# Patient Record
Sex: Female | Born: 1965 | Race: Black or African American | Hispanic: No | Smoking: Never smoker
Health system: Southern US, Community
[De-identification: ages and names within clinical notes are randomized; demographics above are authoritative.]

## PROBLEM LIST (undated history)

## (undated) DIAGNOSIS — I10 Essential (primary) hypertension: Secondary | ICD-10-CM

## (undated) DIAGNOSIS — I071 Rheumatic tricuspid insufficiency: Secondary | ICD-10-CM

## (undated) DIAGNOSIS — I471 Supraventricular tachycardia, unspecified: Secondary | ICD-10-CM

## (undated) DIAGNOSIS — I351 Nonrheumatic aortic (valve) insufficiency: Secondary | ICD-10-CM

## (undated) DIAGNOSIS — K219 Gastro-esophageal reflux disease without esophagitis: Secondary | ICD-10-CM

## (undated) DIAGNOSIS — I340 Nonrheumatic mitral (valve) insufficiency: Secondary | ICD-10-CM

## (undated) HISTORY — PX: DILATION AND CURETTAGE OF UTERUS: SHX78

---

## 1999-07-26 ENCOUNTER — Emergency Department (HOSPITAL_COMMUNITY): Admission: EM | Admit: 1999-07-26 | Discharge: 1999-07-26 | Payer: Self-pay | Admitting: *Deleted

## 1999-08-16 ENCOUNTER — Encounter: Admission: RE | Admit: 1999-08-16 | Discharge: 1999-11-14 | Payer: Self-pay | Admitting: Obstetrics & Gynecology

## 2000-03-11 ENCOUNTER — Inpatient Hospital Stay (HOSPITAL_COMMUNITY): Admission: AD | Admit: 2000-03-11 | Discharge: 2000-03-14 | Payer: Self-pay | Admitting: Obstetrics and Gynecology

## 2000-12-22 ENCOUNTER — Emergency Department (HOSPITAL_COMMUNITY): Admission: EM | Admit: 2000-12-22 | Discharge: 2000-12-22 | Payer: Self-pay | Admitting: Emergency Medicine

## 2003-08-27 ENCOUNTER — Emergency Department (HOSPITAL_COMMUNITY): Admission: EM | Admit: 2003-08-27 | Discharge: 2003-08-28 | Payer: Self-pay

## 2003-09-05 ENCOUNTER — Emergency Department (HOSPITAL_COMMUNITY): Admission: AD | Admit: 2003-09-05 | Discharge: 2003-09-05 | Payer: Self-pay | Admitting: Family Medicine

## 2006-06-09 ENCOUNTER — Inpatient Hospital Stay (HOSPITAL_COMMUNITY): Admission: EM | Admit: 2006-06-09 | Discharge: 2006-06-24 | Payer: Self-pay | Admitting: Emergency Medicine

## 2006-06-09 ENCOUNTER — Ambulatory Visit: Payer: Self-pay | Admitting: Pulmonary Disease

## 2006-07-06 ENCOUNTER — Encounter: Admission: RE | Admit: 2006-07-06 | Discharge: 2006-07-06 | Payer: Self-pay | Admitting: Cardiothoracic Surgery

## 2007-11-03 IMAGING — CR DG CHEST 2V
2 series · 2 of 2 positions shown · non-contrast
Comparison: none

CLINICAL DATA: Cough, fever

Chest 2 view:
No previous for comparison. Dense airspace consolidation in the left lower lobe
and lingula. Can't exclude small effusion. Right lung clear. Heart size
difficult to assess due to adjacent opacities. There is mild S shaped scoliosis
of the thoracolumbar spine.

[w chest pa]
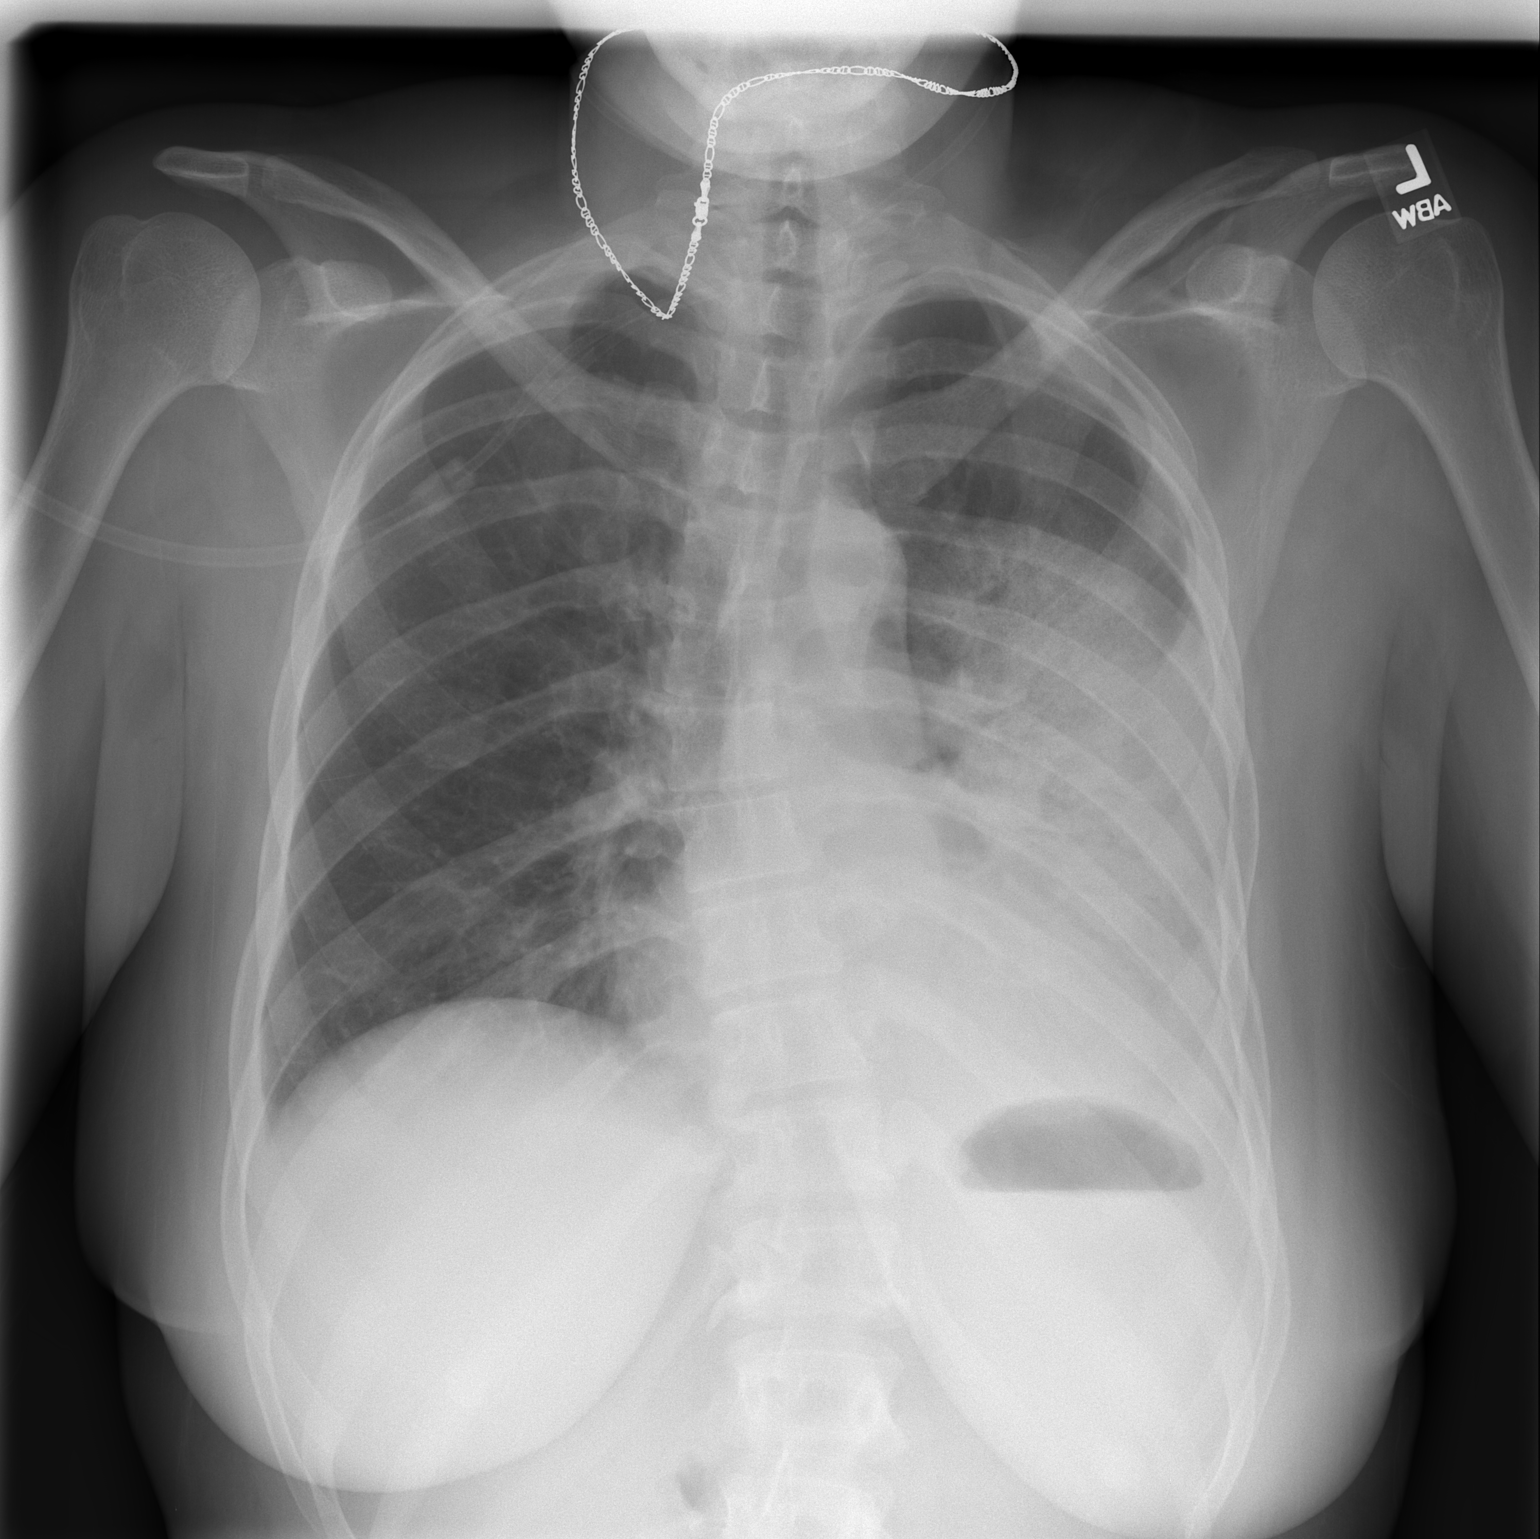

[w chest lat *]
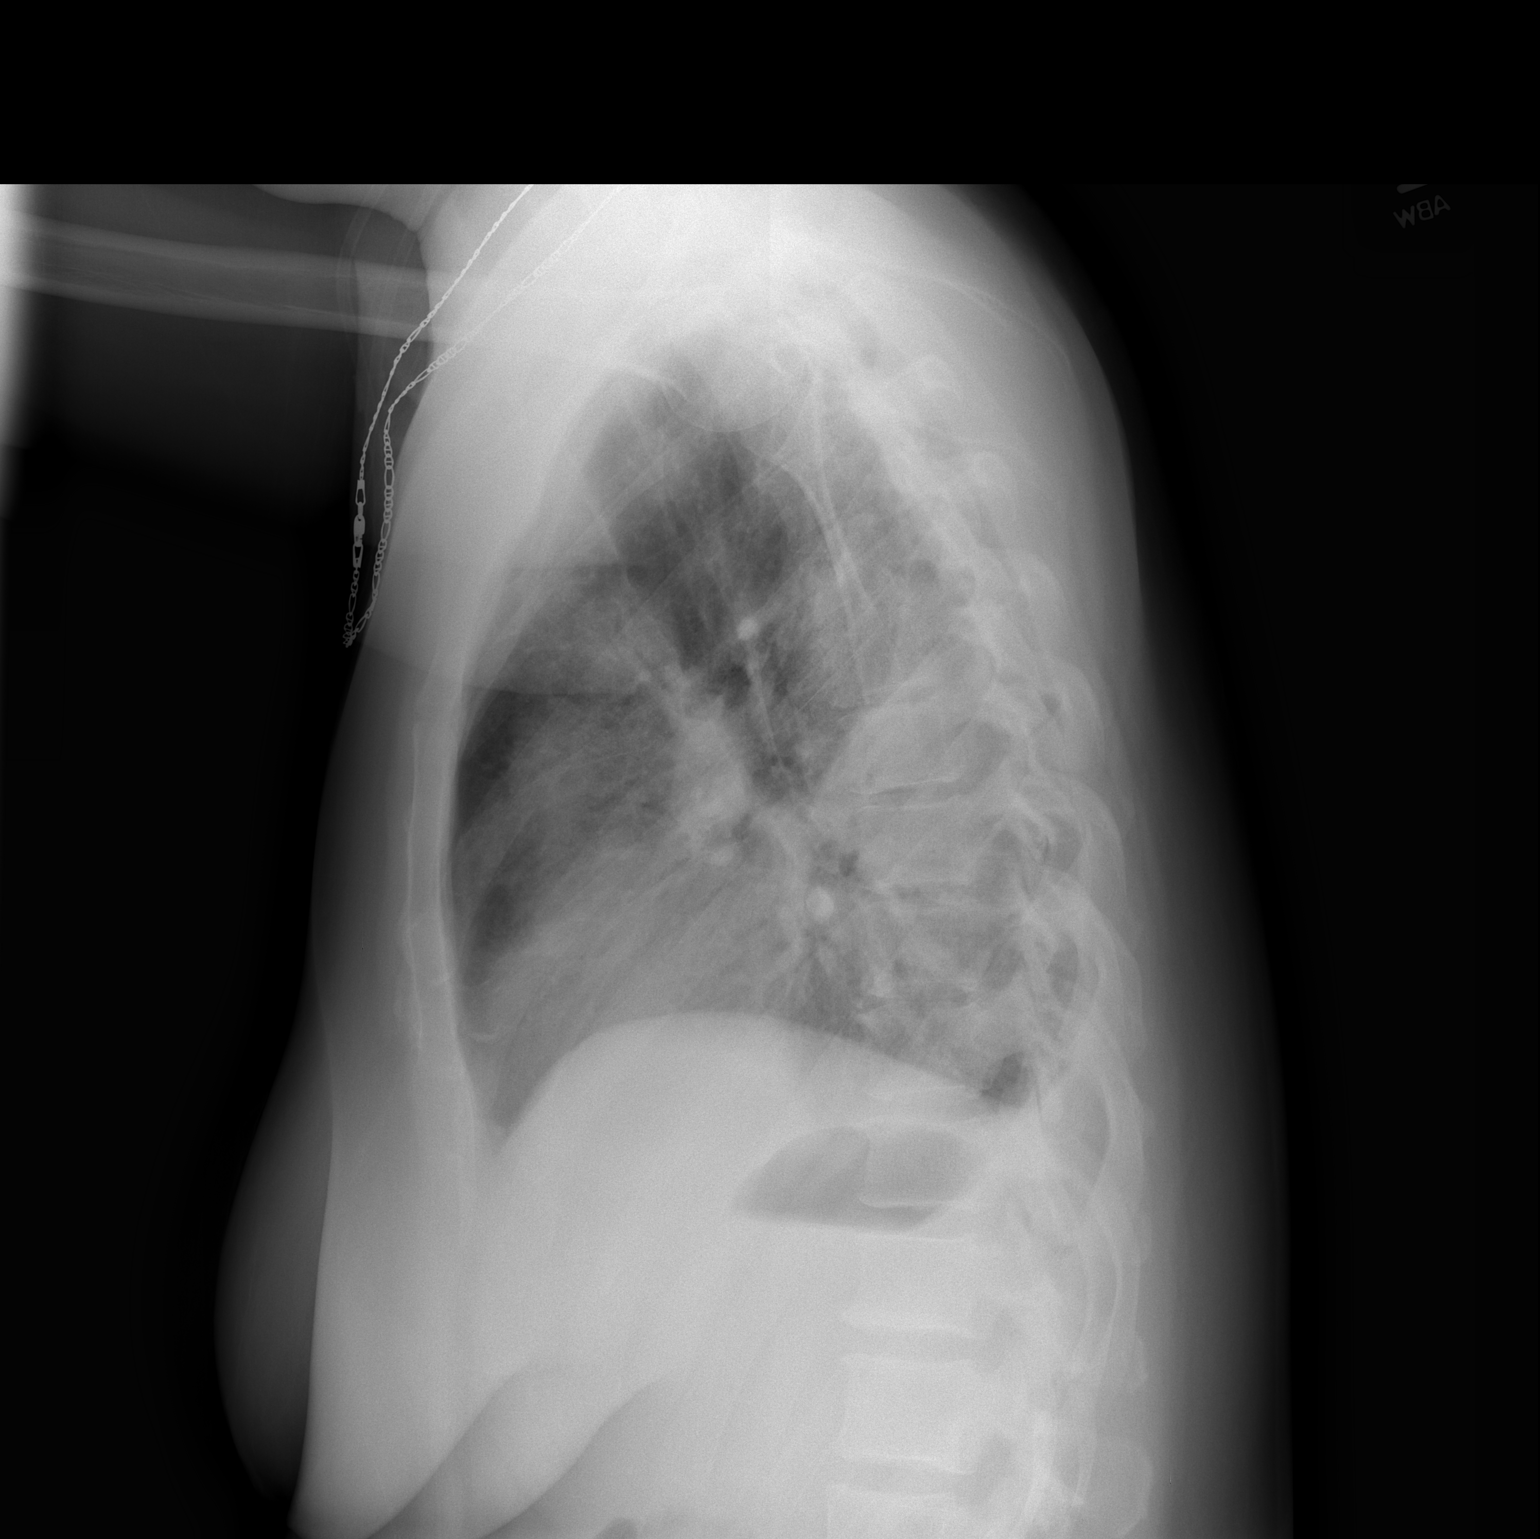

[2 of 2 positions shown; findings below may reference images not displayed]

IMPRESSION: 1. Lingular and left lower lobe pneumonia

## 2007-11-08 IMAGING — CT CT CHEST W/ CM
1 of 2 series · 14 of 31 positions shown, 18 images · IV contrast (omnipaque)
Comparison: none

CLINICAL DATA: Pneumonia, fever, shortness of breath.  Clinical question of empyema.  
CHEST CT WITH CONTRAST:
TECHNIQUE: Multidetector CT imaging of the chest was performed following the standard protocol during bolus administration of intravenous contrast.   
Contrast:  80 cc Omnipaque 300 IV injected through PICC line.

[Series 2: chest_routine 5.0 b40f st · axial · 0.62mm/px · z∈[-314,-9]mm · 14 of 73 slices shown, 18 images]
[im 6/73  mediastinal]
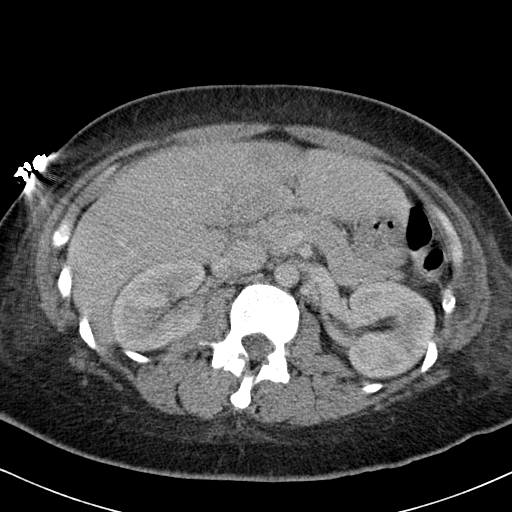
[im 6/73  lung]
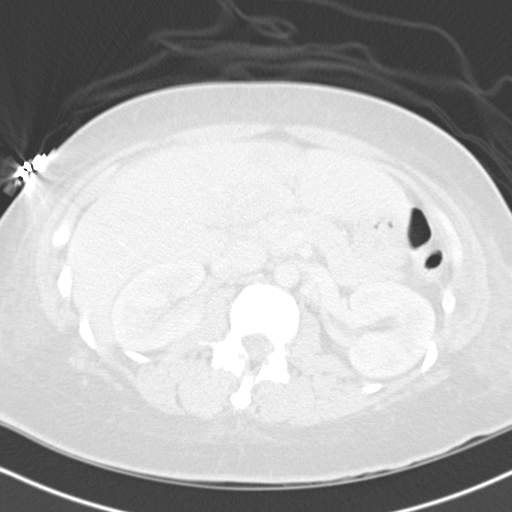
[im 12/73  lung]
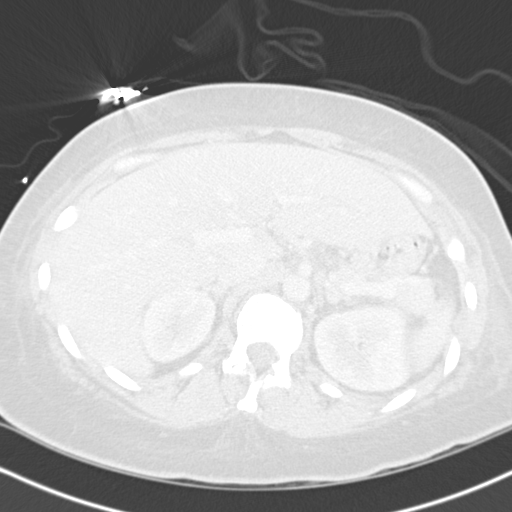
[im 17/73  lung]
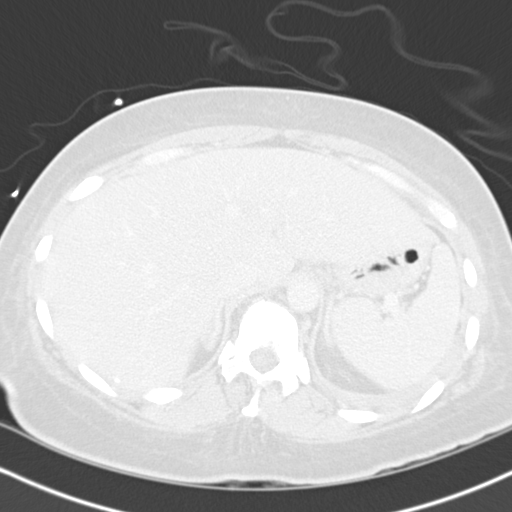
[im 23/73  lung]
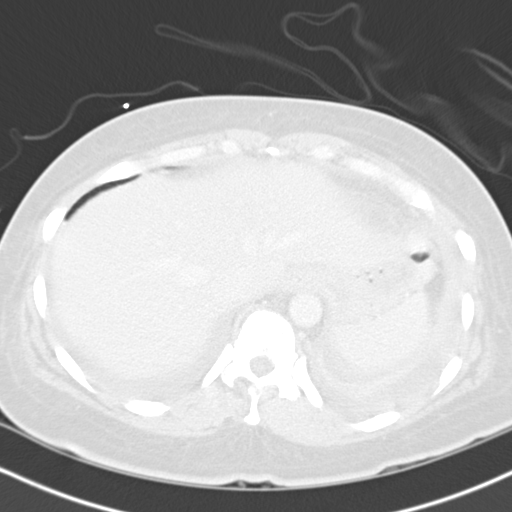
[im 28/73  mediastinal]
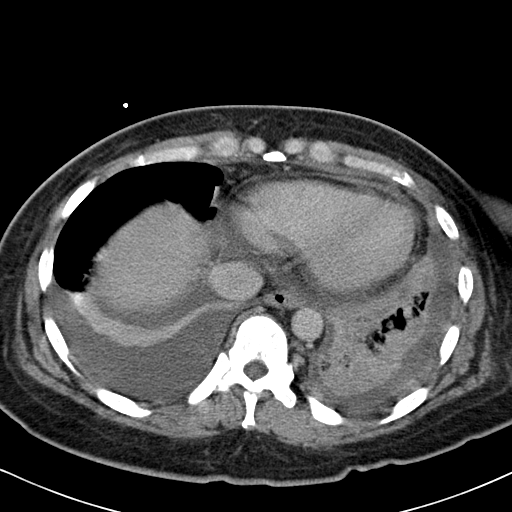
[im 28/73  lung]
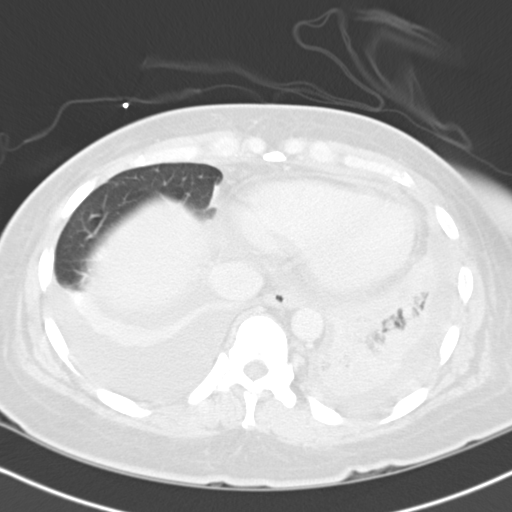
[im 34/73  lung]
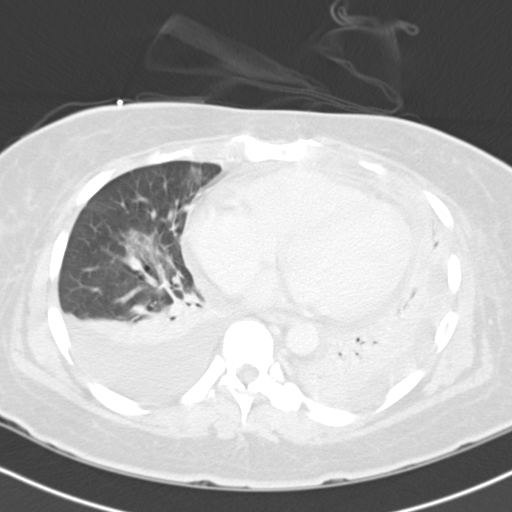
[im 35/73  lung]
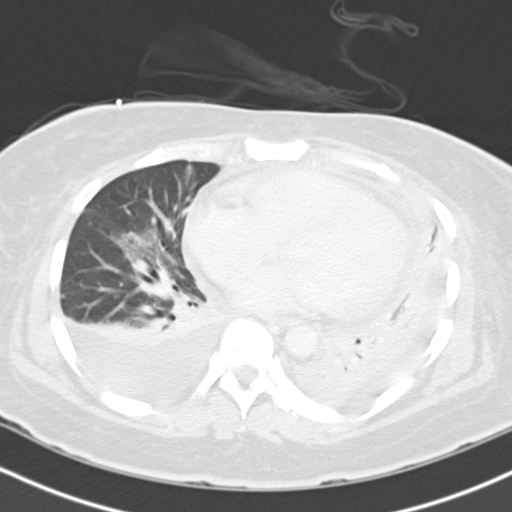
[im 37/73  lung]
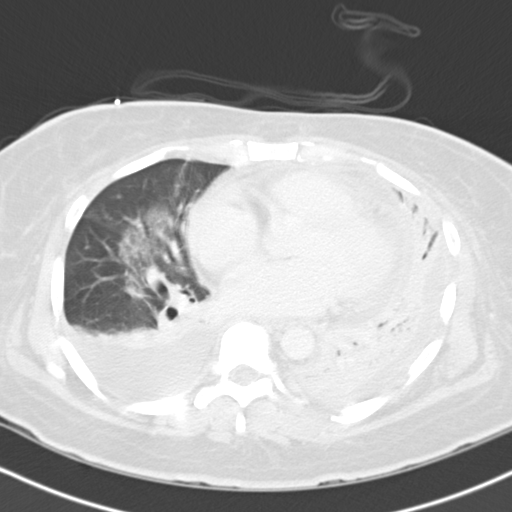
[im 39/73  mediastinal]
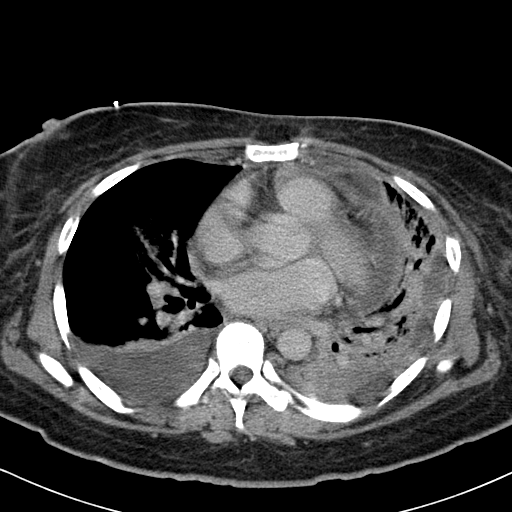
[im 39/73  lung]
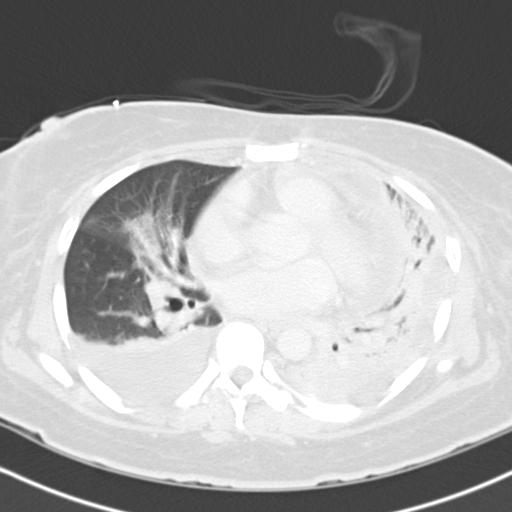
[im 45/73  lung]
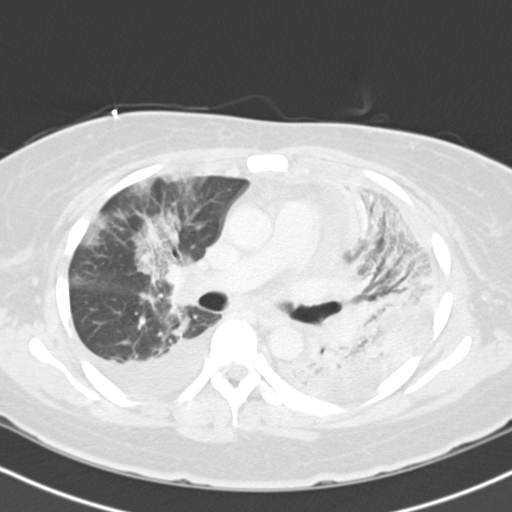
[im 50/73  lung]
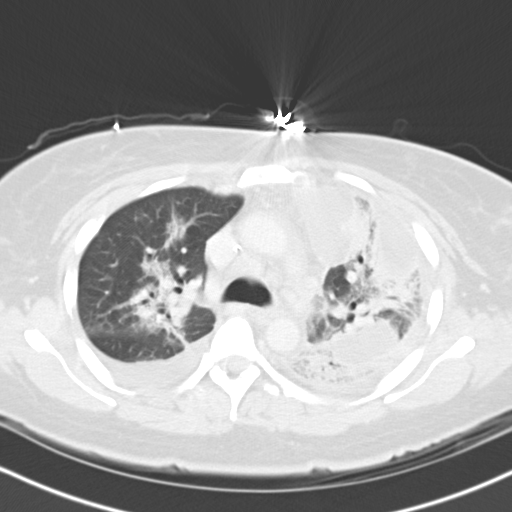
[im 56/73  lung]
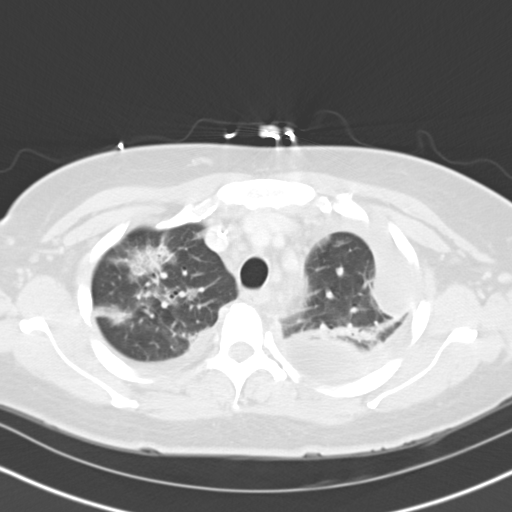
[im 61/73  mediastinal]
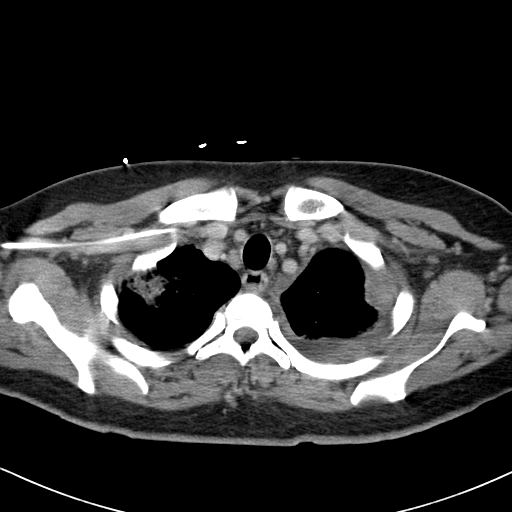
[im 61/73  lung]
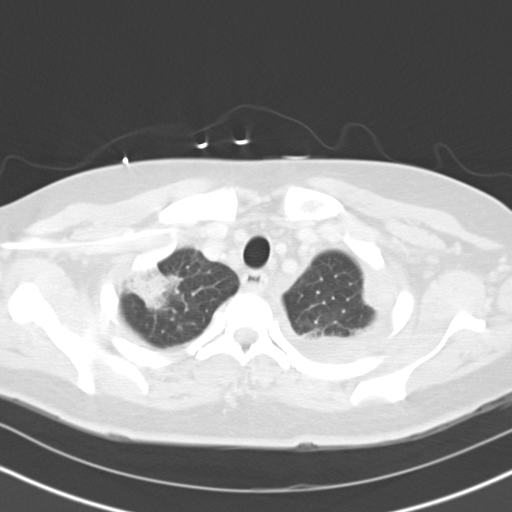
[im 67/73  lung]
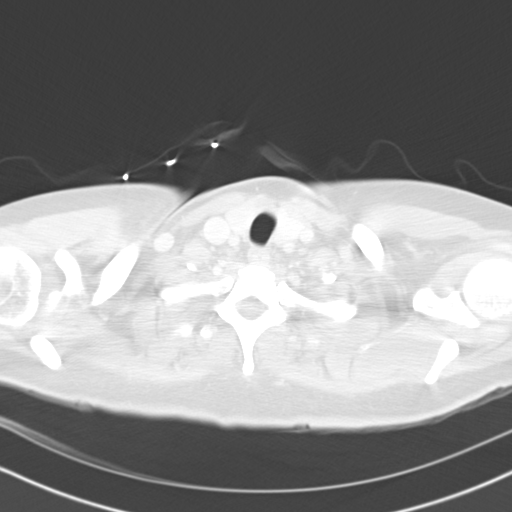

[14 of 31 positions shown; findings below may reference images not displayed]

FINDINGS: There is a small pericardial effusion and bilateral pleural effusions.  There is left lower lobe atelectasis/consolidation with extensive bilateral air space densities compatible with pneumonia predominantly involving the left mid to lower lung zone with consolidation.   Extensive air bronchograms.  Extensive lobulated pleural-based fluid collections in the left chest extending from the apex to the base of the left hemithorax.  I believe that the loculations of fluid communicate.  Findings are suspicious for extensive empyema formation. Slightly enlarged mediastinal and bihilar lymph nodes represents a nonspecific finding. These may represent reactive/hyperplastic nodes.  The central bronchial airways appear patent.  Hepatomegaly.  Edematous changes of the chest wall subcutaneous tissues would be compatible with anasarca.
IMPRESSION: Bilateral pleural effusions and pericardial effusion.
Extensive multiloculated complex left pleural effusion is suspicious for empyema.  
Extensive polylobar pneumonia.  Extensive pneumonic consolidations, particularly in the left lower lobe.
Mediastinal and bihilar adenopathy.

## 2007-11-08 IMAGING — CR DG CHEST 1V PORT
1 series · 1 of 1 positions shown · non-contrast
Comparison: 06/12/2006

CLINICAL DATA: Pneumonia

[view not recorded]
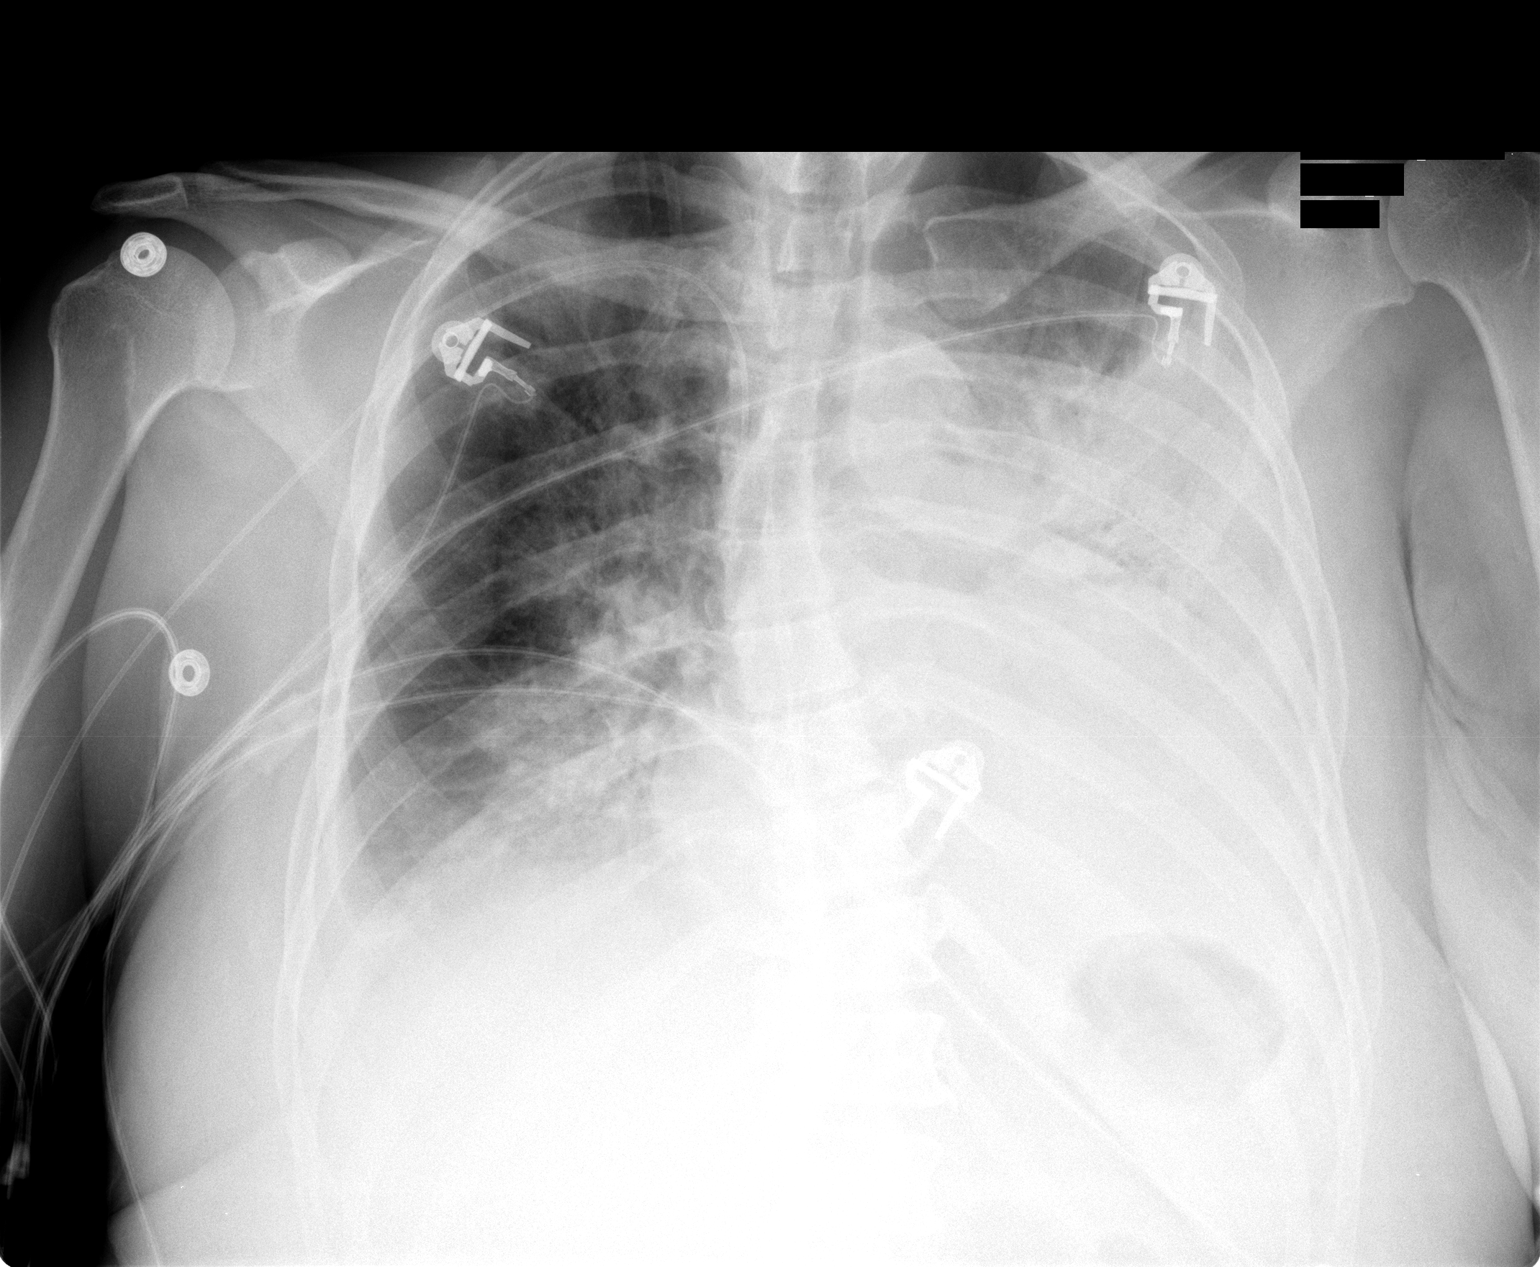

[1 of 1 positions shown; findings below may reference images not displayed]

PORTABLE CHEST - 1 VIEW:

0950 hours. The diffuse airspace disease in the left lung is not substantially
changed. There is associated left pleural effusion. Patchy airspace disease in
the right lung has progressed slightly in the interval. Right PICC line remains
in place.
IMPRESSION: Persistent diffuse left airspace disease with worsening patchy right alveolar
opacity.

Bilateral pleural effusions.

## 2010-10-15 ENCOUNTER — Encounter: Payer: Self-pay | Admitting: Cardiothoracic Surgery

## 2010-10-16 ENCOUNTER — Encounter: Payer: Self-pay | Admitting: *Deleted

## 2010-10-16 ENCOUNTER — Encounter: Payer: Self-pay | Admitting: Cardiothoracic Surgery

## 2021-07-02 ENCOUNTER — Encounter (HOSPITAL_COMMUNITY): Payer: Self-pay | Admitting: Emergency Medicine

## 2021-07-02 ENCOUNTER — Emergency Department (HOSPITAL_COMMUNITY): Payer: Self-pay

## 2021-07-02 ENCOUNTER — Other Ambulatory Visit: Payer: Self-pay

## 2021-07-02 ENCOUNTER — Emergency Department (HOSPITAL_COMMUNITY)
Admission: EM | Admit: 2021-07-02 | Discharge: 2021-07-02 | Disposition: A | Discharge status: Home / Self Care | Payer: Self-pay | Attending: Emergency Medicine | Admitting: Emergency Medicine

## 2021-07-02 DIAGNOSIS — R799 Abnormal finding of blood chemistry, unspecified: Secondary | ICD-10-CM | POA: Insufficient documentation

## 2021-07-02 DIAGNOSIS — I471 Supraventricular tachycardia: Secondary | ICD-10-CM | POA: Insufficient documentation

## 2021-07-02 DIAGNOSIS — Z20822 Contact with and (suspected) exposure to covid-19: Secondary | ICD-10-CM | POA: Insufficient documentation

## 2021-07-02 LAB — CBC WITH DIFFERENTIAL/PLATELET
Abs Immature Granulocytes: 0.13 10*3/uL — ABNORMAL HIGH (ref 0.00–0.07)
Basophils Absolute: 0 10*3/uL (ref 0.0–0.1)
Basophils Relative: 0 %
Eosinophils Absolute: 0 10*3/uL (ref 0.0–0.5)
Eosinophils Relative: 0 %
HCT: 46.5 % — ABNORMAL HIGH (ref 36.0–46.0)
Hemoglobin: 14.6 g/dL (ref 12.0–15.0)
Immature Granulocytes: 1 %
Lymphocytes Relative: 8 %
Lymphs Abs: 1.4 10*3/uL (ref 0.7–4.0)
MCH: 31.3 pg (ref 26.0–34.0)
MCHC: 31.4 g/dL (ref 30.0–36.0)
MCV: 99.6 fL (ref 80.0–100.0)
Monocytes Absolute: 1.4 10*3/uL — ABNORMAL HIGH (ref 0.1–1.0)
Monocytes Relative: 9 %
Neutro Abs: 13.4 10*3/uL — ABNORMAL HIGH (ref 1.7–7.7)
Neutrophils Relative %: 82 %
Platelets: 144 10*3/uL — ABNORMAL LOW (ref 150–400)
RBC: 4.67 MIL/uL (ref 3.87–5.11)
RDW: 15.3 % (ref 11.5–15.5)
WBC: 16.3 10*3/uL — ABNORMAL HIGH (ref 4.0–10.5)
nRBC: 0.3 % — ABNORMAL HIGH (ref 0.0–0.2)

## 2021-07-02 LAB — COMPREHENSIVE METABOLIC PANEL
ALT: 1010 U/L — ABNORMAL HIGH (ref 0–44)
AST: 662 U/L — ABNORMAL HIGH (ref 15–41)
Albumin: 3.2 g/dL — ABNORMAL LOW (ref 3.5–5.0)
Alkaline Phosphatase: 242 U/L — ABNORMAL HIGH (ref 38–126)
Anion gap: 20 — ABNORMAL HIGH (ref 5–15)
BUN: 61 mg/dL — ABNORMAL HIGH (ref 6–20)
CO2: 15 mmol/L — ABNORMAL LOW (ref 22–32)
Calcium: 9 mg/dL (ref 8.9–10.3)
Chloride: 99 mmol/L (ref 98–111)
Creatinine, Ser: 1.95 mg/dL — ABNORMAL HIGH (ref 0.44–1.00)
GFR, Estimated: 30 mL/min — ABNORMAL LOW (ref >60)
Glucose, Bld: 109 mg/dL — ABNORMAL HIGH (ref 70–99)
Potassium: 4.9 mmol/L (ref 3.5–5.1)
Sodium: 134 mmol/L — ABNORMAL LOW (ref 135–145)
Total Bilirubin: 3.4 mg/dL — ABNORMAL HIGH (ref 0.3–1.2)
Total Protein: 6.1 g/dL — ABNORMAL LOW (ref 6.5–8.1)

## 2021-07-02 LAB — RESP PANEL BY RT-PCR (FLU A&B, COVID) ARPGX2
Influenza A by PCR: NEGATIVE
Influenza B by PCR: NEGATIVE
SARS Coronavirus 2 by RT PCR: NEGATIVE

## 2021-07-02 LAB — CBG MONITORING, ED: Glucose-Capillary: 120 mg/dL — ABNORMAL HIGH (ref 70–99)

## 2021-07-02 LAB — I-STAT BETA HCG BLOOD, ED (MC, WL, AP ONLY): I-stat hCG, quantitative: <5 m[IU]/mL (ref <5)

## 2021-07-02 LAB — PROTIME-INR
INR: 1.7 — ABNORMAL HIGH (ref 0.8–1.2)
Prothrombin Time: 20 seconds — ABNORMAL HIGH (ref 11.4–15.2)

## 2021-07-02 LAB — TROPONIN I (HIGH SENSITIVITY): Troponin I (High Sensitivity): 44 ng/L — ABNORMAL HIGH (ref <18)

## 2021-07-02 LAB — MAGNESIUM: Magnesium: 2.8 mg/dL — ABNORMAL HIGH (ref 1.7–2.4)

## 2021-07-02 LAB — BRAIN NATRIURETIC PEPTIDE: B Natriuretic Peptide: 1157.2 pg/mL — ABNORMAL HIGH (ref 0.0–100.0)

## 2021-07-02 MED ORDER — ASPIRIN 81 MG PO CHEW
324.0000 mg | CHEWABLE_TABLET | Freq: Once | ORAL | Status: AC
  Administered 2021-07-02: 324 mg via ORAL
  Filled 2021-07-02: qty 4

## 2021-07-02 NOTE — ED Provider Notes (Signed)
[facility] EMERGENCY DEPARTMENT Provider Note   CSN: [id number] Arrival date & time: 07/02/21  1115     History Chief Complaint  Patient presents with   Shortness of Breath    Lisa Ayala is a 55 y.o. female.  HPI Patient presents with concern of abdominal pain, back pain, nausea, vomiting.  Onset was about 5 days ago.  No obvious precipitant.  She smokes marijuana, does not drink alcohol.  She has no prior similar events.  History is obtained by the patient, and nursing staff.  Patient states that she is generally well, no recent medication change, diet change, activity change.  She has received her COVID vaccines.    History reviewed. No pertinent past medical history.  There are no problems to display for this patient.   History reviewed. No pertinent surgical history.   OB History   No obstetric history on file.     No family history on file.  Social History   Tobacco Use   Smoking status: Never   Smokeless tobacco: Never  Substance Use Topics   Alcohol use: Not Currently   Drug use: Yes    Types: Marijuana    Home Medications Prior to Admission medications   Not on File    Allergies    Patient has no known allergies.  Review of Systems   Review of Systems  Constitutional:        Per HPI, otherwise negative  HENT:         Per HPI, otherwise negative  Respiratory:         Per HPI, otherwise negative  Cardiovascular:        Per HPI, otherwise negative  Gastrointestinal:  Positive for nausea. Negative for vomiting.  Endocrine:       Negative aside from HPI  Genitourinary:        Neg aside from HPI   Musculoskeletal:        Per HPI, otherwise negative  Skin: Negative.   Neurological:  Positive for weakness. Negative for syncope.   Physical Exam Updated Vital Signs BP 139/81   Pulse 85   Temp 98 F (36.7 C)   Resp 20   SpO2 99%   Physical Exam Vitals and nursing note reviewed.  Constitutional:      General: She  is not in acute distress.    Appearance: She is well-developed.  HENT:     Head: Normocephalic and atraumatic.  Eyes:     Conjunctiva/sclera: Conjunctivae normal.  Cardiovascular:     Rate and Rhythm: Normal rate and regular rhythm.  Pulmonary:     Effort: Pulmonary effort is normal. No respiratory distress.     Breath sounds: Normal breath sounds. No stridor. No decreased breath sounds.  Abdominal:     General: There is no distension.  Skin:    General: Skin is warm and dry.  Neurological:     Mental Status: She is alert and oriented to person, place, and time.     Cranial Nerves: No cranial nerve deficit.    ED Results / Procedures / Treatments   Labs (all labs ordered are listed, but only abnormal results are displayed) Labs Reviewed  COMPREHENSIVE METABOLIC PANEL - Abnormal; Notable for the following components:      Result Value   Sodium 134 (*)    CO2 15 (*)    Glucose, Bld 109 (*)    BUN 61 (*)    Creatinine, Ser 1.95 (*)  Total Protein 6.1 (*)    Albumin 3.2 (*)    AST 662 (*)    ALT 1,010 (*)    Alkaline Phosphatase 242 (*)    Total Bilirubin 3.4 (*)    GFR, Estimated 30 (*)    Anion gap 20 (*)    All other components within normal limits  MAGNESIUM - Abnormal; Notable for the following components:   Magnesium 2.8 (*)    All other components within normal limits  BRAIN NATRIURETIC PEPTIDE - Abnormal; Notable for the following components:   B Natriuretic Peptide 1,157.2 (*)    All other components within normal limits  CBC WITH DIFFERENTIAL/PLATELET - Abnormal; Notable for the following components:   WBC 16.3 (*)    HCT 46.5 (*)    Platelets 144 (*)    nRBC 0.3 (*)    Neutro Abs 13.4 (*)    Monocytes Absolute 1.4 (*)    Abs Immature Granulocytes 0.13 (*)    All other components within normal limits  PROTIME-INR - Abnormal; Notable for the following components:   Prothrombin Time 20.0 (*)    INR 1.7 (*)    All other components within normal limits   CBG MONITORING, ED - Abnormal; Notable for the following components:   Glucose-Capillary 120 (*)    All other components within normal limits  TROPONIN I (HIGH SENSITIVITY) - Abnormal; Notable for the following components:   Troponin I (High Sensitivity) 44 (*)    All other components within normal limits  RESP PANEL BY RT-PCR (FLU A&B, COVID) [id number]  I-STAT BETA HCG BLOOD, ED ([facility], [facility], [facility] ONLY)  TROPONIN I (HIGH SENSITIVITY)    EKG EKG Interpretation  Date/Time:  Saturday July 02 2021 11:34:22 EDT Ventricular Rate:  215 PR Interval:    QRS Duration: 122 QT Interval:  204 QTC Calculation: 385 R Axis:   101 Text Interpretation: Supraventricular tachycardia Right bundle branch block Septal infarct , age undetermined Confirmed by Gerhard Munch [phone]) on 07/02/2021 12:53:56 PM  Radiology DG Chest Portable 1 View  Result Date: 07/02/2021 CLINICAL DATA:  Chest pain EXAM: PORTABLE CHEST 1 VIEW COMPARISON:  July 06, 2006 FINDINGS: The heart size and mediastinal contours are stable. The heart size is mildly enlarged. Patchy opacity of medial right lung base noted. Minimal left pleural effusion is identified. The lungs are hyperinflated. The visualized skeletal structures are unremarkable. IMPRESSION: Patchy opacity of medial right lung base, pneumonia is not excluded. Minimal left pleural effusion. Electronically Signed   By: Sherian Rein M.D.   On: 07/02/2021 14:09    Procedures Procedures   Medications Ordered in ED Medications  aspirin chewable tablet 324 mg (324 mg Oral Given 07/02/21 1333)    ED Course  I have reviewed the triage vital signs and the nursing notes.  Pertinent labs & imaging results that were available during my care of the patient were reviewed by me and considered in my medical decision making (see chart for details).  I reviewed the patient's case from triage where she initially presented, was found to have evidence for SVT on EKG.  By the time of my  arrival to the exam room patient had spontaneous cardioversion.  On monitor the patient in sinus rhythm, rate 90s, unremarkable, pulse oximetry 100% room air normal   3:55 PM Patient in no distress, hemodynamically unremarkable.  She remains in sinus rhythm, now 5 hours after conversion.  No increased work of breathing, no oxygen requirement.  We discussed her lab abnormalities,  x-ray.  She notes a history of prior pneumonia, complicated, chart review is clear she had an empyema.  She notes that she has no current cough, no fever, there is no clinical evidence for pneumonia.  BNP elevated, consistent with persistent SVT prior to today, but no new oxygen requirement is reassuring.  We discussed importance of following up with cardiology and I discussed her case with our colleagues.  Patient will be contacted by the clinic for follow-up in the coming days.  Patient comfortable with, appropriate for discharge with close outpatient follow-up for new episode of SVT. MDM Rules/Calculators/A&P MDM Number of Diagnoses or Management Options SVT (supraventricular tachycardia) (HCC): new, needed workup   Amount and/or Complexity of Data Reviewed Clinical lab tests: ordered and reviewed Tests in the radiology section of CPT: ordered and reviewed Tests in the medicine section of CPT: reviewed and ordered Decide to obtain previous medical records or to obtain history from someone other than the patient: yes Review and summarize past medical records: yes Discuss the patient with other providers: yes Independent visualization of images, tracings, or specimens: yes  Risk of Complications, Morbidity, and/or Mortality Presenting problems: high Diagnostic procedures: high Management options: high  Critical Care Total time providing critical care: < 30 minutes  Patient Progress Patient progress: improved   Final Clinical Impression(s) / ED Diagnoses Final diagnoses:  SVT (supraventricular tachycardia)  (HCC)     Gerhard Munch, MD 07/02/21 1556

## 2021-07-02 NOTE — ED Notes (Signed)
Pt verbalizes understanding of discharge instructions. Opportunity for questions and answers were provided. Pt discharged from the ED.   ?

## 2021-07-02 NOTE — Discharge Instructions (Addendum)
Our cardiology colleagues will contact you in the coming days to arrange appropriate outpatient follow-up.  If you develop new, or concerning changes in your condition, do not hesitate to return here.

## 2021-07-02 NOTE — ED Triage Notes (Signed)
C/o SOB x 5 days with generalized abd pain, back pain and vomiting.  Pt hyperventilating on arrival.

## 2021-07-18 ENCOUNTER — Institutional Professional Consult (permissible substitution): Payer: Self-pay | Admitting: Cardiology

## 2021-07-26 ENCOUNTER — Encounter: Payer: Self-pay | Admitting: Cardiology

## 2021-07-26 ENCOUNTER — Ambulatory Visit (INDEPENDENT_AMBULATORY_CARE_PROVIDER_SITE_OTHER): Payer: Self-pay | Admitting: Cardiology

## 2021-07-26 ENCOUNTER — Other Ambulatory Visit: Payer: Self-pay

## 2021-07-26 VITALS — BP 148/76 | HR 88 | Ht 66.0 in | Wt 139.0 lb

## 2021-07-26 DIAGNOSIS — I471 Supraventricular tachycardia: Secondary | ICD-10-CM

## 2021-07-26 DIAGNOSIS — R011 Cardiac murmur, unspecified: Secondary | ICD-10-CM

## 2021-07-26 MED ORDER — METOPROLOL SUCCINATE ER 50 MG PO TB24: 50.0000 mg | ORAL_TABLET | Freq: Every day | ORAL | 3 refills | Status: DC

## 2021-07-26 NOTE — Patient Instructions (Signed)
Medication Instructions:  Your physician has recommended you make the following change in your medication:  START Metoprolol Succinate (Toprol) 50 mg once daily  *If you need a refill on your cardiac medications before your next appointment, please call your pharmacy*   Lab Work: None ordered   Testing/Procedures: Your physician has requested that you have an echocardiogram. Echocardiography is a painless test that uses sound waves to create images of your heart. It provides your doctor with information about the size and shape of your heart and how well your heart's chambers and valves are working. This procedure takes approximately one hour. There are no restrictions for this procedure.   Follow-Up: At [facility], you and your health needs are our priority.  As part of our continuing mission to provide you with exceptional heart care, we have created designated Provider Care Teams.  These Care Teams include your primary Cardiologist (physician) and Advanced Practice Providers (APPs -  Physician Assistants and Nurse Practitioners) who all work together to provide you with the care you need, when you need it.  We recommend signing up for the patient portal called "MyChart".  Sign up information is provided on this After Visit Summary.  MyChart is used to connect with patients for Virtual Visits (Telemedicine).  Patients are able to view lab/test results, encounter notes, upcoming appointments, etc.  Non-urgent messages can be sent to your provider as well.   To learn more about what you can do with MyChart, go to [email or web address].    Your next appointment:   3 month(s)  The format for your next appointment:   In Person  Provider:   Loman Brooklyn, MD    Thank you for choosing [facility]!!   Dory Horn, RN [phone]   Other Instructions   Metoprolol Extended-Release Tablets What is this medication? METOPROLOL (me TOE proe lole) treats high blood pressure  and heart failure. It may also be used to prevent chest pain (angina). It works by lowering your blood pressure and heart rate, making it easier for your heart to pump blood to the rest of your body. It belongs to a group of medications called beta blockers. This medicine may be used for other purposes; ask your health care provider or pharmacist if you have questions. COMMON BRAND NAME(S): toprol, Toprol XL What should I tell my care team before I take this medication? They need to know if you have any of these conditions: Diabetes Heart or vessel disease like slow heart rate, worsening heart failure, heart block, sick sinus syndrome, or Raynaud's disease Kidney disease Liver disease Lung or breathing disease, like asthma or emphysema Pheochromocytoma Thyroid disease An unusual or allergic reaction to metoprolol, other beta blockers, medications, foods, dyes, or preservatives Pregnant or trying to get pregnant Breast-feeding How should I use this medication? Take this medication by mouth. Take it as directed on the prescription label at the same time every day. Take it with food. You may cut the tablet in half if it is scored (has a line in the middle of it). This may help you swallow the tablet if the whole tablet is too big. Be sure to take both halves. Do not take just one-half of the tablet. Keep taking it unless your care team tells you to stop. Talk to your care team about the use of this medication in children. While it may be prescribed for children as young as 6 years for selected conditions, precautions do apply. Overdosage: If you think you  have taken too much of this medicine contact a poison control center or emergency room at once. NOTE: This medicine is only for you. Do not share this medicine with others. What if I miss a dose? If you miss a dose, take it as soon as you can. If it is almost time for your next dose, take only that dose. Do not take double or extra doses. What  may interact with this medication? This medication may interact with the following: Certain medications for blood pressure, heart disease, irregular heartbeat Certain medications for depression, like monoamine oxidase (MAO) inhibitors, fluoxetine, or paroxetine Clonidine Dobutamine Epinephrine Isoproterenol Reserpine This list may not describe all possible interactions. Give your health care provider a list of all the medicines, herbs, non-prescription drugs, or dietary supplements you use. Also tell them if you smoke, drink alcohol, or use illegal drugs. Some items may interact with your medicine. What should I watch for while using this medication? Visit your care team for regular checks on your progress. Check your blood pressure as directed. Ask your care team what your blood pressure should be. Also, find out when you should contact them. Do not treat yourself for coughs, colds, or pain while you are using this medication without asking your care team for advice. Some medications may increase your blood pressure. You may get drowsy or dizzy. Do not drive, use machinery, or do anything that needs mental alertness until you know how this medication affects you. Do not stand up or sit up quickly, especially if you are an older patient. This reduces the risk of dizzy or fainting spells. Alcohol may interfere with the effect of this medication. Avoid alcoholic drinks. This medication may increase blood sugar. Ask your care team if changes in diet or medications are needed if you have diabetes. What side effects may I notice from receiving this medication? Side effects that you should report to your care team as soon as possible: Allergic reactions-skin rash, itching, hives, swelling of the face, lips, tongue, or throat Heart failure-shortness of breath, swelling of the ankles, feet, or hands, sudden weight gain, unusual weakness or fatigue Low blood pressure-dizziness, feeling faint or  lightheaded, blurry vision Raynaud's-cool, numb, or painful fingers or toes that may change color from pale, to blue, to red Slow heartbeat-dizziness, feeling faint or lightheaded, confusion, trouble breathing, unusual weakness or fatigue Worsening mood, feelings of depression Side effects that usually do not require medical attention (report to your care team if they continue or are bothersome): Change in sex drive or performance Diarrhea Dizziness Fatigue Headache This list may not describe all possible side effects. Call your doctor for medical advice about side effects. You may report side effects to FDA at 1-800-FDA-1088. Where should I keep my medication? Keep out of the reach of children and pets. Store at room temperature between 20 and 25 degrees C (68 and 77 degrees F). Throw away any unused medication after the expiration date. NOTE: This sheet is a summary. It may not cover all possible information. If you have questions about this medicine, talk to your doctor, pharmacist, or health care provider.  2022 Elsevier/Gold Standard (2020-10-14 12:55:47)

## 2021-07-26 NOTE — Progress Notes (Signed)
Electrophysiology Office Note   Date:  07/26/2021   ID:  Lisa Ayala, DOB 06/14/1966, MRN [id number]  PCP:  Pcp, No  Cardiologist:   Primary Electrophysiologist:  Lisa Illes Jorja Loa, MD    Chief Complaint: SVT   History of Present Illness: Lisa Ayala is a 55 y.o. female who is being seen today for the evaluation of SVT at the request of No ref. provider found. Presenting today for electrophysiology evaluation.  She presented emergency room 07/02/2021 with abdominal pain, back pain.  In triage, she was found to be in SVT with heart rate of 215 bpm.  She h converted spontaneously while in the emergency room.  Today, she denies symptoms of , chest pain, shortness of breath, orthopnea, PND, lower extremity edema, claudication, dizziness, presyncope, syncope, bleeding, or neurologic sequela. The patient is tolerating medications without difficulties.  The patient has intermittent palpitations since her emergency room visit.  She has some shortness of breath associated with these palpitations.  When she is not having palpitation she feels well.  There are no exacerbating or alleviating symptoms.  She does have to take deep breaths when her heart is racing.   History reviewed. No pertinent past medical history. History reviewed. No pertinent surgical history.   Current Outpatient Medications  Medication Sig Dispense Refill   aspirin EC 81 MG tablet Take 81 mg by mouth daily. Swallow whole.     metoprolol succinate (TOPROL-XL) 50 MG 24 hr tablet Take 1 tablet (50 mg total) by mouth daily. Take with or immediately following a meal. 30 tablet 3   No current facility-administered medications for this visit.    Allergies:   Patient has no known allergies.   Social History:  The patient  reports that she has never smoked. She has never used smokeless tobacco. She reports that she does not currently use alcohol. She reports current drug use. Drug: Marijuana.   Family History:  The  patient's Family history is unknown by patient.    ROS:  Please see the history of present illness.   Otherwise, review of systems is positive for none.   All other systems are reviewed and negative.    PHYSICAL EXAM: VS:  BP (!) 148/76   Pulse 88   Ht 5\' 6"  (1.676 m)   Wt 139 lb (63 kg)   SpO2 98%   BMI 22.44 kg/m  , BMI Body mass index is 22.44 kg/m. GEN: Well nourished, well developed, in no acute distress  HEENT: normal  Neck: no JVD, carotid bruits, or masses Cardiac: RRR; 2 out of 6 stock murmur at the base, no rubs, or gallops,no edema  Respiratory:  clear to auscultation bilaterally, normal work of breathing GI: soft, nontender, nondistended, + BS MS: no deformity or atrophy  Skin: warm and dry Neuro:  Strength and sensation are intact Psych: euthymic mood, full affect  EKG:  EKG is not ordered today. Personal review of the ekg ordered 07/02/21 shows SVT, rate to 50  Recent Labs: 07/02/2021: ALT 1,010; B Natriuretic Peptide 1,157.2; BUN 61; Creatinine, Ser 1.95; Hemoglobin 14.6; Magnesium 2.8; Platelets 144; Potassium 4.9; Sodium 134    Lipid Panel  No results found for: CHOL, TRIG, HDL, CHOLHDL, VLDL, LDLCALC, LDLDIRECT   Wt Readings from Last 3 Encounters:  07/26/21 139 lb (63 kg)      Other studies Reviewed: Additional studies/ records that were reviewed today include: ER notes   ASSESSMENT AND PLAN:  1.  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  Her SVT is wide-complex, but I do feel that this is due to aberrancy.  We spoke about ablation versus medical management.  She would like to avoid procedures at the moment.  We Zane Samson start her on Toprol-XL 50 mg.  2.  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  She is minimally symptomatic.  Due to the murmur, we Elise Knobloch order an echo.    Current medicines are reviewed at length with the patient today.   The patient does not have concerns regarding her medicines.  The following changes were made today: Start  Toprol-XL  Labs/ tests ordered today include: He is here Orders Placed This Encounter  Procedures   ECHOCARDIOGRAM COMPLETE      Disposition:   FU with Lisa Ayala 3 months  Signed, Lisa Ayala Jorja Loa, MD  07/26/2021 11:41 AM     [facility] [address] [place] [address] [phone] (office) [phone] (fax)

## 2021-07-27 ENCOUNTER — Telehealth: Payer: Self-pay | Admitting: Cardiology

## 2021-07-27 ENCOUNTER — Other Ambulatory Visit: Payer: Self-pay

## 2021-07-27 MED ORDER — METOPROLOL SUCCINATE ER 50 MG PO TB24: 50.0000 mg | ORAL_TABLET | Freq: Every day | ORAL | 3 refills | Status: DC

## 2021-07-27 NOTE — Telephone Encounter (Signed)
Pt c/o medication issue:  1. Name of Medication: metoprolol succinate (TOPROL-XL) 50 MG 24 hr tablet    2. How are you currently taking this medication (dosage and times per day)?  Take 1 tablet (50 mg total) by mouth daily. Take with or immediately following a meal.    3. Are you having a reaction (difficulty breathing--STAT)? no  4. What is your medication issue? Pt says that the [facility] pharmacy on [address] where rx was sent originally is currently closed... will need this sent to [id number] [address] [place], [place] [address] [phone]  .

## 2021-08-25 ENCOUNTER — Ambulatory Visit (HOSPITAL_COMMUNITY): Payer: Self-pay | Attending: Cardiology

## 2021-08-25 ENCOUNTER — Other Ambulatory Visit: Payer: Self-pay

## 2021-08-25 DIAGNOSIS — R011 Cardiac murmur, unspecified: Secondary | ICD-10-CM

## 2021-08-25 LAB — ECHOCARDIOGRAM COMPLETE
Area-P 1/2: 3.6 cm2
MV M vel: 6.39 m/s
MV Peak grad: 163.2 mmHg
MV VTI: 0.66 cm2
P 1/2 time: 317 msec
S' Lateral: 3.4 cm

## 2021-09-01 NOTE — Progress Notes (Signed)
   PCP:  Pcp, No Primary Cardiologist: None Electrophysiologist: Will Jorja Loa, MD   Lisa Ayala is a 55 y.o. female seen today for Will Jorja Loa, MD for routine electrophysiology followup.  Since last being seen in our clinic the patient reports doing about the same. Remains SOB with more than moderate exertion and overall is fatigued.  she denies chest pain, palpitations, PND, orthopnea, nausea, vomiting, dizziness, syncope, edema, weight gain, or early satiety.  Past Medical History SVT  Surgical History Denies  Current Outpatient Medications  Medication Sig Dispense Refill   aspirin EC 81 MG tablet Take 81 mg by mouth daily. Swallow whole.     metoprolol succinate (TOPROL-XL) 50 MG 24 hr tablet Take 1 tablet (50 mg total) by mouth daily. Take with or immediately following a meal. 30 tablet 3   No current facility-administered medications for this visit.    No Known Allergies  Social History   Socioeconomic History   Marital status: Divorced    Spouse name: Not on file   Number of children: Not on file   Years of education: Not on file   Highest education level: Not on file  Occupational History   Not on file  Tobacco Use   Smoking status: Never   Smokeless tobacco: Never  Substance and Sexual Activity   Alcohol use: Not Currently   Drug use: Yes    Types: Marijuana   Sexual activity: Not on file  Other Topics Concern   Not on file  Social History Narrative   Not on file   Social Determinants of Health   Financial Resource Strain: Not on file  Food Insecurity: Not on file  Transportation Needs: Not on file  Physical Activity: Not on file  Stress: Not on file  Social Connections: Not on file  Intimate Partner Violence: Not on file     Review of Systems: All other systems reviewed and are otherwise negative except as noted above.  Physical Exam: GEN- The patient is well appearing, alert and oriented x 3 today.   HEENT: normocephalic,  atraumatic; sclera clear, conjunctiva pink; hearing intact; oropharynx clear; neck supple, no JVP Lymph- no cervical lymphadenopathy Lungs- Clear to ausculation bilaterally, normal work of breathing.  No wheezes, rales, rhonchi Heart- Regular rate and rhythm, no murmurs, rubs or gallops, PMI not laterally displaced GI- soft, non-tender, non-distended, bowel sounds present, no hepatosplenomegaly Extremities- no clubbing, cyanosis, or edema; DP/PT/radial pulses 2+ bilaterally MS- no significant deformity or atrophy Skin- warm and dry, no rash or lesion Psych- euthymic mood, full affect Neuro- strength and sensation are intact  EKG is not ordered.   Additional studies reviewed include: Previous EP office notes.  Echo as below.  Assessment and Plan:  1.  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   Her SVT is wide-complex, but Dr. Elberta Fortis does feel that this is due to aberrancy.  We spoke about ablation versus medical management.   Continue Toprol-XL 50 mg.   2.  Multivalvular disease Echo 08/25/21 showed LVEF 70-75%, with severe MR, mild MS, at least Mod TR, and at least mild/mod AI.  Will plan TEE to further quantify, and make appropriate referral pending results. Re-assurance given that with her EF currently is compensating and only minimal signs of HF.  Follow up pending TEE.   Graciella Freer, PA-C  09/02/21 8:57 AM

## 2021-09-02 ENCOUNTER — Ambulatory Visit (INDEPENDENT_AMBULATORY_CARE_PROVIDER_SITE_OTHER): Payer: Self-pay | Admitting: Student

## 2021-09-02 ENCOUNTER — Encounter: Payer: Self-pay | Admitting: Student

## 2021-09-02 ENCOUNTER — Other Ambulatory Visit: Payer: Self-pay

## 2021-09-02 DIAGNOSIS — I471 Supraventricular tachycardia: Secondary | ICD-10-CM

## 2021-09-02 DIAGNOSIS — I340 Nonrheumatic mitral (valve) insufficiency: Secondary | ICD-10-CM

## 2021-09-02 DIAGNOSIS — R011 Cardiac murmur, unspecified: Secondary | ICD-10-CM

## 2021-09-02 LAB — BASIC METABOLIC PANEL
BUN/Creatinine Ratio: 20 (ref 9–23)
BUN: 19 mg/dL (ref 6–24)
CO2: 25 mmol/L (ref 20–29)
Calcium: 9.5 mg/dL (ref 8.7–10.2)
Chloride: 109 mmol/L — ABNORMAL HIGH (ref 96–106)
Creatinine, Ser: 0.96 mg/dL (ref 0.57–1.00)
Glucose: 101 mg/dL — ABNORMAL HIGH (ref 70–99)
Potassium: 5.2 mmol/L (ref 3.5–5.2)
Sodium: 140 mmol/L (ref 134–144)
eGFR: 69 mL/min/{1.73_m2} (ref >59)

## 2021-09-02 LAB — CBC
Hematocrit: 34.3 % (ref 34.0–46.6)
Hemoglobin: 11.2 g/dL (ref 11.1–15.9)
MCH: 28.8 pg (ref 26.6–33.0)
MCHC: 32.7 g/dL (ref 31.5–35.7)
MCV: 88 fL (ref 79–97)
Platelets: 296 10*3/uL (ref 150–450)
RBC: 3.89 x10E6/uL (ref 3.77–5.28)
RDW: 14.9 % (ref 11.7–15.4)
WBC: 9 10*3/uL (ref 3.4–10.8)

## 2021-09-02 NOTE — Patient Instructions (Signed)
  You are scheduled for a TEE on 09/22/2021 with Dr. Rennis Golden.  Please arrive at the [facility] (Main Entrance A) at [facility]: [address] [place], [place] [address] at 7:00 am.   DIET: Nothing to eat or drink after midnight except a sip of water with medications (see medication instructions below)  FYI: For your safety, and to allow [place] to monitor your vital signs accurately during the surgery/procedure we request that   if you have artificial nails, gel coating, SNS etc. Please have those removed prior to your surgery/procedure. Not having the nail coverings /polish removed may result in cancellation or delay of your surgery/procedure.   Medication Instructions: You may take your medications that morning.   Labs: TODAY: BMET, CBC  You must have a responsible person to drive you home and stay in the waiting area during your procedure. Failure to do so could result in cancellation.  Bring your insurance cards.  *Special Note: Every effort is made to have your procedure done on time. Occasionally there are emergencies that occur at the hospital that may cause delays. Please be patient if a delay does occur.

## 2021-09-14 ENCOUNTER — Encounter (HOSPITAL_COMMUNITY): Payer: Self-pay | Admitting: Internal Medicine

## 2021-09-28 ENCOUNTER — Encounter (HOSPITAL_COMMUNITY): Payer: Self-pay | Admitting: Certified Registered Nurse Anesthetist

## 2021-09-28 ENCOUNTER — Ambulatory Visit (HOSPITAL_COMMUNITY): Admission: RE | Admit: 2021-09-28 | Payer: Self-pay | Source: Home / Self Care | Admitting: Internal Medicine

## 2021-09-28 SURGERY — ECHOCARDIOGRAM, TRANSESOPHAGEAL
Anesthesia: Monitor Anesthesia Care

## 2021-10-04 ENCOUNTER — Telehealth: Payer: Self-pay | Admitting: Cardiovascular Disease

## 2021-10-04 NOTE — Telephone Encounter (Signed)
Patient states that her surgery was cancelled and a new date hasn't be scheduled.  She is wondering if her appt for tomorrow is necessary.

## 2021-10-04 NOTE — Telephone Encounter (Signed)
I spoke with patient and TEE was canceled because she had transportation issues.  She will come in for appointment tomorrow with Dr Excell Seltzer as planned.

## 2021-10-05 ENCOUNTER — Institutional Professional Consult (permissible substitution): Payer: Self-pay | Admitting: Cardiovascular Disease

## 2021-10-25 ENCOUNTER — Ambulatory Visit: Payer: Self-pay | Admitting: Cardiology

## 2021-10-27 ENCOUNTER — Telehealth: Payer: Self-pay

## 2021-10-27 NOTE — Telephone Encounter (Signed)
The patient no-showed to her TEE, appointment with Dr. Burt Knack (after confirming the day before), and her appointment with Dr. Curt Bears.   Called to check on the patient and schedule follow-up if she desires. Her daughter answered and stated the patient had been waiting for appointments and no one ever called to schedule them.   Instructed the patient's daughter to speak with the patient and have her call back to confirm if she would like to reschedule.

## 2021-11-09 NOTE — Telephone Encounter (Signed)
Attempted again to call patient at both numbers on file. °  °One number had a voicemail that had not been set up yet and the other kept ringing and ringing. Unable to leave message. °  °Will try again later. °

## 2021-11-17 NOTE — Telephone Encounter (Signed)
Attempted again to call patient at both numbers on file.   One number had a voicemail that had not been set up yet and the other kept ringing and ringing. Unable to leave message.   Will try again later.

## 2021-11-20 ENCOUNTER — Other Ambulatory Visit: Payer: Self-pay | Admitting: Cardiology

## 2021-11-21 NOTE — Telephone Encounter (Signed)
After several failed attempts to contact the patient, letter mailed with instruction to call the office to arrange an appointment.

## 2022-02-05 NOTE — Progress Notes (Signed)
? ?Cardiology Office Note ?Date:  02/05/2022  ?Patient ID:  Lisa Ayala, Lisa Ayala 06/02/1966, MRN [id number] ?PCP:  Pcp, No  ?Electrophysiologist: Dr. Curt Bears ? ? ?  ?Chief Complaint:  3 mo ? ?History of Present Illness: ?Lisa Ayala is a 56 y.o. female with history of SVT ? ?She comes in today to be seen for Dr. Curt Bears, last seen by him Nov 2022 in consult after an ER visit with an SVT ?Was a WCT felt to be aberrancy ?Suspect to be an AVNRT, discussed management options ablation, meds.  She preferred a more conservative strategy and started on Toprol. ?Noted to have SM on exam and planned for an echo ? ?F/u with A. Chalmers Cater, PA-C Dec 2022, DOE with more then moderate activities, overall fatigued.  TTE noted LVEF 70-75% with severe MR, mild MS, at least Mod TR and planned for TEE ? ?TEE was canceled 2/2 no ride ?NO showed to appt with Dr. Burt Knack and Dr. Curt Bears ?Seems telephone contact has been difficult to establish with her ? ?TODAY ?She comes today accompanied by her daughter. ?She reports that she has done well since her last visit until her birthday. ?5/5 she had a few drinks that is unsual for her and a day or 2 later started feeling her heart fast intermittently making her SOB ?These symptoms are linked. ?She has ahad this on/off since then. ?Her daughter noticed that when sleeping she would seem to be resting quitely for about 50 seconds then rapid breathing for 20 seconds, and this would last for a few minutes. ? ?Her observation of her tachycardia is typically brief in duration but has happened several times since about the 7th. ?HR when going fast usually they fond 160's ? ?She has noticed since about Saturday that walking up the stairs made her feels winded, without a sense of racing hart beat. ?She has felt well the last 2 days otherwise, has not felt like her heart has been racing. ? ? ?She has not had any CP ?No near syncope or syncope. ? ?For very unclear reasons she has been taking the ASA 4  tablets TID and her Toprol correctly one per day ?She denies regular ETOH ?+ for regular marijuana ?No other drugs ? ? ?Current Outpatient Medications  ?Medication Sig Dispense Refill  ? aspirin EC 81 MG tablet Take 81 mg by mouth daily. Swallow whole.    ? metoprolol succinate (TOPROL-XL) 50 MG 24 hr tablet TAKE 1 TABLET BY MOUTH DAILY. TAKE WITH OR IMMEDIATELY FOLLOWING A MEAL. 90 tablet 3  ? ?No current facility-administered medications for this visit.  ? ? ?Allergies:   Patient has no known allergies.  ? ?Social History:  The patient  reports that she has never smoked. She has never used smokeless tobacco. She reports that she does not currently use alcohol. She reports current drug use. Drug: Marijuana.  ? ?Family History:  The patient's Family history is unknown by patient. ? ?ROS:  Please see the history of present illness.    ?All other systems are reviewed and otherwise negative.  ? ?PHYSICAL EXAM:  ?VS:  There were no vitals taken for this visit. BMI: There is no height or weight on file to calculate BMI. ?Well nourished, well developed, in no acute distress ?HEENT: normocephalic, atraumatic ?Neck: no JVD, carotid bruits or masses ?Cardiac:  RRR; tachycardic, 2/6SM, no rubs, or gallops ?Lungs:  CTA b/l, no wheezing, rhonchi or rales ?Abd: soft, nontender ?MS: no deformity or atrophy ?Ext: no  edema ?Skin: warm and dry, no rash ?Neuro:  No gross deficits appreciated ?Psych: euthymic mood, full affect ? ? ? ?EKG:  done today and reviewed by myself ?Reviewed with Dr. Quentin Ore ?Suspect and SVT, though can not r/o Aflutter 145bpm ? ? ?08/25/2021: TTE ? 1. The patient has rheumatic heart disease affecting the mitral,  ?tricuspid and aortic valves. There is severe mitral regurgitation, mild  ?mitral stenosis, at least moderate tricuspid regurgitation, and at least  ?mild-to-moderate aortic regurgitation.  ?Recommend TEE for further work-up as patient likely needs multivalve  ?surgical intervention.  ? 2. Left  ventricular ejection fraction, by estimation, is 70 to 75%. Left  ?ventricular ejection fraction by 3D volume is 73 %. The left ventricle has  ?normal function. The left ventricle has no regional wall motion  ?abnormalities. The left ventricular  ?internal cavity size was moderately dilated. Left ventricular diastolic  ?parameters are consistent with Grade II diastolic dysfunction  ?(pseudonormalization). Elevated left atrial pressure. The average left  ?ventricular global longitudinal strain is -23.8  ?%. The global longitudinal strain is normal.  ? 3. Right ventricular systolic function is normal. The right ventricular  ?size is mildly enlarged.  ? 4. Left atrial size was massively dilated.  ? 5. Right atrial size was mild to moderately dilated.  ? 6. A small pericardial effusion is present. The pericardial effusion is  ?posterior to the left ventricle.  ? 7. The mitral valve is rheumatic with severe leaflet thickening and  ?restricted leaflet motion. There is severe mitral valve regurgitation due  ?to lack of central coaptation of the mitral valve leaflets. Mild mitral  ?stenosis.  ? 8. The tricuspid valve is rheumatic. Tricuspid valve regurgitation is at  ?least moderate. No reversal of flow seen in the hepatic veins.  ? 9. The aortic valve is rheumatic. There is moderate thickening of the  ?aortic valve leaflets. Aortic valve regurgitation is mild to moderate. No  ?aortic stenosis is present.  ?10. The inferior vena cava is normal in size with greater than 50%  ?respiratory variability, suggesting right atrial pressure of 3 mmHg.  ? ?Recent Labs: ?07/02/2021: ALT 1,010; B Natriuretic Peptide 1,157.2; Magnesium 2.8 ?09/02/2021: BUN 19; Creatinine, Ser 0.96; Hemoglobin 11.2; Platelets 296; Potassium 5.2; Sodium 140  ?No results found for requested labs within last 8760 hours.  ? ?CrCl cannot be calculated (Patient's most recent lab result is older than the maximum 21 days allowed.).  ? ?Wt Readings from Last 3  Encounters:  ?07/26/21 139 lb (63 kg)  ?  ? ?Other studies reviewed: ?Additional studies/records reviewed today include: summarized above ? ?ASSESSMENT AND PLAN: ? ?SVT ?? AFlutter today ? ?Unclear duration of current rhythm, she has been SOB walking up the stairs a few days but has not had awareness of her HR or her "usual SOB" she associates with her tachycardia for the last 2 days, including right now, she was unaware of her fast HR  ? ?I discussed with the patient and her daughter today her EKG and recommend that she go to the ER ?Discussed that if indeed is an AFlutter we would approach that differently the an SVT in treatment and knowing is important. ?The patient and her daughter especially understand and are agreeable to go to the ER from here. ? ?I have let our cardiology card master know the patient was coming over as well at ER triage nurse. ? ?In d/w Dr. Quentin Ore his review of the EKG, recommends the ER cardiology consult and give  adenosine to establish the diagnosis. ?If it is Aflutter will need anticoagulation/rate control initially. ? ? ?VHD ?She will need TEE, will plan for after above is settled out ? ? ? ? ?Disposition: F/u with [place] in a couple weeks, to f/u and plan next steps ? ?Current medicines are reviewed at length with the patient today.  The patient did not have any concerns regarding medicines. ? ?Signed, ?Tommye Standard, PA-C ?02/05/2022 10:44 AM    ? ?[facility] ?[address] ?[address] ?[place] [address] ?[phone] (office)  ?[phone] (fax) ? ? ?

## 2022-02-07 ENCOUNTER — Encounter: Payer: Self-pay | Admitting: Physician Assistant

## 2022-02-07 ENCOUNTER — Emergency Department (HOSPITAL_COMMUNITY): Payer: Medicaid Other

## 2022-02-07 ENCOUNTER — Ambulatory Visit (INDEPENDENT_AMBULATORY_CARE_PROVIDER_SITE_OTHER): Payer: Medicaid Other | Admitting: Physician Assistant

## 2022-02-07 ENCOUNTER — Encounter (HOSPITAL_COMMUNITY): Payer: Self-pay | Admitting: Nurse Practitioner

## 2022-02-07 ENCOUNTER — Inpatient Hospital Stay (HOSPITAL_COMMUNITY)
Admission: EM | Admit: 2022-02-07 | Discharge: 2022-02-21 | DRG: 286 | Disposition: A | Discharge status: Rehabilitation Facility | Payer: Medicaid Other | Attending: Family Medicine | Admitting: Family Medicine

## 2022-02-07 ENCOUNTER — Other Ambulatory Visit: Payer: Self-pay

## 2022-02-07 VITALS — BP 110/80 | HR 145 | Ht 67.0 in | Wt 150.2 lb

## 2022-02-07 DIAGNOSIS — R739 Hyperglycemia, unspecified: Secondary | ICD-10-CM | POA: Diagnosis not present

## 2022-02-07 DIAGNOSIS — F1210 Cannabis abuse, uncomplicated: Secondary | ICD-10-CM | POA: Diagnosis present

## 2022-02-07 DIAGNOSIS — Z7151 Drug abuse counseling and surveillance of drug abuser: Secondary | ICD-10-CM

## 2022-02-07 DIAGNOSIS — I468 Cardiac arrest due to other underlying condition: Secondary | ICD-10-CM | POA: Diagnosis not present

## 2022-02-07 DIAGNOSIS — I110 Hypertensive heart disease with heart failure: Secondary | ICD-10-CM | POA: Diagnosis not present

## 2022-02-07 DIAGNOSIS — E876 Hypokalemia: Secondary | ICD-10-CM | POA: Diagnosis not present

## 2022-02-07 DIAGNOSIS — I10 Essential (primary) hypertension: Secondary | ICD-10-CM | POA: Diagnosis not present

## 2022-02-07 DIAGNOSIS — I471 Supraventricular tachycardia: Secondary | ICD-10-CM

## 2022-02-07 DIAGNOSIS — I130 Hypertensive heart and chronic kidney disease with heart failure and stage 1 through stage 4 chronic kidney disease, or unspecified chronic kidney disease: Secondary | ICD-10-CM | POA: Diagnosis present

## 2022-02-07 DIAGNOSIS — J154 Pneumonia due to other streptococci: Secondary | ICD-10-CM | POA: Diagnosis not present

## 2022-02-07 DIAGNOSIS — I5033 Acute on chronic diastolic (congestive) heart failure: Secondary | ICD-10-CM | POA: Diagnosis present

## 2022-02-07 DIAGNOSIS — I82611 Acute embolism and thrombosis of superficial veins of right upper extremity: Secondary | ICD-10-CM | POA: Diagnosis not present

## 2022-02-07 DIAGNOSIS — I4892 Unspecified atrial flutter: Principal | ICD-10-CM | POA: Diagnosis present

## 2022-02-07 DIAGNOSIS — G4700 Insomnia, unspecified: Secondary | ICD-10-CM | POA: Diagnosis not present

## 2022-02-07 DIAGNOSIS — R7401 Elevation of levels of liver transaminase levels: Secondary | ICD-10-CM | POA: Diagnosis present

## 2022-02-07 DIAGNOSIS — A419 Sepsis, unspecified organism: Secondary | ICD-10-CM | POA: Diagnosis not present

## 2022-02-07 DIAGNOSIS — J189 Pneumonia, unspecified organism: Secondary | ICD-10-CM | POA: Diagnosis not present

## 2022-02-07 DIAGNOSIS — F419 Anxiety disorder, unspecified: Secondary | ICD-10-CM | POA: Diagnosis present

## 2022-02-07 DIAGNOSIS — N1831 Chronic kidney disease, stage 3a: Secondary | ICD-10-CM | POA: Diagnosis present

## 2022-02-07 DIAGNOSIS — R0602 Shortness of breath: Secondary | ICD-10-CM | POA: Diagnosis not present

## 2022-02-07 DIAGNOSIS — I3139 Other pericardial effusion (noninflammatory): Secondary | ICD-10-CM | POA: Diagnosis present

## 2022-02-07 DIAGNOSIS — R451 Restlessness and agitation: Secondary | ICD-10-CM | POA: Diagnosis not present

## 2022-02-07 DIAGNOSIS — J9601 Acute respiratory failure with hypoxia: Secondary | ICD-10-CM | POA: Diagnosis not present

## 2022-02-07 DIAGNOSIS — I340 Nonrheumatic mitral (valve) insufficiency: Secondary | ICD-10-CM

## 2022-02-07 DIAGNOSIS — R6521 Severe sepsis with septic shock: Secondary | ICD-10-CM | POA: Diagnosis not present

## 2022-02-07 DIAGNOSIS — R570 Cardiogenic shock: Secondary | ICD-10-CM | POA: Diagnosis not present

## 2022-02-07 DIAGNOSIS — Z6821 Body mass index (BMI) 21.0-21.9, adult: Secondary | ICD-10-CM

## 2022-02-07 DIAGNOSIS — I5032 Chronic diastolic (congestive) heart failure: Secondary | ICD-10-CM | POA: Diagnosis not present

## 2022-02-07 DIAGNOSIS — I483 Typical atrial flutter: Secondary | ICD-10-CM | POA: Diagnosis not present

## 2022-02-07 DIAGNOSIS — R609 Edema, unspecified: Secondary | ICD-10-CM | POA: Diagnosis not present

## 2022-02-07 DIAGNOSIS — F05 Delirium due to known physiological condition: Secondary | ICD-10-CM | POA: Diagnosis not present

## 2022-02-07 DIAGNOSIS — E440 Moderate protein-calorie malnutrition: Secondary | ICD-10-CM | POA: Diagnosis present

## 2022-02-07 DIAGNOSIS — I051 Rheumatic mitral insufficiency: Secondary | ICD-10-CM | POA: Diagnosis not present

## 2022-02-07 DIAGNOSIS — Z7982 Long term (current) use of aspirin: Secondary | ICD-10-CM

## 2022-02-07 DIAGNOSIS — G479 Sleep disorder, unspecified: Secondary | ICD-10-CM | POA: Diagnosis not present

## 2022-02-07 DIAGNOSIS — I2510 Atherosclerotic heart disease of native coronary artery without angina pectoris: Secondary | ICD-10-CM | POA: Diagnosis present

## 2022-02-07 DIAGNOSIS — I480 Paroxysmal atrial fibrillation: Secondary | ICD-10-CM | POA: Diagnosis present

## 2022-02-07 DIAGNOSIS — J810 Acute pulmonary edema: Secondary | ICD-10-CM | POA: Diagnosis not present

## 2022-02-07 DIAGNOSIS — I248 Other forms of acute ischemic heart disease: Secondary | ICD-10-CM | POA: Diagnosis present

## 2022-02-07 DIAGNOSIS — I361 Nonrheumatic tricuspid (valve) insufficiency: Secondary | ICD-10-CM | POA: Diagnosis not present

## 2022-02-07 DIAGNOSIS — Y95 Nosocomial condition: Secondary | ICD-10-CM | POA: Diagnosis present

## 2022-02-07 DIAGNOSIS — F17210 Nicotine dependence, cigarettes, uncomplicated: Secondary | ICD-10-CM | POA: Diagnosis present

## 2022-02-07 DIAGNOSIS — G931 Anoxic brain damage, not elsewhere classified: Secondary | ICD-10-CM | POA: Diagnosis not present

## 2022-02-07 DIAGNOSIS — R5381 Other malaise: Secondary | ICD-10-CM | POA: Diagnosis not present

## 2022-02-07 DIAGNOSIS — I083 Combined rheumatic disorders of mitral, aortic and tricuspid valves: Secondary | ICD-10-CM | POA: Diagnosis present

## 2022-02-07 DIAGNOSIS — I509 Heart failure, unspecified: Secondary | ICD-10-CM | POA: Diagnosis not present

## 2022-02-07 DIAGNOSIS — N170 Acute kidney failure with tubular necrosis: Secondary | ICD-10-CM | POA: Diagnosis present

## 2022-02-07 DIAGNOSIS — Z79899 Other long term (current) drug therapy: Secondary | ICD-10-CM

## 2022-02-07 DIAGNOSIS — Z8674 Personal history of sudden cardiac arrest: Secondary | ICD-10-CM | POA: Diagnosis not present

## 2022-02-07 DIAGNOSIS — Z01810 Encounter for preprocedural cardiovascular examination: Secondary | ICD-10-CM | POA: Diagnosis not present

## 2022-02-07 DIAGNOSIS — I469 Cardiac arrest, cause unspecified: Secondary | ICD-10-CM | POA: Diagnosis not present

## 2022-02-07 DIAGNOSIS — I4891 Unspecified atrial fibrillation: Secondary | ICD-10-CM | POA: Diagnosis not present

## 2022-02-07 DIAGNOSIS — Z8679 Personal history of other diseases of the circulatory system: Secondary | ICD-10-CM

## 2022-02-07 HISTORY — DX: Supraventricular tachycardia: I47.1

## 2022-02-07 HISTORY — DX: Supraventricular tachycardia, unspecified: I47.10

## 2022-02-07 HISTORY — DX: Nonrheumatic aortic (valve) insufficiency: I35.1

## 2022-02-07 HISTORY — DX: Rheumatic tricuspid insufficiency: I07.1

## 2022-02-07 HISTORY — DX: Nonrheumatic mitral (valve) insufficiency: I34.0

## 2022-02-07 HISTORY — DX: Unspecified atrial flutter: I48.92

## 2022-02-07 LAB — MAGNESIUM: Magnesium: 1.9 mg/dL (ref 1.7–2.4)

## 2022-02-07 LAB — CBC WITH DIFFERENTIAL/PLATELET
Abs Immature Granulocytes: 0.07 10*3/uL (ref 0.00–0.07)
Basophils Absolute: 0.1 10*3/uL (ref 0.0–0.1)
Basophils Relative: 1 %
Eosinophils Absolute: 0 10*3/uL (ref 0.0–0.5)
Eosinophils Relative: 0 %
HCT: 42.2 % (ref 36.0–46.0)
Hemoglobin: 13.7 g/dL (ref 12.0–15.0)
Immature Granulocytes: 1 %
Lymphocytes Relative: 23 %
Lymphs Abs: 2.3 10*3/uL (ref 0.7–4.0)
MCH: 29 pg (ref 26.0–34.0)
MCHC: 32.5 g/dL (ref 30.0–36.0)
MCV: 89.4 fL (ref 80.0–100.0)
Monocytes Absolute: 0.8 10*3/uL (ref 0.1–1.0)
Monocytes Relative: 8 %
Neutro Abs: 6.9 10*3/uL (ref 1.7–7.7)
Neutrophils Relative %: 67 %
Platelets: 285 10*3/uL (ref 150–400)
RBC: 4.72 MIL/uL (ref 3.87–5.11)
RDW: 17.7 % — ABNORMAL HIGH (ref 11.5–15.5)
WBC: 10.1 10*3/uL (ref 4.0–10.5)
nRBC: 0.4 % — ABNORMAL HIGH (ref 0.0–0.2)

## 2022-02-07 LAB — HEMOGLOBIN A1C
Hgb A1c MFr Bld: 5.3 % (ref 4.8–5.6)
Mean Plasma Glucose: 105.41 mg/dL

## 2022-02-07 LAB — URINALYSIS, ROUTINE W REFLEX MICROSCOPIC
Bilirubin Urine: NEGATIVE
Glucose, UA: NEGATIVE mg/dL
Hgb urine dipstick: NEGATIVE
Ketones, ur: NEGATIVE mg/dL
Nitrite: NEGATIVE
Protein, ur: 100 mg/dL — AB
Specific Gravity, Urine: 1.021 (ref 1.005–1.030)
pH: 5 (ref 5.0–8.0)

## 2022-02-07 LAB — TSH: TSH: 1.569 u[IU]/mL (ref 0.350–4.500)

## 2022-02-07 LAB — COMPREHENSIVE METABOLIC PANEL
ALT: 169 U/L — ABNORMAL HIGH (ref 0–44)
AST: 122 U/L — ABNORMAL HIGH (ref 15–41)
Albumin: 3.2 g/dL — ABNORMAL LOW (ref 3.5–5.0)
Alkaline Phosphatase: 125 U/L (ref 38–126)
Anion gap: 10 (ref 5–15)
BUN: 27 mg/dL — ABNORMAL HIGH (ref 6–20)
CO2: 19 mmol/L — ABNORMAL LOW (ref 22–32)
Calcium: 9.1 mg/dL (ref 8.9–10.3)
Chloride: 108 mmol/L (ref 98–111)
Creatinine, Ser: 1.35 mg/dL — ABNORMAL HIGH (ref 0.44–1.00)
GFR, Estimated: 46 mL/min — ABNORMAL LOW (ref >60)
Glucose, Bld: 120 mg/dL — ABNORMAL HIGH (ref 70–99)
Potassium: 4.5 mmol/L (ref 3.5–5.1)
Sodium: 137 mmol/L (ref 135–145)
Total Bilirubin: 1.9 mg/dL — ABNORMAL HIGH (ref 0.3–1.2)
Total Protein: 6.3 g/dL — ABNORMAL LOW (ref 6.5–8.1)

## 2022-02-07 LAB — RAPID URINE DRUG SCREEN, HOSP PERFORMED
Amphetamines: NOT DETECTED
Barbiturates: NOT DETECTED
Benzodiazepines: NOT DETECTED
Cocaine: POSITIVE — AB
Opiates: NOT DETECTED
Tetrahydrocannabinol: POSITIVE — AB

## 2022-02-07 LAB — TROPONIN I (HIGH SENSITIVITY)
Troponin I (High Sensitivity): 29 ng/L — ABNORMAL HIGH (ref <18)
Troponin I (High Sensitivity): 27 ng/L — ABNORMAL HIGH (ref <18)

## 2022-02-07 LAB — I-STAT CHEM 8, ED
BUN: 35 mg/dL — ABNORMAL HIGH (ref 6–20)
Calcium, Ion: 1.08 mmol/L — ABNORMAL LOW (ref 1.15–1.40)
Chloride: 109 mmol/L (ref 98–111)
Creatinine, Ser: 1.2 mg/dL — ABNORMAL HIGH (ref 0.44–1.00)
Glucose, Bld: 115 mg/dL — ABNORMAL HIGH (ref 70–99)
HCT: 43 % (ref 36.0–46.0)
Hemoglobin: 14.6 g/dL (ref 12.0–15.0)
Potassium: 4.6 mmol/L (ref 3.5–5.1)
Sodium: 138 mmol/L (ref 135–145)
TCO2: 21 mmol/L — ABNORMAL LOW (ref 22–32)

## 2022-02-07 LAB — T4, FREE: Free T4: 1.43 ng/dL — ABNORMAL HIGH (ref 0.61–1.12)

## 2022-02-07 LAB — PROTIME-INR
INR: 1.3 — ABNORMAL HIGH (ref 0.8–1.2)
Prothrombin Time: 15.7 seconds — ABNORMAL HIGH (ref 11.4–15.2)

## 2022-02-07 MED ORDER — METOPROLOL TARTRATE 5 MG/5ML IV SOLN
2.5000 mg | Freq: Once | INTRAVENOUS | Status: AC
  Administered 2022-02-07: 2.5 mg via INTRAVENOUS
  Filled 2022-02-07: qty 5

## 2022-02-07 MED ORDER — ONDANSETRON HCL 4 MG/2ML IJ SOLN
4.0000 mg | Freq: Four times a day (QID) | INTRAMUSCULAR | Status: DC | PRN
  Administered 2022-02-07 – 2022-02-15 (×2): 4 mg via INTRAVENOUS
  Filled 2022-02-07 (×2): qty 2

## 2022-02-07 MED ORDER — ADENOSINE 6 MG/2ML IV SOLN
12.0000 mg | Freq: Once | INTRAVENOUS | Status: AC
Start: 2022-02-07 — End: 2022-02-07
  Administered 2022-02-07: 12 mg via INTRAVENOUS

## 2022-02-07 MED ORDER — SODIUM CHLORIDE 0.9 % IV SOLN: INTRAVENOUS | Status: DC

## 2022-02-07 MED ORDER — ADENOSINE 6 MG/2ML IV SOLN
INTRAVENOUS | Status: AC
  Administered 2022-02-07: 12 mg
  Filled 2022-02-07: qty 4

## 2022-02-07 MED ORDER — METOPROLOL TARTRATE 5 MG/5ML IV SOLN: 2.5000 mg | INTRAVENOUS | Status: AC

## 2022-02-07 MED ORDER — METOPROLOL TARTRATE 25 MG PO TABS
50.0000 mg | ORAL_TABLET | Freq: Four times a day (QID) | ORAL | Status: DC
  Administered 2022-02-07: 50 mg via ORAL
  Filled 2022-02-07 (×2): qty 2

## 2022-02-07 MED ORDER — ACETAMINOPHEN 325 MG PO TABS
650.0000 mg | ORAL_TABLET | ORAL | Status: DC | PRN
  Administered 2022-02-12 – 2022-02-17 (×3): 650 mg via ORAL
  Filled 2022-02-07 (×3): qty 2

## 2022-02-07 MED ORDER — HEPARIN (PORCINE) 25000 UT/250ML-% IV SOLN
1100.0000 [IU]/h | INTRAVENOUS | Status: DC
Start: 2022-02-07 — End: 2022-02-08
  Administered 2022-02-07: 1000 [IU]/h via INTRAVENOUS
  Filled 2022-02-07: qty 250

## 2022-02-07 MED ORDER — ADENOSINE 6 MG/2ML IV SOLN
6.0000 mg | Freq: Once | INTRAVENOUS | Status: AC
  Administered 2022-02-07: 6 mg via INTRAVENOUS
  Filled 2022-02-07: qty 2

## 2022-02-07 MED ORDER — HEPARIN BOLUS VIA INFUSION
4000.0000 [IU] | Freq: Once | INTRAVENOUS | Status: AC
Start: 2022-02-07 — End: 2022-02-07
  Administered 2022-02-07: 4000 [IU] via INTRAVENOUS
  Filled 2022-02-07: qty 4000

## 2022-02-07 NOTE — ED Triage Notes (Signed)
Pt sent here from cardiology office for increased HR in 140s/150s. Pt denies pain, states she can feel her heart racing but denies shob or lightheadedness. Pt reports she had an episode of feeling lightheaded and palpitations around 5/7 after she "had some drinks" for her birthday.  ?

## 2022-02-07 NOTE — ED Notes (Signed)
Cards at bedside, EDP at bedside, RT at bedside, pt hooked to defib pads. ?

## 2022-02-07 NOTE — ED Notes (Signed)
This RN called MD to clarify orders and updated MD on pt's current status such as HR and rhythm. No orders received at this time. New EKG captured and crossed over into chart.  ?

## 2022-02-07 NOTE — ED Provider Triage Note (Signed)
Emergency Medicine Provider Triage Evaluation Note ? ?Lisa Ayala , a 56 y.o. female  was evaluated in triage.  Pt complains of transient tachycardia.  Hx of SVT.  No current complaints at this time.  Denies shortness of breath, fever, headache, chest pain. ? ?Review of Systems  ?Positive: As above ?Negative: As above ? ?Physical Exam  ?BP (!) 154/124 (BP Location: Right Arm)   Pulse 71   Temp 98.7 ?F (37.1 ?C) (Oral)   Resp 18   SpO2 99%  ?Gen:   Awake, no distress, sitting comfortably ?Resp:  Normal effort, CTAB ?MSK:   Moves extremities without difficulty  ?Other:  Regular rate and rhythm ? ?Medical Decision Making  ?Medically screening exam initiated at 5:33 PM.  Appropriate orders placed.  Lisa Ayala was informed that the remainder of the evaluation will be completed by another provider, this initial triage assessment does not replace that evaluation, and the importance of remaining in the ED until their evaluation is complete. ? ?EKG ordered.  Unable to finish triage due to being brought timely and efficiently to the next available room. ?  ?Cecil Cobbs, PA-C ?02/07/22 1739 ? ?

## 2022-02-07 NOTE — H&P (Addendum)
? ?History & Physical  ?  ?Patient ID: Lisa Ayala ?MRN: [id number], DOB/AGE: 10/20/1965  ? ?Admit date: 02/07/2022 ? ? ?Primary Physician: Pcp, No ?Primary Cardiologist: Will Meredith Leeds, MD ? ?Patient Profile  ?  ?56 year old female with a history of SVT, severe mitral regurgitation, moderate TR, mild to moderate AI, and anxiety, who presented to the ED today secondary to a 11-day history of persistent tachycardia. ? ?Past Medical History  ? ? ?Past Medical History:  ?Diagnosis Date  ? Aortic insufficiency   ? PSVT (paroxysmal supraventricular tachycardia) ([facility])   ? Severe mitral regurgitation   ? a. 08/2021 Echo: EF 70-75%, no rwma, mod dil LV, GrII DD, nl RV fxn, massively dil LA, mild-mod dil RA, severe MR due to lack of central coaptation of MV leaflets. Mild MS. Mod TR. Mild-mod AI.  ? Tricuspid regurgitation   ?  ?History reviewed. No pertinent surgical history.  ? ?Allergies ? ?No Known Allergies ? ?History of Present Illness  ?  ?56 year old female with the above past medical history including anxiety, SVT, and valvular heart disease including severe mitral regurgitation.  In October 2022, she was seen in the emergency department with abdominal and back pain.  In triage, she was noted to be in a wide-complex tachycardia at 215 bpm.  She spontaneously converted while in the emergency department.  She subsequently followed up with Dr. Curt Bears in electrophysiology clinic in November 2022.  Wide-complex tachycardia was felt to represent an SVT with aberrant conduction.  Patient preferred to avoid catheter ablation and therefore, she was started on Toprol-XL 50 mg daily.  She was also noted to have a systolic murmur on examination and subsequently underwent echocardiography which showed an EF of 70 to 75% with moderately dilated LV, grade 2 diastolic dysfunction, massively dilated left atrium, severe mitral regurgitation, mild mitral stenosis, moderate TR, and mild to moderate aortic insufficiency.  She was  subsequently scheduled for transesophageal echocardiogram however, this was canceled because she did not have a ride.  She was also referred to structural heart clinic and no showed for both that appointment and for follow-up with EP. ? ?On May 5, patient was celebrating her birthday and said that she had 2 alcoholic beverages.  Shortly thereafter, she started noticing a rise in her heart rate, where she could see her heart "beating out of [her] chest."  She noted intermittent dyspnea when this occurred.  She was not routinely checking her heart rate and so is her impression that she only had intermittent tachycardia.  She has had intermittent dyspnea on exertion but otherwise says that she has been feeling pretty normally.  She had follow-up in electrophysiology clinic today and was found to be in SVT versus atrial flutter at 145 bpm.  She was referred to the emergency department.  Here, she has received multiple rounds of adenosine with only brief slowing and evidence of flutter waves.  She is currently asymptomatic.  She denies any history of chest pain, PND, orthopnea, dizziness, syncope, edema, or early satiety. ? ?Home Medications  ?  ?Prior to Admission medications   ?Medication Sig Start Date End Date Taking? Authorizing Provider  ?aspirin EC 81 MG tablet Take 81 mg by mouth daily. Swallow whole.    [provider]  ?metoprolol succinate (TOPROL-XL) 50 MG 24 hr tablet TAKE 1 TABLET BY MOUTH DAILY. TAKE WITH OR IMMEDIATELY FOLLOWING A MEAL. 11/21/21   Camnitz, Ocie Doyne, MD  ? ? ?Family History  ?  ?Family History  ?  Family history unknown: Yes  ? ?She indicated that her father is deceased. ? ? ?Social History  ?  ?Social History  ? ?Socioeconomic History  ? Marital status: Divorced  ?  Spouse name: Not on file  ? Number of children: Not on file  ? Years of education: Not on file  ? Highest education level: Not on file  ?Occupational History  ? Not on file  ?Tobacco Use  ? Smoking status: Never  ?  Smokeless tobacco: Never  ?Substance and Sexual Activity  ? Alcohol use: Not Currently  ? Drug use: Yes  ?  Types: Marijuana  ? Sexual activity: Not on file  ?Other Topics Concern  ? Not on file  ?Social History Narrative  ? Lives locally.  Relatively active.  ? ?Social Determinants of Health  ? ?Financial Resource Strain: Not on file  ?Food Insecurity: Not on file  ?Transportation Needs: Not on file  ?Physical Activity: Not on file  ?Stress: Not on file  ?Social Connections: Not on file  ?Intimate Partner Violence: Not on file  ?  ? ?Review of Systems  ?  ?General:  No chills, fever, night sweats or weight changes.  ?Cardiovascular:  No chest pain, +++ occasional dyspnea on exertion and palpitations, no edema, orthopnea, paroxysmal nocturnal dyspnea. ?Dermatological: No rash, lesions/masses ?Respiratory: No cough, dyspnea ?Urologic: No hematuria, dysuria ?Abdominal:   No nausea, vomiting, diarrhea, bright red blood per rectum, melena, or hematemesis ?Neurologic:  No visual changes, wkns, changes in mental status. ?All other systems reviewed and are otherwise negative except as noted above. ? ?Physical Exam  ?  ?Blood pressure (!) 134/114, pulse (!) 141, temperature 98.7 ?F (37.1 ?C), temperature source Oral, resp. rate (!) 30, SpO2 100 %.  ?General: Pleasant, NAD ?Psych: Normal affect. ?Neuro: Alert and oriented X 3. Moves all extremities spontaneously. ?HEENT: Normal  ?Neck: Supple without bruits or JVD. ?Lungs:  Resp regular and unlabored, CTA. ?Heart: RRR, tachycardic, 2/6 systolic murmur throughout, no s3, s4, or rubs. ?Abdomen: Soft, non-tender, non-distended, BS + x 4.  ?Extremities: No clubbing, cyanosis or edema. DP/PT 2+, Radials 2+ and equal bilaterally. ? ?Labs  ?  ?Cardiac Enzymes ?Recent Labs  ?Lab 02/07/22 ?[id number]  ?TROPONINIHS 29*  ?   ? ?Lab Results  ?Component Value Date  ? WBC 10.1 02/07/2022  ? HGB 14.6 02/07/2022  ? HCT 43.0 02/07/2022  ? MCV 89.4 02/07/2022  ? PLT 285 02/07/2022  ?  ?Recent Labs   ?Lab 02/07/22 ?1749 02/07/22 ?1812  ?NA 137 138  ?K 4.5 4.6  ?CL 108 109  ?CO2 19*  --   ?BUN 27* 35*  ?CREATININE 1.35* 1.20*  ?CALCIUM 9.1  --   ?PROT 6.3*  --   ?BILITOT 1.9*  --   ?ALKPHOS 125  --   ?ALT 169*  --   ?AST 122*  --   ?GLUCOSE 120* 115*  ? ?BNP ?   ?Component Value Date/Time  ? BNP 1,157.2 (H) 07/02/2021 1335  ? ?  ?Radiology Studies  ?  ?No results found. ? ?ECG & Cardiac Imaging  ?  ?Atrial flutter, 153, rightward axis, IVCD- personally reviewed. ? ?Assessment & Plan  ?  ?1.  Persistent atrial flutter with rapid ventricular response: Patient previously diagnosed with wide-complex tachycardia in October 2022 felt to represent SVT with aberrant conduction.  She has been managed with a beta-blocker in the outpatient setting.  She is known to have severe mitral regurgitation based on echocardiogram in December 2022.  Since May 5, she has been noticing intermittent elevations in heart rate with associated dyspnea, as well as intermittent dyspnea on exertion sometimes unrelated to elevations in heart rate.  She does not have a way of documenting her heart rate at home.  She was seen in electrophysiology clinic today and was noted to be in SVT versus atrial flutter at 145 bpm, and was referred to the ED for additional evaluation and treatment with adenosine.  She has received multiple rounds of adenosine here and had only a brief pause in her rhythm with evidence of flutter waves.  Her CHA2DS2VASc is 1.  She is currently asymptomatic.  We will add intravenous heparin and escalate oral beta-blocker therapy with plan for TEE and cardioversion in the a.m.  Risks and benefits of TEE and cardioversion explained to the patient in detail and she is willing to proceed.  With severe rheumatic valvular disease, may need to consider utilization of warfarin over direct oral anticoagulant however history of noncompliance with follow-up likely makes her a poor warfarin candidate. ? ?2.  Severe mitral regurgitation:  Previously noted on echo in October 2022 with recommendation for TEE at this time.  That was never carried out to due to lack of a ride and then subsequent lost to follow-up.  In the setting of above, w

## 2022-02-07 NOTE — ED Provider Notes (Signed)
?[facility] EMERGENCY DEPARTMENT ?Provider Note ? ? ?CSN: [id number] ?Arrival date & time: 02/07/22  1624 ? ?  ? ?History ? ?Chief Complaint  ?Patient presents with  ? Tachycardia  ? ? ?Lisa Ayala is a 56 y.o. female. ? ?HPI ?Patient has history of SVT diagnosed in the emergency department subsequently seen by Dr. Elberta Fortis in November 2022.  At that time, patient did not want further invasive management and has been on Toprol.  She has been doing well until her birthday which was on [birth year]. She had a few drinks which is unsual for her and a day or 2 later started feeling her heart fast intermittently making her SOB. She has ahad this on/off since then.  Patient denies chest pain or shortness of breath.  No syncopal episodes. ?Her daughter noticed that when sleeping she would seem to be resting quitely for about 50 seconds then rapid breathing for 20 seconds, and this would last for a few minutes. ? ?Patient reports she does smoke marijuana intermittently but no use of cocaine or injectable drugs.  Occasional alcohol use. ?  ? ?Home Medications ?Prior to Admission medications   ?Medication Sig Start Date End Date Taking? Authorizing Provider  ?aspirin EC 81 MG tablet Take 81 mg by mouth daily. Swallow whole.    [provider]  ?metoprolol succinate (TOPROL-XL) 50 MG 24 hr tablet TAKE 1 TABLET BY MOUTH DAILY. TAKE WITH OR IMMEDIATELY FOLLOWING A MEAL. 11/21/21   Camnitz, Andree Coss, MD  ?   ? ?Allergies    ?Patient has no known allergies.   ? ?Review of Systems   ?Review of Systems ?10 Systems reviewed and negative except as per HPI ?Physical Exam ?Updated Vital Signs ?BP (!) 154/124   Pulse (!) 146   Temp 98.7 ?F (37.1 ?C) (Oral)   Resp 18   SpO2 98%  ?Physical Exam ?Constitutional:   ?   Appearance: Normal appearance.  ?HENT:  ?   Mouth/Throat:  ?   Mouth: Mucous membranes are moist.  ?   Pharynx: Oropharynx is clear.  ?Eyes:  ?   Extraocular Movements: Extraocular movements intact.   ?Cardiovascular:  ?   Comments: Tachycardia 2 out of 6 systolic ejection murmur ?Pulmonary:  ?   Effort: Pulmonary effort is normal.  ?   Breath sounds: Normal breath sounds.  ?Abdominal:  ?   General: There is no distension.  ?   Palpations: Abdomen is soft.  ?   Tenderness: There is no abdominal tenderness. There is no guarding.  ?Musculoskeletal:     ?   General: No swelling. Normal range of motion.  ?   Right lower leg: No edema.  ?   Left lower leg: No edema.  ?Skin: ?   General: Skin is warm and dry.  ?Neurological:  ?   General: No focal deficit present.  ?   Mental Status: She is alert and oriented to person, place, and time.  ?   Cranial Nerves: No cranial nerve deficit.  ?   Motor: No weakness.  ?   Coordination: Coordination normal.  ?Psychiatric:  ?   Comments: Patient is mildly anxious but cooperative without difficulty.   ? ? ?ED Results / Procedures / Treatments   ?Labs ?(all labs ordered are listed, but only abnormal results are displayed) ?Labs Reviewed  ?COMPREHENSIVE METABOLIC PANEL - Abnormal; Notable for the following components:  ?    Result Value  ? CO2 19 (*)   ?  Glucose, Bld 120 (*)   ? BUN 27 (*)   ? Creatinine, Ser 1.35 (*)   ? Total Protein 6.3 (*)   ? Albumin 3.2 (*)   ? AST 122 (*)   ? ALT 169 (*)   ? Total Bilirubin 1.9 (*)   ? GFR, Estimated 46 (*)   ? All other components within normal limits  ?CBC WITH DIFFERENTIAL/PLATELET - Abnormal; Notable for the following components:  ? RDW 17.7 (*)   ? nRBC 0.4 (*)   ? All other components within normal limits  ?I-STAT CHEM 8, ED - Abnormal; Notable for the following components:  ? BUN 35 (*)   ? Creatinine, Ser 1.20 (*)   ? Glucose, Bld 115 (*)   ? Calcium, Ion 1.08 (*)   ? TCO2 21 (*)   ? All other components within normal limits  ?TROPONIN I (HIGH SENSITIVITY) - Abnormal; Notable for the following components:  ? Troponin I (High Sensitivity) 29 (*)   ? All other components within normal limits  ?URINALYSIS, ROUTINE W REFLEX MICROSCOPIC   ?RAPID URINE DRUG SCREEN, HOSP PERFORMED  ?TROPONIN I (HIGH SENSITIVITY)  ? ? ?EKG ?EKG Interpretation ? ?Date/Time:  Tuesday Feb 07 2022 17:24:55 EDT ?Ventricular Rate:  153 ?PR Interval:    ?QRS Duration: 144 ?QT Interval:  370 ?QTC Calculation: 590 ?R Axis:   94 ?Text Interpretation: Atrial flutter with variable A-V block Rightward axis Non-specific intra-ventricular conduction block Cannot rule out Septal infarct , age undetermined T wave abnormality, consider anterolateral ischemia Abnormal ECG When compared with ECG of 02-Jul-2021 11:34, PREVIOUS ECG IS PRESENT high heart? rate on previous tracing but suspect flutter waves in V4 and III Confirmed by Arby Barrette [phone]) on 02/07/2022 7:25:21 PM ? ?Radiology ?No results found. ? ?Procedures ?.Cardioversion ? ?Date/Time: 02/07/2022 7:26 PM ?Performed by: Arby Barrette, MD ?Authorized by: Arby Barrette, MD  ?  ? ?CRITICAL CARE ?Performed by: Arby Barrette ? ? ?Total critical care time: 45 minutes ? ?Critical care time was exclusive of separately billable procedures and treating other patients. ? ?Critical care was necessary to treat or prevent imminent or life-threatening deterioration. ? ?Critical care was time spent personally by me on the following activities: development of treatment plan with patient and/or surrogate as well as nursing, discussions with consultants, evaluation of patient's response to treatment, examination of patient, obtaining history from patient or surrogate, ordering and performing treatments and interventions, ordering and review of laboratory studies, ordering and review of radiographic studies, pulse oximetry and re-evaluation of patient's condition.  ? ?Adenosine cardioversion attemptedx3 ? ?All preparations made with respiratory at bedside.  Available equipment includes IV suction and bag-valve-mask. ? ?Patient was placed on defibrillator and monitored continuously. ? ?Dr. Bjorn Pippin cardiology at bedside. ? ?First attempt of  adenosine 6 mg no change. ?12 mg then administered, patient felt slightly symptomatic but no significant change in rhythm. ? ?I had nurses move stopcock up to first port on IV in [facility].  Rapid push adenosine 12 mg followed by 20 ml normal saline.  There was brief slowing adequate to observe suspected flutter waves.  Rate however did not come below 130s.  No typical block pattern and resumption. ? ?Patient tolerated well.  No adverse outcomes. ? ? ?Medications Ordered in ED ?Medications  ?adenosine (ADENOCARD) 6 MG/2ML injection 6 mg (6 mg Intravenous Given 02/07/22 1845)  ?metoprolol tartrate (LOPRESSOR) injection 2.5 mg (2.5 mg Intravenous Given 02/07/22 1807)  ?adenosine (ADENOCARD) 6 MG/2ML injection (12 mg  Given  02/07/22 1847)  ?adenosine (ADENOCARD) 6 MG/2ML injection 12 mg (12 mg Intravenous Given 02/07/22 1854)  ? ? ?ED Course/ Medical Decision Making/ A&P ?  ?                        ?Medical Decision Making ?Amount and/or Complexity of Data Reviewed ?Labs: ordered. ?Radiology: ordered. ? ?Risk ?Prescription drug management. ?Decision regarding hospitalization. ? ? ?Consult 18: 15: Reviewed with cardiology, Dr. Bjorn PippinSchumann, who will come down to assess patient. ? ?Patient sent to the emergency department from cardiology clinic.  She has had paroxysmal or continuous tachycardia likely for about 11 days.  Patient has tolerated this well.  She does not have dyspnea or chest pain.  Blood pressures are normotensive to slightly hypertensive.  ? ?Review labs shows mild AKI compared to baseline.  Troponin is at 29.  ? ?3 attempts at conversion with adenosine did not change patient's rhythm.  Patient has tolerated this well.  She is in no distress.  No respiratory distress and no chest pain.  Mental status is clear.  Dr. Gaynelle ArabianShuman advises he will admit to cardiology service for anticipated TEE cardioversion. ? ? ? ? ? ? ? ? ?Final Clinical Impression(s) / ED Diagnoses ?Final diagnoses:  ?Atrial flutter, unspecified type (HCC)   ? ? ?Rx / DC Orders ?ED Discharge Orders   ? ? None  ? ?  ? ? ?  ?Arby BarrettePfeiffer, Fumi Guadron, MD ?02/07/22 1936 ? ?

## 2022-02-07 NOTE — ED Notes (Signed)
Pt called out stating she was experiencing SOB. This RN assessed pt's O2 status (99% on RA), and put pt on 1L of O2 for comfort. Pt not in distress at this time and is sitting up in bed resting.  ?

## 2022-02-07 NOTE — Progress Notes (Signed)
ANTICOAGULATION CONSULT NOTE - Initial Consult ? ?Pharmacy Consult for Heparin ?Indication: atrial fibrillation ? ?No Known Allergies ? ?Patient Measurements: ?  ?Heparin Dosing Weight: 68.1 kg ? ?Vital Signs: ?Temp: 98.7 ?F (37.1 ?C) (05/16 1723) ?Temp Source: Oral (05/16 1723) ?BP: 134/114 (05/16 1930) ?Pulse Rate: 141 (05/16 1930) ? ?Labs: ?Recent Labs  ?  02/07/22 ?[id number] 02/07/22 ?1812  ?HGB 13.7 14.6  ?HCT 42.2 43.0  ?PLT 285  --   ?CREATININE 1.35* 1.20*  ?TROPONINIHS 29*  --   ? ? ?Estimated Creatinine Clearance: 50.3 mL/min (A) (by C-G formula based on SCr of 1.2 mg/dL (H)). ? ? ?Medical History: ?Past Medical History:  ?Diagnosis Date  ? Aortic insufficiency   ? PSVT (paroxysmal supraventricular tachycardia) ([place])   ? Severe mitral regurgitation   ? a. 08/2021 Echo: EF 70-75%, no rwma, mod dil LV, GrII DD, nl RV fxn, massively dil LA, mild-mod dil RA, severe MR due to lack of central coaptation of MV leaflets. Mild MS. Mod TR. Mild-mod AI.  ? Tricuspid regurgitation   ? ? ?Medications:  ?(Not in a hospital admission) ? ?Scheduled:  ? heparin  4,000 Units Intravenous Once  ? metoprolol tartrate  2.5 mg Intravenous Q5 min  ? metoprolol tartrate  50 mg Oral Q6H  ? ?Infusions:  ? sodium chloride    ? heparin    ? ?PRN: acetaminophen, ondansetron (ZOFRAN) IV ? ?Assessment: ?33 yof with a history of SVT, mitral regurg TR, AI, anxiety. Patient is presenting with tachycardia. Heparin per pharmacy consult placed for atrial fibrillation. ? ?Patient is not on anticoagulation prior to arrival. ? ?Hgb 14.6; plt 285 ? ?Goal of Therapy:  ?Heparin level 0.3-0.7 units/ml ?Monitor platelets by anticoagulation protocol: Yes ?  ?Plan:  ?Give 4000 units bolus x 1 ?Start heparin infusion at 1000 units/hr ?Check anti-Xa level in 8 hours and daily while on heparin ?Continue to monitor H&H and platelets ? ?Lorelei Pont, PharmD, BCPS ?02/07/2022 7:56 PM ?ED Clinical Pharmacist -  [phone] ? ?  ?

## 2022-02-07 NOTE — Patient Instructions (Addendum)
?  YOU HAVE BEEN RECOMMENDED TO GOT TO EMERGENCY DEPARTMENT AND WILL BE CONTACTED BACK FOR 2 WEEK FOLLOW UP  ? ? ?Medication Instructions:  ? ?Your physician recommends that you continue on your current medications as directed. Please refer to the Current Medication list given to you today. ? ? ?*If you need a refill on your cardiac medications before your next appointment, please call your pharmacy* ? ? ?Lab Work:  NONE ORDERED  TODAY ? ? ? ?If you have labs (blood work) drawn today and your tests are completely normal, you will receive your results only by: ?MyChart Message (if you have MyChart) OR ?A paper copy in the mail ?If you have any lab test that is abnormal or we need to change your treatment, we will call you to review the results. ? ? ?Testing/Procedures: NONE ORDERED  TODAY ? ? ? ?Follow-Up: ?At [facility], you and your health needs are our priority.  As part of our continuing mission to provide you with exceptional heart care, we have created designated Provider Care Teams.  These Care Teams include your primary Cardiologist (physician) and Advanced Practice Providers (APPs -  Physician Assistants and Nurse Practitioners) who all work together to provide you with the care you need, when you need it. ? ?We recommend signing up for the patient portal called "MyChart".  Sign up information is provided on this After Visit Summary.  MyChart is used to connect with patients for Virtual Visits (Telemedicine).  Patients are able to view lab/test results, encounter notes, upcoming appointments, etc.  Non-urgent messages can be sent to your provider as well.   ?To learn more about what you can do with MyChart, go to [email or web address].   ? ?Your next appointment:   ?2 week(s) ? ?The format for your next appointment:   ?In Person ? ?Provider:   ?Loman Brooklyn, MD  ? ? ?Other Instructions ? ? ?Important Information About Sugar ? ? ? ? ?  ?

## 2022-02-07 NOTE — ED Notes (Signed)
Pt vomited brown-red emesis. Zofran given per orders  ?

## 2022-02-08 ENCOUNTER — Encounter (HOSPITAL_COMMUNITY)
Admission: EM | Disposition: A | Discharge status: Rehabilitation Facility | Payer: Self-pay | Source: Home / Self Care | Attending: Internal Medicine

## 2022-02-08 ENCOUNTER — Inpatient Hospital Stay (HOSPITAL_COMMUNITY): Payer: Medicaid Other | Admitting: Anesthesiology

## 2022-02-08 ENCOUNTER — Encounter (HOSPITAL_COMMUNITY): Payer: Self-pay | Admitting: Cardiology

## 2022-02-08 ENCOUNTER — Inpatient Hospital Stay (HOSPITAL_COMMUNITY): Payer: Medicaid Other

## 2022-02-08 ENCOUNTER — Inpatient Hospital Stay (HOSPITAL_COMMUNITY): Admit: 2022-02-08 | Payer: Medicaid Other

## 2022-02-08 ENCOUNTER — Other Ambulatory Visit (HOSPITAL_COMMUNITY): Payer: Self-pay

## 2022-02-08 DIAGNOSIS — J9601 Acute respiratory failure with hypoxia: Secondary | ICD-10-CM

## 2022-02-08 DIAGNOSIS — I509 Heart failure, unspecified: Secondary | ICD-10-CM

## 2022-02-08 DIAGNOSIS — R7401 Elevation of levels of liver transaminase levels: Secondary | ICD-10-CM | POA: Insufficient documentation

## 2022-02-08 DIAGNOSIS — I4892 Unspecified atrial flutter: Secondary | ICD-10-CM

## 2022-02-08 DIAGNOSIS — I340 Nonrheumatic mitral (valve) insufficiency: Secondary | ICD-10-CM

## 2022-02-08 DIAGNOSIS — I469 Cardiac arrest, cause unspecified: Secondary | ICD-10-CM

## 2022-02-08 DIAGNOSIS — J810 Acute pulmonary edema: Secondary | ICD-10-CM

## 2022-02-08 DIAGNOSIS — I5033 Acute on chronic diastolic (congestive) heart failure: Secondary | ICD-10-CM

## 2022-02-08 DIAGNOSIS — R0602 Shortness of breath: Secondary | ICD-10-CM

## 2022-02-08 HISTORY — PX: TEE WITHOUT CARDIOVERSION: SHX5443

## 2022-02-08 LAB — POCT I-STAT 7, (LYTES, BLD GAS, ICA,H+H)
Acid-Base Excess: 6 mmol/L — ABNORMAL HIGH (ref 0.0–2.0)
Acid-Base Excess: 1 mmol/L (ref 0.0–2.0)
Acid-Base Excess: 6 mmol/L — ABNORMAL HIGH (ref 0.0–2.0)
Acid-Base Excess: 2 mmol/L (ref 0.0–2.0)
Acid-base deficit: 10 mmol/L — ABNORMAL HIGH (ref 0.0–2.0)
Acid-base deficit: 12 mmol/L — ABNORMAL HIGH (ref 0.0–2.0)
Bicarbonate: 15.4 mmol/L — ABNORMAL LOW (ref 20.0–28.0)
Bicarbonate: 18.8 mmol/L — ABNORMAL LOW (ref 20.0–28.0)
Bicarbonate: 32.6 mmol/L — ABNORMAL HIGH (ref 20.0–28.0)
Bicarbonate: 25.6 mmol/L (ref 20.0–28.0)
Bicarbonate: 29.5 mmol/L — ABNORMAL HIGH (ref 20.0–28.0)
Bicarbonate: 25.4 mmol/L (ref 20.0–28.0)
Calcium, Ion: 0.93 mmol/L — ABNORMAL LOW (ref 1.15–1.40)
Calcium, Ion: 1.11 mmol/L — ABNORMAL LOW (ref 1.15–1.40)
Calcium, Ion: 1.15 mmol/L (ref 1.15–1.40)
Calcium, Ion: 1.04 mmol/L — ABNORMAL LOW (ref 1.15–1.40)
Calcium, Ion: 1.04 mmol/L — ABNORMAL LOW (ref 1.15–1.40)
Calcium, Ion: 1.01 mmol/L — ABNORMAL LOW (ref 1.15–1.40)
HCT: 39 % (ref 36.0–46.0)
HCT: 45 % (ref 36.0–46.0)
HCT: 49 % — ABNORMAL HIGH (ref 36.0–46.0)
HCT: 41 % (ref 36.0–46.0)
HCT: 40 % (ref 36.0–46.0)
HCT: 37 % (ref 36.0–46.0)
Hemoglobin: 13.3 g/dL (ref 12.0–15.0)
Hemoglobin: 15.3 g/dL — ABNORMAL HIGH (ref 12.0–15.0)
Hemoglobin: 16.7 g/dL — ABNORMAL HIGH (ref 12.0–15.0)
Hemoglobin: 13.9 g/dL (ref 12.0–15.0)
Hemoglobin: 13.6 g/dL (ref 12.0–15.0)
Hemoglobin: 12.6 g/dL (ref 12.0–15.0)
O2 Saturation: 61 %
O2 Saturation: 74 %
O2 Saturation: 90 %
O2 Saturation: 98 %
O2 Saturation: 100 %
O2 Saturation: 100 %
Patient temperature: 97.7
Patient temperature: 97.6
Potassium: 3.2 mmol/L — ABNORMAL LOW (ref 3.5–5.1)
Potassium: 4.2 mmol/L (ref 3.5–5.1)
Potassium: 6 mmol/L — ABNORMAL HIGH (ref 3.5–5.1)
Potassium: 3.3 mmol/L — ABNORMAL LOW (ref 3.5–5.1)
Potassium: 3.5 mmol/L (ref 3.5–5.1)
Potassium: 3.7 mmol/L (ref 3.5–5.1)
Sodium: 138 mmol/L (ref 135–145)
Sodium: 142 mmol/L (ref 135–145)
Sodium: 150 mmol/L — ABNORMAL HIGH (ref 135–145)
Sodium: 142 mmol/L (ref 135–145)
Sodium: 142 mmol/L (ref 135–145)
Sodium: 143 mmol/L (ref 135–145)
TCO2: 16 mmol/L — ABNORMAL LOW (ref 22–32)
TCO2: 21 mmol/L — ABNORMAL LOW (ref 22–32)
TCO2: 34 mmol/L — ABNORMAL HIGH (ref 22–32)
TCO2: 27 mmol/L (ref 22–32)
TCO2: 31 mmol/L (ref 22–32)
TCO2: 26 mmol/L (ref 22–32)
pCO2 arterial: 32 mmHg (ref 32–48)
pCO2 arterial: 53.6 mmHg — ABNORMAL HIGH (ref 32–48)
pCO2 arterial: 60.3 mmHg — ABNORMAL HIGH (ref 32–48)
pCO2 arterial: 38.7 mmHg (ref 32–48)
pCO2 arterial: 39.1 mmHg (ref 32–48)
pCO2 arterial: 34.1 mmHg (ref 32–48)
pH, Arterial: 7.102 — CL (ref 7.35–7.45)
pH, Arterial: 7.289 — ABNORMAL LOW (ref 7.35–7.45)
pH, Arterial: 7.392 (ref 7.35–7.45)
pH, Arterial: 7.427 (ref 7.35–7.45)
pH, Arterial: 7.486 — ABNORMAL HIGH (ref 7.35–7.45)
pH, Arterial: 7.479 — ABNORMAL HIGH (ref 7.35–7.45)
pO2, Arterial: 35 mmHg — CL (ref 83–108)
pO2, Arterial: 54 mmHg — ABNORMAL LOW (ref 83–108)
pO2, Arterial: 60 mmHg — ABNORMAL LOW (ref 83–108)
pO2, Arterial: 105 mmHg (ref 83–108)
pO2, Arterial: 315 mmHg — ABNORMAL HIGH (ref 83–108)
pO2, Arterial: 165 mmHg — ABNORMAL HIGH (ref 83–108)

## 2022-02-08 LAB — CBC
HCT: 37.1 % (ref 36.0–46.0)
HCT: 40.4 % (ref 36.0–46.0)
Hemoglobin: 12.2 g/dL (ref 12.0–15.0)
Hemoglobin: 13 g/dL (ref 12.0–15.0)
MCH: 29.5 pg (ref 26.0–34.0)
MCH: 28.7 pg (ref 26.0–34.0)
MCHC: 32.9 g/dL (ref 30.0–36.0)
MCHC: 32.2 g/dL (ref 30.0–36.0)
MCV: 89.6 fL (ref 80.0–100.0)
MCV: 89.2 fL (ref 80.0–100.0)
Platelets: 252 10*3/uL (ref 150–400)
Platelets: 258 10*3/uL (ref 150–400)
RBC: 4.14 MIL/uL (ref 3.87–5.11)
RBC: 4.53 MIL/uL (ref 3.87–5.11)
RDW: 17.7 % — ABNORMAL HIGH (ref 11.5–15.5)
RDW: 18 % — ABNORMAL HIGH (ref 11.5–15.5)
WBC: 12 10*3/uL — ABNORMAL HIGH (ref 4.0–10.5)
WBC: 20 10*3/uL — ABNORMAL HIGH (ref 4.0–10.5)
nRBC: 0.4 % — ABNORMAL HIGH (ref 0.0–0.2)
nRBC: 0.3 % — ABNORMAL HIGH (ref 0.0–0.2)

## 2022-02-08 LAB — COMPREHENSIVE METABOLIC PANEL
ALT: 146 U/L — ABNORMAL HIGH (ref 0–44)
ALT: 147 U/L — ABNORMAL HIGH (ref 0–44)
AST: 98 U/L — ABNORMAL HIGH (ref 15–41)
AST: 106 U/L — ABNORMAL HIGH (ref 15–41)
Albumin: 2.7 g/dL — ABNORMAL LOW (ref 3.5–5.0)
Albumin: 2.7 g/dL — ABNORMAL LOW (ref 3.5–5.0)
Alkaline Phosphatase: 113 U/L (ref 38–126)
Alkaline Phosphatase: 121 U/L (ref 38–126)
Anion gap: 8 (ref 5–15)
Anion gap: 10 (ref 5–15)
BUN: 27 mg/dL — ABNORMAL HIGH (ref 6–20)
BUN: 28 mg/dL — ABNORMAL HIGH (ref 6–20)
CO2: 23 mmol/L (ref 22–32)
CO2: 25 mmol/L (ref 22–32)
Calcium: 8.4 mg/dL — ABNORMAL LOW (ref 8.9–10.3)
Calcium: 8.2 mg/dL — ABNORMAL LOW (ref 8.9–10.3)
Chloride: 109 mmol/L (ref 98–111)
Chloride: 104 mmol/L (ref 98–111)
Creatinine, Ser: 1.22 mg/dL — ABNORMAL HIGH (ref 0.44–1.00)
Creatinine, Ser: 1.7 mg/dL — ABNORMAL HIGH (ref 0.44–1.00)
GFR, Estimated: 52 mL/min — ABNORMAL LOW (ref >60)
GFR, Estimated: 35 mL/min — ABNORMAL LOW (ref >60)
Glucose, Bld: 136 mg/dL — ABNORMAL HIGH (ref 70–99)
Glucose, Bld: 220 mg/dL — ABNORMAL HIGH (ref 70–99)
Potassium: 3.9 mmol/L (ref 3.5–5.1)
Potassium: 3.5 mmol/L (ref 3.5–5.1)
Sodium: 140 mmol/L (ref 135–145)
Sodium: 139 mmol/L (ref 135–145)
Total Bilirubin: 1.4 mg/dL — ABNORMAL HIGH (ref 0.3–1.2)
Total Bilirubin: 1.7 mg/dL — ABNORMAL HIGH (ref 0.3–1.2)
Total Protein: 5.4 g/dL — ABNORMAL LOW (ref 6.5–8.1)
Total Protein: 5.6 g/dL — ABNORMAL LOW (ref 6.5–8.1)

## 2022-02-08 LAB — LACTIC ACID, PLASMA
Lactic Acid, Venous: 3.5 mmol/L (ref 0.5–1.9)
Lactic Acid, Venous: 2.6 mmol/L (ref 0.5–1.9)
Lactic Acid, Venous: 1.9 mmol/L (ref 0.5–1.9)

## 2022-02-08 LAB — ECHOCARDIOGRAM COMPLETE
AR max vel: 2.15 cm2
AV Area VTI: 2.1 cm2
AV Area mean vel: 2.05 cm2
AV Mean grad: 4 mmHg
AV Peak grad: 7.8 mmHg
Ao pk vel: 1.4 m/s
Area-P 1/2: 1.55 cm2
Calc EF: 52.7 %
Height: 67 in
MV M vel: 5.02 m/s
MV Peak grad: 100.8 mmHg
MV VTI: 1.36 cm2
Radius: 1 cm
S' Lateral: 2.2 cm
Single Plane A2C EF: 55.9 %
Single Plane A4C EF: 51.4 %
Weight: 2403.2 oz

## 2022-02-08 LAB — COOXEMETRY PANEL
Carboxyhemoglobin: 2.3 % — ABNORMAL HIGH (ref 0.5–1.5)
Carboxyhemoglobin: 2 % — ABNORMAL HIGH (ref 0.5–1.5)
Methemoglobin: 0.7 % (ref 0.0–1.5)
Methemoglobin: <0.7 % (ref 0.0–1.5)
O2 Saturation: 71.6 %
O2 Saturation: 74.7 %
Total hemoglobin: 13.4 g/dL (ref 12.0–16.0)
Total hemoglobin: 12.7 g/dL (ref 12.0–16.0)

## 2022-02-08 LAB — BRAIN NATRIURETIC PEPTIDE: B Natriuretic Peptide: 709.9 pg/mL — ABNORMAL HIGH (ref 0.0–100.0)

## 2022-02-08 LAB — TROPONIN I (HIGH SENSITIVITY)
Troponin I (High Sensitivity): 104 ng/L (ref <18)
Troponin I (High Sensitivity): 155 ng/L (ref <18)
Troponin I (High Sensitivity): 239 ng/L (ref <18)

## 2022-02-08 LAB — HEPARIN LEVEL (UNFRACTIONATED)
Heparin Unfractionated: 0.23 IU/mL — ABNORMAL LOW (ref 0.30–0.70)
Heparin Unfractionated: 0.7 IU/mL (ref 0.30–0.70)

## 2022-02-08 LAB — MRSA NEXT GEN BY PCR, NASAL: MRSA by PCR Next Gen: NOT DETECTED

## 2022-02-08 LAB — MAGNESIUM: Magnesium: 1.6 mg/dL — ABNORMAL LOW (ref 1.7–2.4)

## 2022-02-08 LAB — PHOSPHORUS: Phosphorus: 3.7 mg/dL (ref 2.5–4.6)

## 2022-02-08 SURGERY — ECHOCARDIOGRAM, TRANSESOPHAGEAL
Anesthesia: Monitor Anesthesia Care

## 2022-02-08 MED ORDER — NOREPINEPHRINE 16 MG/250ML-% IV SOLN
0.0000 ug/min | INTRAVENOUS | Status: DC
  Administered 2022-02-08: 38 ug/min via INTRAVENOUS
  Administered 2022-02-09: 5 ug/min via INTRAVENOUS
  Administered 2022-02-09: 29 ug/min via INTRAVENOUS
  Administered 2022-02-10: 8 ug/min via INTRAVENOUS
  Administered 2022-02-11: 15 ug/min via INTRAVENOUS
  Administered 2022-02-12: 9 ug/min via INTRAVENOUS
  Administered 2022-02-14: 20 ug/min via INTRAVENOUS
  Filled 2022-02-08 (×9): qty 250

## 2022-02-08 MED ORDER — PANTOPRAZOLE 2 MG/ML SUSPENSION
40.0000 mg | Freq: Every day | ORAL | Status: DC
  Administered 2022-02-09 – 2022-02-13 (×5): 40 mg
  Filled 2022-02-08 (×6): qty 20

## 2022-02-08 MED ORDER — POTASSIUM CHLORIDE CRYS ER 20 MEQ PO TBCR
20.0000 meq | EXTENDED_RELEASE_TABLET | Freq: Once | ORAL | Status: AC
  Administered 2022-02-08: 20 meq via ORAL
  Filled 2022-02-08: qty 1

## 2022-02-08 MED ORDER — FENTANYL BOLUS VIA INFUSION
50.0000 ug | INTRAVENOUS | Status: DC | PRN
  Administered 2022-02-09: 50 ug via INTRAVENOUS
  Administered 2022-02-09: 100 ug via INTRAVENOUS
  Administered 2022-02-09 (×2): 50 ug via INTRAVENOUS
  Administered 2022-02-10 (×2): 100 ug via INTRAVENOUS
  Administered 2022-02-12 (×4): 50 ug via INTRAVENOUS
  Administered 2022-02-13: 100 ug via INTRAVENOUS

## 2022-02-08 MED ORDER — MIDAZOLAM-SODIUM CHLORIDE 100-0.9 MG/100ML-% IV SOLN
0.0000 mg/h | INTRAVENOUS | Status: DC
  Administered 2022-02-08: 2 mg/h via INTRAVENOUS
  Administered 2022-02-09: 6 mg/h via INTRAVENOUS
  Administered 2022-02-10: 5 mg/h via INTRAVENOUS
  Filled 2022-02-08 (×3): qty 100

## 2022-02-08 MED ORDER — MIDAZOLAM HCL (PF) 5 MG/ML IJ SOLN
INTRAMUSCULAR | Status: AC
  Filled 2022-02-08: qty 1

## 2022-02-08 MED ORDER — FUROSEMIDE 10 MG/ML IJ SOLN
INTRAMUSCULAR | Status: AC
  Filled 2022-02-08: qty 8

## 2022-02-08 MED ORDER — FENTANYL CITRATE (PF) 100 MCG/2ML IJ SOLN: 50.0000 ug | INTRAMUSCULAR | Status: DC | PRN

## 2022-02-08 MED ORDER — EPINEPHRINE HCL 5 MG/250ML IV SOLN IN NS
INTRAVENOUS | Status: DC | PRN
  Administered 2022-02-08: 2 ug/min via INTRAVENOUS

## 2022-02-08 MED ORDER — PANTOPRAZOLE SODIUM 40 MG IV SOLR
40.0000 mg | Freq: Every day | INTRAVENOUS | Status: DC
  Administered 2022-02-08: 40 mg via INTRAVENOUS
  Filled 2022-02-08: qty 10

## 2022-02-08 MED ORDER — FENTANYL 2500MCG IN NS 250ML (10MCG/ML) PREMIX INFUSION
0.0000 ug/h | INTRAVENOUS | Status: DC
  Administered 2022-02-08: 50 ug/h via INTRAVENOUS
  Administered 2022-02-09: 125 ug/h via INTRAVENOUS
  Administered 2022-02-10: 175 ug/h via INTRAVENOUS
  Administered 2022-02-10: 50 ug/h via INTRAVENOUS
  Administered 2022-02-11: 100 ug/h via INTRAVENOUS
  Administered 2022-02-11 – 2022-02-12 (×3): 200 ug/h via INTRAVENOUS
  Administered 2022-02-13: 400 ug/h via INTRAVENOUS
  Administered 2022-02-13: 200 ug/h via INTRAVENOUS
  Administered 2022-02-13: 300 ug/h via INTRAVENOUS
  Administered 2022-02-13: 400 ug/h via INTRAVENOUS
  Administered 2022-02-14: 300 ug/h via INTRAVENOUS
  Filled 2022-02-08 (×13): qty 250

## 2022-02-08 MED ORDER — METOPROLOL TARTRATE 50 MG PO TABS
50.0000 mg | ORAL_TABLET | Freq: Two times a day (BID) | ORAL | Status: DC
  Administered 2022-02-08: 50 mg via ORAL
  Filled 2022-02-08: qty 1
  Filled 2022-02-08: qty 2

## 2022-02-08 MED ORDER — MIDAZOLAM HCL 2 MG/2ML IJ SOLN: 2.0000 mg | INTRAMUSCULAR | Status: DC | PRN

## 2022-02-08 MED ORDER — FUROSEMIDE 10 MG/ML IJ SOLN
20.0000 mg/h | INTRAVENOUS | Status: DC
  Administered 2022-02-08: 20 mg/h via INTRAVENOUS
  Filled 2022-02-08 (×2): qty 20

## 2022-02-08 MED ORDER — LACTATED RINGERS IV SOLN: INTRAVENOUS | Status: DC | PRN

## 2022-02-08 MED ORDER — HEPARIN (PORCINE) 25000 UT/250ML-% IV SOLN
1000.0000 [IU]/h | INTRAVENOUS | Status: DC
Start: 2022-02-08 — End: 2022-02-10
  Administered 2022-02-08: 1100 [IU]/h via INTRAVENOUS
  Administered 2022-02-09: 1000 [IU]/h via INTRAVENOUS
  Filled 2022-02-08 (×2): qty 250

## 2022-02-08 MED ORDER — FUROSEMIDE 10 MG/ML IJ SOLN
80.0000 mg | Freq: Once | INTRAMUSCULAR | Status: AC
  Administered 2022-02-08: 80 mg via INTRAVENOUS

## 2022-02-08 MED ORDER — MIDAZOLAM HCL (PF) 5 MG/ML IJ SOLN
INTRAMUSCULAR | Status: DC | PRN
  Administered 2022-02-08: 2 mg via INTRAVENOUS

## 2022-02-08 MED ORDER — DOCUSATE SODIUM 50 MG/5ML PO LIQD
100.0000 mg | Freq: Two times a day (BID) | ORAL | Status: DC
  Administered 2022-02-08 – 2022-02-13 (×11): 100 mg
  Filled 2022-02-08 (×12): qty 10

## 2022-02-08 MED ORDER — CHLORHEXIDINE GLUCONATE CLOTH 2 % EX PADS
6.0000 | MEDICATED_PAD | Freq: Every day | CUTANEOUS | Status: DC
  Administered 2022-02-08 – 2022-02-19 (×15): 6 via TOPICAL

## 2022-02-08 MED ORDER — POLYETHYLENE GLYCOL 3350 17 G PO PACK
17.0000 g | PACK | Freq: Every day | ORAL | Status: DC
  Administered 2022-02-09 – 2022-02-13 (×5): 17 g
  Filled 2022-02-08 (×6): qty 1

## 2022-02-08 MED ORDER — SODIUM BICARBONATE 8.4 % IV SOLN
INTRAVENOUS | Status: AC
  Filled 2022-02-08: qty 50

## 2022-02-08 MED ORDER — EPINEPHRINE 1 MG/10ML IJ SOSY
PREFILLED_SYRINGE | INTRAMUSCULAR | Status: DC | PRN
  Administered 2022-02-08: 100 ug via INTRAVENOUS
  Administered 2022-02-08: .5 mg via INTRAVENOUS
  Administered 2022-02-08: 900 ug via INTRAVENOUS

## 2022-02-08 MED ORDER — ORAL CARE MOUTH RINSE
15.0000 mL | OROMUCOSAL | Status: DC
  Administered 2022-02-08 – 2022-02-14 (×54): 15 mL via OROMUCOSAL

## 2022-02-08 MED ORDER — POTASSIUM CHLORIDE 10 MEQ/50ML IV SOLN
10.0000 meq | INTRAVENOUS | Status: AC
  Administered 2022-02-08 (×4): 10 meq via INTRAVENOUS
  Filled 2022-02-08 (×4): qty 50

## 2022-02-08 MED ORDER — ROCURONIUM BROMIDE 100 MG/10ML IV SOLN
INTRAVENOUS | Status: DC | PRN
  Administered 2022-02-08: 50 mg via INTRAVENOUS

## 2022-02-08 MED ORDER — SODIUM BICARBONATE 8.4 % IV SOLN
INTRAVENOUS | Status: DC | PRN
  Administered 2022-02-08 (×3): 50 meq via INTRAVENOUS

## 2022-02-08 MED ORDER — AMIODARONE HCL IN DEXTROSE 360-4.14 MG/200ML-% IV SOLN
60.0000 mg/h | INTRAVENOUS | Status: AC
  Administered 2022-02-08: 60 mg/h via INTRAVENOUS

## 2022-02-08 MED ORDER — ETOMIDATE 2 MG/ML IV SOLN
INTRAVENOUS | Status: DC | PRN
  Administered 2022-02-08: 8 mg via INTRAVENOUS

## 2022-02-08 MED ORDER — FENTANYL CITRATE PF 50 MCG/ML IJ SOSY: 50.0000 ug | PREFILLED_SYRINGE | Freq: Once | INTRAMUSCULAR | Status: DC

## 2022-02-08 MED ORDER — FUROSEMIDE 10 MG/ML IJ SOLN
40.0000 mg | Freq: Two times a day (BID) | INTRAMUSCULAR | Status: DC
  Administered 2022-02-08: 40 mg via INTRAVENOUS
  Filled 2022-02-08: qty 4

## 2022-02-08 MED ORDER — PROPOFOL 500 MG/50ML IV EMUL
INTRAVENOUS | Status: DC | PRN
  Administered 2022-02-08: 50 ug/kg/min via INTRAVENOUS

## 2022-02-08 MED ORDER — PROPOFOL 1000 MG/100ML IV EMUL: 0.0000 ug/kg/min | INTRAVENOUS | Status: DC

## 2022-02-08 MED ORDER — MAGNESIUM OXIDE -MG SUPPLEMENT 400 (240 MG) MG PO TABS
400.0000 mg | ORAL_TABLET | Freq: Once | ORAL | Status: AC
  Administered 2022-02-08: 400 mg via ORAL
  Filled 2022-02-08: qty 1

## 2022-02-08 MED ORDER — NOREPINEPHRINE 4 MG/250ML-% IV SOLN
INTRAVENOUS | Status: DC | PRN
  Administered 2022-02-08: 40 ug/min via INTRAVENOUS

## 2022-02-08 MED ORDER — MILRINONE LACTATE IN DEXTROSE 20-5 MG/100ML-% IV SOLN
INTRAVENOUS | Status: DC | PRN
  Administered 2022-02-08: .25 ug/kg/min via INTRAVENOUS

## 2022-02-08 MED ORDER — SUCCINYLCHOLINE CHLORIDE 200 MG/10ML IV SOSY
PREFILLED_SYRINGE | INTRAVENOUS | Status: DC | PRN
  Administered 2022-02-08: 160 mg via INTRAVENOUS

## 2022-02-08 MED ORDER — MILRINONE LACTATE IN DEXTROSE 20-5 MG/100ML-% IV SOLN
0.1250 ug/kg/min | INTRAVENOUS | Status: DC
  Administered 2022-02-09: 0.375 ug/kg/min via INTRAVENOUS
  Administered 2022-02-09 – 2022-02-10 (×3): 0.25 ug/kg/min via INTRAVENOUS
  Administered 2022-02-11 – 2022-02-13 (×3): 0.125 ug/kg/min via INTRAVENOUS
  Filled 2022-02-08 (×7): qty 100

## 2022-02-08 MED ORDER — CHLORHEXIDINE GLUCONATE 0.12% ORAL RINSE (MEDLINE KIT)
15.0000 mL | Freq: Two times a day (BID) | OROMUCOSAL | Status: DC
  Administered 2022-02-08 – 2022-02-14 (×12): 15 mL via OROMUCOSAL

## 2022-02-08 MED ORDER — MIDAZOLAM BOLUS VIA INFUSION
0.0000 mg | INTRAVENOUS | Status: DC | PRN
  Administered 2022-02-09 (×2): 4 mg via INTRAVENOUS
  Administered 2022-02-09: 1 mg via INTRAVENOUS
  Administered 2022-02-10: 2 mg via INTRAVENOUS
  Administered 2022-02-10 (×2): 4 mg via INTRAVENOUS

## 2022-02-08 MED ORDER — AMIODARONE LOAD VIA INFUSION
150.0000 mg | Freq: Once | INTRAVENOUS | Status: AC
  Administered 2022-02-08: 150 mg via INTRAVENOUS
  Filled 2022-02-08: qty 83.34

## 2022-02-08 MED ORDER — AMIODARONE HCL IN DEXTROSE 360-4.14 MG/200ML-% IV SOLN
30.0000 mg/h | INTRAVENOUS | Status: DC
  Administered 2022-02-09 (×2): 30 mg/h via INTRAVENOUS
  Administered 2022-02-10 (×2): 60 mg/h via INTRAVENOUS
  Administered 2022-02-10: 30 mg/h via INTRAVENOUS
  Administered 2022-02-10 – 2022-02-18 (×33): 60 mg/h via INTRAVENOUS
  Administered 2022-02-18 – 2022-02-19 (×2): 30 mg/h via INTRAVENOUS
  Filled 2022-02-08 (×37): qty 200

## 2022-02-08 MED ORDER — MAGNESIUM SULFATE 4 GM/100ML IV SOLN
4.0000 g | Freq: Once | INTRAVENOUS | Status: AC
Start: 2022-02-08 — End: 2022-02-08
  Administered 2022-02-08: 4 g via INTRAVENOUS
  Filled 2022-02-08: qty 100

## 2022-02-08 NOTE — Progress Notes (Signed)
Joneen Roach NP made aware of pts critical lactic acid result.  ?

## 2022-02-08 NOTE — Progress Notes (Addendum)
Planned for TEE today to evaluate her valvular heart disease.  Prior to TEE she developed acute respiratory distress and TEE was canceled.  She was tachypneic with SpO2 down to 60s.  Bilateral crackles on exam and patient felt cold.  IV lasix was given and stat CXR and ABG were ordered.  Anesthesia was present and she ultimately required intubation due to hypoxia.  Dr Haroldine Laws in [place] [facility] and Dr Shearon Stalls in [facility] were called and at bedside. Following intubation had PEA arrest and received ACLS for about 1 minute prior to obtaining ROSC.  She initially required pressors post code but then became hypertensive and was weaned off.  ? ?She was very anxious prior to her TEE and was becoming very hypertensive, suspect this then led to flash pulmonary edema in setting of her severe MR, which led to hypoxic respiratory failure and PEA arrest. She was transferred to [facility].  Plan for aggressive diuresis, she received IV lasix 80 mg x2 in endoscopy, has had 800cc UOP so far.  I updated patient's daughter. ?

## 2022-02-08 NOTE — Anesthesia Procedure Notes (Addendum)
Arterial Line Insertion ?Start/End5/17/2023 1:38 PM, 02/08/2022 1:41 PM ?Performed by: Shelton Silvas, MD, anesthesiologist ? Patient location: OOR procedure area. ?Preanesthetic checklist: patient identified, IV checked, site marked, risks and benefits discussed, surgical consent, monitors and equipment checked, pre-op evaluation, timeout performed and anesthesia consent ?Lidocaine 1% used for infiltration ?Left, radial was placed ?Catheter size: 20 G ?Hand hygiene performed  and maximum sterile barriers used  ? ?Procedure performed without using ultrasound guided technique. ?Ultrasound Notes:anatomy identified, needle tip was noted to be adjacent to the nerve/plexus identified and no ultrasound evidence of intravascular and/or intraneural injection ?Following insertion, Biopatch and dressing applied. ?Post procedure assessment: normal ? ?Post procedure complications: unsuccessful attempts and second provider assisted. ?Patient tolerated the procedure well with no immediate complications. ?Additional procedure comments: MDA x1. ? ? ? ?

## 2022-02-08 NOTE — Progress Notes (Signed)
ANTICOAGULATION CONSULT NOTE - Follow Up Consult ? ?Pharmacy Consult for Heparin infusion ?Indication: atrial fibrillation ? ?No Known Allergies ? ?Patient Measurements: ?  ?Heparin Dosing Weight: 68.1 kg ? ?Vital Signs: ?Temp: 97.7 ?F (36.5 ?C) (05/17 1236) ?Temp Source: Oral (05/17 1236) ?BP: 124/91 (05/17 1236) ?Pulse Rate: 81 (05/17 1236) ? ?Labs: ?Recent Labs  ?  02/07/22 ?1749 02/07/22 ?1812 02/07/22 ?2131 02/08/22 ?0351 02/08/22 ?1150  ?HGB 13.7 14.6  --  12.2  --   ?HCT 42.2 43.0  --  37.1  --   ?PLT 285  --   --  252  --   ?LABPROT  --   --  15.7*  --   --   ?INR  --   --  1.3*  --   --   ?HEPARINUNFRC  --   --   --  0.23* 0.70  ?CREATININE 1.35* 1.20*  --  1.22*  --   ?TROPONINIHS 29*  --  27*  --   --   ? ? ?Estimated Creatinine Clearance: 49.5 mL/min (A) (by C-G formula based on SCr of 1.22 mg/dL (H)). ? ? ?Medications:  ?Medications Prior to Admission  ?Medication Sig Dispense Refill Last Dose  ? aspirin EC 81 MG tablet Take 81 mg by mouth daily. Swallow whole.   02/07/2022  ? metoprolol succinate (TOPROL-XL) 50 MG 24 hr tablet TAKE 1 TABLET BY MOUTH DAILY. TAKE WITH OR IMMEDIATELY FOLLOWING A MEAL. 90 tablet 3 02/06/2022 at 2400  ? ? ?Assessment: ?14 yof with a history of SVT, mitral regurg TR, AI, anxiety. Patient is presenting with tachycardia. Heparin per pharmacy consult placed for atrial fibrillation. ?  ?Patient is not on anticoagulation prior to arrival. ? ?Heparin level 0.7, therapeutic ?Current heparin infusion rate: 1200 units/hr ?No issues with infusion overnight. ?No s/sx of bleeding ? ?Goal of Therapy:  ?Heparin level 0.3-0.7 units/ml ?Monitor platelets by anticoagulation protocol: Yes ?  ?Plan:  ?Decrease heparin infusion rate to 1100 units/hr ?Check heparin level in 6 hours ?Daily CBC and heparin levels ?Monitor for s/sx of bleeding  ? ?Kaleen Mask ?02/08/2022,1:14 PM ? ? ?

## 2022-02-08 NOTE — Progress Notes (Signed)
ANTICOAGULATION CONSULT NOTE - Follow Up Consult ? ?Pharmacy Consult for heparin ?Indication: atrial fibrillation ? ?Labs: ?Recent Labs  ?  02/07/22 ?1749 02/07/22 ?1812 02/07/22 ?2131 02/08/22 ?0351  ?HGB 13.7 14.6  --  12.2  ?HCT 42.2 43.0  --  37.1  ?PLT 285  --   --  252  ?LABPROT  --   --  15.7*  --   ?INR  --   --  1.3*  --   ?HEPARINUNFRC  --   --   --  0.23*  ?CREATININE 1.35* 1.20*  --  1.22*  ?TROPONINIHS 29*  --  27*  --   ? ? ?Assessment: ?56yo female subtherapeutic on heparin with initial dosing for Afib/flutter; no infusion issues or signs of bleeding per RN. ? ?Goal of Therapy:  ?Heparin level 0.3-0.7 units/ml ?  ?Plan:  ?Will increase heparin infusion by 3 units/kg/hr to 1200 units/hr and check level in 6 hours.   ? ?Wynona Neat, PharmD, BCPS  ?02/08/2022,4:46 AM ? ? ?

## 2022-02-08 NOTE — Anesthesia Procedure Notes (Signed)
Central Venous Catheter Insertion ?Performed by: Gaynelle Adu, MD, anesthesiologist ?Start/End5/17/2023 1:55 PM, 02/08/2022 2:00 PM ?Patient location: Pre-op. ?Preanesthetic checklist: patient identified, IV checked, site marked, risks and benefits discussed, surgical consent, monitors and equipment checked, pre-op evaluation, timeout performed and anesthesia consent ?Position: Trendelenburg ?Lidocaine 1% used for infiltration and patient sedated ?Hand hygiene performed , maximum sterile barriers used  and Seldinger technique used ?Catheter size: 7 Fr ?Total catheter length 16. ?Central line was placed.Triple lumen ?Procedure performed using ultrasound guided technique. ?Ultrasound Notes:anatomy identified, needle tip was noted to be adjacent to the nerve/plexus identified, no ultrasound evidence of intravascular and/or intraneural injection and image(s) printed for medical record ?Attempts: 1 ?Following insertion, dressing applied, line sutured and Biopatch. ?Post procedure assessment: blood return through all ports ? ?Patient tolerated the procedure well with no immediate complications. ? ? ? ? ?

## 2022-02-08 NOTE — Progress Notes (Signed)
?  Amiodarone Drug - Drug Interaction Consult Note ? ?56 yo F admitted for atrial flutter with RVR. Pt converted to NSR overnight, however, flipped back into AFib w/ RVR later this morning. Cardiology consulted and recommended to load with IV amiodarone. Pharmacy consulted to evaluate DDI with amiodarone and current and home meds.  ? ?Recommendations: ?No changes needed to current medications. Be sure to monitor and maintain K > 4.0 and Mag > 2.0 while receiving IV diuretics and on amiodarone.  ?  ?Amiodarone is metabolized by the cytochrome P450 system and therefore has the potential to cause many drug interactions. Amiodarone has an average plasma half-life of 50 days (range 20 to 100 days).  ? ?There is potential for drug interactions to occur several weeks or months after stopping treatment and the onset of drug interactions may be slow after initiating amiodarone.  ? ?[]  Statins: Increased risk of myopathy. Simvastatin- restrict dose to 20mg  daily. ?Other statins: counsel patients to report any muscle pain or weakness immediately. ? ?[]  Anticoagulants: Amiodarone can increase anticoagulant effect. Consider warfarin dose reduction. Patients should be monitored closely and the dose of anticoagulant altered accordingly, remembering that amiodarone levels take several weeks to stabilize. ? ?[]  Antiepileptics: Amiodarone can increase plasma concentration of phenytoin, the dose should be reduced. Note that small changes in phenytoin dose can result in large changes in levels. Monitor patient and counsel on signs of toxicity. ? ?[x]  Beta blockers: increased risk of bradycardia, AV block and myocardial depression. Sotalol - avoid concomitant use. ? ?[]   Calcium channel blockers (diltiazem and verapamil): increased risk of bradycardia, AV block and myocardial depression. ? ?[]   Cyclosporine: Amiodarone increases levels of cyclosporine. Reduced dose of cyclosporine is recommended. ? ?[]  Digoxin dose should be halved when  amiodarone is started. ? ?[x]  Diuretics: increased risk of cardiotoxicity if hypokalemia occurs. ? ?[]  Oral hypoglycemic agents (glyburide, glipizide, glimepiride): increased risk of hypoglycemia. Patient's glucose levels should be monitored closely when initiating amiodarone therapy.  ? ?[]  Drugs that prolong the QT interval:  Torsades de pointes risk may be increased with concurrent use - avoid if possible.  Monitor QTc, also keep magnesium/potassium WNL if concurrent therapy can't be avoided. ? Antibiotics: e.g. fluoroquinolones, erythromycin. ? Antiarrhythmics: e.g. quinidine, procainamide, disopyramide, sotalol. ? Antipsychotics: e.g. phenothiazines, haloperidol. ?  Lithium, tricyclic antidepressants, and methadone. ? ?Thank You,  ?  ?02/08/2022 10:49 AM ? ? ? ? ?

## 2022-02-08 NOTE — ED Notes (Signed)
Hospitalist messaged about patients oxygen sats dropping in to 80's.  Patient oxygen increased to 3L [place] ?

## 2022-02-08 NOTE — Progress Notes (Signed)
At 1325 nrb applied, difficult to obtain accurate PO . ?1352 patient intubated ?1354 compressions started.  ?1355 compressions stopped. Good femoral pulse. Medications and vs documented by anesthesia. Foley cath placed, central line placed, art line placed, patient p;aced on vent and transported to 2 heart room 17. Code ran by Dr Gala Romney, family notified by Dr Bjorn Pippin ?

## 2022-02-08 NOTE — ED Notes (Signed)
Patient ambulated to bathroom and became very winded and oxygen level dropped to 86%.  Placed back on her 2L [place] ?

## 2022-02-08 NOTE — Anesthesia Preprocedure Evaluation (Signed)
Anesthesia Evaluation  ?Patient identified by MRN, date of birth, ID band ?Patient awake ? ? ? ?Reviewed: ?Allergy & Precautions, NPO status , Patient's Chart, lab work & pertinent test results ? ?Airway ?Mallampati: I ? ?TM Distance: >3 FB ?Neck ROM: Full ? ? ? Dental ? ?(+) Poor Dentition, Missing, Loose, Chipped ?  ?Pulmonary ? ?  ? ?+ decreased breath sounds ? ? ? ? ? Cardiovascular ?+CHF  ?+ Valvular Problems/Murmurs MR  ?Rhythm:Regular Rate:Normal ? ? ?  ?Neuro/Psych ?  ? GI/Hepatic ?negative GI ROS, Neg liver ROS,   ?Endo/Other  ?negative endocrine ROS ? Renal/GU ?negative Renal ROS  ? ?  ?Musculoskeletal ?negative musculoskeletal ROS ?(+)  ? Abdominal ?Normal abdominal exam  (+)   ?Peds ? Hematology ?negative hematology ROS ?(+)   ?Anesthesia Other Findings ? ? Reproductive/Obstetrics ? ?  ? ? ? ? ? ? ? ? ? ? ? ? ? ?  ?  ? ? ? ? ? ? ? ? ?Anesthesia Physical ?Anesthesia Plan ? ?ASA: 3 ? ?Anesthesia Plan: MAC  ? ?Post-op Pain Management:   ? ?Induction: Intravenous ? ?PONV Risk Score and Plan: 0 and Propofol infusion ? ?Airway Management Planned: Simple Face Mask and Natural Airway ? ?Additional Equipment: None ? ?Intra-op Plan:  ? ?Post-operative Plan:  ? ?Informed Consent: I have reviewed the patients History and Physical, chart, labs and discussed the procedure including the risks, benefits and alternatives for the proposed anesthesia with the patient or authorized representative who has indicated his/her understanding and acceptance.  ? ? ? ?Dental advisory given ? ?Plan Discussed with: CRNA ? ?Anesthesia Plan Comments:   ? ? ? ? ? ? ?Anesthesia Quick Evaluation ? ?

## 2022-02-08 NOTE — Progress Notes (Signed)
?  Echocardiogram ?2D Echocardiogram has been performed. ? ?Gerda Diss ?02/08/2022, 5:41 PM ?

## 2022-02-08 NOTE — Transfer of Care (Signed)
Immediate Anesthesia Transfer of Care Note ? ?Patient: Lisa Ayala ? ?Procedure(s) Performed: TRANSESOPHAGEAL ECHOCARDIOGRAM (TEE) ?INVASIVE LAB ABORTED CASE ? ?Patient Location: ICU ? ?Anesthesia Type:General ? ?Level of Consciousness: sedated and Patient remains intubated per anesthesia plan ? ?Airway & Oxygen Therapy: Patient remains intubated per anesthesia plan ? ?Post-op Assessment: Report given to RN and Post -op Vital signs reviewed and stable ? ?Post vital signs: Reviewed and stable ? ?Last Vitals:  ?Vitals Value Taken Time  ?BP 144/78   ?Temp    ?Pulse 88   ?Resp 24   ?SpO2 95%   ? ? ?Last Pain:  ?Vitals:  ? 02/08/22 1236  ?TempSrc: Oral  ?PainSc: 0-No pain  ?   ? ?  ? ?Complications: No notable events documented. ?

## 2022-02-08 NOTE — ED Notes (Signed)
Pharmacy called and instructed to increase heparin rate to 27ml/hr ?

## 2022-02-08 NOTE — H&P (View-Only) (Signed)
? ?Progress Note ? ?Patient Name: Lisa Ayala ?Date of Encounter: 02/08/2022 ? ?[facility] Cardiologist: Will Meredith Leeds, MD  ? ?Subjective  ? ?Converted to sinus rhythm overnight.  SPO2 dropped to 80s overnight, started on 3 L [place].  SPO2 currently 100%.  BP 127/92.  Denies dyspnea or chest pain this morning ? ?Inpatient Medications  ?  ?Scheduled Meds: ? furosemide  40 mg Intravenous BID  ? metoprolol tartrate  50 mg Oral BID  ? ?Continuous Infusions: ? heparin 1,200 Units/hr (02/08/22 0450)  ? ?PRN Meds: ?acetaminophen, ondansetron (ZOFRAN) IV  ? ?Vital Signs  ?  ?Vitals:  ? 02/08/22 0745 02/08/22 0800 02/08/22 0815 02/08/22 0845  ?BP: (!) 126/93 121/87 128/82 (!) 127/92  ?Pulse: 78 71 76 74  ?Resp: 18 (!) 24 (!) 30 (!) 26  ?Temp:      ?TempSrc:      ?SpO2: 90% 98% 99% 100%  ? ? ?Intake/Output Summary (Last 24 hours) at 02/08/2022 0857 ?Last data filed at 02/08/2022 [id number] ?Gross per 24 hour  ?Intake 206.46 ml  ?Output --  ?Net 206.46 ml  ? ? ?  02/07/2022  ?  3:41 PM 07/26/2021  ? 11:05 AM  ?Last 3 Weights  ?Weight (lbs) 150 lb 3.2 oz 139 lb  ?Weight (kg) 68.13 kg 63.05 kg  ?   ? ?Telemetry  ?  ?Normal sinus rhythm with rate 70s to 80s- Personally Reviewed ? ?ECG  ?  ?Normal sinus rhythm, rate 76, right bundle branch block, left posterior fascicular block, QTc 509, 1 mm ST depressions in inferior leads- Personally Reviewed ? ?Physical Exam  ? ?GEN: No acute distress.   ?Neck: + JVD ?Cardiac: RRR, 3/6 holosystolic murmur ?Respiratory: Bibasilar crackles ?GI: Soft, nontender, non-distended  ?MS: No edema; No deformity. ?Neuro:  Nonfocal  ?Psych: Normal affect  ? ?Labs  ?  ?High Sensitivity Troponin:   ?Recent Labs  ?Lab 02/07/22 ?1749 02/07/22 ?2131  ?TROPONINIHS 29* 27*  ?   ?Chemistry ?Recent Labs  ?Lab 02/07/22 ?1749 02/07/22 ?1812 02/07/22 ?2131 02/08/22 ?0351  ?NA 137 138  --  140  ?K 4.5 4.6  --  3.9  ?CL 108 109  --  109  ?CO2 19*  --   --  23  ?GLUCOSE 120* 115*  --  136*  ?BUN 27* 35*  --  27*   ?CREATININE 1.35* 1.20*  --  1.22*  ?CALCIUM 9.1  --   --  8.4*  ?MG  --   --  1.9  --   ?PROT 6.3*  --   --  5.4*  ?ALBUMIN 3.2*  --   --  2.7*  ?AST 122*  --   --  98*  ?ALT 169*  --   --  146*  ?ALKPHOS 125  --   --  113  ?BILITOT 1.9*  --   --  1.4*  ?GFRNONAA 46*  --   --  52*  ?ANIONGAP 10  --   --  8  ?  ?Lipids No results for input(s): CHOL, TRIG, HDL, LABVLDL, LDLCALC, CHOLHDL in the last 168 hours.  ?Hematology ?Recent Labs  ?Lab 02/07/22 ?1749 02/07/22 ?1812 02/08/22 ?0351  ?WBC 10.1  --  12.0*  ?RBC 4.72  --  4.14  ?HGB 13.7 14.6 12.2  ?HCT 42.2 43.0 37.1  ?MCV 89.4  --  89.6  ?MCH 29.0  --  29.5  ?MCHC 32.5  --  32.9  ?RDW 17.7*  --  17.7*  ?PLT 285  --  252  ? ?Thyroid  ?Recent Labs  ?Lab 02/07/22 ?2131  ?TSH 1.569  ?FREET4 1.43*  ?  ?BNPNo results for input(s): BNP, PROBNP in the last 168 hours.  ?DDimer No results for input(s): DDIMER in the last 168 hours.  ? ?Radiology  ?  ?DG Chest Port 1 View ? ?Result Date: 02/07/2022 ?CLINICAL DATA:  Tachycardia. EXAM: PORTABLE CHEST 1 VIEW COMPARISON:  Chest x-ray 07/02/2021. FINDINGS: Heart is enlarged, unchanged. There are small bilateral pleural effusions. There is some strandy opacities in the lung bases. No pneumothorax or acute fracture. IMPRESSION: 1. Cardiomegaly with small pleural effusions and minimal bibasilar atelectasis/edema. Electronically Signed   By: Ronney Asters M.D.   On: 02/07/2022 19:45   ? ?Cardiac Studies  ? ? ? ?Patient Profile  ?   ?56 y.o. female with a history of SVT, severe mitral regurgitation, moderate TR, mild to moderate AI, and anxiety, who presented to the ED secondary to a 11-day history of persistent tachycardia ? ?Assessment & Plan  ?  ?Atrial flutter: Seen in EP clinic 5/16, was in tachyarrhythmia.  Has history of SVT.  Unclear if SVT versus atrial flutter, was sent to ED.  Given adenosine and flutter waves seen.  Started on heparin drip.  She converted to normal sinus rhythm overnight ?-Suspect due to valvular disease as  below.  Planning TEE for further evaluation of valvular disease today ?-Continue metoprolol 50 mg twice daily ?-Continue heparin drip for now.  We will follow-up results of TEE, if suspected valvular AF will need to use warfarin for anticoagulation ? ?Addendum: went back into Aflutter with RVR this morning.  Will start amio gtt to maintain sinus rhythm. ? ?Valvular disease: Echocardiogram 08/25/2021 concerning for rheumatic heart disease with severe MR, mild MS, at least moderate TR, at least mild to moderate AI ?-TEE today for further evaluation ? ?Acute on chronic diastolic heart failure: Appears volume overloaded on exam.  SPO2 to 80s overnight, started on 3 L [place]. ?-Start IV Lasix 40 mg twice daily ?-Strict I's/O's and daily weights ? ?Demand ischemia: Troponin 29 > 27.  Suspect demand ischemia in setting of tachyarrhythmia.  Denies any chest pain ? ?Transaminitis: AST 122, ALT 169 on admission.  Downtrending, will monitor ? ?Substance abuse: UDS positive for cocaine and THC.  She reports she smokes marijuana daily, but adamantly denies any cocaine use.  Cessation recommended ? ? ?For questions or updates, please contact [place] ?Please consult [email or web address] for contact info under  ? ?  ?   ?Signed, ?Donato Heinz, MD  ?02/08/2022, 8:57 AM   ? ?

## 2022-02-08 NOTE — CV Procedure (Signed)
Patient presented for transesophageal echocardiogram.  Noted to be very short of breath.  Initial saturations in the 70s.  She is noticeably diaphoretic and unable to calm down.  Her work of breathing is increased.  Crackles and rails noted on examination bilaterally.  Transesophageal echo canceled due to likely worsening mitral valve regurgitation.  Plan for ABG, arterial line, chest x-ray.  If oxygen saturations remain in the 70s she will need intubation.  Critical care medicine discussed case.  Discussed case with Dr. Epifanio Lesches.  ? ?Gerri Spore T. Flora Lipps, MD, [facility] ?[place]  [facility]  ?[address], [address] ?[place], [place] [address] ?([phone]  ?1:35 PM ? ?

## 2022-02-08 NOTE — Consult Note (Addendum)
?  ?Advanced Heart Failure Team Consult Note ? ? ?Primary Physician: Pcp, No ?PCP-Cardiologist:  Will Meredith Leeds, MD ? ?Reason for Consultation:  Heart Failure  ? ?HPI:   ? ?Lisa Ayala is seen today for evaluation of heart failure at the request of Dr Gilman Schmidt.  ? ?Ms Zevenbergen is a 56 year old with a history of SVT and severe MR. Echo 08/2021 LVEF 70-75% with severe MR, mild MS, at least Mod TR and planned for TEE ? ?Followed by EP for SVT. She was seen 07/2021. TEE had been planned to evaluate MR. She no showed for TEE.   ? ? Yesterday she was seen by EP and reported increased shortness of breath and brief tachycardia. She was found to  be in SVT versus  A flutter 145 bpm. She was sent to the ED. ?In the ED she was given adenosine 6, 12, and 12. Brief slowing with flutter waves. Given metoprolol.  TEE was scheduled.TEE aborted due to flash pulmonary edema.  Advanced Heart Failure Team urgently consulted. PEA arrest in Endo. CPR , Epi, and  bicarb given with ROSC. Urgent intubation. Given IV lasix. Transferred to ICU.  ? ?Review of Systems: [y] = yes, [ ]  = no  ? ?General: Weight gain [ ] ; Weight loss [ ] ; Anorexia [ ] ; Fatigue [ ] ; Fever [ ] ; Chills [ ] ; Weakness [ ]   ?Cardiac: Chest pain/pressure [ ] ; Resting SOB [ Y]; Exertional SOB [ Y]; Orthopnea [ ] ; Pedal Edema [ ] ; Palpitations [ ] ; Syncope [ ] ; Presyncope [ ] ; Paroxysmal nocturnal dyspnea[ ]   ?Pulmonary: Cough [ ] ; Wheezing[ ] ; Hemoptysis[ ] ; Sputum [ ] ; Snoring [ ]   ?GI: Vomiting[ ] ; Dysphagia[ ] ; Melena[ ] ; Hematochezia [ ] ; Heartburn[ ] ; Abdominal pain [ ] ; Constipation [ ] ; Diarrhea [ ] ; BRBPR [ ]   ?GU: Hematuria[ ] ; Dysuria [ ] ; Nocturia[ ]   ?Vascular: Pain in legs with walking [ ] ; Pain in feet with lying flat [ ] ; Non-healing sores [ ] ; Stroke [ ] ; TIA [ ] ; Slurred speech [ ] ;  ?Neuro: Headaches[ ] ; Vertigo[ ] ; Seizures[ ] ; Paresthesias[ ] ;Blurred vision [ ] ; Diplopia [ ] ; Vision changes [ ]   ?Ortho/Skin: Arthritis [ ] ; Joint pain [ Y];  Muscle pain [ ] ; Joint swelling [ ] ; Back Pain [Y ]; Rash [ ]   ?Psych: Depression[ ] ; Anxiety[ ]   ?Heme: Bleeding problems [ ] ; Clotting disorders [ ] ; Anemia [ ]   ?Endocrine: Diabetes [ ] ; Thyroid dysfunction[ ]  ? ?Home Medications ?Prior to Admission medications   ?Medication Sig Start Date End Date Taking? Authorizing Provider  ?aspirin EC 81 MG tablet Take 81 mg by mouth daily. Swallow whole.   Yes [provider]  ?metoprolol succinate (TOPROL-XL) 50 MG 24 hr tablet TAKE 1 TABLET BY MOUTH DAILY. TAKE WITH OR IMMEDIATELY FOLLOWING A MEAL. 11/21/21  Yes Camnitz, Ocie Doyne, MD  ? ? ?Past Medical History: ?Past Medical History:  ?Diagnosis Date  ? Aortic insufficiency   ? PSVT (paroxysmal supraventricular tachycardia) ([place])   ? Severe mitral regurgitation   ? a. 08/2021 Echo: EF 70-75%, no rwma, mod dil LV, GrII DD, nl RV fxn, massively dil LA, mild-mod dil RA, severe MR due to lack of central coaptation of MV leaflets. Mild MS. Mod TR. Mild-mod AI.  ? Tricuspid regurgitation   ? ? ?Past Surgical History: ?History reviewed. No pertinent surgical history. ? ?Family History: ?Family History  ?Family history unknown: Yes  ? ? ?Social History: ?Social History  ? ?  Socioeconomic History  ? Marital status: Divorced  ?  Spouse name: Not on file  ? Number of children: Not on file  ? Years of education: Not on file  ? Highest education level: Not on file  ?Occupational History  ? Not on file  ?Tobacco Use  ? Smoking status: Never  ? Smokeless tobacco: Never  ?Substance and Sexual Activity  ? Alcohol use: Not Currently  ? Drug use: Yes  ?  Types: Marijuana  ? Sexual activity: Not on file  ?Other Topics Concern  ? Not on file  ?Social History Narrative  ? Lives locally.  Relatively active.  ? ?Social Determinants of Health  ? ?Financial Resource Strain: Not on file  ?Food Insecurity: Not on file  ?Transportation Needs: Not on file  ?Physical Activity: Not on file  ?Stress: Not on file  ?Social Connections: Not on  file  ? ? ?Allergies:  ?No Known Allergies ? ?Objective:   ? ?Vital Signs:   ?Temp:  [97.5 ?F (36.4 ?C)-98.7 ?F (37.1 ?C)] 97.7 ?F (36.5 ?C) (05/17 1236) ?Pulse Rate:  [39-168] 81 (05/17 1236) ?Resp:  [11-44] 31 (05/17 1236) ?BP: (96-173)/(79-130) 124/91 (05/17 1236) ?SpO2:  [85 %-100 %] 93 % (05/17 1236) ?  ? ?Weight change: ?There were no vitals filed for this visit. ? ?Intake/Output:  ? ?Intake/Output Summary (Last 24 hours) at 02/08/2022 1500 ?Last data filed at 02/08/2022 1109 ?Gross per 24 hour  ?Intake 206.46 ml  ?Output 850 ml  ?Net -643.54 ml  ?  ? ? ?Physical Exam  ?  ?General:  Intubated ?HEENT: ETT ?Neck: supple. JVP to jaw . Carotids 2+ bilat; no bruits. No lymphadenopathy or thyromegaly appreciated. ?Cor: PMI nondisplaced. Regular rate & rhythm. No rubs, gallops or murmurs. ?Lungs: Crackles  ?Abdomen: soft, nontender, nondistended. No hepatosplenomegaly. No bruits or masses. Good bowel sounds. ?Extremities: no cyanosis, clubbing, rash, edema ?Neuro: Intubated ? ? ?Telemetry  ? ?SR ? ?EKG  ? On admit A flutter RVR ? ?Labs  ? ?Basic Metabolic Panel: ?Recent Labs  ?Lab 02/07/22 ?1749 02/07/22 ?1812 02/07/22 ?2131 02/08/22 ?0351  ?NA 137 138  --  140  ?K 4.5 4.6  --  3.9  ?CL 108 109  --  109  ?CO2 19*  --   --  23  ?GLUCOSE 120* 115*  --  136*  ?BUN 27* 35*  --  27*  ?CREATININE 1.35* 1.20*  --  1.22*  ?CALCIUM 9.1  --   --  8.4*  ?MG  --   --  1.9  --   ? ? ?Liver Function Tests: ?Recent Labs  ?Lab 02/07/22 ?1749 02/08/22 ?0351  ?AST 122* 98*  ?ALT 169* 146*  ?ALKPHOS 125 113  ?BILITOT 1.9* 1.4*  ?PROT 6.3* 5.4*  ?ALBUMIN 3.2* 2.7*  ? ?No results for input(s): LIPASE, AMYLASE in the last 168 hours. ?No results for input(s): AMMONIA in the last 168 hours. ? ?CBC: ?Recent Labs  ?Lab 02/07/22 ?1749 02/07/22 ?1812 02/08/22 ?0351  ?WBC 10.1  --  12.0*  ?NEUTROABS 6.9  --   --   ?HGB 13.7 14.6 12.2  ?HCT 42.2 43.0 37.1  ?MCV 89.4  --  89.6  ?PLT 285  --  252  ? ? ?Cardiac Enzymes: ?No results for input(s):  CKTOTAL, CKMB, CKMBINDEX, TROPONINI in the last 168 hours. ? ?BNP: ?BNP (last 3 results) ?Recent Labs  ?  07/02/21 ?1335 02/08/22 ?0351  ?BNP 1,157.2* 709.9*  ? ? ?ProBNP (last 3 results) ?No results for  input(s): PROBNP in the last 8760 hours. ? ? ?CBG: ?No results for input(s): GLUCAP in the last 168 hours. ? ?Coagulation Studies: ?Recent Labs  ?  02/07/22 ?2131  ?LABPROT 15.7*  ?INR 1.3*  ? ? ? ?Imaging  ? ?DG Chest Port 1 View ? ?Result Date: 02/07/2022 ?CLINICAL DATA:  Tachycardia. EXAM: PORTABLE CHEST 1 VIEW COMPARISON:  Chest x-ray 07/02/2021. FINDINGS: Heart is enlarged, unchanged. There are small bilateral pleural effusions. There is some strandy opacities in the lung bases. No pneumothorax or acute fracture. IMPRESSION: 1. Cardiomegaly with small pleural effusions and minimal bibasilar atelectasis/edema. Electronically Signed   By: Ronney Asters M.D.   On: 02/07/2022 19:45   ? ? ?Medications:   ? ? ?Current Medications: ? docusate  100 mg Per Tube BID  ? [MAR Hold] furosemide  40 mg Intravenous BID  ? [MAR Hold] metoprolol tartrate  50 mg Oral BID  ? pantoprazole (PROTONIX) IV  40 mg Intravenous Daily  ? pantoprazole sodium  40 mg Per Tube Daily  ? polyethylene glycol  17 g Per Tube Daily  ? ? ?Infusions: ? amiodarone 60 mg/hr (02/08/22 1305)  ? Followed by  ? amiodarone    ? heparin 1,200 Units/hr (02/08/22 0450)  ? propofol (DIPRIVAN) infusion    ? ? ? ? ?Patient Profile  ? ? ?Ms Caughron is a 56 year old with a history of SVT and severe MR. Admitted with A f ?Assessment/Plan  ? ?1. PEA Arrest ?-ACLS x1 min with ROSC ?-Intubated.  ? ?2. Acute Hypoxic Respiratory Failure  ?-Urgent intubation ? ?3. Severe MR  ?-Plan for TEE today but aborted due PEA arrest.  ?- Eventual TEE ? ?4. PAF ?-In Aflutter on admit.  ?-Spontaneously converted to SR.  ?-On heparin drip.  ? ?5. SVT ?Followed by Dr Curt Bears ? ?Transferred to ICU. Need TEE once improved.  ? ? ? ?Length of Stay: 1 ? ?Darrick Grinder, NP  ?02/08/2022, 3:00  PM ? ?Advanced Heart Failure Team ?Pager [phone] (M-F; 7a - 5p)  ?Please contact [facility] for night-coverage after hours (4p -7a ) and weekends on [email or web address] ? ?Agree with above.   ? ?56 y/o woman with severe MR d

## 2022-02-08 NOTE — Interval H&P Note (Signed)
History and Physical Interval Note: ? ?02/08/2022 ?12:41 PM ? ?SHAMARIAH SHEWMAKE  has presented today for surgery, with the diagnosis of a flutter.  The various methods of treatment have been discussed with the patient and family. After consideration of risks, benefits and other options for treatment, the patient has consented to  Procedure(s): ?TRANSESOPHAGEAL ECHOCARDIOGRAM (TEE) (N/A) as a surgical intervention.  The patient's history has been reviewed, patient examined, no change in status, stable for surgery.  I have reviewed the patient's chart and labs.  Questions were answered to the patient's satisfaction.   ? ? ?Severe MR/TR. NPO. Rheumatic MV.  ? ?Gerri Spore T. Flora Lipps, MD, [facility] ?[place]  [facility]  ?[address], [address] ?[place], [place] [address] ?([phone]  ?12:42 PM ? ?

## 2022-02-08 NOTE — Progress Notes (Signed)
ANTICOAGULATION CONSULT NOTE - Follow Up Consult ? ?Pharmacy Consult for Heparin infusion ?Indication: atrial fibrillation ? ?No Known Allergies ? ?Patient Measurements: ?  ?Heparin Dosing Weight: 68.1 kg ? ?Vital Signs: ?Temp: 97.7 ?F (36.5 ?C) (05/17 1236) ?Temp Source: Oral (05/17 1236) ?BP: 124/91 (05/17 1236) ?Pulse Rate: 81 (05/17 1236) ? ?Labs: ?Recent Labs  ?  02/07/22 ?1749 02/07/22 ?1812 02/07/22 ?2131 02/08/22 ?0351 02/08/22 ?1150 02/08/22 ?1344 02/08/22 ?1407 02/08/22 ?1424 02/08/22 ?1530  ?HGB 13.7 14.6  --  12.2  --    < > 15.3* 13.3 13.9  ?HCT 42.2 43.0  --  37.1  --    < > 45.0 39.0 41.0  ?PLT 285  --   --  252  --   --   --   --   --   ?LABPROT  --   --  15.7*  --   --   --   --   --   --   ?INR  --   --  1.3*  --   --   --   --   --   --   ?HEPARINUNFRC  --   --   --  0.23* 0.70  --   --   --   --   ?CREATININE 1.35* 1.20*  --  1.22*  --   --   --   --   --   ?TROPONINIHS 29*  --  27*  --   --   --   --   --   --   ? < > = values in this interval not displayed.  ? ? ? ?Estimated Creatinine Clearance: 49.5 mL/min (A) (by C-G formula based on SCr of 1.22 mg/dL (H)). ? ? ?Medications:  ?Medications Prior to Admission  ?Medication Sig Dispense Refill Last Dose  ? aspirin EC 81 MG tablet Take 81 mg by mouth daily. Swallow whole.   02/07/2022  ? metoprolol succinate (TOPROL-XL) 50 MG 24 hr tablet TAKE 1 TABLET BY MOUTH DAILY. TAKE WITH OR IMMEDIATELY FOLLOWING A MEAL. 90 tablet 3 02/06/2022 at 2400  ? ? ?Assessment: ?46 yof with a history of SVT, mitral regurg TR, AI, anxiety. Patient is presenting with tachycardia. Heparin per pharmacy consult placed for atrial fibrillation. Patient is not on anticoagulation prior to arrival.  ? ?Heparin was turned off for TEE this morning. TEE was unable to be completed and patient required intubation. Discussed with HF and CCM and will resume heparin now. No signs of bleeding noted per RN.  ? ? ?Goal of Therapy:  ?Heparin level 0.3-0.7 units/ml ?Monitor platelets by  anticoagulation protocol: Yes ?  ?Plan:  ?Resume heparin at 1100 units/hr  ?Check 6 hr heparin level  ?Monitor heparin level, CBC and s/s of bleeding  ? ?Cristela Felt, PharmD, BCPS ?Clinical Pharmacist ?02/08/2022 4:33 PM ? ? ? ?

## 2022-02-08 NOTE — ED Notes (Signed)
Patient's daughter called requesting an update. This RN called back and gave her a brief update on the pt.  ?

## 2022-02-08 NOTE — ED Notes (Signed)
Called 2C for purple man ?

## 2022-02-08 NOTE — Progress Notes (Addendum)
? ?Progress Note ? ?Patient Name: Lisa Ayala ?Date of Encounter: 02/08/2022 ? ?[facility] Cardiologist: Will Meredith Leeds, MD  ? ?Subjective  ? ?Converted to sinus rhythm overnight.  SPO2 dropped to 80s overnight, started on 3 L [place].  SPO2 currently 100%.  BP 127/92.  Denies dyspnea or chest pain this morning ? ?Inpatient Medications  ?  ?Scheduled Meds: ? furosemide  40 mg Intravenous BID  ? metoprolol tartrate  50 mg Oral BID  ? ?Continuous Infusions: ? heparin 1,200 Units/hr (02/08/22 0450)  ? ?PRN Meds: ?acetaminophen, ondansetron (ZOFRAN) IV  ? ?Vital Signs  ?  ?Vitals:  ? 02/08/22 0745 02/08/22 0800 02/08/22 0815 02/08/22 0845  ?BP: (!) 126/93 121/87 128/82 (!) 127/92  ?Pulse: 78 71 76 74  ?Resp: 18 (!) 24 (!) 30 (!) 26  ?Temp:      ?TempSrc:      ?SpO2: 90% 98% 99% 100%  ? ? ?Intake/Output Summary (Last 24 hours) at 02/08/2022 0857 ?Last data filed at 02/08/2022 [id number] ?Gross per 24 hour  ?Intake 206.46 ml  ?Output --  ?Net 206.46 ml  ? ? ?  02/07/2022  ?  3:41 PM 07/26/2021  ? 11:05 AM  ?Last 3 Weights  ?Weight (lbs) 150 lb 3.2 oz 139 lb  ?Weight (kg) 68.13 kg 63.05 kg  ?   ? ?Telemetry  ?  ?Normal sinus rhythm with rate 70s to 80s- Personally Reviewed ? ?ECG  ?  ?Normal sinus rhythm, rate 76, right bundle branch block, left posterior fascicular block, QTc 509, 1 mm ST depressions in inferior leads- Personally Reviewed ? ?Physical Exam  ? ?GEN: No acute distress.   ?Neck: + JVD ?Cardiac: RRR, 3/6 holosystolic murmur ?Respiratory: Bibasilar crackles ?GI: Soft, nontender, non-distended  ?MS: No edema; No deformity. ?Neuro:  Nonfocal  ?Psych: Normal affect  ? ?Labs  ?  ?High Sensitivity Troponin:   ?Recent Labs  ?Lab 02/07/22 ?1749 02/07/22 ?2131  ?TROPONINIHS 29* 27*  ?   ?Chemistry ?Recent Labs  ?Lab 02/07/22 ?1749 02/07/22 ?1812 02/07/22 ?2131 02/08/22 ?0351  ?NA 137 138  --  140  ?K 4.5 4.6  --  3.9  ?CL 108 109  --  109  ?CO2 19*  --   --  23  ?GLUCOSE 120* 115*  --  136*  ?BUN 27* 35*  --  27*   ?CREATININE 1.35* 1.20*  --  1.22*  ?CALCIUM 9.1  --   --  8.4*  ?MG  --   --  1.9  --   ?PROT 6.3*  --   --  5.4*  ?ALBUMIN 3.2*  --   --  2.7*  ?AST 122*  --   --  98*  ?ALT 169*  --   --  146*  ?ALKPHOS 125  --   --  113  ?BILITOT 1.9*  --   --  1.4*  ?GFRNONAA 46*  --   --  52*  ?ANIONGAP 10  --   --  8  ?  ?Lipids No results for input(s): CHOL, TRIG, HDL, LABVLDL, LDLCALC, CHOLHDL in the last 168 hours.  ?Hematology ?Recent Labs  ?Lab 02/07/22 ?1749 02/07/22 ?1812 02/08/22 ?0351  ?WBC 10.1  --  12.0*  ?RBC 4.72  --  4.14  ?HGB 13.7 14.6 12.2  ?HCT 42.2 43.0 37.1  ?MCV 89.4  --  89.6  ?MCH 29.0  --  29.5  ?MCHC 32.5  --  32.9  ?RDW 17.7*  --  17.7*  ?PLT 285  --  252  ? ?Thyroid  ?Recent Labs  ?Lab 02/07/22 ?2131  ?TSH 1.569  ?FREET4 1.43*  ?  ?BNPNo results for input(s): BNP, PROBNP in the last 168 hours.  ?DDimer No results for input(s): DDIMER in the last 168 hours.  ? ?Radiology  ?  ?DG Chest Port 1 View ? ?Result Date: 02/07/2022 ?CLINICAL DATA:  Tachycardia. EXAM: PORTABLE CHEST 1 VIEW COMPARISON:  Chest x-ray 07/02/2021. FINDINGS: Heart is enlarged, unchanged. There are small bilateral pleural effusions. There is some strandy opacities in the lung bases. No pneumothorax or acute fracture. IMPRESSION: 1. Cardiomegaly with small pleural effusions and minimal bibasilar atelectasis/edema. Electronically Signed   By: Ronney Asters M.D.   On: 02/07/2022 19:45   ? ?Cardiac Studies  ? ? ? ?Patient Profile  ?   ?56 y.o. female with a history of SVT, severe mitral regurgitation, moderate TR, mild to moderate AI, and anxiety, who presented to the ED secondary to a 11-day history of persistent tachycardia ? ?Assessment & Plan  ?  ?Atrial flutter: Seen in EP clinic 5/16, was in tachyarrhythmia.  Has history of SVT.  Unclear if SVT versus atrial flutter, was sent to ED.  Given adenosine and flutter waves seen.  Started on heparin drip.  She converted to normal sinus rhythm overnight ?-Suspect due to valvular disease as  below.  Planning TEE for further evaluation of valvular disease today ?-Continue metoprolol 50 mg twice daily ?-Continue heparin drip for now.  We will follow-up results of TEE, if suspected valvular AF will need to use warfarin for anticoagulation ? ?Addendum: went back into Aflutter with RVR this morning.  Will start amio gtt to maintain sinus rhythm. ? ?Valvular disease: Echocardiogram 08/25/2021 concerning for rheumatic heart disease with severe MR, mild MS, at least moderate TR, at least mild to moderate AI ?-TEE today for further evaluation ? ?Acute on chronic diastolic heart failure: Appears volume overloaded on exam.  SPO2 to 80s overnight, started on 3 L [place]. ?-Start IV Lasix 40 mg twice daily ?-Strict I's/O's and daily weights ? ?Demand ischemia: Troponin 29 > 27.  Suspect demand ischemia in setting of tachyarrhythmia.  Denies any chest pain ? ?Transaminitis: AST 122, ALT 169 on admission.  Downtrending, will monitor ? ?Substance abuse: UDS positive for cocaine and THC.  She reports she smokes marijuana daily, but adamantly denies any cocaine use.  Cessation recommended ? ? ?For questions or updates, please contact [place] ?Please consult [email or web address] for contact info under  ? ?  ?   ?Signed, ?Donato Heinz, MD  ?02/08/2022, 8:57 AM   ? ?

## 2022-02-08 NOTE — ED Notes (Signed)
Updated patient on NPO status due to procedure this am.   ?

## 2022-02-08 NOTE — ED Notes (Signed)
Patient converted to aflutter 180-190s but is asymptomatic.  Attempted vague maneuvers with no success. MD Bjorn Pippin notified.  Patient will continue with TEE cardioversion and starting patient on amiodarone.  ?

## 2022-02-08 NOTE — ED Notes (Signed)
Transported to Endo for TEE via stretcher, monitor and RN ?

## 2022-02-08 NOTE — ED Notes (Signed)
Patient will be going to TEE procedure and then they will transport patient to 2C ?

## 2022-02-08 NOTE — Consult Note (Signed)
? ?NAME:  Lisa Ayala, MRN:  [id number], DOB:  September 09, 1966, LOS: 1 ?ADMISSION DATE:  02/07/2022, CONSULTATION DATE:  5/17 ?REFERRING MD:  Dr. Gardiner Rhyme, CHIEF COMPLAINT:  hypoxia  ? ?History of Present Illness:  ?56 year old female with PMH as below, which is significant for severe MR, moderate TR, and SVT. She had an episode of SVT in October of 22 and as part of her cardiology follow up she was noted to have murmur. Echo showed extensive valvular disease including wide open MR. She unfortunately missed her scheduled TEE. Presented to EP clinic 5/16 with complaints of palpitations and dyspnea. She was found to be in a rapid atrial rhythm and was sent to ED for further evaluation. In the ED she was worked up for SVT and had several rounds of adenosine given with some evidence of atrial flutter when rate slowed. She was started on heparin for flutter and was planned for TEE/DCCV. Prior to the case beginning on 5/17 she rapidly declined with progressive dyspnea and hypoxia requiring intubation. Briefly suffered asystolic arrest during intubation. PCCM asked to evaluate for vent management.  ? ?Pertinent  Medical History  ? has a past medical history of Aortic insufficiency, PSVT (paroxysmal supraventricular tachycardia) (HCC), Severe mitral regurgitation, and Tricuspid regurgitation. ? ? ?Significant Hospital Events: ?Including procedures, antibiotic start and stop dates in addition to other pertinent events   ?5/16 admit for A flutter ?5/17 decompensated in endoscopy prior to TEE. Intubated. Brief asystolic arrest.  ? ?Interim History / Subjective:  ? ? ?Objective   ?Blood pressure (!) 124/91, pulse 81, temperature 97.7 ?F (36.5 ?C), temperature source Oral, resp. rate (!) 31, SpO2 93 %. ?   ?   ? ?Intake/Output Summary (Last 24 hours) at 02/08/2022 1345 ?Last data filed at 02/08/2022 1109 ?Gross per 24 hour  ?Intake 206.46 ml  ?Output 850 ml  ?Net -643.54 ml  ? ?There were no vitals filed for this  visit. ? ?Examination: ?General: middle aged female in severe acute distress ?HENT: Unable to assess due to distress ?Lungs: bilateral wheeze, in extremis.  ?Cardiovascular: Tachycardic to 180s. Narrow complex. Systolic murmur radiating to the back.  ?Abdomen: Soft, non-tender ?Extremities: No acute deformity or ROM limitation ?Neuro: Alert, oriented ?GU: Foley ? ?Resolved Hospital Problem list   ? ? ?Assessment & Plan:  ? ?Acute hypoxemic respiratory failure secondary to pulmonary edema in the setting of severe MR.  ?- Full vent support ?- requiring high PEEP and FiO2 ?- Follow CXR, ABG ?- VAP protocol ?- Will need definitive therapy for MV piror to SBT ? ?Cardiogenic shock ?- Milrinone infusion 0.375 ?- Vasopressors off for now ?- Diuresis (160mg  lasix given in endo) ?- Appreciate heart failure service ?- CVL/art line placed by anesthesia  ?- Check coox, CVP ?- Repeat labs ? ?Valvular heart disease: Severe MR, mild to mod TR. Trivial AR. ?Acute on chronic HFpEF ?- repeat TTE vs TEE defer to cardiology ?- Diuresis ?- Suspect she will need valve replacement. ?- Afterload reduction.  ? ?Atrial flutter with RVR: CHA2DS2VASc score 1 ?- Amiodarone infusion ?- ? Re-consider DCCV. Sinus now.  ?- Heparin infusion ? ?Substance abuse: THC and cocaine on UDS ?- Cessation counseling when appropriate.  ? ? ?Best Practice (right click and "Reselect all SmartList Selections" daily)  ? ?Diet/type: NPO ?DVT prophylaxis: systemic heparin ?GI prophylaxis: PPI ?Lines: Central line and Arterial Line ?Foley:  Yes, and it is still needed ?Code Status:  full code ?Last date of multidisciplinary goals of  care discussion [ ]  ? ?Labs   ?CBC: ?Recent Labs  ?Lab 02/07/22 ?1749 02/07/22 ?1812 02/08/22 ?0351  ?WBC 10.1  --  12.0*  ?NEUTROABS 6.9  --   --   ?HGB 13.7 14.6 12.2  ?HCT 42.2 43.0 37.1  ?MCV 89.4  --  89.6  ?PLT 285  --  252  ? ? ?Basic Metabolic Panel: ?Recent Labs  ?Lab 02/07/22 ?1749 02/07/22 ?1812 02/07/22 ?2131 02/08/22 ?0351   ?NA 137 138  --  140  ?K 4.5 4.6  --  3.9  ?CL 108 109  --  109  ?CO2 19*  --   --  23  ?GLUCOSE 120* 115*  --  136*  ?BUN 27* 35*  --  27*  ?CREATININE 1.35* 1.20*  --  1.22*  ?CALCIUM 9.1  --   --  8.4*  ?MG  --   --  1.9  --   ? ?GFR: ?Estimated Creatinine Clearance: 49.5 mL/min (A) (by C-G formula based on SCr of 1.22 mg/dL (H)). ?Recent Labs  ?Lab 02/07/22 ?1749 02/08/22 ?0351  ?WBC 10.1 12.0*  ? ? ?Liver Function Tests: ?Recent Labs  ?Lab 02/07/22 ?1749 02/08/22 ?0351  ?AST 122* 98*  ?ALT 169* 146*  ?ALKPHOS 125 113  ?BILITOT 1.9* 1.4*  ?PROT 6.3* 5.4*  ?ALBUMIN 3.2* 2.7*  ? ?No results for input(s): LIPASE, AMYLASE in the last 168 hours. ?No results for input(s): AMMONIA in the last 168 hours. ? ?ABG ?   ?Component Value Date/Time  ? TCO2 21 (L) 02/07/2022 1812  ?  ? ?Coagulation Profile: ?Recent Labs  ?Lab 02/07/22 ?2131  ?INR 1.3*  ? ? ?Cardiac Enzymes: ?No results for input(s): CKTOTAL, CKMB, CKMBINDEX, TROPONINI in the last 168 hours. ? ?HbA1C: ?Hgb A1c MFr Bld  ?Date/Time Value Ref Range Status  ?02/07/2022 09:31 PM 5.3 4.8 - 5.6 % Final  ?  Comment:  ?  (NOTE) ?Pre diabetes:          5.7%-6.4% ? ?Diabetes:              >6.4% ? ?Glycemic control for   <7.0% ?adults with diabetes ?  ? ? ?CBG: ?No results for input(s): GLUCAP in the last 168 hours. ? ?Review of Systems:   ?Patient is encephalopathic and/or intubated. Therefore history has been obtained from chart review.  ? ? ?Past Medical History:  ?She,  has a past medical history of Aortic insufficiency, PSVT (paroxysmal supraventricular tachycardia) (HCC), Severe mitral regurgitation, and Tricuspid regurgitation.  ? ?Surgical History:  ?History reviewed. No pertinent surgical history.  ? ?Social History:  ? reports that she has never smoked. She has never used smokeless tobacco. She reports that she does not currently use alcohol. She reports current drug use. Drug: Marijuana.  ? ?Family History:  ?Her Family history is unknown by patient.   ? ?Allergies ?No Known Allergies  ? ?Home Medications  ?Prior to Admission medications   ?Medication Sig Start Date End Date Taking? Authorizing Provider  ?aspirin EC 81 MG tablet Take 81 mg by mouth daily. Swallow whole.   Yes [provider]  ?metoprolol succinate (TOPROL-XL) 50 MG 24 hr tablet TAKE 1 TABLET BY MOUTH DAILY. TAKE WITH OR IMMEDIATELY FOLLOWING A MEAL. 11/21/21  Yes Camnitz, Ocie Doyne, MD  ?  ? ?Critical care time: 46 minutes ?  ? ? ?Georgann Housekeeper, AGACNP-BC ?[facility] ? ?See Amion for personal pager ?PCCM on call pager [phone] until 7pm. ?Please call Elink 7p-7a.  [phone] ? ?02/08/2022 3:04 PM ? ? ? ? ? ? ? ?

## 2022-02-08 NOTE — Anesthesia Postprocedure Evaluation (Signed)
Anesthesia Post Note ? ?Patient: Lisa Ayala ? ?Procedure(s) Performed: TRANSESOPHAGEAL ECHOCARDIOGRAM (TEE) ?INVASIVE LAB ABORTED CASE ? ?  ? ?Patient location during evaluation: SICU ?Anesthesia Type: General ?Level of consciousness: sedated ?Pain management: pain level controlled ?Vital Signs Assessment: post-procedure vital signs reviewed and stable ?Respiratory status: patient remains intubated per anesthesia plan ?Cardiovascular status: stable ?Postop Assessment: no apparent nausea or vomiting ?Anesthetic complications: no ?Comments: Lisa Ayala progressively more diaphoretic and clinically unstable prior to start of procedure. Based on clinical findings and increased work of breathing. Drs. Cordelia Poche and CCM agreed to postpone case for optimization. Lasix administered, arterial line placed. ABG significant for hypoxemia 2/2 respiratory failure likely due to worsening MR and volume overload. Lisa Ayala consented for intubation. Intubation performed by CRNA, immediately after intubation frothy fluid noted from ETT. Successful intubation confirmed by MDA and ETT suction performed. Lisa Ayala placed on ventilator. Despite administration of vasopressors, Lisa Ayala deteriorated to PEA arrest. Compressions initiated, Epi administered. ROSC achieved and supportive care provided. CVC placed, vasopressor gtt's continued. Pt transported to [facility] for further evaluation and care.   ? ? ?No notable events documented. ? ?Last Vitals:  ?Vitals:  ? 02/08/22 1205 02/08/22 1236  ?BP: (!) 119/95 (!) 124/91  ?Pulse: 80 81  ?Resp: 16 (!) 31  ?Temp: (!) 36.4 ?C 36.5 ?C  ?SpO2: 100% 93%  ?  ?Last Pain:  ?Vitals:  ? 02/08/22 1236  ?TempSrc: Oral  ?PainSc: 0-No pain  ? ? ?  ?  ?  ?  ?  ?  ? ?Lisa Ayala ? ? ? ? ?

## 2022-02-08 NOTE — Progress Notes (Signed)
Patient sob, diaphoretic, case aborted ?

## 2022-02-08 NOTE — Anesthesia Procedure Notes (Signed)
Procedure Name: Intubation ?Date/Time: 02/08/2022 1:52 PM ?Performed by: Amadeo Garnet, CRNA ?Pre-anesthesia Checklist: Patient identified, Emergency Drugs available, Suction available and Patient being monitored ?Patient Re-evaluated:Patient Re-evaluated prior to induction ?Oxygen Delivery Method: Circle system utilized ?Preoxygenation: Pre-oxygenation with 100% oxygen ?Induction Type: IV induction and Rapid sequence ?Laryngoscope Size: Mac and 4 ?Grade View: Grade I ?Tube type: Oral ?Tube size: 8.0 mm ?Number of attempts: 1 ?Airway Equipment and Method: Stylet and Oral airway ?Placement Confirmation: ETT inserted through vocal cords under direct vision, positive ETCO2 and breath sounds checked- equal and bilateral ?Secured at: 23 cm ?Tube secured with: Tape ?Dental Injury: Teeth and Oropharynx as per pre-operative assessment  ? ? ? ? ?

## 2022-02-09 ENCOUNTER — Encounter (HOSPITAL_COMMUNITY): Payer: Self-pay | Admitting: Anesthesiology

## 2022-02-09 ENCOUNTER — Inpatient Hospital Stay (HOSPITAL_COMMUNITY): Payer: Medicaid Other

## 2022-02-09 ENCOUNTER — Other Ambulatory Visit (HOSPITAL_COMMUNITY): Payer: Self-pay

## 2022-02-09 DIAGNOSIS — I051 Rheumatic mitral insufficiency: Secondary | ICD-10-CM

## 2022-02-09 DIAGNOSIS — Z8679 Personal history of other diseases of the circulatory system: Secondary | ICD-10-CM

## 2022-02-09 DIAGNOSIS — R570 Cardiogenic shock: Secondary | ICD-10-CM

## 2022-02-09 DIAGNOSIS — I340 Nonrheumatic mitral (valve) insufficiency: Secondary | ICD-10-CM

## 2022-02-09 LAB — POCT I-STAT 7, (LYTES, BLD GAS, ICA,H+H)
Acid-Base Excess: 3 mmol/L — ABNORMAL HIGH (ref 0.0–2.0)
Bicarbonate: 26.3 mmol/L (ref 20.0–28.0)
Calcium, Ion: 1.1 mmol/L — ABNORMAL LOW (ref 1.15–1.40)
HCT: 37 % (ref 36.0–46.0)
Hemoglobin: 12.6 g/dL (ref 12.0–15.0)
O2 Saturation: 99 %
Patient temperature: 97.7
Potassium: 3.7 mmol/L (ref 3.5–5.1)
Sodium: 144 mmol/L (ref 135–145)
TCO2: 27 mmol/L (ref 22–32)
pCO2 arterial: 32.7 mmHg (ref 32–48)
pH, Arterial: 7.511 — ABNORMAL HIGH (ref 7.35–7.45)
pO2, Arterial: 123 mmHg — ABNORMAL HIGH (ref 83–108)

## 2022-02-09 LAB — GLUCOSE, CAPILLARY
Glucose-Capillary: 112 mg/dL — ABNORMAL HIGH (ref 70–99)
Glucose-Capillary: 145 mg/dL — ABNORMAL HIGH (ref 70–99)
Glucose-Capillary: 127 mg/dL — ABNORMAL HIGH (ref 70–99)
Glucose-Capillary: 118 mg/dL — ABNORMAL HIGH (ref 70–99)

## 2022-02-09 LAB — CBC
HCT: 38.5 % (ref 36.0–46.0)
Hemoglobin: 12.3 g/dL (ref 12.0–15.0)
MCH: 28.7 pg (ref 26.0–34.0)
MCHC: 31.9 g/dL (ref 30.0–36.0)
MCV: 89.7 fL (ref 80.0–100.0)
Platelets: 263 10*3/uL (ref 150–400)
RBC: 4.29 MIL/uL (ref 3.87–5.11)
RDW: 17.7 % — ABNORMAL HIGH (ref 11.5–15.5)
WBC: 15.8 10*3/uL — ABNORMAL HIGH (ref 4.0–10.5)
nRBC: 0.1 % (ref 0.0–0.2)

## 2022-02-09 LAB — COMPREHENSIVE METABOLIC PANEL
ALT: 134 U/L — ABNORMAL HIGH (ref 0–44)
AST: 87 U/L — ABNORMAL HIGH (ref 15–41)
Albumin: 2.5 g/dL — ABNORMAL LOW (ref 3.5–5.0)
Alkaline Phosphatase: 108 U/L (ref 38–126)
Anion gap: 10 (ref 5–15)
BUN: 30 mg/dL — ABNORMAL HIGH (ref 6–20)
CO2: 27 mmol/L (ref 22–32)
Calcium: 8.1 mg/dL — ABNORMAL LOW (ref 8.9–10.3)
Chloride: 104 mmol/L (ref 98–111)
Creatinine, Ser: 1.77 mg/dL — ABNORMAL HIGH (ref 0.44–1.00)
GFR, Estimated: 33 mL/min — ABNORMAL LOW (ref >60)
Glucose, Bld: 150 mg/dL — ABNORMAL HIGH (ref 70–99)
Potassium: 3.7 mmol/L (ref 3.5–5.1)
Sodium: 141 mmol/L (ref 135–145)
Total Bilirubin: 1.8 mg/dL — ABNORMAL HIGH (ref 0.3–1.2)
Total Protein: 5.3 g/dL — ABNORMAL LOW (ref 6.5–8.1)

## 2022-02-09 LAB — COOXEMETRY PANEL
Carboxyhemoglobin: 1.4 % (ref 0.5–1.5)
Methemoglobin: <0.7 % (ref 0.0–1.5)
O2 Saturation: 57.2 %
Total hemoglobin: 13.5 g/dL (ref 12.0–16.0)

## 2022-02-09 LAB — MAGNESIUM: Magnesium: 2.6 mg/dL — ABNORMAL HIGH (ref 1.7–2.4)

## 2022-02-09 LAB — HEPARIN LEVEL (UNFRACTIONATED)
Heparin Unfractionated: 0.8 IU/mL — ABNORMAL HIGH (ref 0.30–0.70)
Heparin Unfractionated: 0.6 IU/mL (ref 0.30–0.70)
Heparin Unfractionated: 0.52 IU/mL (ref 0.30–0.70)

## 2022-02-09 MED ORDER — PROSOURCE TF PO LIQD
45.0000 mL | Freq: Two times a day (BID) | ORAL | Status: DC
  Administered 2022-02-09 – 2022-02-10 (×2): 45 mL
  Filled 2022-02-09 (×2): qty 45

## 2022-02-09 MED ORDER — SODIUM CHLORIDE 0.9 % IV SOLN: INTRAVENOUS | Status: DC

## 2022-02-09 MED ORDER — POTASSIUM CHLORIDE 20 MEQ PO PACK
40.0000 meq | PACK | Freq: Once | ORAL | Status: AC
  Administered 2022-02-09: 40 meq
  Filled 2022-02-09: qty 2

## 2022-02-09 MED ORDER — PHENYLEPHRINE 80 MCG/ML (10ML) SYRINGE FOR IV PUSH (FOR BLOOD PRESSURE SUPPORT)
PREFILLED_SYRINGE | INTRAVENOUS | Status: AC
  Filled 2022-02-09: qty 10

## 2022-02-09 MED ORDER — VITAL HIGH PROTEIN PO LIQD
1000.0000 mL | ORAL | Status: DC
  Administered 2022-02-09 – 2022-02-10 (×2): 1000 mL

## 2022-02-09 MED ORDER — CALCIUM GLUCONATE-NACL 1-0.675 GM/50ML-% IV SOLN
1.0000 g | Freq: Once | INTRAVENOUS | Status: AC
  Administered 2022-02-09: 1000 mg via INTRAVENOUS
  Filled 2022-02-09: qty 50

## 2022-02-09 NOTE — Progress Notes (Addendum)
Advanced Heart Failure Rounding Note  PCP-Cardiologist: Will Meredith Leeds, MD   Subjective:   5/17 Flash pulmonary edema. PEA arrest. Intubated. ACLS 1-2 min. ROSC. Placed on milrinone, norepi, and lasix drip.   Remains on milrinone 0.25 mcg + norepi 14 mcg. CO-OX 57%  On amio + heparin drip. Maintaining Sr  Diuresed with IV lasix. CVP went down 5-6. Lasix stopped. Creatinine trending up 0.9>1.77.   Intubated  FiO2 50%    Objective:   Weight Range: 67.4 kg Body mass index is 23.27 kg/m.   Vital Signs:   Temp:  [96.7 F (35.9 C)-97.7 F (36.5 C)] 96.7 F (35.9 C) (05/17 2009) Pulse Rate:  [52-87] 62 (05/18 0645) Resp:  [0-31] 24 (05/18 0645) BP: (88-151)/(30-105) 107/81 (05/18 0645) SpO2:  [90 %-100 %] 100 % (05/18 0645) FiO2 (%):  [50 %-100 %] 50 % (05/18 0338) Weight:  [67.4 kg] 67.4 kg (05/18 0420)    Weight change: Filed Weights   02/09/22 0420  Weight: 67.4 kg    Intake/Output:   Intake/Output Summary (Last 24 hours) at 02/09/2022 0706 Last data filed at 02/09/2022 0600 Gross per 24 hour  Intake 2842.58 ml  Output 2725 ml  Net 117.58 ml      Physical Exam   CVP 5 personally checked.  General:  Intubated /sedated HEENT: ETT Neck: Supple. JVP  5-6 . Carotids 2+ bilat; no bruits. No lymphadenopathy or thyromegaly appreciated. RIJ  Cor: PMI nondisplaced. Regular rate & rhythm. No rubs, gallops. 2/6 MR . Lungs: Coarse throughout Abdomen: Soft, nontender, nondistended. No hepatosplenomegaly. No bruits or masses. Good bowel sounds. Extremities: No cyanosis, clubbing, rash, edema. L radial A line. R and LLE SCDs.  Neuro: Intubated  Telemetry   SR 50-70s   EKG   N//A   Labs    CBC Recent Labs    02/07/22 1749 02/07/22 1812 02/08/22 1637 02/08/22 1833 02/08/22 2235 02/09/22 0426  WBC 10.1   < > 20.0*  --   --  15.8*  NEUTROABS 6.9  --   --   --   --   --   HGB 13.7   < > 13.0   < > 12.6 12.3  HCT 42.2   < > 40.4   < > 37.0 38.5  MCV  89.4   < > 89.2  --   --  89.7  PLT 285   < > 258  --   --  263   < > = values in this interval not displayed.   Basic Metabolic Panel Recent Labs    02/08/22 1637 02/08/22 1833 02/08/22 2235 02/09/22 0426  NA 139   < > 143 141  K 3.5   < > 3.7 3.7  CL 104  --   --  104  CO2 25  --   --  27  GLUCOSE 220*  --   --  150*  BUN 28*  --   --  30*  CREATININE 1.70*  --   --  1.77*  CALCIUM 8.2*  --   --  8.1*  MG 1.6*  --   --  2.6*  PHOS 3.7  --   --   --    < > = values in this interval not displayed.   Liver Function Tests Recent Labs    02/08/22 1637 02/09/22 0426  AST 106* 87*  ALT 147* 134*  ALKPHOS 121 108  BILITOT 1.7* 1.8*  PROT 5.6* 5.3*  ALBUMIN 2.7*  2.5*   No results for input(s): LIPASE, AMYLASE in the last 72 hours. Cardiac Enzymes No results for input(s): CKTOTAL, CKMB, CKMBINDEX, TROPONINI in the last 72 hours.  BNP: BNP (last 3 results) Recent Labs    07/02/21 1335 02/08/22 0351  BNP 1,157.2* 709.9*    ProBNP (last 3 results) No results for input(s): PROBNP in the last 8760 hours.   D-Dimer No results for input(s): DDIMER in the last 72 hours. Hemoglobin A1C Recent Labs    02/07/22 2131  HGBA1C 5.3   Fasting Lipid Panel No results for input(s): CHOL, HDL, LDLCALC, TRIG, CHOLHDL, LDLDIRECT in the last 72 hours. Thyroid Function Tests Recent Labs    02/07/22 2131  TSH 1.569    Other results:   Imaging    DG Chest Port 1 View  Result Date: 02/08/2022 CLINICAL DATA:  Nasogastric tube placement and central line placement EXAM: PORTABLE CHEST 1 VIEW COMPARISON:  Earlier today at 2:41 p.m. FINDINGS: 3:57 p.m. Endotracheal tube terminates 2.2 cm above carina. Nasogastric tube extends beyond the inferior aspect of the film. Right internal jugular line tip is favored to terminate at the high right atrium or cavoatrial junction. Numerous leads and wires project over the chest. Patient is rotated left. Mild cardiomegaly. Probable small  bilateral pleural effusions. No pneumothorax. Interstitial and airspace disease is symmetric, lower lung predominant, and progressive. IMPRESSION: Mild position degradation. Right internal jugular line tip is favored to terminate at the high right atrium or cavoatrial junction. No pneumothorax. Nasogastric tube extends beyond the inferior aspect of the film. Progressive interstitial and airspace disease, likely pulmonary edema. Electronically Signed   By: Abigail Miyamoto M.D.   On: 02/08/2022 16:17   DG CHEST PORT 1 VIEW  Result Date: 02/08/2022 CLINICAL DATA:  Shortness of breath EXAM: PORTABLE CHEST 1 VIEW COMPARISON:  02/07/2022 FINDINGS: Endotracheal tube is 3 cm above the carina. Right central line tip at the cavoatrial junction. No pneumothorax. Cardiomegaly. Diffuse bilateral interstitial and airspace opacities. This could reflect edema or infection. No visible effusions or pneumothorax. No acute bony abnormality. IMPRESSION: Cardiomegaly. Diffuse bilateral interstitial and alveolar opacities could reflect edema or infection. Electronically Signed   By: Rolm Baptise M.D.   On: 02/08/2022 15:12   DG Abd Portable 1V  Result Date: 02/08/2022 CLINICAL DATA:  NG tube placement EXAM: PORTABLE ABDOMEN - 1 VIEW COMPARISON:  06/18/2006 FINDINGS: Tip of enteric tube is seen in the antrum of the stomach. Bowel gas pattern is nonspecific. Pelvis is not included. Haziness in the lower lung fields suggest bilateral pleural effusions. Increased markings in the lower lung fields are noted, more so on the right side suggesting asymmetric pulmonary edema or multifocal pneumonia. IMPRESSION: Tip of enteric tube is seen in the antrum of the stomach. Electronically Signed   By: Elmer Picker M.D.   On: 02/08/2022 16:15   ECHOCARDIOGRAM COMPLETE  Result Date: 02/08/2022    ECHOCARDIOGRAM REPORT   Patient Name:   Lisa Ayala [facility] Date of Exam: 02/08/2022 Medical Rec #:  [id number]       Height:       67.0 in Accession  #:    [id number]      Weight:       150.2 lb Date of Birth:  July 02, 1966        BSA:          1.791 m Patient Age:    56 years        BP:  124/91 mmHg Patient Gender: F               HR:           69 bpm. Exam Location:  Inpatient Procedure: 2D Echo, Cardiac Doppler and Color Doppler                           STAT ECHO Reported to: Dr Buford Dresser on 02/08/2022 5:30:00 PM. Indications:    Mitral valve insufficiency  History:        Patient has prior history of Echocardiogram examinations, most                 recent 08/25/2021. CHF, Mitral Valve Disease; Arrythmias:Atrial                 Flutter.  Sonographer:    Clayton Lefort RDCS (AE) Referring Phys: [id number] [place]  Sonographer Comments: Echo performed with patient supine and on artificial respirator. IMPRESSIONS  1. Left ventricular ejection fraction, by estimation, is 65 to 70%. The left ventricle has normal function. The left ventricle has no regional wall motion abnormalities. There is moderate concentric left ventricular hypertrophy. Left ventricular diastolic parameters are consistent with Grade III diastolic dysfunction (restrictive).  2. Right ventricular systolic function is mildly reduced. The right ventricular size is normal. There is mildly elevated pulmonary artery systolic pressure.  3. Left atrial size was severely dilated.  4. Right atrial size was mildly dilated.  5. A small pericardial effusion is present.  6. 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 Trivial mitral stenosis.  7. Tricuspid valve regurgitation is moderate.  8. The aortic valve is tricuspid. There is mild calcification of the aortic valve. Aortic valve regurgitation is mild.  9. The inferior vena cava is dilated in size with <50% respiratory variability, suggesting right atrial pressure of 15 mmHg. Comparison(s): Prior images reviewed side by side. Conclusion(s)/Recommendation(s): No significant changes from prior--discussed with  Dr. Gardiner Rhyme. Severe MR. Valves better seen on prior imaging. FINDINGS  Left Ventricle: Left ventricular ejection fraction, by estimation, is 65 to 70%. The left ventricle has normal function. The left ventricle has no regional wall motion abnormalities. The left ventricular internal cavity size was normal in size. There is  moderate concentric left ventricular hypertrophy. Left ventricular diastolic parameters are consistent with Grade III diastolic dysfunction (restrictive). Right Ventricle: The right ventricular size is normal. Right vetricular wall thickness was not well visualized. Right ventricular systolic function is mildly reduced. There is mildly elevated pulmonary artery systolic pressure. The tricuspid regurgitant velocity is 2.69 m/s, and with an assumed right atrial pressure of 15 mmHg, the estimated right ventricular systolic pressure is 97.5 mmHg. Left Atrium: Left atrial size was severely dilated. Right Atrium: Right atrial size was mildly dilated. Pericardium: A small pericardial effusion is present. Mitral Valve: The mitral valve is rheumatic. There is severe thickening of the mitral valve leaflet(s). Moderately decreased mobility of the mitral valve leaflets. Severe mitral valve regurgitation, with eccentric posteriorly directed jet. Trivial mitral  valve stenosis. MV peak gradient, 4.5 mmHg. The mean mitral valve gradient is 1.0 mmHg. Tricuspid Valve: The tricuspid valve is not well visualized. Tricuspid valve regurgitation is moderate. Aortic Valve: The aortic valve is tricuspid. There is mild calcification of the aortic valve. Aortic valve regurgitation is mild. Aortic valve mean gradient measures 4.0 mmHg. Aortic valve peak gradient measures 7.8 mmHg. Aortic valve area, by VTI measures 2.10 cm. Pulmonic  Valve: The pulmonic valve was not well visualized. Pulmonic valve regurgitation is trivial. No evidence of pulmonic stenosis. Aorta: The aortic arch was not well visualized and the aortic  root and ascending aorta are structurally normal, with no evidence of dilitation. Venous: The inferior vena cava is dilated in size with less than 50% respiratory variability, suggesting right atrial pressure of 15 mmHg. IAS/Shunts: The atrial septum is grossly normal.  LEFT VENTRICLE PLAX 2D LVIDd:         3.60 cm     Diastology LVIDs:         2.20 cm     LV e' medial:    5.25 cm/s LV PW:         1.60 cm     LV E/e' medial:  20.0 LV IVS:        1.70 cm     LV e' lateral:   9.31 cm/s LVOT diam:     1.90 cm     LV E/e' lateral: 11.3 LV SV:         48 LV SV Index:   27 LVOT Area:     2.84 cm  LV Volumes (MOD) LV vol d, MOD A2C: 95.1 ml LV vol d, MOD A4C: 90.8 ml LV vol s, MOD A2C: 41.9 ml LV vol s, MOD A4C: 44.1 ml LV SV MOD A2C:     53.2 ml LV SV MOD A4C:     90.8 ml LV SV MOD BP:      49.0 ml RIGHT VENTRICLE RV Basal diam:  3.30 cm RV S prime:     6.87 cm/s TAPSE (M-mode): 1.2 cm LEFT ATRIUM              Index        RIGHT ATRIUM           Index LA diam:        2.80 cm  1.56 cm/m   RA Area:     19.00 cm LA Vol (A2C):   171.0 ml 95.50 ml/m  RA Volume:   49.00 ml  27.37 ml/m LA Vol (A4C):   119.0 ml 66.46 ml/m LA Biplane Vol: 153.0 ml 85.45 ml/m  AORTIC VALVE AV Area (Vmax):    2.15 cm AV Area (Vmean):   2.05 cm AV Area (VTI):     2.10 cm AV Vmax:           140.00 cm/s AV Vmean:          89.000 cm/s AV VTI:            0.228 m AV Peak Grad:      7.8 mmHg AV Mean Grad:      4.0 mmHg LVOT Vmax:         106.00 cm/s LVOT Vmean:        64.500 cm/s LVOT VTI:          0.169 m LVOT/AV VTI ratio: 0.74  AORTA Ao Root diam: 2.90 cm Ao Asc diam:  3.40 cm MITRAL VALVE                  TRICUSPID VALVE MV Area (PHT): 1.55 cm       TR Peak grad:   28.9 mmHg MV Area VTI:   1.36 cm       TR Vmax:        269.00 cm/s MV Peak grad:  4.5 mmHg MV Mean grad:  1.0 mmHg  SHUNTS MV Vmax:       1.06 m/s       Systemic VTI:  0.17 m MV Vmean:      50.6 cm/s      Systemic Diam: 1.90 cm MV Decel Time: 488 msec MR Peak grad:    100.8  mmHg MR Mean grad:    65.0 mmHg MR Vmax:         502.00 cm/s MR Vmean:        378.0 cm/s MR PISA:         6.28 cm MR PISA Eff ROA: 48 mm MR PISA Radius:  1.00 cm MV E velocity: 105.00 cm/s MV A velocity: 28.30 cm/s MV E/A ratio:  3.71 Buford Dresser MD Electronically signed by Buford Dresser MD Signature Date/Time: 02/08/2022/5:55:39 PM    Final      Medications:     Scheduled Medications:  chlorhexidine gluconate (MEDLINE KIT)  15 mL Mouth Rinse BID   Chlorhexidine Gluconate Cloth  6 each Topical Daily   docusate  100 mg Per Tube BID   fentaNYL (SUBLIMAZE) injection  50 mcg Intravenous Once   mouth rinse  15 mL Mouth Rinse 10 times per day   metoprolol tartrate  50 mg Oral BID   pantoprazole (PROTONIX) IV  40 mg Intravenous Daily   pantoprazole sodium  40 mg Per Tube Daily   polyethylene glycol  17 g Per Tube Daily    Infusions:  amiodarone 30 mg/hr (02/09/22 0600)   fentaNYL infusion INTRAVENOUS 100 mcg/hr (02/09/22 0600)   furosemide (LASIX) 200 mg in dextrose 5% 100 mL (91m/mL) infusion Stopped (02/08/22 2323)   heparin 1,000 Units/hr (02/09/22 0650)   midazolam 4 mg/hr (02/09/22 0600)   milrinone 0.25 mcg/kg/min (02/09/22 0600)   norepinephrine (LEVOPHED) Adult infusion 18 mcg/min (02/09/22 0600)    PRN Medications: acetaminophen, fentaNYL, midazolam, ondansetron (ZOFRAN) IV    Patient Profile     56y/o woman with severe MR diagnosed about 6 months ago. Lost to f/u. Admitted with AFL with RVR.   Assessment/Plan  1. Shock, cardiogenic -Lactic acid 2.6>1.9  -CO-OX  57% on Norepi 14 mcg + milrinone 0.25 mcg.  - Continue to wean norepi as able.  - Stop bb  2. PEA Arrest -ACLS x1 min with ROSC -Intubated. Placed on pressors.   3.  Acute Hypoxic Respiratory Failure  -Urgent intubation -FiO2 down to 50%.  -CVP 5 today. Holding diuretics.    4.  Severe MR  - ECHO EF 65-70% Grade III DD, severe MR and rheumatic - TEE aborted due PEA arrest.  -- MVR  once recovered   5 . PAF -A flutter on admit. Spontaneously converted to SR.  - Maintaining SR - On amio drip + heparin drip.    6.  AKI  -Creatinine 0.96--->1.8 today -Avoid hypotension.  -CVP 5. Holding diuretics.     Length of Stay: 2  ADarrick Grinder NP  02/09/2022, 7:06 AM  Advanced Heart Failure Team Pager [phone](M-F; 7a - 5p)  Please contact [facility] for night-coverage after hours (5p -7a ) and weekends on [email or web address]  Agree with above.  Remains intubated sedated. Down to 50% FiO2  On NE at 24 and milrinone 0.25. CVP 6. Off lasix. Co-ox 57%.    General:  Intubated. sedated HEENT: normal Neck: supple. RIJ TLC. Carotids 2+ bilat; no bruits. No lymphadenopathy or thryomegaly appreciated. Cor: PMI nondisplaced. Regular rate & rhythm. 2/6 MR + AI Lungs: clear Abdomen: soft, nontender, nondistended. No  hepatosplenomegaly. No bruits or masses. Good bowel sounds. Extremities: no cyanosis, clubbing, rash, edema Neuro: intubated sedated  Remains tenuous but improved. Wean NE as tolerated. Will plan TEE today while intubated to evaluate MV. Hopefully can extubate tomorrow. Will need eventual R/L cath and MVR. D/w CCM. If difficult to wean pressors or vent will need swan.   CRITICAL CARE Performed by: Glori Bickers  Total critical care time: 45 minutes  Critical care time was exclusive of separately billable procedures and treating other patients.  Critical care was necessary to treat or prevent imminent or life-threatening deterioration.  Critical care was time spent personally by me (independent of midlevel providers or residents) on the following activities: development of treatment plan with patient and/or surrogate as well as nursing, discussions with consultants, evaluation of patient's response to treatment, examination of patient, obtaining history from patient or surrogate, ordering and performing treatments and interventions, ordering and review of laboratory  studies, ordering and review of radiographic studies, pulse oximetry and re-evaluation of patient's condition.  Glori Bickers, MD  9:12 AM

## 2022-02-09 NOTE — Progress Notes (Signed)
ANTICOAGULATION CONSULT NOTE Pharmacy Consult for Heparin Indication: atrial fibrillation Brief A/P: Heparin level supratherapeutic Decrease Heparin rate  No Known Allergies  Patient Measurements: Weight: 67.4 kg (148 lb 9.4 oz) Heparin Dosing Weight: 68.1 kg  Vital Signs: Temp: 96.7 F (35.9 C) (05/17 2009) Temp Source: Axillary (05/17 2009) BP: 103/74 (05/18 0600) Pulse Rate: 59 (05/18 0600)  Labs: Recent Labs    02/07/22 2131 02/08/22 0351 02/08/22 1150 02/08/22 1344 02/08/22 1637 02/08/22 1831 02/08/22 1833 02/08/22 2015 02/08/22 2215 02/08/22 2235 02/09/22 0426  HGB  --  12.2  --    < > 13.0  --  13.6  --   --  12.6 12.3  HCT  --  37.1  --    < > 40.4  --  40.0  --   --  37.0 38.5  PLT  --  252  --   --  258  --   --   --   --   --  263  LABPROT 15.7*  --   --   --   --   --   --   --   --   --   --   INR 1.3*  --   --   --   --   --   --   --   --   --   --   HEPARINUNFRC  --  0.23* 0.70  --   --   --   --   --   --   --  0.80*  CREATININE  --  1.22*  --   --  1.70*  --   --   --   --   --  1.77*  TROPONINIHS 27*  --   --   --   --  104*  --  155* 239*  --   --    < > = values in this interval not displayed.     Estimated Creatinine Clearance: 34.1 mL/min (A) (by C-G formula based on SCr of 1.77 mg/dL (H)).  Assessment: 56 y.o. female with Afib for heparin  Goal of Therapy:  Heparin level 0.3-0.7 units/ml Monitor platelets by anticoagulation protocol: Yes   Plan:  Decrease Heparin 1000 units/hr  Geannie Risen, PharmD, BCPS  02/09/2022 6:10 AM

## 2022-02-09 NOTE — Plan of Care (Signed)

## 2022-02-09 NOTE — Progress Notes (Signed)
ANTICOAGULATION CONSULT NOTE - Follow Up Consult  Pharmacy Consult for heparin gtt Indication: atrial fibrillation  No Known Allergies  Patient Measurements: Weight: 67.4 kg (148 lb 9.4 oz) Heparin Dosing Weight: 68.1 kg  Vital Signs: Temp: 97.9 F (36.6 C) (05/18 0748) Temp Source: Oral (05/18 0748) BP: 122/80 (05/18 1400) Pulse Rate: 60 (05/18 1400)  Labs: Recent Labs    02/07/22 1749 02/07/22 2131 02/08/22 0351 02/08/22 1150 02/08/22 1344 02/08/22 1637 02/08/22 1831 02/08/22 1833 02/08/22 2015 02/08/22 2215 02/08/22 2235 02/09/22 0426 02/09/22 0956 02/09/22 1331  HGB  --   --  12.2  --    < > 13.0  --    < >  --   --  12.6 12.3 12.6  --   HCT  --   --  37.1  --    < > 40.4  --    < >  --   --  37.0 38.5 37.0  --   PLT  --   --  252  --   --  258  --   --   --   --   --  263  --   --   LABPROT  --  15.7*  --   --   --   --   --   --   --   --   --   --   --   --   INR  --  1.3*  --   --   --   --   --   --   --   --   --   --   --   --   HEPARINUNFRC   < >  --  0.23* 0.70  --   --   --   --   --   --   --  0.80*  --  0.60  CREATININE  --   --  1.22*  --   --  1.70*  --   --   --   --   --  1.77*  --   --   TROPONINIHS  --  27*  --   --   --   --  104*  --  155* 239*  --   --   --   --    < > = values in this interval not displayed.    Estimated Creatinine Clearance: 34.1 mL/min (A) (by C-G formula based on SCr of 1.77 mg/dL (H)).  Assessment: 45 yof with a history of SVT, mitral regurg TR, AI, anxiety. Patient is presenting with tachycardia. Heparin per pharmacy consult placed for atrial fibrillation. Patient is not on anticoagulation prior to arrival.   Most recent heparin level is therapeutic at 0.6. No issues noted per chart review.  Goal of Therapy:  Heparin level 0.3-0.7 units/ml Monitor platelets by anticoagulation protocol: Yes   Plan:  Continue heparin 1000 units/hr  6h confirmatory heparin level Daily CBC Monitor for s/sx of bleeding   Thank  you for including pharmacy in the care of this patient.  Lissa Merlin, PharmD PGY1 Acute Care Pharmacy Resident  Phone: [phone] 02/09/2022  2:13 PM  Please check [email or web address] for unit-specific pharmacy phone numbers.

## 2022-02-09 NOTE — CV Procedure (Signed)
    TRANSESOPHAGEAL ECHOCARDIOGRAM   NAME:  SINDEL LEY   MRN: [id number] DOB:  06-20-1966   ADMIT DATE: 02/07/2022  INDICATIONS:  Mitral regurgitation  PROCEDURE:   Patient critically ill on vent. Consent on chart  After a procedural time-out, the patient was further sedated.   The transesophageal probe was inserted in the esophagus and stomach without difficulty and multiple views were obtained.    COMPLICATIONS:    There were no immediate complications.  FINDINGS:  LEFT VENTRICLE: EF = 60-65%. No regional wall motion abnormalities.  RIGHT VENTRICLE: Mild HK  LEFT ATRIUM: Massively dilated  LEFT ATRIAL APPENDAGE: No thrombus.   RIGHT ATRIUM: Normal  AORTIC VALVE:  Trileaflet.  Mild AI  MITRAL VALVE:    Rheumatic. Thickened. Posterior leaflet restricted. Severe ME  TRICUSPID VALVE: Normal. Mild TR  PULMONIC VALVE: Grossly normal.  INTERATRIAL SEPTUM: No PFO or ASD.  PERICARDIUM: Small anterior effusion.   DESCENDING AORTA: Mild plaque     Benay Spice 12:35 PM

## 2022-02-09 NOTE — Progress Notes (Addendum)
  Transition of Care [facility]) Screening Note   Patient Details  Name: Lisa Ayala Date of Birth: [birth year] Transition of Care [facility]) CM/SW Contact:    Glenice Ciccone, LCSW Phone Number: 02/09/2022, 10:06 AM     [facility] CSW reached out to [employer] to screen for Medicaid. Awaiting follow up. We will continue to monitor patient advancement through interdisciplinary progression rounds.  CSW will continue to follow throughout discharge.

## 2022-02-09 NOTE — Progress Notes (Addendum)
NAME:  Lisa Ayala, MRN:  [id number], DOB:  04-01-1966, LOS: 2 ADMISSION DATE:  02/07/2022, CONSULTATION DATE:  5/17 REFERRING MD:  Dr. Gardiner Rhyme, CHIEF COMPLAINT:  hypoxia   History of Present Illness:  56 year old female with PMH as below, which is significant for severe MR, moderate TR, and SVT. She had an episode of SVT in October of 22 and as part of her cardiology follow up she was noted to have murmur. Echo showed extensive valvular disease including wide open MR. She unfortunately missed her scheduled TEE. Presented to EP clinic 5/16 with complaints of palpitations and dyspnea. She was found to be in a rapid atrial rhythm and was sent to ED for further evaluation. In the ED she was worked up for SVT and had several rounds of adenosine given with some evidence of atrial flutter when rate slowed. She was started on heparin for flutter and was planned for TEE/DCCV. Prior to the case beginning on 5/17 she rapidly declined with progressive dyspnea, flash pulmonary edema, and hypoxia requiring intubation. Briefly suffered asystolic arrest during intubation. PCCM asked to evaluate for vent management.   Pertinent  Medical History   has a past medical history of Aortic insufficiency, PSVT (paroxysmal supraventricular tachycardia) (HCC), Severe mitral regurgitation, and Tricuspid regurgitation.  Significant Hospital Events: Including procedures, antibiotic start and stop dates in addition to other pertinent events   5/16 admit for A flutter 5/17 decompensated in endoscopy prior to TEE. Intubated. Brief asystolic arrest.   Interim History / Subjective:  Remains on fiO2 50%/ 12 peep Coox 57 CVP 5-6 and sCr 1.22> 1.77>  lasix stopped today Remains on milrinone 0.25 and NE 14 Plans for TEE today per HF  Woke up this morning on fentanyl/ versed gtt agitated and trying to get OOB.  Was able to follow some simple intermittent commands, re-sedated  Objective   Blood pressure 100/64, pulse 67,  temperature 97.9 F (36.6 C), temperature source Oral, resp. rate (!) 24, weight 67.4 kg, SpO2 100 %. CVP:  [6 mmHg] 6 mmHg  Vent Mode: PRVC FiO2 (%):  [50 %-100 %] 50 % Set Rate:  [24 bmp] 24 bmp Vt Set:  [490 mL] 490 mL PEEP:  [12 cmH20] 12 cmH20 Plateau Pressure:  [21 cmH20-23 cmH20] 22 cmH20   Intake/Output Summary (Last 24 hours) at 02/09/2022 0933 Last data filed at 02/09/2022 0600 Gross per 24 hour  Intake 2636.12 ml  Output 2725 ml  Net -88.88 ml   Filed Weights   02/09/22 0420  Weight: 67.4 kg    Examination: Sedated on fentanyl 100, versed 4  General:  critically ill adult female intubated/ sedated on MV HEENT: MM pink/moist, ETT/ OGT, pupils 2/sluggish Neuro: sedated CV: rr, SR 65, ?slight murmur, apex, R IJ CVL PULM:  MV supported breaths, CTA, reduced PEEP to 10 GI: soft, bs+, ND, foley Extremities: warm/dry, no LE edema  Skin: no rashes  UOP 2.6L/ 24hrs  No CXR today afebrile Labs> coox 57, K 3.4, sCr 1.77, BUN 30, Mag 2.6, WBC 20> 15.8  Resolved Hospital Problem list     Assessment & Plan:   Acute hypoxemic respiratory failure secondary to flash pulmonary edema in the setting of severe MR.  P:  - Continue MV support, 4-8cc/kg IBW with goal Pplat <30 and DP<15  - VAP prevention protocol/ PPI - PAD protocol for sedation> fentanyl/ versed, RASS goal 0/-1 - wean PEEP/ FiO2 as able for SpO2 >92%.  Weaning FiO2/ peep today  -  daily SAT & SBT when meets criteria.   - holding diuretics today  - check ABG now> ? Need to reduce MV, rule out resp alk   Cardiogenic shock Brief PEA arrest 5/17 - per HF - weaning NE as able - trending coox, 57% today - milrinone per HF, currently at 0.25 - holding diurectics with CVP and increase in sCr  - lactic clearing last night  - cont CVL/ aline, trend CVP - purposeful movements, avoid fever   Valvular heart disease: Severe MR, mild to mod TR. Trivial AR. Acute on chronic HFpEF - per HF, planned TEE today  -  holding duresis today  - eventually will need R/LHC and MVR   Atrial flutter with RVR: CHA2DS2VASc score 1 - spont converted to SR - cont amio gtt - heparin gtt per pharmacy  - goal K> 4, Mag > 2  AKI - sCr 0.96> 1.77 - cont hemodynamic support as above, holding diuretics today  - trend renal indices/ UOP/ strict I/Os, daily wts    Substance abuse: THC and cocaine on UDS - Cessation counseling when appropriate.   Mild hyperglycemia - start CBGs q 4, add SSI if > 180  Best Practice (right click and "Reselect all SmartList Selections" daily)   Diet/type: NPO; start TF after TEE today  DVT prophylaxis: systemic heparin GI prophylaxis: PPI Lines: Central line and Arterial Line Foley:  Yes, and it is still needed Code Status:  full code Last date of multidisciplinary goals of care discussion [ ]  per primary team.   No family at bedside.   Labs   CBC: Recent Labs  Lab 02/07/22 1749 02/07/22 1812 02/08/22 0351 02/08/22 1344 02/08/22 1530 02/08/22 1637 02/08/22 1833 02/08/22 2235 02/09/22 0426  WBC 10.1  --  12.0*  --   --  20.0*  --   --  15.8*  NEUTROABS 6.9  --   --   --   --   --   --   --   --   HGB 13.7   < > 12.2   < > 13.9 13.0 13.6 12.6 12.3  HCT 42.2   < > 37.1   < > 41.0 40.4 40.0 37.0 38.5  MCV 89.4  --  89.6  --   --  89.2  --   --  89.7  PLT 285  --  252  --   --  258  --   --  263   < > = values in this interval not displayed.    Basic Metabolic Panel: Recent Labs  Lab 02/07/22 1749 02/07/22 1812 02/07/22 2131 02/08/22 0351 02/08/22 1344 02/08/22 1530 02/08/22 1637 02/08/22 1833 02/08/22 2235 02/09/22 0426  NA 137 138  --  140   < > 142 139 142 143 141  K 4.5 4.6  --  3.9   < > 3.3* 3.5 3.5 3.7 3.7  CL 108 109  --  109  --   --  104  --   --  104  CO2 19*  --   --  23  --   --  25  --   --  27  GLUCOSE 120* 115*  --  136*  --   --  220*  --   --  150*  BUN 27* 35*  --  27*  --   --  28*  --   --  30*  CREATININE 1.35* 1.20*  --  1.22*   --   --  1.70*  --   --  1.77*  CALCIUM 9.1  --   --  8.4*  --   --  8.2*  --   --  8.1*  MG  --   --  1.9  --   --   --  1.6*  --   --  2.6*  PHOS  --   --   --   --   --   --  3.7  --   --   --    < > = values in this interval not displayed.   GFR: Estimated Creatinine Clearance: 34.1 mL/min (A) (by C-G formula based on SCr of 1.77 mg/dL (H)). Recent Labs  Lab 02/07/22 1749 02/08/22 0351 02/08/22 1637 02/08/22 1831 02/08/22 2108 02/09/22 0426  WBC 10.1 12.0* 20.0*  --   --  15.8*  LATICACIDVEN  --   --  3.5* 2.6* 1.9  --     Liver Function Tests: Recent Labs  Lab 02/07/22 1749 02/08/22 0351 02/08/22 1637 02/09/22 0426  AST 122* 98* 106* 87*  ALT 169* 146* 147* 134*  ALKPHOS 125 113 121 108  BILITOT 1.9* 1.4* 1.7* 1.8*  PROT 6.3* 5.4* 5.6* 5.3*  ALBUMIN 3.2* 2.7* 2.7* 2.5*   No results for input(s): LIPASE, AMYLASE in the last 168 hours. No results for input(s): AMMONIA in the last 168 hours.  ABG    Component Value Date/Time   PHART 7.479 (H) 02/08/2022 2235   PCO2ART 34.1 02/08/2022 2235   PO2ART 165 (H) 02/08/2022 2235   HCO3 25.4 02/08/2022 2235   TCO2 26 02/08/2022 2235   ACIDBASEDEF 12.0 (H) 02/08/2022 1407   O2SAT 57.2 02/09/2022 0440     Coagulation Profile: Recent Labs  Lab 02/07/22 2131  INR 1.3*    Cardiac Enzymes: No results for input(s): CKTOTAL, CKMB, CKMBINDEX, TROPONINI in the last 168 hours.  HbA1C: Hgb A1c MFr Bld  Date/Time Value Ref Range Status  02/07/2022 09:31 PM 5.3 4.8 - 5.6 % Final    Comment:    (NOTE) Pre diabetes:          5.7%-6.4%  Diabetes:              >6.4%  Glycemic control for   <7.0% adults with diabetes     CBG: Recent Labs  Lab 02/09/22 0745  GLUCAP 112*    Critical care time: 32 minutes      Kennieth Rad, ACNP [facility] 02/09/2022, 9:33 AM  See Amion for pager If no response to pager, please call PCCM consult pager After 7:00 pm call Elink    Attending  Attestation (patient seen/dexamined 5/18 am)  I agree with the Advanced Practitioner's note, impression, and recommendations as outlined. I have taken an independent interval history, reviewed the chart and examined the patient.  My medical decision making is as follows:   Subjective: Wakes up easily on fentanyl/versed and follows commands  Objective: Vitals:   02/10/22 0730 02/10/22 0744  BP:    Pulse:  72  Resp:  16  Temp: 99.5 F (37.5 C)   SpO2:  99%    Gen:      Intubated, sedated, acutely ill appearing HEENT:  ETT to vent Lungs:    sounds of mechanical ventilation auscultated no wheze CV:         systolic murmur, tachycardic, regular Abd:      + bowel sounds; soft, non-tender; no palpable masses, no distension Ext:  No edema Skin:      Warm and dry; no rashes Neuro:   sedated, RASS -1 to +1, moves all 4 extremities   Labs/Imaging: Na 141 K 3.7 Cr 1.77 Mag 2.6 WBC 15.8 Coox 57  Assessment and Plan:  Acute hypoxemic respiratory failure Acute pulmonary edema Cardiogenic shock Severe MR, rheumatic heart disease AKI  Plan is for ventilator liberation when able. Discussed ongoing plans for definitive therapies for her severe MR, but she does not need to stay intubated per HF team.  They will do a TEE today.  We will work towards lightening sedation and SBT.  Titrate down pressors as tolerated goal MAP>65  The patient is critically ill due to respiratory failure, cardiogenic sohck with multiple organ systems failure and requires high complexity decision making for assessment and support, frequent evaluation and titration of therapies, application of advanced monitoring technologies and extensive interpretation of multiple databases.   Critical Care Time devoted to patient care services described in this note is 34 minutes. This time reflects time of care of this [place] . This critical care time does not reflect separately billable procedures or  procedure time, teaching time and supervisory time of PA/NP/Med student/Med Resident etc but could involve care discussion time.  Spero Geralds [facility] 02/10/2022 8:03 AM  Pager: see AMION  If no response to pager, please call critical care on call (see AMION) until 7pm After 7:00 pm call Elink

## 2022-02-09 NOTE — Progress Notes (Signed)
ANTICOAGULATION CONSULT NOTE - Follow Up Consult  Pharmacy Consult for heparin gtt Indication: atrial fibrillation  No Known Allergies  Patient Measurements: Weight: 67.4 kg (148 lb 9.4 oz) Heparin Dosing Weight: 68.1 kg  Vital Signs: Temp: 98.4 F (36.9 C) (05/18 1625) Temp Source: Oral (05/18 1625) BP: 105/63 (05/18 2000) Pulse Rate: 65 (05/18 2045)  Labs: Recent Labs    02/07/22 2131 02/08/22 0351 02/08/22 1150 02/08/22 1637 02/08/22 1831 02/08/22 1833 02/08/22 2015 02/08/22 2215 02/08/22 2235 02/09/22 0426 02/09/22 0956 02/09/22 1331 02/09/22 1942  HGB  --  12.2   < > 13.0  --    < >  --   --  12.6 12.3 12.6  --   --   HCT  --  37.1   < > 40.4  --    < >  --   --  37.0 38.5 37.0  --   --   PLT  --  252  --  258  --   --   --   --   --  263  --   --   --   LABPROT 15.7*  --   --   --   --   --   --   --   --   --   --   --   --   INR 1.3*  --   --   --   --   --   --   --   --   --   --   --   --   HEPARINUNFRC  --  0.23*   < >  --   --   --   --   --   --  0.80*  --  0.60 0.52  CREATININE  --  1.22*  --  1.70*  --   --   --   --   --  1.77*  --   --   --   TROPONINIHS 27*  --   --   --  104*  --  155* 239*  --   --   --   --   --    < > = values in this interval not displayed.     Estimated Creatinine Clearance: 34.1 mL/min (A) (by C-G formula based on SCr of 1.77 mg/dL (H)).  Assessment: 1 yof with a history of SVT, mitral regurg TR, AI, anxiety. Patient is presenting with tachycardia. Heparin per pharmacy consult placed for atrial fibrillation. Patient is not on anticoagulation prior to arrival.   Most recent heparin level is therapeutic at 0.6. No issues noted per chart review.  Heparin level came back therapeutic again tonight. Cont same rate.  Goal of Therapy:  Heparin level 0.3-0.7 units/ml Monitor platelets by anticoagulation protocol: Yes   Plan:  Continue heparin 1000 units/hr  Daily CBC Monitor for s/sx of bleeding   Onnie Boer, PharmD,  BCIDP, AAHIVP, CPP Infectious Disease Pharmacist 02/09/2022 8:58 PM

## 2022-02-10 ENCOUNTER — Encounter (HOSPITAL_COMMUNITY)
Admission: EM | Disposition: A | Discharge status: Rehabilitation Facility | Payer: Self-pay | Source: Home / Self Care | Attending: Internal Medicine

## 2022-02-10 ENCOUNTER — Encounter (HOSPITAL_COMMUNITY): Payer: Self-pay | Admitting: Cardiovascular Disease

## 2022-02-10 DIAGNOSIS — I340 Nonrheumatic mitral (valve) insufficiency: Secondary | ICD-10-CM

## 2022-02-10 DIAGNOSIS — J9601 Acute respiratory failure with hypoxia: Secondary | ICD-10-CM

## 2022-02-10 DIAGNOSIS — I2510 Atherosclerotic heart disease of native coronary artery without angina pectoris: Secondary | ICD-10-CM

## 2022-02-10 DIAGNOSIS — R570 Cardiogenic shock: Secondary | ICD-10-CM

## 2022-02-10 DIAGNOSIS — I483 Typical atrial flutter: Secondary | ICD-10-CM

## 2022-02-10 DIAGNOSIS — I361 Nonrheumatic tricuspid (valve) insufficiency: Secondary | ICD-10-CM

## 2022-02-10 HISTORY — PX: RIGHT/LEFT HEART CATH AND CORONARY ANGIOGRAPHY: CATH118266

## 2022-02-10 LAB — POCT I-STAT 7, (LYTES, BLD GAS, ICA,H+H)
Acid-Base Excess: 7 mmol/L — ABNORMAL HIGH (ref 0.0–2.0)
Bicarbonate: 29.9 mmol/L — ABNORMAL HIGH (ref 20.0–28.0)
Calcium, Ion: 1.05 mmol/L — ABNORMAL LOW (ref 1.15–1.40)
HCT: 30 % — ABNORMAL LOW (ref 36.0–46.0)
Hemoglobin: 10.2 g/dL — ABNORMAL LOW (ref 12.0–15.0)
O2 Saturation: 100 %
Potassium: 3.4 mmol/L — ABNORMAL LOW (ref 3.5–5.1)
Sodium: 142 mmol/L (ref 135–145)
TCO2: 31 mmol/L (ref 22–32)
pCO2 arterial: 36.7 mmHg (ref 32–48)
pH, Arterial: 7.52 — ABNORMAL HIGH (ref 7.35–7.45)
pO2, Arterial: 156 mmHg — ABNORMAL HIGH (ref 83–108)

## 2022-02-10 LAB — BASIC METABOLIC PANEL
Anion gap: 7 (ref 5–15)
BUN: 24 mg/dL — ABNORMAL HIGH (ref 6–20)
CO2: 29 mmol/L (ref 22–32)
Calcium: 7.8 mg/dL — ABNORMAL LOW (ref 8.9–10.3)
Chloride: 106 mmol/L (ref 98–111)
Creatinine, Ser: 1.43 mg/dL — ABNORMAL HIGH (ref 0.44–1.00)
GFR, Estimated: 43 mL/min — ABNORMAL LOW (ref >60)
Glucose, Bld: 144 mg/dL — ABNORMAL HIGH (ref 70–99)
Potassium: 3.7 mmol/L (ref 3.5–5.1)
Sodium: 142 mmol/L (ref 135–145)

## 2022-02-10 LAB — CBC
HCT: 32.1 % — ABNORMAL LOW (ref 36.0–46.0)
Hemoglobin: 10.3 g/dL — ABNORMAL LOW (ref 12.0–15.0)
MCH: 28.8 pg (ref 26.0–34.0)
MCHC: 32.1 g/dL (ref 30.0–36.0)
MCV: 89.7 fL (ref 80.0–100.0)
Platelets: 221 10*3/uL (ref 150–400)
RBC: 3.58 MIL/uL — ABNORMAL LOW (ref 3.87–5.11)
RDW: 18.3 % — ABNORMAL HIGH (ref 11.5–15.5)
WBC: 13.4 10*3/uL — ABNORMAL HIGH (ref 4.0–10.5)
nRBC: 0 % (ref 0.0–0.2)

## 2022-02-10 LAB — POCT I-STAT EG7
Acid-Base Excess: 4 mmol/L — ABNORMAL HIGH (ref 0.0–2.0)
Acid-Base Excess: 4 mmol/L — ABNORMAL HIGH (ref 0.0–2.0)
Acid-Base Excess: 6 mmol/L — ABNORMAL HIGH (ref 0.0–2.0)
Bicarbonate: 27.6 mmol/L (ref 20.0–28.0)
Bicarbonate: 28.3 mmol/L — ABNORMAL HIGH (ref 20.0–28.0)
Bicarbonate: 30 mmol/L — ABNORMAL HIGH (ref 20.0–28.0)
Calcium, Ion: 0.9 mmol/L — ABNORMAL LOW (ref 1.15–1.40)
Calcium, Ion: 0.98 mmol/L — ABNORMAL LOW (ref 1.15–1.40)
Calcium, Ion: 1.1 mmol/L — ABNORMAL LOW (ref 1.15–1.40)
HCT: 30 % — ABNORMAL LOW (ref 36.0–46.0)
HCT: 30 % — ABNORMAL LOW (ref 36.0–46.0)
HCT: 32 % — ABNORMAL LOW (ref 36.0–46.0)
Hemoglobin: 10.2 g/dL — ABNORMAL LOW (ref 12.0–15.0)
Hemoglobin: 10.2 g/dL — ABNORMAL LOW (ref 12.0–15.0)
Hemoglobin: 10.9 g/dL — ABNORMAL LOW (ref 12.0–15.0)
O2 Saturation: 78 %
O2 Saturation: 80 %
O2 Saturation: 85 %
Potassium: 3.1 mmol/L — ABNORMAL LOW (ref 3.5–5.1)
Potassium: 3.3 mmol/L — ABNORMAL LOW (ref 3.5–5.1)
Potassium: 3.5 mmol/L (ref 3.5–5.1)
Sodium: 143 mmol/L (ref 135–145)
Sodium: 145 mmol/L (ref 135–145)
Sodium: 147 mmol/L — ABNORMAL HIGH (ref 135–145)
TCO2: 29 mmol/L (ref 22–32)
TCO2: 29 mmol/L (ref 22–32)
TCO2: 31 mmol/L (ref 22–32)
pCO2, Ven: 36.8 mmHg — ABNORMAL LOW (ref 44–60)
pCO2, Ven: 38.4 mmHg — ABNORMAL LOW (ref 44–60)
pCO2, Ven: 39 mmHg — ABNORMAL LOW (ref 44–60)
pH, Ven: 7.475 — ABNORMAL HIGH (ref 7.25–7.43)
pH, Ven: 7.484 — ABNORMAL HIGH (ref 7.25–7.43)
pH, Ven: 7.493 — ABNORMAL HIGH (ref 7.25–7.43)
pO2, Ven: 39 mmHg (ref 32–45)
pO2, Ven: 40 mmHg (ref 32–45)
pO2, Ven: 46 mmHg — ABNORMAL HIGH (ref 32–45)

## 2022-02-10 LAB — COMPREHENSIVE METABOLIC PANEL
ALT: 86 U/L — ABNORMAL HIGH (ref 0–44)
AST: 44 U/L — ABNORMAL HIGH (ref 15–41)
Albumin: 2.1 g/dL — ABNORMAL LOW (ref 3.5–5.0)
Alkaline Phosphatase: 85 U/L (ref 38–126)
Anion gap: 5 (ref 5–15)
BUN: 29 mg/dL — ABNORMAL HIGH (ref 6–20)
CO2: 28 mmol/L (ref 22–32)
Calcium: 8.1 mg/dL — ABNORMAL LOW (ref 8.9–10.3)
Chloride: 110 mmol/L (ref 98–111)
Creatinine, Ser: 1.49 mg/dL — ABNORMAL HIGH (ref 0.44–1.00)
GFR, Estimated: 41 mL/min — ABNORMAL LOW (ref >60)
Glucose, Bld: 132 mg/dL — ABNORMAL HIGH (ref 70–99)
Potassium: 3.1 mmol/L — ABNORMAL LOW (ref 3.5–5.1)
Sodium: 143 mmol/L (ref 135–145)
Total Bilirubin: 1.3 mg/dL — ABNORMAL HIGH (ref 0.3–1.2)
Total Protein: 4.7 g/dL — ABNORMAL LOW (ref 6.5–8.1)

## 2022-02-10 LAB — GLUCOSE, CAPILLARY
Glucose-Capillary: 104 mg/dL — ABNORMAL HIGH (ref 70–99)
Glucose-Capillary: 121 mg/dL — ABNORMAL HIGH (ref 70–99)
Glucose-Capillary: 125 mg/dL — ABNORMAL HIGH (ref 70–99)
Glucose-Capillary: 128 mg/dL — ABNORMAL HIGH (ref 70–99)
Glucose-Capillary: 129 mg/dL — ABNORMAL HIGH (ref 70–99)
Glucose-Capillary: 150 mg/dL — ABNORMAL HIGH (ref 70–99)
Glucose-Capillary: 87 mg/dL (ref 70–99)

## 2022-02-10 LAB — MAGNESIUM: Magnesium: 1.9 mg/dL (ref 1.7–2.4)

## 2022-02-10 LAB — HEPARIN LEVEL (UNFRACTIONATED): Heparin Unfractionated: 0.6 IU/mL (ref 0.30–0.70)

## 2022-02-10 LAB — PHOSPHORUS
Phosphorus: 2.3 mg/dL — ABNORMAL LOW (ref 2.5–4.6)
Phosphorus: 4.1 mg/dL (ref 2.5–4.6)

## 2022-02-10 LAB — COOXEMETRY PANEL
Carboxyhemoglobin: 1.7 % — ABNORMAL HIGH (ref 0.5–1.5)
Methemoglobin: 1 % (ref 0.0–1.5)
O2 Saturation: 71.6 %
Total hemoglobin: 10.6 g/dL — ABNORMAL LOW (ref 12.0–16.0)

## 2022-02-10 SURGERY — RIGHT/LEFT HEART CATH AND CORONARY ANGIOGRAPHY
Anesthesia: LOCAL

## 2022-02-10 MED ORDER — HEPARIN (PORCINE) IN NACL 1000-0.9 UT/500ML-% IV SOLN
INTRAVENOUS | Status: DC | PRN
  Administered 2022-02-10 (×2): 500 mL

## 2022-02-10 MED ORDER — POTASSIUM CHLORIDE 20 MEQ PO PACK: 40.0000 meq | PACK | ORAL | Status: DC

## 2022-02-10 MED ORDER — LIDOCAINE HCL (PF) 1 % IJ SOLN
INTRAMUSCULAR | Status: AC
  Filled 2022-02-10: qty 30

## 2022-02-10 MED ORDER — POTASSIUM PHOSPHATES 15 MMOLE/5ML IV SOLN
30.0000 mmol | Freq: Once | INTRAVENOUS | Status: AC
  Administered 2022-02-10: 30 mmol via INTRAVENOUS
  Filled 2022-02-10: qty 10

## 2022-02-10 MED ORDER — VITAL AF 1.2 CAL PO LIQD
1000.0000 mL | ORAL | Status: DC
Start: 2022-02-10 — End: 2022-02-14
  Administered 2022-02-10 – 2022-02-14 (×4): 1000 mL

## 2022-02-10 MED ORDER — HEPARIN SODIUM (PORCINE) 1000 UNIT/ML IJ SOLN
INTRAMUSCULAR | Status: AC
  Filled 2022-02-10: qty 10

## 2022-02-10 MED ORDER — SODIUM CHLORIDE 0.9% FLUSH: 3.0000 mL | INTRAVENOUS | Status: DC | PRN

## 2022-02-10 MED ORDER — SODIUM CHLORIDE 0.9 % IV SOLN: 250.0000 mL | INTRAVENOUS | Status: DC | PRN

## 2022-02-10 MED ORDER — SODIUM CHLORIDE 0.9 % IV SOLN
INTRAVENOUS | Status: DC
Start: 2022-02-13 — End: 2022-02-10

## 2022-02-10 MED ORDER — DEXMEDETOMIDINE HCL IN NACL 400 MCG/100ML IV SOLN
0.4000 ug/kg/h | INTRAVENOUS | Status: DC
  Administered 2022-02-10 – 2022-02-11 (×2): 0.4 ug/kg/h via INTRAVENOUS
  Administered 2022-02-11: 0.5 ug/kg/h via INTRAVENOUS
  Administered 2022-02-11: 0.9 ug/kg/h via INTRAVENOUS
  Administered 2022-02-12 (×2): 1 ug/kg/h via INTRAVENOUS
  Administered 2022-02-12 (×2): 1.2 ug/kg/h via INTRAVENOUS
  Administered 2022-02-13: 0.4 ug/kg/h via INTRAVENOUS
  Administered 2022-02-13: 1.2 ug/kg/h via INTRAVENOUS
  Administered 2022-02-14: 0.4 ug/kg/h via INTRAVENOUS
  Filled 2022-02-10 (×10): qty 100

## 2022-02-10 MED ORDER — FUROSEMIDE 10 MG/ML IJ SOLN
60.0000 mg | Freq: Once | INTRAMUSCULAR | Status: AC
  Administered 2022-02-10: 60 mg via INTRAVENOUS
  Filled 2022-02-10: qty 6

## 2022-02-10 MED ORDER — POTASSIUM CHLORIDE 20 MEQ PO PACK
20.0000 meq | PACK | ORAL | Status: DC
  Administered 2022-02-10: 20 meq
  Filled 2022-02-10: qty 1

## 2022-02-10 MED ORDER — LIDOCAINE HCL (PF) 1 % IJ SOLN
INTRAMUSCULAR | Status: DC | PRN
  Administered 2022-02-10 (×3): 2 mL via INTRADERMAL

## 2022-02-10 MED ORDER — AMIODARONE LOAD VIA INFUSION
150.0000 mg | INTRAVENOUS | Status: AC
  Administered 2022-02-10 (×2): 150 mg via INTRAVENOUS
  Filled 2022-02-10 (×2): qty 83.34

## 2022-02-10 MED ORDER — POTASSIUM CHLORIDE 10 MEQ/50ML IV SOLN
10.0000 meq | INTRAVENOUS | Status: DC
  Administered 2022-02-10 (×2): 10 meq via INTRAVENOUS
  Filled 2022-02-10 (×2): qty 50

## 2022-02-10 MED ORDER — SPIRONOLACTONE 12.5 MG HALF TABLET
12.5000 mg | ORAL_TABLET | Freq: Every day | ORAL | Status: DC
  Administered 2022-02-10 – 2022-02-13 (×4): 12.5 mg
  Filled 2022-02-10 (×5): qty 1

## 2022-02-10 MED ORDER — VERAPAMIL HCL 2.5 MG/ML IV SOLN
INTRAVENOUS | Status: DC | PRN
  Administered 2022-02-10: 10 mL via INTRA_ARTERIAL

## 2022-02-10 MED ORDER — VERAPAMIL HCL 2.5 MG/ML IV SOLN
INTRAVENOUS | Status: AC
  Filled 2022-02-10: qty 2

## 2022-02-10 MED ORDER — CALCIUM GLUCONATE-NACL 1-0.675 GM/50ML-% IV SOLN
1.0000 g | Freq: Once | INTRAVENOUS | Status: AC
  Administered 2022-02-10: 1000 mg via INTRAVENOUS
  Filled 2022-02-10: qty 50

## 2022-02-10 MED ORDER — IOHEXOL 350 MG/ML SOLN
INTRAVENOUS | Status: DC | PRN
  Administered 2022-02-10: 45 mL

## 2022-02-10 MED ORDER — SODIUM CHLORIDE 0.9% FLUSH
3.0000 mL | Freq: Two times a day (BID) | INTRAVENOUS | Status: DC
  Administered 2022-02-13 – 2022-02-20 (×14): 3 mL via INTRAVENOUS

## 2022-02-10 MED ORDER — HEPARIN SODIUM (PORCINE) 1000 UNIT/ML IJ SOLN
INTRAMUSCULAR | Status: DC | PRN
  Administered 2022-02-10: 3500 [IU] via INTRAVENOUS

## 2022-02-10 MED ORDER — HEPARIN (PORCINE) IN NACL 1000-0.9 UT/500ML-% IV SOLN
INTRAVENOUS | Status: AC
  Filled 2022-02-10: qty 1000

## 2022-02-10 MED ORDER — ASPIRIN 81 MG PO CHEW
81.0000 mg | CHEWABLE_TABLET | ORAL | Status: DC
Start: 2022-02-13 — End: 2022-02-10

## 2022-02-10 MED ORDER — POTASSIUM CHLORIDE 20 MEQ PO PACK
20.0000 meq | PACK | Freq: Two times a day (BID) | ORAL | Status: DC
  Administered 2022-02-10 (×2): 20 meq via ORAL
  Filled 2022-02-10 (×2): qty 1

## 2022-02-10 MED ORDER — HEPARIN (PORCINE) 25000 UT/250ML-% IV SOLN
1200.0000 [IU]/h | INTRAVENOUS | Status: DC
  Administered 2022-02-10: 1000 [IU]/h via INTRAVENOUS
  Administered 2022-02-12 – 2022-02-15 (×4): 1100 [IU]/h via INTRAVENOUS
  Administered 2022-02-16 – 2022-02-18 (×3): 1200 [IU]/h via INTRAVENOUS
  Filled 2022-02-10 (×9): qty 250

## 2022-02-10 SURGICAL SUPPLY — 14 items
BAND CMPR LRG ZPHR (HEMOSTASIS) ×1
BAND ZEPHYR COMPRESS 30 LONG (HEMOSTASIS) ×1 IMPLANT
CATH 5FR JL3.5 JR4 ANG PIG MP (CATHETERS) ×1 IMPLANT
CATH BALLN WEDGE 5F 110CM (CATHETERS) ×1 IMPLANT
GLIDESHEATH SLEND SS 6F .021 (SHEATH) ×1 IMPLANT
GUIDEWIRE .025 260CM (WIRE) ×1 IMPLANT
GUIDEWIRE INQWIRE 1.5J.035X260 (WIRE) IMPLANT
INQWIRE 1.5J .035X260CM (WIRE) ×2
KIT MICROPUNCTURE NIT STIFF (SHEATH) ×1 IMPLANT
MAT PREVALON FULL STRYKER (MISCELLANEOUS) ×1 IMPLANT
PACK CARDIAC CATHETERIZATION (CUSTOM PROCEDURE TRAY) ×2 IMPLANT
SHEATH GLIDE SLENDER 4/5FR (SHEATH) ×1 IMPLANT
SHEATH PROBE COVER 6X72 (BAG) ×1 IMPLANT
TRANSDUCER W/STOPCOCK (MISCELLANEOUS) ×2 IMPLANT

## 2022-02-10 NOTE — Progress Notes (Signed)
Patient developed SVT with rate 170-200's. Dr Gala Romney called to the bedside. Amiodarone bolus given x 2 and DCCV performed by Bensimhon MD with return of sinus rhythim.

## 2022-02-10 NOTE — CV Procedure (Signed)
    DIRECT CURRENT CARDIOVERSION  NAME:  Lisa Ayala   MRN: [id number] DOB:  01/05/1966   ADMIT DATE: 02/07/2022   INDICATIONS: SVT   PROCEDURE:   Emergent consent. The patient was on the vent and underwent further sedation. Once an appropriate level of sedation was achieved, the patient received a single biphasic, synchronized 150J shock with prompt conversion to sinus rhythm. No apparent complications.  Glori Bickers, MD  8:13 AM

## 2022-02-10 NOTE — Progress Notes (Signed)
ANTICOAGULATION CONSULT NOTE - Follow Up Consult  Pharmacy Consult for heparin gtt Indication: atrial fibrillation  No Known Allergies  Patient Measurements: Weight: 69.3 kg (152 lb 12.5 oz) Heparin Dosing Weight: 68.1 kg  Vital Signs: Temp: 98.2 F (36.8 C) (05/19 0400) Temp Source: Axillary (05/19 0400) BP: 102/62 (05/19 0600) Pulse Rate: 71 (05/19 0630)  Labs: Recent Labs    02/07/22 2131 02/08/22 0351 02/08/22 1637 02/08/22 1831 02/08/22 1833 02/08/22 2015 02/08/22 2215 02/08/22 2235 02/09/22 0426 02/09/22 0956 02/09/22 1331 02/09/22 1942 02/10/22 0402  HGB  --    < > 13.0  --    < >  --   --    < > 12.3 12.6  --   --  10.3*  HCT  --    < > 40.4  --    < >  --   --    < > 38.5 37.0  --   --  32.1*  PLT  --    < > 258  --   --   --   --   --  263  --   --   --  221  LABPROT 15.7*  --   --   --   --   --   --   --   --   --   --   --   --   INR 1.3*  --   --   --   --   --   --   --   --   --   --   --   --   HEPARINUNFRC  --    < >  --   --   --   --   --   --  0.80*  --  0.60 0.52 0.60  CREATININE  --    < > 1.70*  --   --   --   --   --  1.77*  --   --   --  1.49*  TROPONINIHS 27*  --   --  104*  --  155* 239*  --   --   --   --   --   --    < > = values in this interval not displayed.     Estimated Creatinine Clearance: 40.5 mL/min (A) (by C-G formula based on SCr of 1.49 mg/dL (H)).  Assessment: 82 yof with a history of SVT, mitral regurg TR, AI, anxiety. Patient is presenting with tachycardia. Heparin per pharmacy consult placed for atrial fibrillation. Patient is not on anticoagulation prior to arrival.   Most recent heparin level is therapeutic at 0.6 on heparin 1000 units/hr. No issues noted per chart review.  Goal of Therapy:  Heparin level 0.3-0.7 units/ml Monitor platelets by anticoagulation protocol: Yes   Plan:  Continue heparin 1000 units/hr  Daily heparin level and CBC Monitor for s/sx of bleeding   Thank you for including pharmacy in  the care of this patient.  Zenaida Deed, PharmD PGY1 Acute Care Pharmacy Resident  Phone: [phone] 02/10/2022  7:13 AM  Please check [email or web address] for unit-specific pharmacy phone numbers.

## 2022-02-10 NOTE — Progress Notes (Signed)
Initial Nutrition Assessment  DOCUMENTATION CODES:   Non-severe (moderate) malnutrition in context of chronic illness  INTERVENTION:   Tube Feeding via OG:  Vital AF 1.2 at 60 ml/hr This provide 1728 kcals, 108 g of protein and 1166 mL of free water   NUTRITION DIAGNOSIS:   Moderate Malnutrition related to chronic illness as evidenced by mild fat depletion, mild muscle depletion.  GOAL:   Patient will meet greater than or equal to 90% of their needs  MONITOR:   TF tolerance, Vent status, Labs, Weight trends  REASON FOR ASSESSMENT:   Ventilator, Consult Enteral/tube feeding initiation and management  ASSESSMENT:   56 yo female admitted with acute respiratory failure, acute pulmonary edema, brief cardiac arrest. PMH includes aortic insufficiency, severe MR, tricuspid regurgitation  5/16 Admitted 5/17 Decompensated in Endo prior to TEE, Intubated, Brief arrest 5/19 SVT requiring emergent cardioversion  Pt remains on vent support, cath lab today Sedated on fentanyl and versed, precedex. Pt requiring levophed  OG tube with tip in antrum Adult TF protocol initiated over night, tolerating Vital High Protein at 40 ml/hr  Admit weight 67.4 kg; current wt 69.3 kg  Unable to obtain diet and weight history  Labs: potassium 3.1 (L), phosphorus 2.3 (L) Meds: miralax, protonix, colace  NUTRITION - FOCUSED PHYSICAL EXAM:  Flowsheet Row Most Recent Value  Orbital Region Mild depletion  Upper Arm Region No depletion  Thoracic and Lumbar Region Mild depletion  Buccal Region Unable to assess  Temple Region Moderate depletion  Clavicle Bone Region Moderate depletion  Clavicle and Acromion Bone Region Moderate depletion  Scapular Bone Region Moderate depletion  Dorsal Hand Unable to assess  Patellar Region Mild depletion  Anterior Thigh Region Mild depletion  Posterior Calf Region Unable to assess  Edema (RD Assessment) None       Diet Order:   Diet Order     None        EDUCATION NEEDS:   Not appropriate for education at this time  Skin:  Skin Assessment: Reviewed RN Assessment  Last BM:  PTA  Height:   Ht Readings from Last 1 Encounters:  02/07/22 5\' 7"  (1.702 m)    Weight:   Wt Readings from Last 1 Encounters:  02/10/22 69.3 kg      BMI:  Body mass index is 23.93 kg/m.  Estimated Nutritional Needs:   Kcal:  [id number] kcals  Protein:  85-105 g  Fluid:  >/= 1.7 L   Kerman Passey MS, RDN, LDN, CNSC Registered Dietitian III Clinical Nutrition RD Pager and On-Call Pager Number Located in [place]

## 2022-02-10 NOTE — Interval H&P Note (Signed)
History and Physical Interval Note:  02/10/2022 12:32 PM  Lisa Ayala  has presented today for surgery, with the diagnosis of heart failure, severe mr.  The various methods of treatment have been discussed with the patient and family. After consideration of risks, benefits and other options for treatment, the patient has consented to  Procedure(s): RIGHT/LEFT HEART CATH AND CORONARY ANGIOGRAPHY (N/A) as a surgical intervention.  The patient's history has been reviewed, patient examined, no change in status, stable for surgery.  I have reviewed the patient's chart and labs.  Questions were answered to the patient's satisfaction.     Katrice Goel

## 2022-02-10 NOTE — Progress Notes (Signed)
IVT consulted for difficult PIV start.  Upon assessment, pt has sufficient assess.  RN confirmed at bedside.

## 2022-02-10 NOTE — Progress Notes (Signed)
Advanced Heart Failure Rounding Note  PCP-Cardiologist: Will Meredith Leeds, MD   Subjective:   5/17 Flash pulmonary edema. PEA arrest. Intubated. ACLS 1-2 min. ROSC. Placed on milrinone, norepi, and lasix drip.   Remains intubated/seated.   TEE 5/18 EF 60-65% severe MR. Mild AI    On NE 2 milrinone 0.25 Co-ox 72%  Renal function improving  Developed SVT at 200 this am. Emergent DC-CV   Objective:   Weight Range: 69.3 kg Body mass index is 23.93 kg/m.   Vital Signs:   Temp:  [97.9 F (36.6 C)-99.5 F (37.5 C)] 99.5 F (37.5 C) (05/19 0730) Pulse Rate:  [58-85] 71 (05/19 0630) Resp:  [10-24] 19 (05/19 0630) BP: (100-133)/(62-93) 102/62 (05/19 0600) SpO2:  [98 %-100 %] 99 % (05/19 0630) Arterial Line BP: (102-168)/(47-75) 125/52 (05/19 0630) FiO2 (%):  [40 %-50 %] 40 % (05/19 0438) Weight:  [69.3 kg] 69.3 kg (05/19 0409) Last BM Date :  (PTA)  Weight change: Filed Weights   02/09/22 0420 02/10/22 0409  Weight: 67.4 kg 69.3 kg    Intake/Output:   Intake/Output Summary (Last 24 hours) at 02/10/2022 0734 Last data filed at 02/10/2022 0600 Gross per 24 hour  Intake 2166.39 ml  Output 1295 ml  Net 871.39 ml       Physical Exam   General:  Intubated seated HEENT: normal + ETT Neck: supple. RIJ TLCCarotids 2+ bilat; no bruits. No lymphadenopathy or thryomegaly appreciated. Cor: PMI nondisplaced. Regular rate & rhythm. 2/6 MR Lungs: clear Abdomen: soft, nontender, nondistended. No hepatosplenomegaly. No bruits or masses. Good bowel sounds. Extremities: no cyanosis, clubbing, rash, edema Neuro: intubated/sedated   Telemetry   SR 70s Personally reviewed  Labs    CBC Recent Labs    02/07/22 1749 02/07/22 1812 02/09/22 0426 02/09/22 0956 02/10/22 0402  WBC 10.1   < > 15.8*  --  13.4*  NEUTROABS 6.9  --   --   --   --   HGB 13.7   < > 12.3 12.6 10.3*  HCT 42.2   < > 38.5 37.0 32.1*  MCV 89.4   < > 89.7  --  89.7  PLT 285   < > 263  --  221    < > = values in this interval not displayed.    Basic Metabolic Panel Recent Labs    02/08/22 1637 02/08/22 1833 02/09/22 0426 02/09/22 0956 02/10/22 0402  NA 139   < > 141 144 143  K 3.5   < > 3.7 3.7 3.1*  CL 104  --  104  --  110  CO2 25  --  27  --  28  GLUCOSE 220*  --  150*  --  132*  BUN 28*  --  30*  --  29*  CREATININE 1.70*  --  1.77*  --  1.49*  CALCIUM 8.2*  --  8.1*  --  8.1*  MG 1.6*  --  2.6*  --   --   PHOS 3.7  --   --   --  2.3*   < > = values in this interval not displayed.    Liver Function Tests Recent Labs    02/09/22 0426 02/10/22 0402  AST 87* 44*  ALT 134* 86*  ALKPHOS 108 85  BILITOT 1.8* 1.3*  PROT 5.3* 4.7*  ALBUMIN 2.5* 2.1*    No results for input(s): LIPASE, AMYLASE in the last 72 hours. Cardiac Enzymes No results for input(s): CKTOTAL,  CKMB, CKMBINDEX, TROPONINI in the last 72 hours.  BNP: BNP (last 3 results) Recent Labs    07/02/21 1335 02/08/22 0351  BNP 1,157.2* 709.9*     ProBNP (last 3 results) No results for input(s): PROBNP in the last 8760 hours.   D-Dimer No results for input(s): DDIMER in the last 72 hours. Hemoglobin A1C Recent Labs    02/07/22 2131  HGBA1C 5.3    Fasting Lipid Panel No results for input(s): CHOL, HDL, LDLCALC, TRIG, CHOLHDL, LDLDIRECT in the last 72 hours. Thyroid Function Tests Recent Labs    02/07/22 2131  TSH 1.569     Other results:   Imaging    No results found.   Medications:     Scheduled Medications:  chlorhexidine gluconate (MEDLINE KIT)  15 mL Mouth Rinse BID   Chlorhexidine Gluconate Cloth  6 each Topical Daily   docusate  100 mg Per Tube BID   feeding supplement (PROSource TF)  45 mL Per Tube BID   feeding supplement (VITAL HIGH PROTEIN)  1,000 mL Per Tube Q24H   fentaNYL (SUBLIMAZE) injection  50 mcg Intravenous Once   mouth rinse  15 mL Mouth Rinse 10 times per day   pantoprazole sodium  40 mg Per Tube Daily   polyethylene glycol  17 g Per Tube  Daily   potassium chloride  20 mEq Per Tube Q4H    Infusions:  sodium chloride 20 mL/hr at 02/10/22 0600   amiodarone 30 mg/hr (02/10/22 0600)   fentaNYL infusion INTRAVENOUS 50 mcg/hr (02/10/22 0641)   heparin 1,000 Units/hr (02/10/22 0600)   midazolam 4 mg/hr (02/10/22 0600)   milrinone 0.25 mcg/kg/min (02/10/22 0600)   norepinephrine (LEVOPHED) Adult infusion 2 mcg/min (02/10/22 0600)   potassium chloride 10 mEq (02/10/22 0638)    PRN Medications: acetaminophen, fentaNYL, midazolam, ondansetron (ZOFRAN) IV    Patient Profile     56 y/o woman with severe MR and polysubstance abuse Lost to f/u. Admitted with AFL with RVR -> cardiogenic shock  Assessment/Plan   1. Shock, cardiogenic - in setting of severe MR and flash pulmonary edema. Lactic acid 2.6>1.9  - CO-OX  72% on Norepi 2 mcg + milrinone 0.25 mcg.  - Continue to wean norepi as able.   2. PEA Arrest -ACLS x1 min with ROSC -Intubated. Placed on pressors.   3.  Acute Hypoxic Respiratory Failure due to flash pulmonary edema -Urgent intubation 5/17 -improving  -FiO2 down to 50%.  -CVP 5 today. Holding diuretics.  - hopefull can extubate today   4.  Severe MR due to rheumatic heart disease - ECHO EF 65-70% Grade III DD, severe MR and rheumatic - TEE 5/18 EF 60-65% mild AI severe MR - MVR once recovered. R/L cath today (will plan to do while intubated) - Have notified TCTS   5 . PAF/SVT - A flutter on admit. Spontaneously converted to SR.  - Repeat DC-CV this am  - On amio  + heparin drip.    6.  AKI due to ATN/shock -Creatinine 0.96--->1.8 -> 1.49 today -Avoid hypotension.  -CVP 5. Holding diuretics.   7. Tobacco/Polysubstance abuse  - tox screen +cocaine/THC  8. Hypokalemia - supp - add spiro   9. Anxiety - severe   CRITICAL CARE Performed by: Glori Bickers  Total critical care time: 45 minutes  Critical care time was exclusive of separately billable procedures and treating other  patients.  Critical care was necessary to treat or prevent imminent or life-threatening deterioration.  Critical care  was time spent personally by me (independent of midlevel providers or residents) on the following activities: development of treatment plan with patient and/or surrogate as well as nursing, discussions with consultants, evaluation of patient's response to treatment, examination of patient, obtaining history from patient or surrogate, ordering and performing treatments and interventions, ordering and review of laboratory studies, ordering and review of radiographic studies, pulse oximetry and re-evaluation of patient's condition.   Length of Stay: [place], [place]  02/10/2022, 7:34 AM  Advanced Heart Failure Team Pager [phone] (M-F; 7a - 5p)  Please contact [facility] for night-coverage after hours (5p -7a ) and weekends on [email or web address]  Agree with above.  Remains intubated sedated. Down to 50% FiO2  On NE at 24 and milrinone 0.25. CVP 6. Off lasix. Co-ox 57%.    General:  Intubated. sedated HEENT: normal Neck: supple. RIJ TLC. Carotids 2+ bilat; no bruits. No lymphadenopathy or thryomegaly appreciated. Cor: PMI nondisplaced. Regular rate & rhythm. 2/6 MR + AI Lungs: clear Abdomen: soft, nontender, nondistended. No hepatosplenomegaly. No bruits or masses. Good bowel sounds. Extremities: no cyanosis, clubbing, rash, edema Neuro: intubated sedated  Remains tenuous but improved. Wean NE as tolerated. Will plan TEE today while intubated to evaluate MV. Hopefully can extubate tomorrow. Will need eventual R/L cath and MVR. D/w CCM. If difficult to wean pressors or vent will need swan.   CRITICAL CARE Performed by: Glori Bickers  Total critical care time: 45 minutes  Critical care time was exclusive of separately billable procedures and treating other patients.  Critical care was necessary to treat or prevent imminent or life-threatening  deterioration.  Critical care was time spent personally by me (independent of midlevel providers or residents) on the following activities: development of treatment plan with patient and/or surrogate as well as nursing, discussions with consultants, evaluation of patient's response to treatment, examination of patient, obtaining history from patient or surrogate, ordering and performing treatments and interventions, ordering and review of laboratory studies, ordering and review of radiographic studies, pulse oximetry and re-evaluation of patient's condition.  Glori Bickers, MD  7:34 AM

## 2022-02-10 NOTE — H&P (View-Only) (Signed)
Advanced Heart Failure Rounding Note  PCP-Cardiologist: Will Meredith Leeds, MD   Subjective:   5/17 Flash pulmonary edema. PEA arrest. Intubated. ACLS 1-2 min. ROSC. Placed on milrinone, norepi, and lasix drip.   Remains intubated/seated.   TEE 5/18 EF 60-65% severe MR. Mild AI    On NE 2 milrinone 0.25 Co-ox 72%  Renal function improving  Developed SVT at 200 this am. Emergent DC-CV   Objective:   Weight Range: 69.3 kg Body mass index is 23.93 kg/m.   Vital Signs:   Temp:  [97.9 F (36.6 C)-99.5 F (37.5 C)] 99.5 F (37.5 C) (05/19 0730) Pulse Rate:  [58-85] 71 (05/19 0630) Resp:  [10-24] 19 (05/19 0630) BP: (100-133)/(62-93) 102/62 (05/19 0600) SpO2:  [98 %-100 %] 99 % (05/19 0630) Arterial Line BP: (102-168)/(47-75) 125/52 (05/19 0630) FiO2 (%):  [40 %-50 %] 40 % (05/19 0438) Weight:  [69.3 kg] 69.3 kg (05/19 0409) Last BM Date :  (PTA)  Weight change: Filed Weights   02/09/22 0420 02/10/22 0409  Weight: 67.4 kg 69.3 kg    Intake/Output:   Intake/Output Summary (Last 24 hours) at 02/10/2022 0734 Last data filed at 02/10/2022 0600 Gross per 24 hour  Intake 2166.39 ml  Output 1295 ml  Net 871.39 ml       Physical Exam   General:  Intubated seated HEENT: normal + ETT Neck: supple. RIJ TLCCarotids 2+ bilat; no bruits. No lymphadenopathy or thryomegaly appreciated. Cor: PMI nondisplaced. Regular rate & rhythm. 2/6 MR Lungs: clear Abdomen: soft, nontender, nondistended. No hepatosplenomegaly. No bruits or masses. Good bowel sounds. Extremities: no cyanosis, clubbing, rash, edema Neuro: intubated/sedated   Telemetry   SR 70s Personally reviewed  Labs    CBC Recent Labs    02/07/22 1749 02/07/22 1812 02/09/22 0426 02/09/22 0956 02/10/22 0402  WBC 10.1   < > 15.8*  --  13.4*  NEUTROABS 6.9  --   --   --   --   HGB 13.7   < > 12.3 12.6 10.3*  HCT 42.2   < > 38.5 37.0 32.1*  MCV 89.4   < > 89.7  --  89.7  PLT 285   < > 263  --  221    < > = values in this interval not displayed.    Basic Metabolic Panel Recent Labs    02/08/22 1637 02/08/22 1833 02/09/22 0426 02/09/22 0956 02/10/22 0402  NA 139   < > 141 144 143  K 3.5   < > 3.7 3.7 3.1*  CL 104  --  104  --  110  CO2 25  --  27  --  28  GLUCOSE 220*  --  150*  --  132*  BUN 28*  --  30*  --  29*  CREATININE 1.70*  --  1.77*  --  1.49*  CALCIUM 8.2*  --  8.1*  --  8.1*  MG 1.6*  --  2.6*  --   --   PHOS 3.7  --   --   --  2.3*   < > = values in this interval not displayed.    Liver Function Tests Recent Labs    02/09/22 0426 02/10/22 0402  AST 87* 44*  ALT 134* 86*  ALKPHOS 108 85  BILITOT 1.8* 1.3*  PROT 5.3* 4.7*  ALBUMIN 2.5* 2.1*    No results for input(s): LIPASE, AMYLASE in the last 72 hours. Cardiac Enzymes No results for input(s): CKTOTAL,  CKMB, CKMBINDEX, TROPONINI in the last 72 hours.  BNP: BNP (last 3 results) Recent Labs    07/02/21 1335 02/08/22 0351  BNP 1,157.2* 709.9*     ProBNP (last 3 results) No results for input(s): PROBNP in the last 8760 hours.   D-Dimer No results for input(s): DDIMER in the last 72 hours. Hemoglobin A1C Recent Labs    02/07/22 2131  HGBA1C 5.3    Fasting Lipid Panel No results for input(s): CHOL, HDL, LDLCALC, TRIG, CHOLHDL, LDLDIRECT in the last 72 hours. Thyroid Function Tests Recent Labs    02/07/22 2131  TSH 1.569     Other results:   Imaging    No results found.   Medications:     Scheduled Medications:  chlorhexidine gluconate (MEDLINE KIT)  15 mL Mouth Rinse BID   Chlorhexidine Gluconate Cloth  6 each Topical Daily   docusate  100 mg Per Tube BID   feeding supplement (PROSource TF)  45 mL Per Tube BID   feeding supplement (VITAL HIGH PROTEIN)  1,000 mL Per Tube Q24H   fentaNYL (SUBLIMAZE) injection  50 mcg Intravenous Once   mouth rinse  15 mL Mouth Rinse 10 times per day   pantoprazole sodium  40 mg Per Tube Daily   polyethylene glycol  17 g Per Tube  Daily   potassium chloride  20 mEq Per Tube Q4H    Infusions:  sodium chloride 20 mL/hr at 02/10/22 0600   amiodarone 30 mg/hr (02/10/22 0600)   fentaNYL infusion INTRAVENOUS 50 mcg/hr (02/10/22 0641)   heparin 1,000 Units/hr (02/10/22 0600)   midazolam 4 mg/hr (02/10/22 0600)   milrinone 0.25 mcg/kg/min (02/10/22 0600)   norepinephrine (LEVOPHED) Adult infusion 2 mcg/min (02/10/22 0600)   potassium chloride 10 mEq (02/10/22 0638)    PRN Medications: acetaminophen, fentaNYL, midazolam, ondansetron (ZOFRAN) IV    Patient Profile     56 y/o woman with severe MR and polysubstance abuse Lost to f/u. Admitted with AFL with RVR -> cardiogenic shock  Assessment/Plan   1. Shock, cardiogenic - in setting of severe MR and flash pulmonary edema. Lactic acid 2.6>1.9  - CO-OX  72% on Norepi 2 mcg + milrinone 0.25 mcg.  - Continue to wean norepi as able.   2. PEA Arrest -ACLS x1 min with ROSC -Intubated. Placed on pressors.   3.  Acute Hypoxic Respiratory Failure due to flash pulmonary edema -Urgent intubation 5/17 -improving  -FiO2 down to 50%.  -CVP 5 today. Holding diuretics.  - hopefull can extubate today   4.  Severe MR due to rheumatic heart disease - ECHO EF 65-70% Grade III DD, severe MR and rheumatic - TEE 5/18 EF 60-65% mild AI severe MR - MVR once recovered. R/L cath today (will plan to do while intubated) - Have notified TCTS   5 . PAF/SVT - A flutter on admit. Spontaneously converted to SR.  - Repeat DC-CV this am  - On amio  + heparin drip.    6.  AKI due to ATN/shock -Creatinine 0.96--->1.8 -> 1.49 today -Avoid hypotension.  -CVP 5. Holding diuretics.   7. Tobacco/Polysubstance abuse  - tox screen +cocaine/THC  8. Hypokalemia - supp - add spiro   9. Anxiety - severe   CRITICAL CARE Performed by: Glori Bickers  Total critical care time: 45 minutes  Critical care time was exclusive of separately billable procedures and treating other  patients.  Critical care was necessary to treat or prevent imminent or life-threatening deterioration.  Critical care  was time spent personally by me (independent of midlevel providers or residents) on the following activities: development of treatment plan with patient and/or surrogate as well as nursing, discussions with consultants, evaluation of patient's response to treatment, examination of patient, obtaining history from patient or surrogate, ordering and performing treatments and interventions, ordering and review of laboratory studies, ordering and review of radiographic studies, pulse oximetry and re-evaluation of patient's condition.   Length of Stay: [place], [place]  02/10/2022, 7:34 AM  Advanced Heart Failure Team Pager [phone] (M-F; 7a - 5p)  Please contact [facility] for night-coverage after hours (5p -7a ) and weekends on [email or web address]  Agree with above.  Remains intubated sedated. Down to 50% FiO2  On NE at 24 and milrinone 0.25. CVP 6. Off lasix. Co-ox 57%.    General:  Intubated. sedated HEENT: normal Neck: supple. RIJ TLC. Carotids 2+ bilat; no bruits. No lymphadenopathy or thryomegaly appreciated. Cor: PMI nondisplaced. Regular rate & rhythm. 2/6 MR + AI Lungs: clear Abdomen: soft, nontender, nondistended. No hepatosplenomegaly. No bruits or masses. Good bowel sounds. Extremities: no cyanosis, clubbing, rash, edema Neuro: intubated sedated  Remains tenuous but improved. Wean NE as tolerated. Will plan TEE today while intubated to evaluate MV. Hopefully can extubate tomorrow. Will need eventual R/L cath and MVR. D/w CCM. If difficult to wean pressors or vent will need swan.   CRITICAL CARE Performed by: Glori Bickers  Total critical care time: 45 minutes  Critical care time was exclusive of separately billable procedures and treating other patients.  Critical care was necessary to treat or prevent imminent or life-threatening  deterioration.  Critical care was time spent personally by me (independent of midlevel providers or residents) on the following activities: development of treatment plan with patient and/or surrogate as well as nursing, discussions with consultants, evaluation of patient's response to treatment, examination of patient, obtaining history from patient or surrogate, ordering and performing treatments and interventions, ordering and review of laboratory studies, ordering and review of radiographic studies, pulse oximetry and re-evaluation of patient's condition.  Glori Bickers, MD  7:34 AM

## 2022-02-10 NOTE — Consult Note (Signed)
[address]       [place] [address]             [phone]        Lisa Ayala [facility] Medical Record #[id number] Date of Birth: December 02, 1965  Referring: Jolaine Artist, MD Primary Care: No PCP on file Primary Cardiologist: Will Meredith Leeds, MD  Reason for Consult: Evaluation for surgical management of severe mitral insufficiency 56 year old female presenting with symptomatic rapid atrial flutter progressing to decompensated heart failure and PEA arrest.  History of Present Illness:      Lisa Ayala. Lisa Ayala is a 56 year old female with a past history of SVT, anxiety, severe mitral insufficiency and moderate tricuspid regurgitation who presented to her cardiologist office on 02/07/2022 with a primary complaint of intermittent tachycardia with associated shortness of breath.  This started a few days after her birthday on 01/27/2023 when she had a few drinks which she said is not common for her. She was originally seen by Dr. Curt Bears in November 2022 in consult after she presented to the emergency room with SVT.  She was offered ablation for her wide-complex tachycardia but she preferred conservative therapy.  Dr. Curt Bears also recommended a transesophageal echocardiogram but she was unable to follow through with this study due to transportation issues.  Follow-up appointments with Dr.'s Burt Knack and [facility] were arranged but she did not show for these appointments. An EKG done at the cardiologist office on 51/6 showed an SVT suspicious for atrial flutter.  She was advised to report to the [facility] emergency room for further evaluation and management which she did.  In the ED, high-sensitivity troponin was 29, creatinine was 1.35, transaminases were mildly elevated.  Urine drug screen was positive for THC (which she admits to using) and positive for cocaine (which she denied using).  Lab work was otherwise unremarkable.  EKG showed atrial flutter with variable AV  block.  He was given a [place] times without version of her tachycardia.  She was admitted to the cardiology service by Dr. Gardiner Rhyme and was started on IV heparin.  In addition, she was given oral beta-blockers.  Further evaluation of her mitral valve was planned with transesophageal echo.  She continued to have atrial flutter off and on while in the ED.  On 02/08/2022, she was transported to the endoscopy suite for transesophageal echo.  However, before the study could be performed, she developed respiratory distress due to flash pulmonary edema and eventual PEA arrest.  She was treated with BR, epinephrine, and bicarbonate during the code and had return of spontaneous circulation.  She was given IV Lasix.  She was intubated emergently and return to the ICU.  Critical care medicine and advanced heart failure were consulted.  Aggressive diuresis continued and she was stabilized hemodynamically.  On 02/09/2022, sedation was withheld briefly and she awakened and responded appropriately.  She was resedated.  She went on to have transesophageal echo by Dr. Haroldine Laws on that date showing preserved ejection fraction of 60 to 65% with no regional wall motion abnormalities.  The right ventricle had mild hypokinesis.  The left atrium was massively dilated and there was mild aortic insufficiency.  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  Study was otherwise unremarkable.  Lisa Ayala has remained intubated and hemodynamically stable on low-dose norepinephrine and milrinone.  Co. ox was 72% this morning.  She had another episode of SVT this  morning with rates ranging from 170s to 200s.  DC cardioversion was performed with return of sinus rhythm.  She is now in a regular rate and rhythm on amiodarone.  Right and left heart catheterization was performed earlier today and results are pending. The coronary angiography was reviewed and shows no  obstructive disease.     Zubrod Score: At the time of surgery this patient's most appropriate activity status/level should be described as: []     0    Normal activity, no symptoms []     1    Restricted in physical strenuous activity but ambulatory, able to do out light work []     2    Ambulatory and capable of self care, unable to do work activities, up and about                 more than 50%  Of the time                            []     3    Only limited self care, in bed greater than 50% of waking hours [x]     4    Completely disabled, no self care, confined to bed or chair []     5    Moribund  Past Medical History:  Diagnosis Date   Aortic insufficiency    PSVT (paroxysmal supraventricular tachycardia) (HCC)    Severe mitral regurgitation    a. 08/2021 Echo: EF 70-75%, no rwma, mod dil LV, GrII DD, nl RV fxn, massively dil LA, mild-mod dil RA, severe MR due to lack of central coaptation of MV leaflets. Mild MS. Mod TR. Mild-mod AI.   Tricuspid regurgitation     History reviewed. No pertinent surgical history.  Social History   Tobacco Use  Smoking Status Never  Smokeless Tobacco Never    Social History   Substance and Sexual Activity  Alcohol Use Not Currently     No Known Allergies  Current Facility-Administered Medications  Medication Dose Route Frequency Provider Last Rate Last Admin   0.9 %  sodium chloride infusion   Intravenous Continuous Clegg, Amy D, NP 20 mL/hr at 02/10/22 1200 Infusion Verify at 02/10/22 1200   [START ON 02/13/2022] 0.9 %  sodium chloride infusion  250 mL Intravenous PRN Lyda Jester M, PA-C       [START ON 02/13/2022] 0.9 %  sodium chloride infusion   Intravenous Continuous Lyda Jester M, PA-C       [MAR Hold] acetaminophen (TYLENOL) tablet 650 mg  650 mg Oral Q4H PRN Theora Gianotti, NP       amiodarone (NEXTERONE PREMIX) 360-4.14 MG/200ML-% (1.8 mg/mL) IV infusion  60 mg/hr Intravenous Continuous Bensimhon, Shaune Pascal, MD  33.3 mL/hr at 02/10/22 1212 60 mg/hr at 02/10/22 1212   [START ON 02/13/2022] aspirin chewable tablet 81 mg  81 mg Per Tube Pre-Cath Simmons, Brittainy M, PA-C       [facility] Hold] chlorhexidine gluconate (MEDLINE KIT) (PERIDEX) 0.12 % solution 15 mL  15 mL Mouth Rinse BID Donato Heinz, MD   15 mL at 02/10/22 0729   [MAR Hold] Chlorhexidine Gluconate Cloth 2 % PADS 6 each  6 each Topical Daily Donato Heinz, MD   6 each at 02/10/22 1044   [MAR Hold] docusate (COLACE) 50 MG/5ML liquid 100 mg  100 mg Per Tube BID Corey Harold, NP   100 mg at 02/10/22 1006   [  MAR Hold] feeding supplement (PROSource TF) liquid 45 mL  45 mL Per Tube BID Jennelle Human B, NP   45 mL at 02/10/22 1006   [MAR Hold] feeding supplement (VITAL HIGH PROTEIN) liquid 1,000 mL  1,000 mL Per Tube Q24H Jennelle Human B, NP   1,000 mL at 02/09/22 1834   [MAR Hold] fentaNYL (SUBLIMAZE) bolus via infusion 50-100 mcg  50-100 mcg Intravenous Q15 min PRN Bensimhon, Shaune Pascal, MD   100 mcg at 02/10/22 1047   [MAR Hold] fentaNYL (SUBLIMAZE) injection 50 mcg  50 mcg Intravenous Once Bensimhon, Shaune Pascal, MD       fentaNYL 2534mg in NS 252m(1061mml) infusion-PREMIX  50-200 mcg/hr Intravenous Continuous Bensimhon, DanShaune PascalD 17.5 mL/hr at 02/10/22 1200 175 mcg/hr at 02/10/22 1200   heparin ADULT infusion 100 units/mL (25000 units/250m43m1,000 Units/hr Intravenous Continuous SchuDonato Heinz 10 mL/hr at 02/10/22 1200 1,000 Units/hr at 02/10/22 1200   [MAR Hold] MEDLINE mouth rinse  15 mL Mouth Rinse 10 times per day SchuDonato Heinz   15 mL at 02/10/22 1000   midazolam (VERSED) 100 mg/100 mL (1 mg/mL) premix infusion  0-10 mg/hr Intravenous Continuous Bensimhon, DaniShaune Pascal 8 mL/hr at 02/10/22 1200 8 mg/hr at 02/10/22 1200   [MAR Hold] midazolam (VERSED) bolus via infusion 0-5 mg  0-5 mg Intravenous Q1H PRN Bensimhon, DaniShaune Pascal   2 mg at 02/10/22 1047   milrinone (PRIMACOR) 20 MG/100 ML (0.2 mg/mL)  infusion  0.25 mcg/kg/min Intravenous Continuous Clegg, Amy D, NP 5.1 mL/hr at 02/10/22 1200 0.25 mcg/kg/min at 02/10/22 1200   [MAR Hold] norepinephrine (LEVOPHED) 16 mg in 250mL68mmix infusion  0-50 mcg/min Intravenous Titrated Bensimhon, DanieShaune Pascal7.5 mL/hr at 02/10/22 1200 8 mcg/min at 02/10/22 1200   [MAR Hold] ondansetron (ZOFRAN) injection 4 mg  4 mg Intravenous Q6H PRN BergeTheora Gianotti  4 mg at 02/07/22 2353   [MAR Hold] pantoprazole sodium (PROTONIX) 40 mg/20 mL oral suspension 40 mg  40 mg Per Tube Daily HoffmCorey Harold  40 mg at 02/10/22 1005   [MAR Hold] polyethylene glycol (MIRALAX / GLYCOLAX) packet 17 g  17 g Per Tube Daily HoffmCorey Harold  17 g at 02/10/22 1006   potassium PHOSPHATE 30 mmol in dextrose 5 % 500 mL infusion  30 mmol Intravenous Once WilsoLyndee Leo 85 mL/hr at 02/10/22 1200 Infusion Verify at 02/10/22 1200   [MAR Hold] sodium chloride flush (NS) 0.9 % injection 3 mL  3 mL Intravenous Q12H SimmoLyda JesterA-C       [START ON 02/13/2022] sodium chloride flush (NS) 0.9 % injection 3 mL  3 mL Intravenous PRN SimmoLyda JesterA-C       [MAR Hold] spironolactone (ALDACTONE) tablet 12.5 mg  12.5 mg Per Tube Daily Bensimhon, DanieShaune Pascal  12.5 mg at 02/10/22 1005    Medications Prior to Admission  Medication Sig Dispense Refill Last Dose   aspirin EC 81 MG tablet Take 81 mg by mouth daily. Swallow whole.   02/07/2022   metoprolol succinate (TOPROL-XL) 50 MG 24 hr tablet TAKE 1 TABLET BY MOUTH DAILY. TAKE WITH OR IMMEDIATELY FOLLOWING A MEAL. 90 tablet 3 02/06/2022 at 2400    Family History  Family history unknown: Yes     Review of Systems:   Review of Systems  Unable to perform ROS: Intubated  intubated  Cardiac Review of Systems: Y or  [    ]= no  Chest Pain [    ]  Resting SOB [   ] Exertional SOB  [  ]  Orthopnea [  ]   Pedal Edema [   ]    Palpitations [  ] Syncope  [  ]   Presyncope [   ]  General Review of  Systems: [Y] = yes [  ]=no Constitional: recent weight change [  ]; anorexia [  ]; fatigue [  ]; nausea [  ]; night sweats [  ]; fever [  ]; or chills [  ]                                                               Dental: Last Dentist visit:   Eye : blurred vision [  ]; diplopia [   ]; vision changes [  ];  Amaurosis fugax[  ]; Resp: cough [  ];  wheezing[  ];  hemoptysis[  ]; shortness of breath[  ]; paroxysmal nocturnal dyspnea[  ]; dyspnea on exertion[  ]; or orthopnea[  ];  GI:  gallstones[  ], vomiting[  ];  dysphagia[  ]; melena[  ];  hematochezia [  ]; heartburn[  ];   Hx of  Colonoscopy[  ]; GU: kidney stones [  ]; hematuria[  ];   dysuria [  ];  nocturia[  ];  history of     obstruction [  ]; urinary frequency [  ]             Skin: rash, swelling[  ];, hair loss[  ];  peripheral edema[  ];  or itching[  ]; Musculosketetal: myalgias[  ];  joint swelling[  ];  joint erythema[  ];  joint pain[  ];  back pain[  ];  Heme/Lymph: bruising[  ];  bleeding[  ];  anemia[  ];  Neuro: TIA[  ];  headaches[  ];  stroke[  ];  vertigo[  ];  seizures[  ];   paresthesias[  ];  difficulty walking[  ];  Psych:depression[  ]; anxiety[  ];  Endocrine: diabetes[  ];  thyroid dysfunction[  ];             -            -       Physical Exam: BP 109/65   Pulse 63   Temp 99.8 F (37.7 C) (Oral)   Resp 19   Wt 69.3 kg   SpO2 97%   BMI 23.93 kg/m   General appearance: intubated, sedated Cardiac tachycardic and irregular, + holosystolic murmur Lungs- rhonchi bilaterally Abdomen nondistended Poor dentition  Diagnostic Studies & Laboratory data:     Recent Radiology Findings:   DG Chest Port 1 View  Result Date: 02/08/2022 CLINICAL DATA:  Nasogastric tube placement and central line placement EXAM: PORTABLE CHEST 1 VIEW COMPARISON:  Earlier today at 2:41 p.m. FINDINGS: 3:57 p.m. Endotracheal tube terminates 2.2 cm above carina. Nasogastric tube extends beyond the inferior aspect of the film.  Right internal jugular line tip is favored to terminate at the high right atrium or cavoatrial junction. Numerous leads and wires project over the chest. Patient is rotated left. Mild cardiomegaly. Probable  small bilateral pleural effusions. No pneumothorax. Interstitial and airspace disease is symmetric, lower lung predominant, and progressive. IMPRESSION: Mild position degradation. Right internal jugular line tip is favored to terminate at the high right atrium or cavoatrial junction. No pneumothorax. Nasogastric tube extends beyond the inferior aspect of the film. Progressive interstitial and airspace disease, likely pulmonary edema. Electronically Signed   By: Abigail Miyamoto M.D.   On: 02/08/2022 16:17   DG CHEST PORT 1 VIEW  Result Date: 02/08/2022 CLINICAL DATA:  Shortness of breath EXAM: PORTABLE CHEST 1 VIEW COMPARISON:  02/07/2022 FINDINGS: Endotracheal tube is 3 cm above the carina. Right central line tip at the cavoatrial junction. No pneumothorax. Cardiomegaly. Diffuse bilateral interstitial and airspace opacities. This could reflect edema or infection. No visible effusions or pneumothorax. No acute bony abnormality. IMPRESSION: Cardiomegaly. Diffuse bilateral interstitial and alveolar opacities could reflect edema or infection. Electronically Signed   By: Rolm Baptise M.D.   On: 02/08/2022 15:12   DG Abd Portable 1V  Result Date: 02/08/2022 CLINICAL DATA:  NG tube placement EXAM: PORTABLE ABDOMEN - 1 VIEW COMPARISON:  06/18/2006 FINDINGS: Tip of enteric tube is seen in the antrum of the stomach. Bowel gas pattern is nonspecific. Pelvis is not included. Haziness in the lower lung fields suggest bilateral pleural effusions. Increased markings in the lower lung fields are noted, more so on the right side suggesting asymmetric pulmonary edema or multifocal pneumonia. IMPRESSION: Tip of enteric tube is seen in the antrum of the stomach. Electronically Signed   By: Elmer Picker M.D.   On:  02/08/2022 16:15   ECHOCARDIOGRAM COMPLETE  Result Date: 02/08/2022    ECHOCARDIOGRAM REPORT   Patient Name:   Lisa Ayala [facility] Date of Exam: 02/08/2022 Medical Rec #:  [id number]       Height:       67.0 in Accession #:    [id number]      Weight:       150.2 lb Date of Birth:  1966/08/22        BSA:          1.791 m Patient Age:    53 years        BP:           124/91 mmHg Patient Gender: F               HR:           69 bpm. Exam Location:  Inpatient Procedure: 2D Echo, Cardiac Doppler and Color Doppler                           STAT ECHO Reported to: Dr Buford Dresser on 02/08/2022 5:30:00 PM. Indications:    Mitral valve insufficiency  History:        Patient has prior history of Echocardiogram examinations, most                 recent 08/25/2021. CHF, Mitral Valve Disease; Arrythmias:Atrial                 Flutter.  Sonographer:    Clayton Lefort RDCS (AE) Referring Phys: [id number] [place]  Sonographer Comments: Echo performed with patient supine and on artificial respirator. IMPRESSIONS  1. Left ventricular ejection fraction, by estimation, is 65 to 70%. The left ventricle has normal function. The left ventricle has no regional wall motion abnormalities. There is moderate concentric left ventricular hypertrophy. Left ventricular diastolic parameters are consistent  with Grade III diastolic dysfunction (restrictive).  2. Right ventricular systolic function is mildly reduced. The right ventricular size is normal. There is mildly elevated pulmonary artery systolic pressure.  3. Left atrial size was severely dilated.  4. Right atrial size was mildly dilated.  5. A small pericardial effusion is present.  6. 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 Trivial mitral stenosis.  7. Tricuspid valve regurgitation is moderate.  8. The aortic valve is tricuspid. There is mild calcification of the aortic valve. Aortic valve regurgitation is mild.  9. The inferior vena cava is dilated in  size with <50% respiratory variability, suggesting right atrial pressure of 15 mmHg. Comparison(s): Prior images reviewed side by side. Conclusion(s)/Recommendation(s): No significant changes from prior--discussed with Dr. Gardiner Rhyme. Severe MR. Valves better seen on prior imaging. FINDINGS  Left Ventricle: Left ventricular ejection fraction, by estimation, is 65 to 70%. The left ventricle has normal function. The left ventricle has no regional wall motion abnormalities. The left ventricular internal cavity size was normal in size. There is  moderate concentric left ventricular hypertrophy. Left ventricular diastolic parameters are consistent with Grade III diastolic dysfunction (restrictive). Right Ventricle: The right ventricular size is normal. Right vetricular wall thickness was not well visualized. Right ventricular systolic function is mildly reduced. There is mildly elevated pulmonary artery systolic pressure. The tricuspid regurgitant velocity is 2.69 m/s, and with an assumed right atrial pressure of 15 mmHg, the estimated right ventricular systolic pressure is 71.2 mmHg. Left Atrium: Left atrial size was severely dilated. Right Atrium: Right atrial size was mildly dilated. Pericardium: A small pericardial effusion is present. Mitral Valve: The mitral valve is rheumatic. There is severe thickening of the mitral valve leaflet(s). Moderately decreased mobility of the mitral valve leaflets. Severe mitral valve regurgitation, with eccentric posteriorly directed jet. Trivial mitral  valve stenosis. MV peak gradient, 4.5 mmHg. The mean mitral valve gradient is 1.0 mmHg. Tricuspid Valve: The tricuspid valve is not well visualized. Tricuspid valve regurgitation is moderate. Aortic Valve: The aortic valve is tricuspid. There is mild calcification of the aortic valve. Aortic valve regurgitation is mild. Aortic valve mean gradient measures 4.0 mmHg. Aortic valve peak gradient measures 7.8 mmHg. Aortic valve area, by VTI  measures 2.10 cm. Pulmonic Valve: The pulmonic valve was not well visualized. Pulmonic valve regurgitation is trivial. No evidence of pulmonic stenosis. Aorta: The aortic arch was not well visualized and the aortic root and ascending aorta are structurally normal, with no evidence of dilitation. Venous: The inferior vena cava is dilated in size with less than 50% respiratory variability, suggesting right atrial pressure of 15 mmHg. IAS/Shunts: The atrial septum is grossly normal.  LEFT VENTRICLE PLAX 2D LVIDd:         3.60 cm     Diastology LVIDs:         2.20 cm     LV e' medial:    5.25 cm/s LV PW:         1.60 cm     LV E/e' medial:  20.0 LV IVS:        1.70 cm     LV e' lateral:   9.31 cm/s LVOT diam:     1.90 cm     LV E/e' lateral: 11.3 LV SV:         48 LV SV Index:   27 LVOT Area:     2.84 cm  LV Volumes (MOD) LV vol d, MOD A2C: 95.1 ml LV vol d,  MOD A4C: 90.8 ml LV vol s, MOD A2C: 41.9 ml LV vol s, MOD A4C: 44.1 ml LV SV MOD A2C:     53.2 ml LV SV MOD A4C:     90.8 ml LV SV MOD BP:      49.0 ml RIGHT VENTRICLE RV Basal diam:  3.30 cm RV S prime:     6.87 cm/s TAPSE (M-mode): 1.2 cm LEFT ATRIUM              Index        RIGHT ATRIUM           Index LA diam:        2.80 cm  1.56 cm/m   RA Area:     19.00 cm LA Vol (A2C):   171.0 ml 95.50 ml/m  RA Volume:   49.00 ml  27.37 ml/m LA Vol (A4C):   119.0 ml 66.46 ml/m LA Biplane Vol: 153.0 ml 85.45 ml/m  AORTIC VALVE AV Area (Vmax):    2.15 cm AV Area (Vmean):   2.05 cm AV Area (VTI):     2.10 cm AV Vmax:           140.00 cm/s AV Vmean:          89.000 cm/s AV VTI:            0.228 m AV Peak Grad:      7.8 mmHg AV Mean Grad:      4.0 mmHg LVOT Vmax:         106.00 cm/s LVOT Vmean:        64.500 cm/s LVOT VTI:          0.169 m LVOT/AV VTI ratio: 0.74  AORTA Ao Root diam: 2.90 cm Ao Asc diam:  3.40 cm MITRAL VALVE                  TRICUSPID VALVE MV Area (PHT): 1.55 cm       TR Peak grad:   28.9 mmHg MV Area VTI:   1.36 cm       TR Vmax:        269.00  cm/s MV Peak grad:  4.5 mmHg MV Mean grad:  1.0 mmHg       SHUNTS MV Vmax:       1.06 m/s       Systemic VTI:  0.17 m MV Vmean:      50.6 cm/s      Systemic Diam: 1.90 cm MV Decel Time: 488 msec MR Peak grad:    100.8 mmHg MR Mean grad:    65.0 mmHg MR Vmax:         502.00 cm/s MR Vmean:        378.0 cm/s MR PISA:         6.28 cm MR PISA Eff ROA: 48 mm MR PISA Radius:  1.00 cm MV E velocity: 105.00 cm/s MV A velocity: 28.30 cm/s MV E/A ratio:  3.71 Buford Dresser MD Electronically signed by Buford Dresser MD Signature Date/Time: 02/08/2022/5:55:39 PM    Final        TRANSESOPHAGEAL ECHOCARDIOGRAM (02/09/22)   NAME:  JODELL WEITMAN                            MRN:   [id number] DOB:  14-Jul-1966                   ADMIT DATE:  02/07/2022   INDICATIONS:   Mitral regurgitation   PROCEDURE:    Patient critically ill on vent. Consent on chart   After a procedural time-out, the patient was further sedated.   The transesophageal probe was inserted in the esophagus and stomach without difficulty and multiple views were obtained.      COMPLICATIONS:     There were no immediate complications.   FINDINGS:   LEFT VENTRICLE: EF = 60-65%. No regional wall motion abnormalities.  RIGHT VENTRICLE: Mild HK   LEFT ATRIUM: Massively dilated   LEFT ATRIAL APPENDAGE: No thrombus.    RIGHT ATRIUM: Normal   AORTIC VALVE:  Trileaflet.  Mild AI   MITRAL VALVE:    Rheumatic. Thickened. Posterior leaflet restricted. Severe ME   TRICUSPID VALVE: Normal. Mild TR   PULMONIC VALVE: Grossly normal.   INTERATRIAL SEPTUM: No PFO or ASD.   PERICARDIUM: Small anterior effusion.    DESCENDING AORTA: Mild plaque         Daniel Bensimhon,MD 12:35 PM   I have independently reviewed the above radiologic studies and discussed with the patient   Recent Lab Findings: Lab Results  Component Value Date   WBC 13.4 (H) 02/10/2022   HGB 10.3 (L) 02/10/2022   HCT 32.1 (L) 02/10/2022   PLT 221  02/10/2022   GLUCOSE 132 (H) 02/10/2022   ALT 86 (H) 02/10/2022   AST 44 (H) 02/10/2022   NA 143 02/10/2022   K 3.1 (L) 02/10/2022   CL 110 02/10/2022   CREATININE 1.49 (H) 02/10/2022   BUN 29 (H) 02/10/2022   CO2 28 02/10/2022   TSH 1.569 02/07/2022   INR 1.3 (H) 02/07/2022   HGBA1C 5.3 02/07/2022      Assessment / Plan:    -56 year old female with the above described past medical history presented with severe MR, atrial tachyarrhythmias, and massively dilated left atrium likely contributed to the arrhythmias.  She developed anxiety, respiratory distress and eventual PEA arrest just before planned transesophageal echo 2 days ago.  She remains intubated but is hemodynamically stable supported with norepinephrine and milrinone.  Is being followed by [facility] failure service as well as critical care medicine.  Right and left heart catheterization was done earlier today.  Results are pending. Management of her acute heart failure is ongoing.  She also has 3 chronic kidney disease at baseline with creatinine of 1.5 this morning prior to IV contrast for the left heart catheterization. We agree that mitral valve repair and possible replacement is indicated once her heart failure or respiratory status, and renal function are optimized for surgery. Dr. Roxan Hockey will review and discussion regarding timing of surgical will follow.     Antony Odea, PA-C  02/10/2022 12:30 PM  I have personally seen and examined Lisa Ayala.  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 Developed flash pulmonary edema. Today (5/20) has gone into atrial fib with RVR, cardioversion was unsuccessful.  Hopefully can wean from vent once rhythm stabilized.   Needs medical optimization before MVR.  Will need dental consult prior to surgery.  Revonda Standard Roxan Hockey, MD [facility] [phone]

## 2022-02-10 NOTE — Progress Notes (Signed)
.  [facility] ADULT ICU REPLACEMENT PROTOCOL   The patient does apply for the [facility] Adult ICU Electrolyte Replacment Protocol based on the criteria listed below:   1.Exclusion criteria: TCTS patients, ECMO patients, and Dialysis patients 2. Is GFR >/= 30 ml/min? Yes.    Patient's GFR today is 41 3. Is SCr </= 2? Yes.   Patient's SCr is 1.49 mg/dL 4. Did SCr increase >/= 0.5 in 24 hours? No. 5.Pt's weight >40kg  Yes.   6. Abnormal electrolyte(s): K+ 3.1  7. Electrolytes replaced per protocol 8.  Call MD STAT for K+ </= 2.5, Phos </= 1, or Mag </= 1 Physician:  Randie Heinz 02/10/2022 5:16 AM

## 2022-02-10 NOTE — Progress Notes (Signed)
NAME:  Lisa Ayala, MRN:  [id number], DOB:  April 22, 1966, LOS: 3 ADMISSION DATE:  02/07/2022, CONSULTATION DATE:  5/17 REFERRING MD:  Dr. Gardiner Rhyme, CHIEF COMPLAINT:  hypoxia   History of Present Illness:  56 year old female with PMH as below, which is significant for severe MR, moderate TR, and SVT. She had an episode of SVT in October of 22 and as part of her cardiology follow up she was noted to have murmur. Echo showed extensive valvular disease including wide open MR. She unfortunately missed her scheduled TEE. Presented to EP clinic 5/16 with complaints of palpitations and dyspnea. She was found to be in a rapid atrial rhythm and was sent to ED for further evaluation. In the ED she was worked up for SVT and had several rounds of adenosine given with some evidence of atrial flutter when rate slowed. She was started on heparin for flutter and was planned for TEE/DCCV. Prior to the case beginning on 5/17 she rapidly declined with progressive dyspnea, flash pulmonary edema, and hypoxia requiring intubation. Briefly suffered asystolic arrest during intubation. PCCM asked to evaluate for vent management.   Pertinent  Medical History   has a past medical history of Aortic insufficiency, PSVT (paroxysmal supraventricular tachycardia) (HCC), Severe mitral regurgitation, and Tricuspid regurgitation.  Significant Hospital Events: Including procedures, antibiotic start and stop dates in addition to other pertinent events   5/16 admit for A flutter 5/17 decompensated in endoscopy prior to TEE. Intubated. Brief asystolic arrest.  0000000 SVT requiring emergent cardioversion  Interim History / Subjective:  SVT early this morning with rates ~ 200. Treated with emergent cardioversion. TEE yesterday with LVEF 60-65%, massively dilated LA, RV mild HK. 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 Small anterior pericardial effusion. No PFO or ASD.   Objective   Blood pressure (!) 142/82, pulse 80, temperature 99.5 F  (37.5 C), temperature source Oral, resp. rate 19, weight 69.3 kg, SpO2 97 %. CVP:  [1 mmHg-13 mmHg] 1 mmHg  Vent Mode: PRVC FiO2 (%):  [40 %] 40 % Set Rate:  [19 bmp] 19 bmp Vt Set:  [490 mL] 490 mL PEEP:  [5 cmH20-8 cmH20] 5 cmH20 Pressure Support:  [10 cmH20] 10 cmH20 Plateau Pressure:  [9 cmH20-17 cmH20] 9 cmH20   Intake/Output Summary (Last 24 hours) at 02/10/2022 0947 Last data filed at 02/10/2022 0900 Gross per 24 hour  Intake 2527.07 ml  Output 1215 ml  Net 1312.07 ml    Filed Weights   02/09/22 0420 02/10/22 0409  Weight: 67.4 kg 69.3 kg    Examination: General:  critically ill appearing female in NAD HEENT: [place]/AT, PERRL Neuro: sedated CV: RRR, 3/6 systolic murmur PULM:  Clear bilateral breath sounds.  GI: Soft, non-distended.  Extremities: No significant edema.  Skin: Grossly intact.  TEE 5/19 with LVEF 60-65%, massively dilated LA, RV mild HK. 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 Small anterior pericardial effusion. No PFO or ASD.   Resolved Hospital Problem list     Assessment & Plan:   Acute hypoxemic respiratory failure secondary to flash pulmonary edema in the setting of severe MR.  P:  - Full vent support - VAP prevention - PAD protocol for sedation> fentanyl/ versed, RASS goal 0/-1 - Wean PEEP/ FiO2 as able for SpO2 >92%.  - Will try for SAT/SBT after cath this afternoon.   Cardiogenic shock Sever MR due to rheumatic heart dz Brief PEA arrest 5/17 - per HF - weaning NE as able for MAP  65 (32mcg this morning) - trending coox, 71% today - milrinone infusion, currently at 0.25 - spironolactone 12.5mg  daily - for RHC/LHC today.   Atrial flutter with RVR: CHA2DS2VASc score 1 - spontaneously converted to SR - continue amio gtt - heparin gtt per pharmacy  - goal K> 4, Mag > 2  SVT: rates as high as 200 S/p emergent DC-CV 5/19 - Continue telemetry monitoring - Amio ongoing  AKI - Creatinine somewhat better this morning at 1.49 - cont  hemodynamic support as above - trend renal indices/ UOP/ strict I/Os, daily wts   Substance abuse: THC and cocaine on UDS - Cessation counseling when appropriate.   Mild hyperglycemia - CBGs q 4, add SSI if > 180  Nutrition - TF  Best Practice (right click and "Reselect all SmartList Selections" daily)   Diet/type: NPO; TF DVT prophylaxis: systemic heparin GI prophylaxis: PPI Lines: Central line and Arterial Line Foley:  Yes, and it is still needed Code Status:  full code Last date of multidisciplinary goals of care discussion [ ]  per primary team.     Critical care time: 35 minutes     Georgann Housekeeper, AGACNP-BC [place] for personal pager PCCM on call pager [phone] until 7pm. Please call Elink 7p-7a. [phone]  02/10/2022 10:05 AM

## 2022-02-10 NOTE — Plan of Care (Signed)
  Problem: Nutrition: Goal: Adequate nutrition will be maintained Outcome: Progressing   Problem: Elimination: Goal: Will not experience complications related to bowel motility Outcome: Progressing Goal: Will not experience complications related to urinary retention Outcome: Progressing   Problem: Pain Managment: Goal: General experience of comfort will improve Outcome: Progressing   Problem: Skin Integrity: Goal: Risk for impaired skin integrity will decrease Outcome: Progressing   

## 2022-02-11 ENCOUNTER — Inpatient Hospital Stay (HOSPITAL_COMMUNITY): Payer: Medicaid Other

## 2022-02-11 DIAGNOSIS — Y95 Nosocomial condition: Secondary | ICD-10-CM

## 2022-02-11 DIAGNOSIS — J9601 Acute respiratory failure with hypoxia: Secondary | ICD-10-CM

## 2022-02-11 DIAGNOSIS — J189 Pneumonia, unspecified organism: Secondary | ICD-10-CM

## 2022-02-11 LAB — COMPREHENSIVE METABOLIC PANEL
ALT: 67 U/L — ABNORMAL HIGH (ref 0–44)
AST: 30 U/L (ref 15–41)
Albumin: 2.2 g/dL — ABNORMAL LOW (ref 3.5–5.0)
Alkaline Phosphatase: 127 U/L — ABNORMAL HIGH (ref 38–126)
Anion gap: 7 (ref 5–15)
BUN: 19 mg/dL (ref 6–20)
CO2: 28 mmol/L (ref 22–32)
Calcium: 8.2 mg/dL — ABNORMAL LOW (ref 8.9–10.3)
Chloride: 107 mmol/L (ref 98–111)
Creatinine, Ser: 1.34 mg/dL — ABNORMAL HIGH (ref 0.44–1.00)
GFR, Estimated: 46 mL/min — ABNORMAL LOW (ref >60)
Glucose, Bld: 174 mg/dL — ABNORMAL HIGH (ref 70–99)
Potassium: 3.5 mmol/L (ref 3.5–5.1)
Sodium: 142 mmol/L (ref 135–145)
Total Bilirubin: 1.3 mg/dL — ABNORMAL HIGH (ref 0.3–1.2)
Total Protein: 5.4 g/dL — ABNORMAL LOW (ref 6.5–8.1)

## 2022-02-11 LAB — MAGNESIUM
Magnesium: 1.9 mg/dL (ref 1.7–2.4)
Magnesium: 2.2 mg/dL (ref 1.7–2.4)

## 2022-02-11 LAB — BASIC METABOLIC PANEL
Anion gap: 9 (ref 5–15)
BUN: 16 mg/dL (ref 6–20)
CO2: 26 mmol/L (ref 22–32)
Calcium: 8 mg/dL — ABNORMAL LOW (ref 8.9–10.3)
Chloride: 106 mmol/L (ref 98–111)
Creatinine, Ser: 1.51 mg/dL — ABNORMAL HIGH (ref 0.44–1.00)
GFR, Estimated: 40 mL/min — ABNORMAL LOW (ref >60)
Glucose, Bld: 142 mg/dL — ABNORMAL HIGH (ref 70–99)
Potassium: 4.1 mmol/L (ref 3.5–5.1)
Sodium: 141 mmol/L (ref 135–145)

## 2022-02-11 LAB — GLUCOSE, CAPILLARY
Glucose-Capillary: 151 mg/dL — ABNORMAL HIGH (ref 70–99)
Glucose-Capillary: 133 mg/dL — ABNORMAL HIGH (ref 70–99)
Glucose-Capillary: 148 mg/dL — ABNORMAL HIGH (ref 70–99)
Glucose-Capillary: 133 mg/dL — ABNORMAL HIGH (ref 70–99)
Glucose-Capillary: 171 mg/dL — ABNORMAL HIGH (ref 70–99)
Glucose-Capillary: 156 mg/dL — ABNORMAL HIGH (ref 70–99)

## 2022-02-11 LAB — COOXEMETRY PANEL
Carboxyhemoglobin: 1.9 % — ABNORMAL HIGH (ref 0.5–1.5)
Methemoglobin: <0.7 % (ref 0.0–1.5)
O2 Saturation: 72 %
Total hemoglobin: 10.4 g/dL — ABNORMAL LOW (ref 12.0–16.0)

## 2022-02-11 LAB — PROCALCITONIN: Procalcitonin: 4.03 ng/mL

## 2022-02-11 LAB — CBC
HCT: 35.4 % — ABNORMAL LOW (ref 36.0–46.0)
Hemoglobin: 11 g/dL — ABNORMAL LOW (ref 12.0–15.0)
MCH: 28.3 pg (ref 26.0–34.0)
MCHC: 31.1 g/dL (ref 30.0–36.0)
MCV: 91 fL (ref 80.0–100.0)
Platelets: 233 10*3/uL (ref 150–400)
RBC: 3.89 MIL/uL (ref 3.87–5.11)
RDW: 18.3 % — ABNORMAL HIGH (ref 11.5–15.5)
WBC: 16.5 10*3/uL — ABNORMAL HIGH (ref 4.0–10.5)
nRBC: 0.1 % (ref 0.0–0.2)

## 2022-02-11 LAB — PHOSPHORUS
Phosphorus: 3 mg/dL (ref 2.5–4.6)
Phosphorus: 3.2 mg/dL (ref 2.5–4.6)

## 2022-02-11 LAB — HEPARIN LEVEL (UNFRACTIONATED)
Heparin Unfractionated: 0.11 IU/mL — ABNORMAL LOW (ref 0.30–0.70)
Heparin Unfractionated: 0.24 IU/mL — ABNORMAL LOW (ref 0.30–0.70)
Heparin Unfractionated: 0.61 IU/mL (ref 0.30–0.70)

## 2022-02-11 MED ORDER — AMIODARONE IV BOLUS ONLY 150 MG/100ML
150.0000 mg | Freq: Once | INTRAVENOUS | Status: AC
  Administered 2022-02-11: 150 mg via INTRAVENOUS

## 2022-02-11 MED ORDER — INSULIN ASPART 100 UNIT/ML IJ SOLN
2.0000 [IU] | INTRAMUSCULAR | Status: DC
  Administered 2022-02-11: 2 [IU] via SUBCUTANEOUS
  Administered 2022-02-11: 4 [IU] via SUBCUTANEOUS
  Administered 2022-02-11: 2 [IU] via SUBCUTANEOUS
  Administered 2022-02-12 (×5): 4 [IU] via SUBCUTANEOUS
  Administered 2022-02-12 – 2022-02-13 (×5): 2 [IU] via SUBCUTANEOUS
  Administered 2022-02-13: 4 [IU] via SUBCUTANEOUS
  Administered 2022-02-13 – 2022-02-14 (×3): 2 [IU] via SUBCUTANEOUS
  Administered 2022-02-14 (×3): 4 [IU] via SUBCUTANEOUS
  Administered 2022-02-14 – 2022-02-15 (×2): 2 [IU] via SUBCUTANEOUS
  Administered 2022-02-15: 4 [IU] via SUBCUTANEOUS

## 2022-02-11 MED ORDER — POTASSIUM CHLORIDE 20 MEQ PO PACK
40.0000 meq | PACK | Freq: Two times a day (BID) | ORAL | Status: DC
  Administered 2022-02-12 – 2022-02-13 (×4): 40 meq
  Filled 2022-02-11 (×5): qty 2

## 2022-02-11 MED ORDER — MAGNESIUM SULFATE 2 GM/50ML IV SOLN
2.0000 g | Freq: Once | INTRAVENOUS | Status: AC
  Administered 2022-02-11: 2 g via INTRAVENOUS
  Filled 2022-02-11: qty 50

## 2022-02-11 MED ORDER — AMIODARONE LOAD VIA INFUSION
150.0000 mg | INTRAVENOUS | Status: AC
  Administered 2022-02-11 (×2): 150 mg via INTRAVENOUS
  Filled 2022-02-11 (×2): qty 83.34

## 2022-02-11 MED ORDER — POTASSIUM CHLORIDE 10 MEQ/50ML IV SOLN
10.0000 meq | INTRAVENOUS | Status: AC
  Administered 2022-02-11 (×4): 10 meq via INTRAVENOUS
  Filled 2022-02-11: qty 50

## 2022-02-11 MED ORDER — ACETAMINOPHEN 160 MG/5ML PO SOLN
ORAL | Status: AC
  Administered 2022-02-11: 650 mg
  Filled 2022-02-11: qty 20.3

## 2022-02-11 MED ORDER — FUROSEMIDE 10 MG/ML IJ SOLN
80.0000 mg | Freq: Once | INTRAMUSCULAR | Status: AC
  Administered 2022-02-11: 80 mg via INTRAVENOUS
  Filled 2022-02-11: qty 8

## 2022-02-11 MED ORDER — POTASSIUM CHLORIDE 20 MEQ PO PACK
40.0000 meq | PACK | Freq: Once | ORAL | Status: AC
Start: 2022-02-11 — End: 2022-02-11
  Administered 2022-02-11: 40 meq
  Filled 2022-02-11: qty 2

## 2022-02-11 MED ORDER — MIDAZOLAM HCL 2 MG/2ML IJ SOLN: 1.0000 mg | Freq: Once | INTRAMUSCULAR | Status: AC

## 2022-02-11 MED ORDER — POTASSIUM CHLORIDE 20 MEQ PO PACK
40.0000 meq | PACK | Freq: Once | ORAL | Status: AC
  Administered 2022-02-11: 40 meq
  Filled 2022-02-11: qty 2

## 2022-02-11 MED ORDER — PIPERACILLIN-TAZOBACTAM 3.375 G IVPB
3.3750 g | Freq: Three times a day (TID) | INTRAVENOUS | Status: AC
  Administered 2022-02-11 – 2022-02-18 (×21): 3.375 g via INTRAVENOUS
  Filled 2022-02-11 (×21): qty 50

## 2022-02-11 MED ORDER — MIDAZOLAM HCL 2 MG/2ML IJ SOLN
INTRAMUSCULAR | Status: AC
  Administered 2022-02-11: 1 mg via INTRAVENOUS
  Filled 2022-02-11: qty 2

## 2022-02-11 MED ORDER — POTASSIUM CHLORIDE 20 MEQ PO PACK: 40.0000 meq | PACK | Freq: Two times a day (BID) | ORAL | Status: DC

## 2022-02-11 NOTE — Progress Notes (Signed)
eLink Physician-Brief Progress Note Patient Name: Lisa Ayala DOB: [birth year] MRN: [id number]   Date of Service  02/11/2022  HPI/Events of Note  Patient going into and out of AFIB with ventricular rate = 140-160.  eICU Interventions  Plan: Amiodarone 150 mg IV over 10 minutes now.      Intervention Category Major Interventions: Arrhythmia - evaluation and management  Kessler Solly Eugene 02/11/2022, 6:29 AM

## 2022-02-11 NOTE — Progress Notes (Signed)
ANTICOAGULATION CONSULT NOTE - Follow Up Consult  Pharmacy Consult for heparin gtt Indication: atrial fibrillation  No Known Allergies  Patient Measurements: Weight: 69.3 kg (152 lb 12.5 oz) Heparin Dosing Weight: 68.1 kg  Vital Signs: Temp: 100 F (37.8 C) (05/20 1200) Temp Source: Oral (05/20 1200) BP: 93/66 (05/20 1500) Pulse Rate: 96 (05/20 1536)  Labs: Recent Labs    02/08/22 1831 02/08/22 1833 02/08/22 2015 02/08/22 2215 02/08/22 2235 02/09/22 0426 02/09/22 0956 02/10/22 0402 02/10/22 1338 02/10/22 1346 02/10/22 1347 02/10/22 1634 02/11/22 0239 02/11/22 0300 02/11/22 0738 02/11/22 1501  HGB  --    < >  --   --    < > 12.3   < > 10.3*   < > 10.2* 10.2*  --  11.0*  --   --   --   HCT  --    < >  --   --    < > 38.5   < > 32.1*   < > 30.0* 30.0*  --  35.4*  --   --   --   PLT  --   --   --   --   --  263  --  221  --   --   --   --  233  --   --   --   HEPARINUNFRC  --   --   --   --   --  0.80*   < > 0.60  --   --   --   --   --  0.11* 0.24* 0.61  CREATININE  --   --   --   --   --  1.77*  --  1.49*  --   --   --  1.43* 1.34*  --   --  1.51*  TROPONINIHS 104*  --  155* 239*  --   --   --   --   --   --   --   --   --   --   --   --    < > = values in this interval not displayed.     Estimated Creatinine Clearance: 40 mL/min (A) (by C-G formula based on SCr of 1.51 mg/dL (H)).  Assessment: 25 yof with a history of SVT, mitral regurg TR, AI, anxiety. Patient is presenting with tachycardia. Heparin per pharmacy consult placed for atrial fibrillation. Patient is not on anticoagulation prior to arrival.  Heparin drip 1000 uts/hr with heparin level 0.22 < goal. No issues noted per chart review. In and out Afib on amiodarone > will incfrease drip to be in goal range   Goal of Therapy:  Heparin level 0.3-0.7 units/ml Monitor platelets by anticoagulation protocol: Yes   Plan:  Increase  heparin 1100 units/hr  Daily heparin level and CBC Monitor for s/sx of  bleeding    Bonnita Nasuti Pharm.D. CPP, BCPS Clinical Pharmacist [phone] 02/11/2022 3:49 PM    Please check [email or web address] for unit-specific pharmacy phone numbers.

## 2022-02-11 NOTE — Progress Notes (Signed)
[facility] Progress Note Patient Name: Lisa Ayala DOB: 03-Feb-1966 MRN: [id number]   Date of Service  02/11/2022  HPI/Events of Note  Hypokalemia  Hypomagnesemia - K+ = 3.5, Mg++ = 1.9 and Creatinine = 1.34.  eICU Interventions  Plan: Mg++ replaced by eLink protocol. Will replace K+.     Intervention Category Major Interventions: Electrolyte abnormality - evaluation and management  Seth Higginbotham Eugene 02/11/2022, 4:03 AM

## 2022-02-11 NOTE — Progress Notes (Signed)
NAME:  Lisa Ayala, MRN:  [id number], DOB:  Oct 29, 1965, LOS: 4 ADMISSION DATE:  02/07/2022, CONSULTATION DATE:  5/17 REFERRING MD:  Dr. Bjorn Pippin, CHIEF COMPLAINT:  hypoxia   History of Present Illness:  56 year old female with PMH as below, which is significant for severe MR, moderate TR, and SVT. She had an episode of SVT in October of 22 and as part of her cardiology follow up she was noted to have murmur. Echo showed extensive valvular disease including wide open MR. She unfortunately missed her scheduled TEE. Presented to EP clinic 5/16 with complaints of palpitations and dyspnea. She was found to be in a rapid atrial rhythm and was sent to ED for further evaluation. In the ED she was worked up for SVT and had several rounds of adenosine given with some evidence of atrial flutter when rate slowed. She was started on heparin for flutter and was planned for TEE/DCCV. Prior to the case beginning on 5/17 she rapidly declined with progressive dyspnea, flash pulmonary edema, and hypoxia requiring intubation. Briefly suffered asystolic arrest during intubation. PCCM asked to evaluate for vent management.   Pertinent  Medical History   has a past medical history of Aortic insufficiency, PSVT (paroxysmal supraventricular tachycardia) (HCC), Severe mitral regurgitation, and Tricuspid regurgitation.  Significant Hospital Events: Including procedures, antibiotic start and stop dates in addition to other pertinent events   5/16 admit for A flutter 5/17 decompensated in endoscopy prior to TEE. Intubated. Brief asystolic arrest.  5/19 SVT requiring emergent cardioversion  Interim History / Subjective:  This morning low grade fevers, increasing respiratory secretions Went into af with rvr and required repeat amiodarone bolus when sedation was held.  I did trial a PS trial and she was tachypnic and tachycardic.   Objective   Blood pressure (!) 133/113, pulse (!) 112, temperature (!) 100.5 F (38.1 C),  temperature source Axillary, resp. rate (!) 21, weight 69.3 kg, SpO2 99 %. CVP:  [7 mmHg] 7 mmHg  Vent Mode: PRVC FiO2 (%):  [40 %] 40 % Set Rate:  [19 bmp] 19 bmp Vt Set:  [490 mL] 490 mL PEEP:  [5 cmH20] 5 cmH20 Plateau Pressure:  [15 cmH20-18 cmH20] 17 cmH20   Intake/Output Summary (Last 24 hours) at 02/11/2022 1018 Last data filed at 02/11/2022 0900 Gross per 24 hour  Intake 3104.98 ml  Output 2170 ml  Net 934.98 ml   Filed Weights   02/09/22 0420 02/10/22 0409  Weight: 67.4 kg 69.3 kg    Examination: Gen:      Intubated, sedated, acutely ill appearing HEENT:  ETT to vent Lungs:    sounds of mechanical ventilation auscultated no wheezes CV:         tachycardic, irregular, systolic murmur rash. Abd:      +soft Ext:    No edema Skin:      Warm and dry; no rashes Neuro:   sedated, RASS -2  LHC/RHC numbers reviewed No significant coronary obstruction RV pressures not elevated substantially  Resolved Hospital Problem list     Assessment & Plan:   Acute hypoxemic respiratory failure secondary to flash pulmonary edema in the setting of severe MR.  Possible HAP P:  - Full vent support - VAP prevention - PAD protocol for sedation> fentanyl/ versed, RASS goal 0/-1 - Wean PEEP/ FiO2 as able for SpO2 >92%.  - given fevers and worsening secretions, blood and sputum cultures sent - PCT sent and elevated. Will start abx with zosyn - goal  is to try and extubate prior to definitive intervention for Mitral regurgitation  Cardiogenic shock Severe MR due to rheumatic heart dz Brief PEA arrest 5/17 - per HF - on milrinone and norepi - spironolactone 12.5mg  daily - diurese with IV lasix x 1 today  Atrial flutter with RVR: CHA2DS2VASc score 1 SVT: rates as high as 200 S/p emergent DC-CV 5/19 - spontaneously converted to SR but goes back to AF with RVR with stress such as SBT.  - continue amio gtt - heparin gtt per pharmacy  - goal K> 4, Mag > 2 - aggressively replete  electrolytes  AKI - Cr downtrending at 1.34 - cont hemodynamic support as above - trend renal indices/ UOP/ strict I/Os, daily wts   Substance abuse: THC and cocaine on UDS - Cessation counseling when appropriate.   Mild hyperglycemia - CBGs q 4, add SSI if > 180  Nutrition - TF  Best Practice (right click and "Reselect all SmartList Selections" daily)   Diet/type: NPO; TF DVT prophylaxis: systemic heparin GI prophylaxis: PPI Lines: Central line and Arterial Line Foley:  Yes, and it is still needed Code Status:  full code Last date of multidisciplinary goals of care discussion per primary team. I have updated daughter at bedside this morning.     Critical care time: 45 minutes    The patient is critically ill due to respiratory failure, cardiogenic shock.  Critical care was necessary to treat or prevent imminent or life-threatening deterioration.  Critical care was time spent personally by me on the following activities: development of treatment plan with patient and/or surrogate as well as nursing, discussions with consultants, evaluation of patient's response to treatment, examination of patient, obtaining history from patient or surrogate, ordering and performing treatments and interventions, ordering and review of laboratory studies, ordering and review of radiographic studies, pulse oximetry, re-evaluation of patient's condition and participation in multidisciplinary rounds.   Critical Care Time devoted to patient care services described in this note is 45 minutes. This time reflects time of care of this [place] . This critical care time does not reflect separately billable procedures or procedure time, teaching time or supervisory time of PA/NP/Med student/Med Resident etc but could involve care discussion time.       Leone [facility] 02/11/2022 10:24 AM  Pager: see AMION  If no response to pager , please call  critical care on call (see AMION) until 7pm After 7:00 pm call Elink

## 2022-02-11 NOTE — Progress Notes (Signed)
Patient ID: Lisa Ayala, female   DOB: 01/03/1966, 56 y.o.   MRN: [id number]     Advanced Heart Failure Rounding Note  PCP-Cardiologist: Will Martin Camnitz, MD   Subjective:    - 5/17 Flash pulmonary edema. PEA arrest. Intubated. ACLS 1-2 min. ROSC. Placed on milrinone, norepi, and lasix drip.  - 5/19 LHC/RHC: Nonobstructive CAD. RA 9, PA 50/10, mean PCWP 12, CI 6.8.  Rapid SVT, underwent emergent DCCV.   Remains intubated/sedated. Went back into rapid atrial fibrillation last night, HR 140s-160s currently.  Amiodarone increased to 60 mg/hr and on heparin gtt.  Co-ox 72% on milrinone 0.25 and NE 14.  CVP 9.   Tm 100.4 last night. CXR with bibasilar opacities.   Echo: 5/18 EF 60-65% severe MR. Mild AI     Objective:   Weight Range: 69.3 kg Body mass index is 23.93 kg/m.   Vital Signs:   Temp:  [99 F (37.2 C)-100.4 F (38 C)] 100.4 F (38 C) (05/19 2323) Pulse Rate:  [55-85] 55 (05/20 0600) Resp:  [17-37] 19 (05/20 0600) BP: (108-156)/(59-77) 119/62 (05/20 0600) SpO2:  [94 %-100 %] 94 % (05/20 0758) Arterial Line BP: (98-157)/(41-71) 120/46 (05/20 0600) FiO2 (%):  [40 %] 40 % (05/20 0758) Last BM Date :  (PTA)  Weight change: Filed Weights   02/09/22 0420 02/10/22 0409  Weight: 67.4 kg 69.3 kg    Intake/Output:   Intake/Output Summary (Last 24 hours) at 02/11/2022 0825 Last data filed at 02/11/2022 0600 Gross per 24 hour  Intake 2049.02 ml  Output 3145 ml  Net -1095.98 ml      Physical Exam   General: Intubated/sedated.  Neck: JVP 8 cm, no thyromegaly or thyroid nodule.  Lungs: Decreased BS at bases.  CV: Nondisplaced PMI.  Heart tachy, irregular S1/S2, ?S3 gallop, 2/6 HSM apex.  No peripheral edema.   Abdomen: Soft, nontender, no hepatosplenomegaly, no distention.  Skin: Intact without lesions or rashes.  Neurologic: Sedated on vent.  Extremities: No clubbing or cyanosis.  HEENT: Normal.   Telemetry   Atrial fibrillation rate 140s-160s. Personally  reviewed  Labs    CBC Recent Labs    02/10/22 0402 02/10/22 1338 02/10/22 1347 02/11/22 0239  WBC 13.4*  --   --  16.5*  HGB 10.3*   < > 10.2* 11.0*  HCT 32.1*   < > 30.0* 35.4*  MCV 89.7  --   --  91.0  PLT 221  --   --  233   < > = values in this interval not displayed.   Basic Metabolic Panel Recent Labs    02/10/22 1634 02/11/22 0239  NA 142 142  K 3.7 3.5  CL 106 107  CO2 29 28  GLUCOSE 144* 174*  BUN 24* 19  CREATININE 1.43* 1.34*  CALCIUM 7.8* 8.2*  MG 1.9 1.9  PHOS 4.1 3.0   Liver Function Tests Recent Labs    02/10/22 0402 02/11/22 0239  AST 44* 30  ALT 86* 67*  ALKPHOS 85 127*  BILITOT 1.3* 1.3*  PROT 4.7* 5.4*  ALBUMIN 2.1* 2.2*   No results for input(s): LIPASE, AMYLASE in the last 72 hours. Cardiac Enzymes No results for input(s): CKTOTAL, CKMB, CKMBINDEX, TROPONINI in the last 72 hours.  BNP: BNP (last 3 results) Recent Labs    07/02/21 1335 02/08/22 0351  BNP 1,157.2* 709.9*    ProBNP (last 3 results) No results for input(s): PROBNP in the last 8760 hours.   D-Dimer No results   for input(s): DDIMER in the last 72 hours. Hemoglobin A1C No results for input(s): HGBA1C in the last 72 hours. Fasting Lipid Panel No results for input(s): CHOL, HDL, LDLCALC, TRIG, CHOLHDL, LDLDIRECT in the last 72 hours. Thyroid Function Tests No results for input(s): TSH, T4TOTAL, T3FREE, THYROIDAB in the last 72 hours.  Invalid input(s): FREET3  Other results:   Imaging    CARDIAC CATHETERIZATION  Result Date: 02/10/2022   Ost RCA to Prox RCA lesion is 20% stenosed.   Prox RCA to Mid RCA lesion is 40% stenosed.   Mid LAD lesion is 40% stenosed.   The left ventricular ejection fraction is 55-65% by visual estimate. Findings: Ao = 139/74 LV = 136/12 RA = 9 RV = 46/10 PA = 50/10 (26) PCW = 12 (no significant v waves) Fick cardiac output/index = 12.2/6.8 PVR = 1.8 WU Ao sat = 99% PA sat = 79%, 80% SVC sat = 85% Assessment: 1. Mild non-obstructive  CAD 2. EF 60-65% 3. Severe MR by echo. No significant v-waves in PCWP tracing 4. High cardiac output - may be overestimated by Fick Plan/Discussion: Will plan to wean vent. TCTS consulted for MVR. Daniel Bensimhon, MD 4:27 PM  DG CHEST PORT 1 VIEW  Result Date: 02/11/2022 CLINICAL DATA:  56-year-old female intubated. Cardiogenic shock. EXAM: PORTABLE CHEST 1 VIEW COMPARISON:  Portable chest 02/08/2022 and earlier. FINDINGS: Portable AP semi upright view at 0734 hours. Stable right IJ central line. ETT tip in good position between the clavicles and carina. Enteric tube courses to the abdomen, tip not included. Pacer or resuscitation pads now project over the chest. Ongoing cardiomegaly, cardiac contour raising the possibility of pericardial effusion. Other mediastinal contours are within normal limits. No pneumothorax. Regressed but not resolved diffuse pulmonary interstitial opacity, apical pulmonary vascularity now appears normal. Continued veiling and confluent opacity at both lung bases. IMPRESSION: 1. Satisfactory visible lines and tubes. 2. Cardiomegaly, consider pericardial effusion. 3. Regressed but not resolved pulmonary interstitial opacity, favor regressed pulmonary edema. 4. Continued bibasilar opacity which probably reflects combination of pleural effusions and lower lobe collapse or consolidation. Electronically Signed   By: H  Hall M.D.   On: 02/11/2022 08:00     Medications:     Scheduled Medications:  chlorhexidine gluconate (MEDLINE KIT)  15 mL Mouth Rinse BID   Chlorhexidine Gluconate Cloth  6 each Topical Daily   docusate  100 mg Per Tube BID   fentaNYL (SUBLIMAZE) injection  50 mcg Intravenous Once   mouth rinse  15 mL Mouth Rinse 10 times per day   pantoprazole sodium  40 mg Per Tube Daily   polyethylene glycol  17 g Per Tube Daily   potassium chloride  20 mEq Oral BID   [START ON 02/13/2022] sodium chloride flush  3 mL Intravenous Q12H   spironolactone  12.5 mg Per Tube Daily     Infusions:  amiodarone 60 mg/hr (02/11/22 0804)   dexmedetomidine (PRECEDEX) IV infusion 0.6 mcg/kg/hr (02/11/22 0600)   feeding supplement (VITAL AF 1.2 CAL) 1,000 mL (02/10/22 1653)   fentaNYL infusion INTRAVENOUS 200 mcg/hr (02/11/22 0600)   heparin 1,000 Units/hr (02/11/22 0600)   milrinone 0.25 mcg/kg/min (02/10/22 1800)   norepinephrine (LEVOPHED) Adult infusion 6 mcg/min (02/11/22 0600)   potassium chloride 10 mEq (02/11/22 0741)    PRN Medications: acetaminophen, fentaNYL, midazolam, ondansetron (ZOFRAN) IV    Patient Profile     56 y/o woman with severe MR and polysubstance abuse Lost to f/u. Admitted with AFL with   RVR -> cardiogenic shock  Assessment/Plan   1. Shock, cardiogenic - in setting of severe MR and flash pulmonary edema. Lactic acid 2.6>1.9  - CO-OX 75% on Norepi 14 mcg + milrinone 0.25 mcg. With good co-ox and rapid AF, will decrease milrinone to 0.125.  - In rapid AF this morning with escalating NE dosing.  Suspect she will not tolerate this for long (see below).    2. PEA Arrest -ACLS x1 min with ROSC -Intubated. Placed on pressors.   3.  Acute Hypoxic Respiratory Failure due to flash pulmonary edema -Urgent intubation 5/17 -FiO2 down to 40%.  -CVP 9 today, would hold diuretics with AF/RVR, may need Lasix dose when back in NSR.    4.  Severe MR due to rheumatic heart disease - ECHO EF 65-70% Grade III DD, severe MR and rheumatic - TEE 5/18 EF 60-65% mild AI severe MR - MVR once recovered. Cath 5/19 showed mild nonobstructive CAD.  - Have notified TCTS   5 . PAF/SVT - A flutter on admit. Spontaneously converted to SR.  - Repeat DC-CV for rapid SVT 5/19.  - Last night, she went into AF with RVR, rate in the 140s-160s.   - Continue amiodarone 60 mg/hr and will bolus with 150 mg IV x 2.  If she does not convert in the next hour, will need DCCV. Do not think she will tolerate this high rate long, have had to increase NE.  - Continue heparin gtt.     6.  AKI due to ATN/shock -Creatinine 0.96--->1.8 -> 1.49 -> 1.34 today -Avoid hypotension.  -CVP 9. Holding diuretics while in AF with RVR.   7. Tobacco/Polysubstance abuse  - tox screen +cocaine/THC  8. Hypokalemia - supp  9. Anxiety - severe   10. ID - CXR with bibasilar opacities.  - Tm 100.4 overnight.  - Will send blood cultures and PCT.   CRITICAL CARE Performed by: Casimira Sutphin  Total critical care time: 40 minutes  Critical care time was exclusive of separately billable procedures and treating other patients.  Critical care was necessary to treat or prevent imminent or life-threatening deterioration.  Critical care was time spent personally by me (independent of midlevel providers or residents) on the following activities: development of treatment plan with patient and/or surrogate as well as nursing, discussions with consultants, evaluation of patient's response to treatment, examination of patient, obtaining history from patient or surrogate, ordering and performing treatments and interventions, ordering and review of laboratory studies, ordering and review of radiographic studies, pulse oximetry and re-evaluation of patient's condition.   Length of Stay: 4  Charisa Twitty, MD  02/11/2022, 8:25 AM  Advanced Heart Failure Team Pager [phone] (M-F; 7a - 5p)  Please contact [facility] for night-coverage after hours (5p -7a ) and weekends on [email or web address]  

## 2022-02-11 NOTE — Procedures (Signed)
Electrical Cardioversion Procedure Note Lisa Ayala [id number] 1966/08/12  Procedure: Electrical Cardioversion Indications:  Atrial Fibrillation  Procedure Details Consent: Risks of procedure as well as the alternatives and risks of each were explained to the (patient/caregiver).  Consent for procedure obtained. Consent obtained from daughter (patient is intubated) Time Out: Verified patient identification, verified procedure, site/side was marked, verified correct patient position, special equipment/implants available, medications/allergies/relevent history reviewed, required imaging and test results available.  Performed  Patient placed on cardiac monitor, pulse oximetry, supplemental oxygen as necessary.  Sedation given:  Fentanyl, Precedex, Versed Pacer pads placed anterior and posterior chest.  Cardioverted 3 times with sternal pressure.  Cardioverted at unsuccessful.  Evaluation Findings: Post procedure EKG shows: Atrial Fibrillation Complications: None Patient did tolerate procedure well.  Only transient NSR with cardioversions.  Will load further amiodarone and will see if we can cardiovert her tomorrow if she remains in AF with RVR.    Marca Ancona 02/11/2022, 11:13 AM

## 2022-02-11 NOTE — H&P (View-Only) (Signed)
Patient ID: Lisa Ayala, female   DOB: 04-27-1966, 56 y.o.   MRN: [id number]     Advanced Heart Failure Rounding Note  PCP-Cardiologist: Will Meredith Leeds, MD   Subjective:    - 5/17 Flash pulmonary edema. PEA arrest. Intubated. ACLS 1-2 min. ROSC. Placed on milrinone, norepi, and lasix drip.  - 5/19 LHC/RHC: Nonobstructive CAD. RA 9, PA 50/10, mean PCWP 12, CI 6.8.  Rapid SVT, underwent emergent DCCV.   Remains intubated/sedated. Went back into rapid atrial fibrillation last night, HR 140s-160s currently.  Amiodarone increased to 60 mg/hr and on heparin gtt.  Co-ox 72% on milrinone 0.25 and NE 14.  CVP 9.   Tm 100.4 last night. CXR with bibasilar opacities.   Echo: 5/18 EF 60-65% severe MR. Mild AI     Objective:   Weight Range: 69.3 kg Body mass index is 23.93 kg/m.   Vital Signs:   Temp:  [99 F (37.2 C)-100.4 F (38 C)] 100.4 F (38 C) (05/19 2323) Pulse Rate:  [55-85] 55 (05/20 0600) Resp:  [17-37] 19 (05/20 0600) BP: (108-156)/(59-77) 119/62 (05/20 0600) SpO2:  [94 %-100 %] 94 % (05/20 0758) Arterial Line BP: (98-157)/(41-71) 120/46 (05/20 0600) FiO2 (%):  [40 %] 40 % (05/20 0758) Last BM Date :  (PTA)  Weight change: Filed Weights   02/09/22 0420 02/10/22 0409  Weight: 67.4 kg 69.3 kg    Intake/Output:   Intake/Output Summary (Last 24 hours) at 02/11/2022 0825 Last data filed at 02/11/2022 0600 Gross per 24 hour  Intake 2049.02 ml  Output 3145 ml  Net -1095.98 ml      Physical Exam   General: Intubated/sedated.  Neck: JVP 8 cm, no thyromegaly or thyroid nodule.  Lungs: Decreased BS at bases.  CV: Nondisplaced PMI.  Heart tachy, irregular S1/S2, ?S3 gallop, 2/6 HSM apex.  No peripheral edema.   Abdomen: Soft, nontender, no hepatosplenomegaly, no distention.  Skin: Intact without lesions or rashes.  Neurologic: Sedated on vent.  Extremities: No clubbing or cyanosis.  HEENT: Normal.   Telemetry   Atrial fibrillation rate 140s-160s. Personally  reviewed  Labs    CBC Recent Labs    02/10/22 0402 02/10/22 1338 02/10/22 1347 02/11/22 0239  WBC 13.4*  --   --  16.5*  HGB 10.3*   < > 10.2* 11.0*  HCT 32.1*   < > 30.0* 35.4*  MCV 89.7  --   --  91.0  PLT 221  --   --  233   < > = values in this interval not displayed.   Basic Metabolic Panel Recent Labs    02/10/22 1634 02/11/22 0239  NA 142 142  K 3.7 3.5  CL 106 107  CO2 29 28  GLUCOSE 144* 174*  BUN 24* 19  CREATININE 1.43* 1.34*  CALCIUM 7.8* 8.2*  MG 1.9 1.9  PHOS 4.1 3.0   Liver Function Tests Recent Labs    02/10/22 0402 02/11/22 0239  AST 44* 30  ALT 86* 67*  ALKPHOS 85 127*  BILITOT 1.3* 1.3*  PROT 4.7* 5.4*  ALBUMIN 2.1* 2.2*   No results for input(s): LIPASE, AMYLASE in the last 72 hours. Cardiac Enzymes No results for input(s): CKTOTAL, CKMB, CKMBINDEX, TROPONINI in the last 72 hours.  BNP: BNP (last 3 results) Recent Labs    07/02/21 1335 02/08/22 0351  BNP 1,157.2* 709.9*    ProBNP (last 3 results) No results for input(s): PROBNP in the last 8760 hours.   D-Dimer No results  for input(s): DDIMER in the last 72 hours. Hemoglobin A1C No results for input(s): HGBA1C in the last 72 hours. Fasting Lipid Panel No results for input(s): CHOL, HDL, LDLCALC, TRIG, CHOLHDL, LDLDIRECT in the last 72 hours. Thyroid Function Tests No results for input(s): TSH, T4TOTAL, T3FREE, THYROIDAB in the last 72 hours.  Invalid input(s): FREET3  Other results:   Imaging    CARDIAC CATHETERIZATION  Result Date: 02/10/2022   Ost RCA to Prox RCA lesion is 20% stenosed.   Prox RCA to Mid RCA lesion is 40% stenosed.   Mid LAD lesion is 40% stenosed.   The left ventricular ejection fraction is 55-65% by visual estimate. Findings: Ao = 139/74 LV = 136/12 RA = 9 RV = 46/10 PA = 50/10 (26) PCW = 12 (no significant v waves) Fick cardiac output/index = 12.2/6.8 PVR = 1.8 WU Ao sat = 99% PA sat = 79%, 80% SVC sat = 85% Assessment: 1. Mild non-obstructive  CAD 2. EF 60-65% 3. Severe MR by echo. No significant v-waves in PCWP tracing 4. High cardiac output - may be overestimated by Fick Plan/Discussion: Will plan to wean vent. TCTS consulted for MVR. Glori Bickers, MD 4:27 PM  DG CHEST PORT 1 VIEW  Result Date: 02/11/2022 CLINICAL DATA:  56 year old female intubated. Cardiogenic shock. EXAM: PORTABLE CHEST 1 VIEW COMPARISON:  Portable chest 02/08/2022 and earlier. FINDINGS: Portable AP semi upright view at 0734 hours. Stable right IJ central line. ETT tip in good position between the clavicles and carina. Enteric tube courses to the abdomen, tip not included. Pacer or resuscitation pads now project over the chest. Ongoing cardiomegaly, cardiac contour raising the possibility of pericardial effusion. Other mediastinal contours are within normal limits. No pneumothorax. Regressed but not resolved diffuse pulmonary interstitial opacity, apical pulmonary vascularity now appears normal. Continued veiling and confluent opacity at both lung bases. IMPRESSION: 1. Satisfactory visible lines and tubes. 2. Cardiomegaly, consider pericardial effusion. 3. Regressed but not resolved pulmonary interstitial opacity, favor regressed pulmonary edema. 4. Continued bibasilar opacity which probably reflects combination of pleural effusions and lower lobe collapse or consolidation. Electronically Signed   By: Genevie Ann M.D.   On: 02/11/2022 08:00     Medications:     Scheduled Medications:  chlorhexidine gluconate (MEDLINE KIT)  15 mL Mouth Rinse BID   Chlorhexidine Gluconate Cloth  6 each Topical Daily   docusate  100 mg Per Tube BID   fentaNYL (SUBLIMAZE) injection  50 mcg Intravenous Once   mouth rinse  15 mL Mouth Rinse 10 times per day   pantoprazole sodium  40 mg Per Tube Daily   polyethylene glycol  17 g Per Tube Daily   potassium chloride  20 mEq Oral BID   [START ON 02/13/2022] sodium chloride flush  3 mL Intravenous Q12H   spironolactone  12.5 mg Per Tube Daily     Infusions:  amiodarone 60 mg/hr (02/11/22 0804)   dexmedetomidine (PRECEDEX) IV infusion 0.6 mcg/kg/hr (02/11/22 0600)   feeding supplement (VITAL AF 1.2 CAL) 1,000 mL (02/10/22 1653)   fentaNYL infusion INTRAVENOUS 200 mcg/hr (02/11/22 0600)   heparin 1,000 Units/hr (02/11/22 0600)   milrinone 0.25 mcg/kg/min (02/10/22 1800)   norepinephrine (LEVOPHED) Adult infusion 6 mcg/min (02/11/22 0600)   potassium chloride 10 mEq (02/11/22 0741)    PRN Medications: acetaminophen, fentaNYL, midazolam, ondansetron (ZOFRAN) IV    Patient Profile     56 y/o woman with severe MR and polysubstance abuse Lost to f/u. Admitted with AFL with  RVR -> cardiogenic shock  Assessment/Plan   1. Shock, cardiogenic - in setting of severe MR and flash pulmonary edema. Lactic acid 2.6>1.9  - CO-OX 75% on Norepi 14 mcg + milrinone 0.25 mcg. With good co-ox and rapid AF, will decrease milrinone to 0.125.  - In rapid AF this morning with escalating NE dosing.  Suspect she will not tolerate this for long (see below).    2. PEA Arrest -ACLS x1 min with ROSC -Intubated. Placed on pressors.   3.  Acute Hypoxic Respiratory Failure due to flash pulmonary edema -Urgent intubation 5/17 -FiO2 down to 40%.  -CVP 9 today, would hold diuretics with AF/RVR, may need Lasix dose when back in NSR.    4.  Severe MR due to rheumatic heart disease - ECHO EF 65-70% Grade III DD, severe MR and rheumatic - TEE 5/18 EF 60-65% mild AI severe MR - MVR once recovered. Cath 5/19 showed mild nonobstructive CAD.  - Have notified TCTS   5 . PAF/SVT - A flutter on admit. Spontaneously converted to SR.  - Repeat DC-CV for rapid SVT 5/19.  - Last night, she went into AF with RVR, rate in the 140s-160s.   - Continue amiodarone 60 mg/hr and will bolus with 150 mg IV x 2.  If she does not convert in the next hour, will need DCCV. Do not think she will tolerate this high rate long, have had to increase NE.  - Continue heparin gtt.     6.  AKI due to ATN/shock -Creatinine 0.96--->1.8 -> 1.49 -> 1.34 today -Avoid hypotension.  -CVP 9. Holding diuretics while in AF with RVR.   7. Tobacco/Polysubstance abuse  - tox screen +cocaine/THC  8. Hypokalemia - supp  9. Anxiety - severe   10. ID - CXR with bibasilar opacities.  - Tm 100.4 overnight.  - Will send blood cultures and PCT.   CRITICAL CARE Performed by: Loralie Champagne  Total critical care time: 40 minutes  Critical care time was exclusive of separately billable procedures and treating other patients.  Critical care was necessary to treat or prevent imminent or life-threatening deterioration.  Critical care was time spent personally by me (independent of midlevel providers or residents) on the following activities: development of treatment plan with patient and/or surrogate as well as nursing, discussions with consultants, evaluation of patient's response to treatment, examination of patient, obtaining history from patient or surrogate, ordering and performing treatments and interventions, ordering and review of laboratory studies, ordering and review of radiographic studies, pulse oximetry and re-evaluation of patient's condition.   Length of Stay: [place], MD  02/11/2022, 8:25 AM  Advanced Heart Failure Team Pager [phone] (M-F; 7a - 5p)  Please contact [facility] for night-coverage after hours (5p -7a ) and weekends on [email or web address]

## 2022-02-11 NOTE — Progress Notes (Signed)
eLink Physician-Brief Progress Note Patient Name: Lisa Ayala DOB: 1966/05/10 MRN: [id number]   Date of Service  02/11/2022  HPI/Events of Note  Patient back in AFIB with RVR - Ventricular rate = 154. BP = 139/72.  eICU Interventions  Plan: Amiodarone 150 mg IV over 10 minutes now. Bedside nurse to send AM labs now.      Intervention Category Major Interventions: Arrhythmia - evaluation and management  Natalynn Pedone Eugene 02/11/2022, 2:27 AM

## 2022-02-11 NOTE — Interval H&P Note (Signed)
History and Physical Interval Note:  02/11/2022 11:07 AM  Lisa Ayala  has presented today for surgery, with the diagnosis of heart failure, severe mr.  The various methods of treatment have been discussed with the patient and family. After consideration of risks, benefits and other options for treatment, the patient has consented to  Procedure(s): RIGHT/LEFT HEART CATH AND CORONARY ANGIOGRAPHY (N/A) as a surgical intervention.  The patient's history has been reviewed, patient examined, no change in status, stable for surgery.  I have reviewed the patient's chart and labs.  Questions were answered to the patient's satisfaction.     Lynard Postlewait [employer]

## 2022-02-11 NOTE — Progress Notes (Signed)
Mag 1.9 Replaced per protocol 

## 2022-02-12 ENCOUNTER — Inpatient Hospital Stay (HOSPITAL_COMMUNITY): Payer: Medicaid Other

## 2022-02-12 LAB — CBC
HCT: 36.4 % (ref 36.0–46.0)
Hemoglobin: 11.2 g/dL — ABNORMAL LOW (ref 12.0–15.0)
MCH: 28 pg (ref 26.0–34.0)
MCHC: 30.8 g/dL (ref 30.0–36.0)
MCV: 91 fL (ref 80.0–100.0)
Platelets: 247 10*3/uL (ref 150–400)
RBC: 4 MIL/uL (ref 3.87–5.11)
RDW: 18.1 % — ABNORMAL HIGH (ref 11.5–15.5)
WBC: 16.9 10*3/uL — ABNORMAL HIGH (ref 4.0–10.5)
nRBC: 0 % (ref 0.0–0.2)

## 2022-02-12 LAB — GLUCOSE, CAPILLARY
Glucose-Capillary: 150 mg/dL — ABNORMAL HIGH (ref 70–99)
Glucose-Capillary: 153 mg/dL — ABNORMAL HIGH (ref 70–99)
Glucose-Capillary: 161 mg/dL — ABNORMAL HIGH (ref 70–99)
Glucose-Capillary: 164 mg/dL — ABNORMAL HIGH (ref 70–99)
Glucose-Capillary: 168 mg/dL — ABNORMAL HIGH (ref 70–99)

## 2022-02-12 LAB — COOXEMETRY PANEL
Carboxyhemoglobin: 2 % — ABNORMAL HIGH (ref 0.5–1.5)
Methemoglobin: <0.7 % (ref 0.0–1.5)
O2 Saturation: 69 %
Total hemoglobin: 11.6 g/dL — ABNORMAL LOW (ref 12.0–16.0)

## 2022-02-12 LAB — BASIC METABOLIC PANEL
Anion gap: 6 (ref 5–15)
BUN: 22 mg/dL — ABNORMAL HIGH (ref 6–20)
CO2: 27 mmol/L (ref 22–32)
Calcium: 7.7 mg/dL — ABNORMAL LOW (ref 8.9–10.3)
Chloride: 108 mmol/L (ref 98–111)
Creatinine, Ser: 1.64 mg/dL — ABNORMAL HIGH (ref 0.44–1.00)
GFR, Estimated: 36 mL/min — ABNORMAL LOW (ref >60)
Glucose, Bld: 185 mg/dL — ABNORMAL HIGH (ref 70–99)
Potassium: 4.5 mmol/L (ref 3.5–5.1)
Sodium: 141 mmol/L (ref 135–145)

## 2022-02-12 LAB — MAGNESIUM: Magnesium: 2.2 mg/dL (ref 1.7–2.4)

## 2022-02-12 LAB — HEPARIN LEVEL (UNFRACTIONATED): Heparin Unfractionated: 0.58 IU/mL (ref 0.30–0.70)

## 2022-02-12 MED ORDER — FUROSEMIDE 10 MG/ML IJ SOLN
80.0000 mg | Freq: Once | INTRAMUSCULAR | Status: AC
  Administered 2022-02-12: 80 mg via INTRAVENOUS
  Filled 2022-02-12: qty 8

## 2022-02-12 NOTE — Progress Notes (Signed)
NAME:  Lisa Ayala, MRN:  [id number], DOB:  [birth year], LOS: 5 ADMISSION DATE:  02/07/2022, CONSULTATION DATE:  5/17 REFERRING MD:  Dr. Gardiner Rhyme, CHIEF COMPLAINT:  hypoxia   History of Present Illness:  56 year old female with PMH as below, which is significant for severe MR, moderate TR, and SVT. She had an episode of SVT in October of 22 and as part of her cardiology follow up she was noted to have murmur. Echo showed extensive valvular disease including wide open MR. She unfortunately missed her scheduled TEE. Presented to EP clinic 5/16 with complaints of palpitations and dyspnea. She was found to be in a rapid atrial rhythm and was sent to ED for further evaluation. In the ED she was worked up for SVT and had several rounds of adenosine given with some evidence of atrial flutter when rate slowed. She was started on heparin for flutter and was planned for TEE/DCCV. Prior to the case beginning on 5/17 she rapidly declined with progressive dyspnea, flash pulmonary edema, and hypoxia requiring intubation. Briefly suffered asystolic arrest during intubation. PCCM asked to evaluate for vent management.   Pertinent  Medical History   has a past medical history of Aortic insufficiency, PSVT (paroxysmal supraventricular tachycardia) (HCC), Severe mitral regurgitation, and Tricuspid regurgitation.  Significant Hospital Events: Including procedures, antibiotic start and stop dates in addition to other pertinent events   5/16 admit for A flutter 5/17 decompensated in endoscopy prior to TEE. Intubated. Brief asystolic arrest.  0000000 SVT requiring emergent cardioversion  Interim History / Subjective:  Was briefly in sinus rhythm this morning spontaneously converted. Now back in AF with RVR Fevered last night  and got tylenol Objective   Blood pressure (!) 139/96, pulse 90, temperature 99.4 F (37.4 C), temperature source Oral, resp. rate 19, weight 76.4 kg, SpO2 100 %. CVP:  [8 mmHg] 8 mmHg  Vent  Mode: PRVC FiO2 (%):  [40 %] 40 % Set Rate:  [19 bmp] 19 bmp Vt Set:  [490 mL] 490 mL PEEP:  [5 cmH20] 5 cmH20 Plateau Pressure:  [15 [id number] cmH20] 16 cmH20   Intake/Output Summary (Last 24 hours) at 02/12/2022 0948 Last data filed at 02/12/2022 0900 Gross per 24 hour  Intake 3749.86 ml  Output 2900 ml  Net 849.86 ml   Filed Weights   02/09/22 0420 02/10/22 0409 02/12/22 0500  Weight: 67.4 kg 69.3 kg 76.4 kg    Examination: Gen:      Intubated, sedated, acutely ill appearing HEENT:  ETT to vent Lungs:    sounds of mechanical ventilation auscultated no wheezes CV:         irregularly irregular, HR in the 90s Abd:      + bowel sounds; soft, non-tender; no palpable masses, no distension Ext:    No edema Skin:      Warm and dry; no rashes Neuro:   sedated, RASS -2   Diuresed 3L but still net positive. Getting a lt of volume rom sedation meds, amiodarone.  Coox 69 Na 141 Cr 1.64 WBC 16.9 Hgb 11.2 CBGs wnl  Sputum culture reincubated for better growth Blood cultures no growth to date.   Resolved Hospital Problem list     Assessment & Plan:   Acute hypoxemic respiratory failure secondary to flash pulmonary edema in the setting of severe MR.  Possible HAP P:  - Full vent support - VAP prevention - PAD protocol for sedation> fentanyl/precedex, RASS goal 0/-1 - Wean PEEP/ FiO2 as able for  SpO2 >92%.  - empiric zosyn for possible HAP, cultures pending - goal is to try and extubate prior to definitive intervention for Mitral regurgitation  Cardiogenic vs septic shock Severe MR due to rheumatic heart dz Brief PEA arrest 5/17 - per HF - on milrinone and norepi - spironolactone 12.5mg  daily - already received 90 mg lasix x1 today.   Atrial flutter with RVR: CHA2DS2VASc score 1 SVT: rates as high as 200 S/p emergent DC-CV 5/19 - spontaneously converted to SR but goes back to AF with RVR with stress such as SBT.  - continue amio gtt - heparin gtt per pharmacy  -  goal K> 4, Mag > 2 - aggressively replete electrolytes  AKI - cont hemodynamic support as above - trend renal indices/ UOP/ strict I/Os, daily wts  - uop has been good  Substance abuse: THC and cocaine on UDS - Cessation counseling when appropriate.   Mild hyperglycemia - CBGs q 4, add SSI if > 180  Nutrition - TF  Best Practice (right click and "Reselect all SmartList Selections" daily)   Diet/type: NPO; TF DVT prophylaxis: systemic heparin GI prophylaxis: PPI Lines: Central line and Arterial Line Foley:  Yes, and it is still needed Code Status:  full code Last date of multidisciplinary goals of care discussion per primary team. I have updated daughter at bedside this morning.     Critical care time: 39 minutes    The patient is critically ill due to respiratory failure, cardiogenic shock.  Critical care was necessary to treat or prevent imminent or life-threatening deterioration.  Critical care was time spent personally by me on the following activities: development of treatment plan with patient and/or surrogate as well as nursing, discussions with consultants, evaluation of patient's response to treatment, examination of patient, obtaining history from patient or surrogate, ordering and performing treatments and interventions, ordering and review of laboratory studies, ordering and review of radiographic studies, pulse oximetry, re-evaluation of patient's condition and participation in multidisciplinary rounds.   Critical Care Time devoted to patient care services described in this note is 39 minutes. This time reflects time of care of this [place] . This critical care time does not reflect separately billable procedures or procedure time, teaching time or supervisory time of PA/NP/Med student/Med Resident etc but could involve care discussion time.       Spero Geralds [facility] 02/12/2022 9:48 AM  Pager: see AMION  If no  response to pager , please call critical care on call (see AMION) until 7pm After 7:00 pm call Elink

## 2022-02-12 NOTE — Progress Notes (Signed)
ANTICOAGULATION CONSULT NOTE - Follow Up Consult  Pharmacy Consult for heparin gtt Indication: atrial fibrillation  No Known Allergies  Patient Measurements: Weight: 76.4 kg (168 lb 6.9 oz) Heparin Dosing Weight: 68.1 kg  Vital Signs: Temp: 99.4 F (37.4 C) (05/21 0747) Temp Source: Oral (05/21 0747) BP: 118/79 (05/20 2200) Pulse Rate: 87 (05/21 0600)  Labs: Recent Labs    02/10/22 0402 02/10/22 1338 02/10/22 1347 02/10/22 1634 02/11/22 0239 02/11/22 0300 02/11/22 0738 02/11/22 1501 02/12/22 0339  HGB 10.3*   < > 10.2*  --  11.0*  --   --   --  11.2*  HCT 32.1*   < > 30.0*  --  35.4*  --   --   --  36.4  PLT 221  --   --   --  233  --   --   --  247  HEPARINUNFRC 0.60  --   --   --   --    < > 0.24* 0.61 0.58  CREATININE 1.49*  --   --    < > 1.34*  --   --  1.51* 1.64*   < > = values in this interval not displayed.     Estimated Creatinine Clearance: 40.3 mL/min (A) (by C-G formula based on SCr of 1.64 mg/dL (H)).  Assessment: 68 yof with a history of SVT, mitral regurg TR, AI, anxiety. Patient is presenting with tachycardia. Heparin per pharmacy consult placed for atrial fibrillation. Patient is not on anticoagulation prior to arrival.  Heparin drip 1100 uts/hr with heparin level 0.58 at top of goal. No issues noted per chart review. In and out Afib on amiodarone  Cbc stable  Goal of Therapy:  Heparin level 0.3-0.7 units/ml Monitor platelets by anticoagulation protocol: Yes   Plan:  Decrease heparin 1000 units/hr  Daily heparin level and CBC Monitor for s/sx of bleeding    Bonnita Nasuti Pharm.D. CPP, BCPS Clinical Pharmacist [phone] 02/12/2022 8:10 AM    Please check [email or web address] for unit-specific pharmacy phone numbers.

## 2022-02-12 NOTE — Progress Notes (Signed)
2 Days Post-Op Procedure(s) (LRB): RIGHT/LEFT HEART CATH AND CORONARY ANGIOGRAPHY (N/A) Subjective: Intubated, sedated  Objective: Vital signs in last 24 hours: Temp:  [98.6 F (37 C)-102.5 F (39.2 C)] 99.4 F (37.4 C) (05/21 0747) Pulse Rate:  [76-122] 122 (05/21 0800) Cardiac Rhythm: Sinus tachycardia;Atrial fibrillation (05/21 0800) Resp:  [19-24] 20 (05/21 0800) BP: (91-155)/(63-100) 155/100 (05/21 0800) SpO2:  [98 %-100 %] 100 % (05/21 0800) Arterial Line BP: (90-178)/(52-87) 178/87 (05/21 0800) FiO2 (%):  [40 %] 40 % (05/21 0758) Weight:  [76.4 kg] 76.4 kg (05/21 0500)  Hemodynamic parameters for last 24 hours: CVP:  [8 mmHg] 8 mmHg  Intake/Output from previous day: 05/20 0701 - 05/21 0700 In: 4434.2 [I.V.:2471.2; BW/IO:0355; IV Piggyback:310.1] Out: 2900 [Urine:2900] Intake/Output this shift: Total I/O In: 487.6 [I.V.:195.7; NG/GT:267; IV Piggyback:24.9] Out: -   Neurologic: sedated Cardiac tachy, irregular Lungs rhonchi bilaterally  Lab Results: Recent Labs    02/11/22 0239 02/12/22 0339  WBC 16.5* 16.9*  HGB 11.0* 11.2*  HCT 35.4* 36.4  PLT 233 247   BMET:  Recent Labs    02/11/22 1501 02/12/22 0339  NA 141 141  K 4.1 4.5  CL 106 108  CO2 26 27  GLUCOSE 142* 185*  BUN 16 22*  CREATININE 1.51* 1.64*  CALCIUM 8.0* 7.7*    PT/INR: No results for input(s): LABPROT, INR in the last 72 hours. ABG    Component Value Date/Time   PHART 7.520 (H) 02/10/2022 1346   HCO3 28.3 (H) 02/10/2022 1347   TCO2 29 02/10/2022 1347   ACIDBASEDEF 12.0 (H) 02/08/2022 1407   O2SAT 69 02/12/2022 0339   CBG (last 3)  Recent Labs    02/11/22 1957 02/11/22 2317 02/12/22 0744  GLUCAP 171* 156* 153*    Assessment/Plan: S/P Procedure(s) (LRB): RIGHT/LEFT HEART CATH AND CORONARY ANGIOGRAPHY (N/A) Severe MR Remains intubated Purulent sputum from ETT On Zosyn for pneumonia   LOS: 5 days    Loreli Slot 02/12/2022

## 2022-02-12 NOTE — Progress Notes (Signed)
Patient ID: Lisa Ayala, female   DOB: [birth year], 56 y.o.   MRN: [id number]     Advanced Heart Failure Rounding Note  PCP-Cardiologist: Will Meredith Leeds, MD   Subjective:    - 5/17 Flash pulmonary edema. PEA arrest. Intubated. ACLS 1-2 min. ROSC. Placed on milrinone, norepi, and lasix drip.  - 5/19 LHC/RHC: Nonobstructive CAD. RA 9, PA 50/10, mean PCWP 12, CI 6.8.  Rapid SVT, underwent emergent DCCV.  - 5/20: Recurrent AF with RVR, DCCV attempted x 3 but unable to hold NSR.  - 5/21: Spontaneous conversion to NSR today.   Remains intubated/sedated. Spontaneously converted to NSR this morning after failing DCCV yesterday.  Amiodarone infusing at 60 mg/hr and on heparin gtt.  Co-ox 69% on milrinone 0.125 and NE 5.  CVP 10.   Tm 102.3 last night, procalcitonine 4. CXR with bibasilar opacities. Sputum green with GNRs and GPCs. Zosyn started.   Echo: 5/18 EF 60-65% severe MR. Mild AI     Objective:   Weight Range: 76.4 kg Body mass index is 26.38 kg/m.   Vital Signs:   Temp:  [98.6 F (37 C)-102.5 F (39.2 C)] 99.4 F (37.4 C) (05/21 0747) Pulse Rate:  [76-122] 87 (05/21 0600) Resp:  [19-24] 19 (05/21 0600) BP: (91-140)/(63-96) 118/79 (05/20 2200) SpO2:  [98 %-100 %] 100 % (05/21 0600) Arterial Line BP: (90-143)/(52-80) 135/71 (05/21 0600) FiO2 (%):  [40 %] 40 % (05/21 0321) Weight:  [76.4 kg] 76.4 kg (05/21 0500) Last BM Date : 02/10/22  Weight change: Filed Weights   02/09/22 0420 02/10/22 0409 02/12/22 0500  Weight: 67.4 kg 69.3 kg 76.4 kg    Intake/Output:   Intake/Output Summary (Last 24 hours) at 02/12/2022 0807 Last data filed at 02/12/2022 0600 Gross per 24 hour  Intake 4122.96 ml  Output 2900 ml  Net 1222.96 ml      Physical Exam   General: Sedated/intubated.  Neck:JVP 8-9 cm, no thyromegaly or thyroid nodule.  Lungs: Coarse BS CV: Nondisplaced PMI.  Heart tachy, irregular S1/S2, no S3/S4, no murmur.  No peripheral edema.   Abdomen: Soft,  nontender, no hepatosplenomegaly, no distention.  Skin: Intact without lesions or rashes.  Neurologic: Sedated on vent Extremities: No clubbing or cyanosis.  HEENT: Normal.   Telemetry   Atrial fibrillation rate 130s => NSR 50s. Personally reviewed  Labs    CBC Recent Labs    02/11/22 0239 02/12/22 0339  WBC 16.5* 16.9*  HGB 11.0* 11.2*  HCT 35.4* 36.4  MCV 91.0 91.0  PLT 233 415   Basic Metabolic Panel Recent Labs    02/11/22 0239 02/11/22 1501 02/12/22 0339  NA 142 141 141  K 3.5 4.1 4.5  CL 107 106 108  CO2 28 26 27   GLUCOSE 174* 142* 185*  BUN 19 16 22*  CREATININE 1.34* 1.51* 1.64*  CALCIUM 8.2* 8.0* 7.7*  MG 1.9 2.2  --   PHOS 3.0 3.2  --    Liver Function Tests Recent Labs    02/10/22 0402 02/11/22 0239  AST 44* 30  ALT 86* 67*  ALKPHOS 85 127*  BILITOT 1.3* 1.3*  PROT 4.7* 5.4*  ALBUMIN 2.1* 2.2*   No results for input(s): LIPASE, AMYLASE in the last 72 hours. Cardiac Enzymes No results for input(s): CKTOTAL, CKMB, CKMBINDEX, TROPONINI in the last 72 hours.  BNP: BNP (last 3 results) Recent Labs    07/02/21 1335 02/08/22 0351  BNP 1,157.2* 709.9*    ProBNP (last 3 results) No  results for input(s): PROBNP in the last 8760 hours.   D-Dimer No results for input(s): DDIMER in the last 72 hours. Hemoglobin A1C No results for input(s): HGBA1C in the last 72 hours. Fasting Lipid Panel No results for input(s): CHOL, HDL, LDLCALC, TRIG, CHOLHDL, LDLDIRECT in the last 72 hours. Thyroid Function Tests No results for input(s): TSH, T4TOTAL, T3FREE, THYROIDAB in the last 72 hours.  Invalid input(s): FREET3  Other results:   Imaging    No results found.   Medications:     Scheduled Medications:  chlorhexidine gluconate (MEDLINE KIT)  15 mL Mouth Rinse BID   Chlorhexidine Gluconate Cloth  6 each Topical Daily   docusate  100 mg Per Tube BID   fentaNYL (SUBLIMAZE) injection  50 mcg Intravenous Once   furosemide  80 mg  Intravenous Once   insulin aspart  2-6 Units Subcutaneous Q4H   mouth rinse  15 mL Mouth Rinse 10 times per day   pantoprazole sodium  40 mg Per Tube Daily   polyethylene glycol  17 g Per Tube Daily   potassium chloride  40 mEq Per Tube BID   [START ON 02/13/2022] sodium chloride flush  3 mL Intravenous Q12H   spironolactone  12.5 mg Per Tube Daily    Infusions:  amiodarone 60 mg/hr (02/12/22 0735)   dexmedetomidine (PRECEDEX) IV infusion 1 mcg/kg/hr (02/12/22 0600)   feeding supplement (VITAL AF 1.2 CAL) 1,000 mL (02/12/22 0515)   fentaNYL infusion INTRAVENOUS 200 mcg/hr (02/12/22 0703)   heparin 1,100 Units/hr (02/12/22 0600)   milrinone 0.125 mcg/kg/min (02/12/22 0600)   norepinephrine (LEVOPHED) Adult infusion 12 mcg/min (02/12/22 0600)   piperacillin-tazobactam (ZOSYN)  IV 12.5 mL/hr at 02/12/22 0600    PRN Medications: acetaminophen, fentaNYL, midazolam, ondansetron (ZOFRAN) IV    Patient Profile     56 y/o woman with severe MR and polysubstance abuse Lost to f/u. Admitted with AFL with RVR -> cardiogenic shock  Assessment/Plan   1. Shock, cardiogenic - in setting of severe MR and flash pulmonary edema. Lactic acid 2.6>1.9  - Now with fever, elevated PCT suspect PNA as well and may be component of septic shock. - CO-OX 69% on Norepi 5 mcg + milrinone 0.125 mcg. With rapid AF, will try to wean down NE further today (BP stable), with severe MR will try to continue current milrinone dose.   - CVP 10 today, will give Lasix 80 mg IV x 1.     2. PEA Arrest -ACLS x1 min with ROSC -Intubated. Placed on pressors.   3.  Acute Hypoxic Respiratory Failure due to flash pulmonary edema/PNA -Urgent intubation 5/17 -FiO2 down to 40%.  -Green sputum with fever, leukocytosis, PCT 4 => suspect PNA. Zosyn started.    4.  Severe MR due to rheumatic heart disease - ECHO EF 65-70% Grade III DD, severe MR and rheumatic - TEE 5/18 EF 60-65% mild AI severe MR - MVR once recovered. Cath  5/19 showed mild nonobstructive CAD.  - Have notified TCTS   5 . PAF/SVT - A flutter on admit. Spontaneously converted to SR.  - Repeat DC-CV for rapid SVT 5/19.  - She went into AF with RVR 5/20 early am, rate in the 140s-160s.  DCCV attempted again on 5/20 but failed. - Spontaneous conversion to NSR this morning.  - Continue amiodarone 60 mg/hr today.  - Continue heparin gtt.    6.  AKI due to ATN/shock -Creatinine 0.96--->1.8 -> 1.49 -> 1.34 -> 1.64 today -Avoid hypotension.  -  CVP 10. Will give 1 dose IV Lasix.   7. Tobacco/Polysubstance abuse  - tox screen +cocaine/THC  8. Hypokalemia - supp  9. Anxiety - severe   10. ID - Suspect PNA - CXR with bibasilar opacities.  - Febrile with PCT 4, WBCs 16.9, green sputum => started Zosyn.   CRITICAL CARE Performed by: Loralie Champagne  Total critical care time: 40 minutes  Critical care time was exclusive of separately billable procedures and treating other patients.  Critical care was necessary to treat or prevent imminent or life-threatening deterioration.  Critical care was time spent personally by me (independent of midlevel providers or residents) on the following activities: development of treatment plan with patient and/or surrogate as well as nursing, discussions with consultants, evaluation of patient's response to treatment, examination of patient, obtaining history from patient or surrogate, ordering and performing treatments and interventions, ordering and review of laboratory studies, ordering and review of radiographic studies, pulse oximetry and re-evaluation of patient's condition.   Length of Stay: [place], MD  02/12/2022, 8:07 AM  Advanced Heart Failure Team Pager [phone] (M-F; 7a - 5p)  Please contact [facility] for night-coverage after hours (5p -7a ) and weekends on [email or web address]

## 2022-02-13 ENCOUNTER — Encounter (HOSPITAL_COMMUNITY): Payer: Self-pay | Admitting: Internal Medicine

## 2022-02-13 ENCOUNTER — Inpatient Hospital Stay (HOSPITAL_COMMUNITY): Payer: Medicaid Other

## 2022-02-13 DIAGNOSIS — E440 Moderate protein-calorie malnutrition: Secondary | ICD-10-CM | POA: Insufficient documentation

## 2022-02-13 LAB — POCT I-STAT 7, (LYTES, BLD GAS, ICA,H+H)
Acid-Base Excess: 2 mmol/L (ref 0.0–2.0)
Bicarbonate: 26.4 mmol/L (ref 20.0–28.0)
Calcium, Ion: 1.17 mmol/L (ref 1.15–1.40)
HCT: 33 % — ABNORMAL LOW (ref 36.0–46.0)
Hemoglobin: 11.2 g/dL — ABNORMAL LOW (ref 12.0–15.0)
O2 Saturation: 93 %
Patient temperature: 37.9
Potassium: 4.1 mmol/L (ref 3.5–5.1)
Sodium: 142 mmol/L (ref 135–145)
TCO2: 28 mmol/L (ref 22–32)
pCO2 arterial: 41.2 mmHg (ref 32–48)
pH, Arterial: 7.418 (ref 7.35–7.45)
pO2, Arterial: 68 mmHg — ABNORMAL LOW (ref 83–108)

## 2022-02-13 LAB — GLUCOSE, CAPILLARY
Glucose-Capillary: 140 mg/dL — ABNORMAL HIGH (ref 70–99)
Glucose-Capillary: 140 mg/dL — ABNORMAL HIGH (ref 70–99)
Glucose-Capillary: 144 mg/dL — ABNORMAL HIGH (ref 70–99)
Glucose-Capillary: 144 mg/dL — ABNORMAL HIGH (ref 70–99)
Glucose-Capillary: 147 mg/dL — ABNORMAL HIGH (ref 70–99)

## 2022-02-13 LAB — BASIC METABOLIC PANEL
Anion gap: 7 (ref 5–15)
Anion gap: 9 (ref 5–15)
BUN: 22 mg/dL — ABNORMAL HIGH (ref 6–20)
BUN: 21 mg/dL — ABNORMAL HIGH (ref 6–20)
CO2: 26 mmol/L (ref 22–32)
CO2: 27 mmol/L (ref 22–32)
Calcium: 8.1 mg/dL — ABNORMAL LOW (ref 8.9–10.3)
Calcium: 8.1 mg/dL — ABNORMAL LOW (ref 8.9–10.3)
Chloride: 108 mmol/L (ref 98–111)
Chloride: 99 mmol/L (ref 98–111)
Creatinine, Ser: 1.56 mg/dL — ABNORMAL HIGH (ref 0.44–1.00)
Creatinine, Ser: 1.42 mg/dL — ABNORMAL HIGH (ref 0.44–1.00)
GFR, Estimated: 39 mL/min — ABNORMAL LOW (ref >60)
GFR, Estimated: 43 mL/min — ABNORMAL LOW (ref >60)
Glucose, Bld: 170 mg/dL — ABNORMAL HIGH (ref 70–99)
Glucose, Bld: 151 mg/dL — ABNORMAL HIGH (ref 70–99)
Potassium: 4.5 mmol/L (ref 3.5–5.1)
Potassium: 4.4 mmol/L (ref 3.5–5.1)
Sodium: 141 mmol/L (ref 135–145)
Sodium: 135 mmol/L (ref 135–145)

## 2022-02-13 LAB — COOXEMETRY PANEL
Carboxyhemoglobin: 1.9 % — ABNORMAL HIGH (ref 0.5–1.5)
Methemoglobin: <0.7 % (ref 0.0–1.5)
O2 Saturation: 74.7 %
Total hemoglobin: 10.8 g/dL — ABNORMAL LOW (ref 12.0–16.0)

## 2022-02-13 LAB — CBC
HCT: 34.5 % — ABNORMAL LOW (ref 36.0–46.0)
Hemoglobin: 10.5 g/dL — ABNORMAL LOW (ref 12.0–15.0)
MCH: 28.2 pg (ref 26.0–34.0)
MCHC: 30.4 g/dL (ref 30.0–36.0)
MCV: 92.5 fL (ref 80.0–100.0)
Platelets: 247 10*3/uL (ref 150–400)
RBC: 3.73 MIL/uL — ABNORMAL LOW (ref 3.87–5.11)
RDW: 18.2 % — ABNORMAL HIGH (ref 11.5–15.5)
WBC: 17.7 10*3/uL — ABNORMAL HIGH (ref 4.0–10.5)
nRBC: 0.2 % (ref 0.0–0.2)

## 2022-02-13 LAB — CULTURE, RESPIRATORY W GRAM STAIN

## 2022-02-13 LAB — HEPARIN LEVEL (UNFRACTIONATED): Heparin Unfractionated: 0.26 IU/mL — ABNORMAL LOW (ref 0.30–0.70)

## 2022-02-13 LAB — MAGNESIUM
Magnesium: 1.9 mg/dL (ref 1.7–2.4)
Magnesium: 2.1 mg/dL (ref 1.7–2.4)

## 2022-02-13 MED ORDER — PROPOFOL 1000 MG/100ML IV EMUL
5.0000 ug/kg/min | INTRAVENOUS | Status: DC
  Administered 2022-02-13: 5 ug/kg/min via INTRAVENOUS
  Administered 2022-02-14: 15 ug/kg/min via INTRAVENOUS
  Filled 2022-02-13 (×2): qty 100

## 2022-02-13 MED ORDER — AMIODARONE LOAD VIA INFUSION
150.0000 mg | Freq: Once | INTRAVENOUS | Status: AC
  Administered 2022-02-13: 150 mg via INTRAVENOUS

## 2022-02-13 MED ORDER — MIDAZOLAM-SODIUM CHLORIDE 100-0.9 MG/100ML-% IV SOLN
0.5000 mg/h | INTRAVENOUS | Status: DC
  Administered 2022-02-13: 0.5 mg/h via INTRAVENOUS
  Filled 2022-02-13: qty 100

## 2022-02-13 MED ORDER — AMIODARONE IV BOLUS ONLY 150 MG/100ML: 150.0000 mg | Freq: Once | INTRAVENOUS | Status: DC

## 2022-02-13 MED ORDER — AMIODARONE LOAD VIA INFUSION
150.0000 mg | Freq: Once | INTRAVENOUS | Status: AC
  Administered 2022-02-13: 150 mg via INTRAVENOUS
  Filled 2022-02-13: qty 83.34

## 2022-02-13 MED ORDER — MIDAZOLAM HCL 2 MG/2ML IJ SOLN
1.0000 mg | INTRAMUSCULAR | Status: AC | PRN
  Administered 2022-02-13 (×4): 2 mg via INTRAVENOUS
  Administered 2022-02-14: 1 mg via INTRAVENOUS
  Administered 2022-02-14: 2 mg via INTRAVENOUS
  Filled 2022-02-13 (×6): qty 2

## 2022-02-13 MED ORDER — METOLAZONE 2.5 MG PO TABS
2.5000 mg | ORAL_TABLET | Freq: Once | ORAL | Status: AC
  Administered 2022-02-13: 2.5 mg via ORAL
  Filled 2022-02-13: qty 1

## 2022-02-13 MED ORDER — FUROSEMIDE 10 MG/ML IJ SOLN
80.0000 mg | Freq: Once | INTRAMUSCULAR | Status: AC
  Administered 2022-02-13: 80 mg via INTRAVENOUS
  Filled 2022-02-13: qty 8

## 2022-02-13 MED ORDER — MAGNESIUM SULFATE 2 GM/50ML IV SOLN
2.0000 g | Freq: Once | INTRAVENOUS | Status: AC
  Administered 2022-02-13: 2 g via INTRAVENOUS
  Filled 2022-02-13: qty 50

## 2022-02-13 NOTE — Progress Notes (Signed)
ANTICOAGULATION CONSULT NOTE - Follow Up Consult  Pharmacy Consult for heparin gtt Indication: atrial fibrillation  No Known Allergies  Patient Measurements: Weight: 76.3 kg (168 lb 3.4 oz) Heparin Dosing Weight: 68.1 kg  Vital Signs: Temp: 99.9 F (37.7 C) (05/22 0800) Temp Source: Oral (05/22 0800) BP: 142/63 (05/22 0814) Pulse Rate: 58 (05/22 0830)  Labs: Recent Labs    02/11/22 0239 02/11/22 0300 02/11/22 1501 02/12/22 0339 02/13/22 0345 02/13/22 0427  HGB 11.0*  --   --  11.2* 10.5* 11.2*  HCT 35.4*  --   --  36.4 34.5* 33.0*  PLT 233  --   --  247 247  --   HEPARINUNFRC  --    < > 0.61 0.58 0.26*  --   CREATININE 1.34*  --  1.51* 1.64* 1.56*  --    < > = values in this interval not displayed.     Estimated Creatinine Clearance: 42.4 mL/min (A) (by C-G formula based on SCr of 1.56 mg/dL (H)).  Assessment: 23 yof with a history of SVT, mitral regurg TR, AI, anxiety. Patient is presenting with tachycardia. Heparin per pharmacy consult placed for atrial fibrillation. Patient is not on anticoagulation prior to arrival.  Heparin drip 1100 uts/hr with heparin level 0.58 at top of goal > decreased heparin drip down to 1000 uts/hr and heparin level dropped 0.26 > will increase back to 100 uts/hr No issues noted per chart review. In and out Afib on amiodarone  Cbc stable   Goal of Therapy:  Heparin level 0.3-0.7 units/ml Monitor platelets by anticoagulation protocol: Yes   Plan:  Increase heparin 1100 units/hr  Daily heparin level and CBC Monitor for s/sx of bleeding    Bonnita Nasuti Pharm.D. CPP, BCPS Clinical Pharmacist [phone] 02/13/2022 9:59 AM    Please check [email or web address] for unit-specific pharmacy phone numbers.

## 2022-02-13 NOTE — Progress Notes (Signed)
NAME:  Lisa Ayala, MRN:  [id number], DOB:  Oct 01, 1965, LOS: 6 ADMISSION DATE:  02/07/2022, CONSULTATION DATE:  5/17 REFERRING MD:  Dr. Bjorn Pippin, CHIEF COMPLAINT:  hypoxia   History of Present Illness:  56 year old female with PMH as below, which is significant for severe MR, moderate TR, and SVT. She had an episode of SVT in October of 22 and as part of her cardiology follow up she was noted to have murmur. Echo showed extensive valvular disease including wide open MR. She unfortunately missed her scheduled TEE. Presented to EP clinic 5/16 with complaints of palpitations and dyspnea. She was found to be in a rapid atrial rhythm and was sent to ED for further evaluation. In the ED she was worked up for SVT and had several rounds of adenosine given with some evidence of atrial flutter when rate slowed. She was started on heparin for flutter and was planned for TEE/DCCV. Prior to the case beginning on 5/17 she rapidly declined with progressive dyspnea, flash pulmonary edema, and hypoxia requiring intubation. Briefly suffered asystolic arrest during intubation. PCCM asked to evaluate for vent management.   Pertinent  Medical History   has a past medical history of Aortic insufficiency, PSVT (paroxysmal supraventricular tachycardia) (HCC), Severe mitral regurgitation, and Tricuspid regurgitation.  Significant Hospital Events: Including procedures, antibiotic start and stop dates in addition to other pertinent events   5/16 admit for A flutter 5/17 decompensated in endoscopy prior to TEE. Intubated. Brief asystolic arrest.  5/19 SVT requiring emergent cardioversion  Interim History / Subjective:  In sinus Working on sedation wean  Objective   Blood pressure (!) 142/63, pulse 78, temperature 99.9 F (37.7 C), temperature source Oral, resp. rate 14, weight 76.3 kg, SpO2 100 %. CVP:  [7 mmHg-9 mmHg] 9 mmHg  Vent Mode: PSV;CPAP FiO2 (%):  [40 %] 40 % Set Rate:  [20 bmp] 20 bmp Vt Set:  [490 mL]  490 mL PEEP:  [5 cmH20] 5 cmH20 Pressure Support:  [12 cmH20] 12 cmH20 Plateau Pressure:  [15 cmH20-16 cmH20] 15 cmH20   Intake/Output Summary (Last 24 hours) at 02/13/2022 9480 Last data filed at 02/13/2022 0800 Gross per 24 hour  Intake 2764.66 ml  Output 4250 ml  Net -1485.34 ml    Filed Weights   02/10/22 0409 02/12/22 0500 02/13/22 0500  Weight: 69.3 kg 76.4 kg 76.3 kg    Examination: Sedated, chronically ill appearing Heart sounds regular, +systolic murmur, ext warm Opens eyes to voice, not yet following commands Breath sounds with crackles bases Minimal tracheal secretions  WBC up, still borderline febrile Tracheal aspirate still pending Net neg 600 CXR maybe mildly improved   Resolved Hospital Problem list     Assessment & Plan:   Acute hypoxemic respiratory failure secondary to flash pulmonary edema in the setting of severe MR.  Possible HAP Cardiogenic vs septic shock Severe MR due to rheumatic heart dz Brief PEA arrest 5/17 Atrial flutter with RVR: CHA2DS2VASc score 1 SVT: rates as high as 200 S/p emergent DC-CV 5/19 AKI vs. CKD Substance abuse: THC and cocaine on UDS  - Vent support, VAP prevention bundle - Afib/RVR with sedation weans has been limiting factor - Would like to see if we can push fluid removal, will be tough with mitral insufficiency, will d/w CHF team - Inotrope titration per CHF team - Levophed titrated to MAP 65 - Continue zosyn, f/u culture data - Continue amio and heparin drip - Will try lightening sedation to precedex monotherapy  Best Practice (right click and "Reselect all SmartList Selections" daily)   Diet/type: NPO; TF DVT prophylaxis: systemic heparin GI prophylaxis: PPI Lines: Central line and Arterial Line Foley:  Yes, and it is still needed Code Status:  full code Last date of multidisciplinary goals of care discussion: will update as able   Critical care time: 43 minutes    Myrla Halsted MD PCCM

## 2022-02-13 NOTE — Progress Notes (Addendum)
1215: Patient into SVT 160s. Katrinka Blazing MD and Bensimhon MD notified. Orders received for 150 mg amiodarone.   1245: EKG obtained showing a fib RVR. Bensimhon MD notified. Orders received for another 150 mg amiodarone.

## 2022-02-13 NOTE — Progress Notes (Signed)
eLink Physician-Brief Progress Note Patient Name: Lisa Ayala DOB: 1966/04/02 MRN: [id number]   Date of Service  02/13/2022  HPI/Events of Note  Patient with sub-optimal sedation despite Precedex and Fentanyl maximum dose gtt, and PRN iv Versed, systolic blood pressure rising to > 200  and heart rate > 170.  eICU Interventions  Versed gtt ordered. Plan is to wean Precedex as tolerated once Versed gtt is running.        Thomasene Lot Ka Bench 02/13/2022, 4:06 AM

## 2022-02-13 NOTE — Progress Notes (Addendum)
Patient ID: Lisa Ayala, female   DOB: Oct 18, 1965, 56 y.o.   MRN: [id number]     Advanced Heart Failure Rounding Note  PCP-Cardiologist: Will Meredith Leeds, MD   Subjective:    - 5/17 Flash pulmonary edema. PEA arrest. Intubated. ACLS 1-2 min. ROSC. Placed on milrinone, norepi, and lasix drip.  - 5/19 LHC/RHC: Nonobstructive CAD. RA 9, PA 50/10, mean PCWP 12, CI 6.8.  Rapid SVT, underwent emergent DCCV.  - 5/20: Recurrent AF with RVR, DCCV attempted x 3 but unable to hold NSR.  - 5/21: Spontaneous conversion to NSR  In and out of Afib overnight, currently NSR, converted ~6AM. On Amio gtt at 60/hr + heparin.   Co-ox 75% on milrinone 0.125 and NE 5.    3.9L in UOP yesterday. CVP 8-9. SCr 1.64>>1.56   Tm 100.3 last night, procalcitonine 4. Tracheal aspirate + for GNRs and GPCs. Remains on Zosyn. Remains intubated, FiO2 40%   Echo: 5/18 EF 60-65% severe MR. Mild AI     Objective:   Weight Range: 76.3 kg Body mass index is 26.35 kg/m.   Vital Signs:   Temp:  [99.7 F (37.6 C)-100.3 F (37.9 C)] 100.3 F (37.9 C) (05/21 2350) Pulse Rate:  [57-98] 61 (05/22 0500) Resp:  [11-24] 20 (05/22 0600) BP: (92-141)/(62-96) 109/93 (05/22 0400) SpO2:  [95 %-100 %] 100 % (05/22 0500) Arterial Line BP: (93-168)/(45-84) 132/60 (05/22 0600) FiO2 (%):  [40 %] 40 % (05/22 0307) Weight:  [76.3 kg] 76.3 kg (05/22 0500) Last BM Date : 02/10/22  Weight change: Filed Weights   02/10/22 0409 02/12/22 0500 02/13/22 0500  Weight: 69.3 kg 76.4 kg 76.3 kg    Intake/Output:   Intake/Output Summary (Last 24 hours) at 02/13/2022 0809 Last data filed at 02/13/2022 0800 Gross per 24 hour  Intake 3031.66 ml  Output 4250 ml  Net -1218.34 ml      Physical Exam   CVP 8-9  General:  ill appearing. Intubated and sedated  HEENT: + ETT, dentition in poor repair  Neck: supple. JVD 9 cm. +rt IJ CVC Carotids 2+ bilat; no bruits. No lymphadenopathy or thyromegaly appreciated. Cor: PMI nondisplaced.  Regular rate & rhythm. No rubs, gallops or murmurs. + zoll pads Lungs: intubated and course  Abdomen: soft, nontender, nondistended. No hepatosplenomegaly. No bruits or masses. Good bowel sounds. Extremities: no cyanosis, clubbing, rash, trace b/l LE edema Neuro: alert & oriented x 3, cranial nerves grossly intact. moves all 4 extremities w/o difficulty. Affect pleasant.   Telemetry   NSR currently, 70s. Personally reviewed  Labs    CBC Recent Labs    02/12/22 0339 02/13/22 0345 02/13/22 0427  WBC 16.9* 17.7*  --   HGB 11.2* 10.5* 11.2*  HCT 36.4 34.5* 33.0*  MCV 91.0 92.5  --   PLT 247 247  --    Basic Metabolic Panel Recent Labs    02/11/22 0239 02/11/22 1501 02/12/22 0339 02/13/22 0345 02/13/22 0427  NA 142 141 141 141 142  K 3.5 4.1 4.5 4.5 4.1  CL 107 106 108 108  --   CO2 28 26 27 26   --   GLUCOSE 174* 142* 185* 170*  --   BUN 19 16 22* 22*  --   CREATININE 1.34* 1.51* 1.64* 1.56*  --   CALCIUM 8.2* 8.0* 7.7* 8.1*  --   MG 1.9 2.2 2.2 1.9  --   PHOS 3.0 3.2  --   --   --    Liver  Function Tests Recent Labs    02/11/22 0239  AST 30  ALT 67*  ALKPHOS 127*  BILITOT 1.3*  PROT 5.4*  ALBUMIN 2.2*   No results for input(s): LIPASE, AMYLASE in the last 72 hours. Cardiac Enzymes No results for input(s): CKTOTAL, CKMB, CKMBINDEX, TROPONINI in the last 72 hours.  BNP: BNP (last 3 results) Recent Labs    07/02/21 1335 02/08/22 0351  BNP 1,157.2* 709.9*    ProBNP (last 3 results) No results for input(s): PROBNP in the last 8760 hours.   D-Dimer No results for input(s): DDIMER in the last 72 hours. Hemoglobin A1C No results for input(s): HGBA1C in the last 72 hours. Fasting Lipid Panel No results for input(s): CHOL, HDL, LDLCALC, TRIG, CHOLHDL, LDLDIRECT in the last 72 hours. Thyroid Function Tests No results for input(s): TSH, T4TOTAL, T3FREE, THYROIDAB in the last 72 hours.  Invalid input(s): FREET3  Other results:   Imaging    No  results found.   Medications:     Scheduled Medications:  chlorhexidine gluconate (MEDLINE KIT)  15 mL Mouth Rinse BID   Chlorhexidine Gluconate Cloth  6 each Topical Daily   docusate  100 mg Per Tube BID   fentaNYL (SUBLIMAZE) injection  50 mcg Intravenous Once   insulin aspart  2-6 Units Subcutaneous Q4H   mouth rinse  15 mL Mouth Rinse 10 times per day   pantoprazole sodium  40 mg Per Tube Daily   polyethylene glycol  17 g Per Tube Daily   potassium chloride  40 mEq Per Tube BID   sodium chloride flush  3 mL Intravenous Q12H   spironolactone  12.5 mg Per Tube Daily    Infusions:  amiodarone 60 mg/hr (02/13/22 0600)   dexmedetomidine (PRECEDEX) IV infusion Stopped (02/13/22 0410)   feeding supplement (VITAL AF 1.2 CAL) 60 mL/hr at 02/12/22 1800   fentaNYL infusion INTRAVENOUS 400 mcg/hr (02/13/22 0640)   heparin 1,000 Units/hr (02/13/22 0600)   midazolam 5 mg/hr (02/13/22 0600)   milrinone 0.125 mcg/kg/min (02/13/22 0600)   norepinephrine (LEVOPHED) Adult infusion 6 mcg/min (02/13/22 0600)   piperacillin-tazobactam (ZOSYN)  IV 3.375 g (02/13/22 0642)    PRN Medications: acetaminophen, fentaNYL, midazolam, ondansetron (ZOFRAN) IV    Patient Profile   56 y/o woman with severe MR and polysubstance abuse Lost to f/u. Admitted with AFL with RVR -> cardiogenic shock  Assessment/Plan   1. Shock, cardiogenic - in setting of severe MR and flash pulmonary edema. Lactic acid 2.6>1.9  - Now with fever, elevated PCT suspect PNA as well and may be component of septic shock. - CO-OX 75% on Norepi 5 mcg + milrinone 0.125 mcg.  - CVP 8-9  - Continue milrinone at current dose. Wean NE as tolerated   2. PEA Arrest -ACLS x1 min with ROSC -Intubated. Placed on pressors.   3.  Acute Hypoxic Respiratory Failure due to flash pulmonary edema/PNA -Urgent intubation 5/17 -FiO2 down to 40%.  -Green sputum with fever, leukocytosis, PCT 4 => suspect PNA. Zosyn started.    4.  Severe MR  due to rheumatic heart disease - ECHO EF 65-70% Grade III DD, severe MR and rheumatic - TEE 5/18 EF 60-65% mild AI severe MR - Cath 5/19 showed mild nonobstructive CAD.  - Seen by TCTS. Plan MVR once recovered   5 . PAF/SVT - A flutter on admit. Spontaneously converted to SR.  - Repeat DC-CV for rapid SVT 5/19.  - She went into AF with RVR 5/20 early am,  rate in the 140s-160s.  DCCV attempted again on 5/20 but failed. - Spontaneous conversion to NSR this morning.  - Continue amiodarone 60 mg/hr today.  - Continue heparin gtt.    6.  AKI due to ATN/shock -Creatinine 0.96--->1.8 -> 1.49 -> 1.34 -> 1.64->1.56 today -Avoid hypotension.    7. Tobacco/Polysubstance abuse  - tox screen +cocaine/THC  8. Hypokalemia  - K 4.1 today - supp PRN   9. Anxiety - severe   10. ID - Suspect PNA - CXR with bibasilar opacities.  - Febrile with PCT 4, WBCs 17.7, green sputum => started Zosyn.    Length of Stay: [address] [place]  02/13/2022, 8:09 AM  Advanced Heart Failure Team Pager [phone] (M-F; 7a - 5p)  Please contact [facility] for night-coverage after hours (5p -7a ) and weekends on [email or web address]  Agree with above.   Remains sedated on vent. On milrinone and NE. Co-ox 75%. On Zosyn for PNA.   Remains in NSR on IV amio.   General:  Intubated/sedated  HEENT: normal + ETT Neck: supple. RIJ TLC Carotids 2+ bilat; no bruits. No lymphadenopathy or thryomegaly appreciated. Cor: PMI nondisplaced. Regular rate & rhythm. No rubs, gallops or murmurs. Lungs: clear Abdomen: soft, nontender, nondistended. No hepatosplenomegaly. No bruits or masses. Good bowel sounds. Extremities: no cyanosis, clubbing, rash, edema Neuro: intubated/sedated  Remains tenuous. Maintaining sinus this am on IV amio. Still requiring NE/milrinone. Hopefully can extubate today. Will need eventual MVR.  CRITICAL CARE Performed by: Glori Bickers  Total critical care time: 35 minutes  Critical  care time was exclusive of separately billable procedures and treating other patients.  Critical care was necessary to treat or prevent imminent or life-threatening deterioration.  Critical care was time spent personally by me (independent of midlevel providers or residents) on the following activities: development of treatment plan with patient and/or surrogate as well as nursing, discussions with consultants, evaluation of patient's response to treatment, examination of patient, obtaining history from patient or surrogate, ordering and performing treatments and interventions, ordering and review of laboratory studies, ordering and review of radiographic studies, pulse oximetry and re-evaluation of patient's condition.  Glori Bickers, MD  9:04 AM

## 2022-02-13 NOTE — TOC CM/SW Note (Addendum)
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 She is living in the apt and is looking for a job. States they need assistance with electric, water and phone bill. Explained we are waiting closer to time pt is medical stable to assist with dc plan. Pt does not have insurance and will need appt with PCP and assistance with medications. Will use MATCH/HF funds to assist with meds. Will continue to follow for dc needs.  Isidoro Donning RN3 CCM, Heart Failure TOC CM [phone]   HF TOC CM attempted call to dtr, [name], Left message for return call. Isidoro Donning RN3 CCM, Heart Failure TOC CM [phone]

## 2022-02-13 NOTE — Progress Notes (Signed)
[facility] ADULT ICU REPLACEMENT PROTOCOL   The patient does apply for the [facility] Adult ICU Electrolyte Replacment Protocol based on the criteria listed below:   1.Exclusion criteria: TCTS patients, ECMO patients, and Dialysis patients 2. Is GFR >/= 30 ml/min? Yes.    Patient's GFR today is 39 3. Is SCr </= 2? Yes.   Patient's SCr is 1.56 mg/dL 4. Did SCr increase >/= 0.5 in 24 hours? No. 5.Pt's weight >40kg  Yes.   6. Abnormal electrolyte(s): Mag  7. Electrolytes replaced per protocol 8.  Call MD STAT for K+ </= 2.5, Phos </= 1, or Mag </= 1 Physician:  [facility] 02/13/2022 4:27 AM

## 2022-02-13 NOTE — TOC Progression Note (Signed)
Transition of Care [facility]) - Progression Note    Patient Details  Name: Lisa Ayala MRN: [id number] Date of Birth: 1965-09-28  Transition of Care [facility] CM/SW Contact  Tamecka Milham, LCSW Phone Number: 02/13/2022, 10:24 AM  Clinical Narrative:    [facility] CSW heard from [facility] that they have referred [name] over to [facility] [employer] for a [employer].   TOC will continue to follow for DC needs.        Expected Discharge Plan and Services                                                 Social Determinants of Health (SDOH) Interventions    Readmission Risk Interventions     View : No data to display.

## 2022-02-13 NOTE — Progress Notes (Addendum)
[place] Progress Note Patient Name: Lisa Ayala DOB: August 25, 1966 MRN: [id number]   Date of Service  02/13/2022  HPI/Events of Note  AFRVR  eICU Interventions  Amio bolus BMET and Mg ordered Telemetry   02/13/22 11:56 PM. Remains in [place] 140-150s. Stable levophed 14. Electrolytes appropriate. Continue supportive care. Will repeat amio bolus if HR >150  Intervention Category Intermediate Interventions: Arrhythmia - evaluation and management  Brysan Mcevoy Rodman Pickle 02/13/2022, 10:51 PM

## 2022-02-14 LAB — BASIC METABOLIC PANEL
Anion gap: 13 (ref 5–15)
Anion gap: 14 (ref 5–15)
BUN: 23 mg/dL — ABNORMAL HIGH (ref 6–20)
BUN: 26 mg/dL — ABNORMAL HIGH (ref 6–20)
CO2: 25 mmol/L (ref 22–32)
CO2: 27 mmol/L (ref 22–32)
Calcium: 8.7 mg/dL — ABNORMAL LOW (ref 8.9–10.3)
Calcium: 8.6 mg/dL — ABNORMAL LOW (ref 8.9–10.3)
Chloride: 100 mmol/L (ref 98–111)
Chloride: 96 mmol/L — ABNORMAL LOW (ref 98–111)
Creatinine, Ser: 1.61 mg/dL — ABNORMAL HIGH (ref 0.44–1.00)
Creatinine, Ser: 1.49 mg/dL — ABNORMAL HIGH (ref 0.44–1.00)
GFR, Estimated: 37 mL/min — ABNORMAL LOW (ref >60)
GFR, Estimated: 41 mL/min — ABNORMAL LOW (ref >60)
Glucose, Bld: 168 mg/dL — ABNORMAL HIGH (ref 70–99)
Glucose, Bld: 179 mg/dL — ABNORMAL HIGH (ref 70–99)
Potassium: 6 mmol/L — ABNORMAL HIGH (ref 3.5–5.1)
Potassium: 4.8 mmol/L (ref 3.5–5.1)
Sodium: 138 mmol/L (ref 135–145)
Sodium: 137 mmol/L (ref 135–145)

## 2022-02-14 LAB — PROCALCITONIN: Procalcitonin: 4.11 ng/mL

## 2022-02-14 LAB — CBC
HCT: 36 % (ref 36.0–46.0)
Hemoglobin: 11.4 g/dL — ABNORMAL LOW (ref 12.0–15.0)
MCH: 28.2 pg (ref 26.0–34.0)
MCHC: 31.7 g/dL (ref 30.0–36.0)
MCV: 89.1 fL (ref 80.0–100.0)
Platelets: 338 10*3/uL (ref 150–400)
RBC: 4.04 MIL/uL (ref 3.87–5.11)
RDW: 18.3 % — ABNORMAL HIGH (ref 11.5–15.5)
WBC: 16.7 10*3/uL — ABNORMAL HIGH (ref 4.0–10.5)
nRBC: 0.4 % — ABNORMAL HIGH (ref 0.0–0.2)

## 2022-02-14 LAB — GLUCOSE, CAPILLARY
Glucose-Capillary: 100 mg/dL — ABNORMAL HIGH (ref 70–99)
Glucose-Capillary: 128 mg/dL — ABNORMAL HIGH (ref 70–99)
Glucose-Capillary: 136 mg/dL — ABNORMAL HIGH (ref 70–99)
Glucose-Capillary: 147 mg/dL — ABNORMAL HIGH (ref 70–99)
Glucose-Capillary: 169 mg/dL — ABNORMAL HIGH (ref 70–99)
Glucose-Capillary: 179 mg/dL — ABNORMAL HIGH (ref 70–99)
Glucose-Capillary: 186 mg/dL — ABNORMAL HIGH (ref 70–99)
Glucose-Capillary: 199 mg/dL — ABNORMAL HIGH (ref 70–99)

## 2022-02-14 LAB — HEPARIN LEVEL (UNFRACTIONATED): Heparin Unfractionated: 0.39 IU/mL (ref 0.30–0.70)

## 2022-02-14 LAB — TRIGLYCERIDES: Triglycerides: 151 mg/dL — ABNORMAL HIGH (ref <150)

## 2022-02-14 LAB — COOXEMETRY PANEL
Carboxyhemoglobin: 1.9 % — ABNORMAL HIGH (ref 0.5–1.5)
Methemoglobin: <0.7 % (ref 0.0–1.5)
O2 Saturation: 76.4 %
Total hemoglobin: 11.8 g/dL — ABNORMAL LOW (ref 12.0–16.0)

## 2022-02-14 LAB — MAGNESIUM: Magnesium: 2 mg/dL (ref 1.7–2.4)

## 2022-02-14 MED ORDER — CHLORHEXIDINE GLUCONATE 0.12 % MT SOLN
15.0000 mL | Freq: Two times a day (BID) | OROMUCOSAL | Status: DC
  Administered 2022-02-14 – 2022-02-21 (×15): 15 mL via OROMUCOSAL
  Filled 2022-02-14 (×13): qty 15

## 2022-02-14 MED ORDER — DILTIAZEM HCL-DEXTROSE 125-5 MG/125ML-% IV SOLN (PREMIX)
5.0000 mg/h | INTRAVENOUS | Status: DC
  Administered 2022-02-14: 15 mg/h via INTRAVENOUS
  Administered 2022-02-14: 5 mg/h via INTRAVENOUS
  Administered 2022-02-14 – 2022-02-15 (×5): 20 mg/h via INTRAVENOUS
  Administered 2022-02-15 – 2022-02-18 (×7): 15 mg/h via INTRAVENOUS
  Filled 2022-02-14 (×14): qty 125

## 2022-02-14 MED ORDER — ORAL CARE MOUTH RINSE
15.0000 mL | Freq: Two times a day (BID) | OROMUCOSAL | Status: DC
  Administered 2022-02-14 – 2022-02-18 (×10): 15 mL via OROMUCOSAL

## 2022-02-14 MED ORDER — FUROSEMIDE 10 MG/ML IJ SOLN
80.0000 mg | Freq: Once | INTRAMUSCULAR | Status: AC
  Administered 2022-02-14: 80 mg via INTRAVENOUS
  Filled 2022-02-14: qty 8

## 2022-02-14 MED ORDER — HYDRALAZINE HCL 20 MG/ML IJ SOLN
5.0000 mg | INTRAMUSCULAR | Status: DC | PRN
  Administered 2022-02-14: 5 mg via INTRAVENOUS
  Filled 2022-02-14: qty 1

## 2022-02-14 MED ORDER — LABETALOL HCL 5 MG/ML IV SOLN
20.0000 mg | Freq: Once | INTRAVENOUS | Status: AC
  Administered 2022-02-14: 20 mg via INTRAVENOUS
  Filled 2022-02-14: qty 4

## 2022-02-14 MED ORDER — ROCURONIUM BROMIDE 10 MG/ML (PF) SYRINGE
PREFILLED_SYRINGE | INTRAVENOUS | Status: AC
  Filled 2022-02-14: qty 10

## 2022-02-14 MED ORDER — HYDRALAZINE HCL 20 MG/ML IJ SOLN
10.0000 mg | Freq: Once | INTRAMUSCULAR | Status: AC
  Administered 2022-02-14: 10 mg via INTRAVENOUS
  Filled 2022-02-14: qty 1

## 2022-02-14 MED ORDER — DILTIAZEM LOAD VIA INFUSION
10.0000 mg | Freq: Once | INTRAVENOUS | Status: AC
  Administered 2022-02-14: 10 mg via INTRAVENOUS
  Filled 2022-02-14: qty 10

## 2022-02-14 MED ORDER — AMIODARONE LOAD VIA INFUSION
150.0000 mg | Freq: Once | INTRAVENOUS | Status: AC
  Administered 2022-02-14: 150 mg via INTRAVENOUS
  Filled 2022-02-14: qty 83.34

## 2022-02-14 MED ORDER — KETAMINE HCL 50 MG/5ML IJ SOSY
PREFILLED_SYRINGE | INTRAMUSCULAR | Status: AC
  Filled 2022-02-14: qty 5

## 2022-02-14 MED ORDER — ETOMIDATE 2 MG/ML IV SOLN
INTRAVENOUS | Status: AC
  Filled 2022-02-14: qty 20

## 2022-02-14 MED ORDER — SUCCINYLCHOLINE CHLORIDE 200 MG/10ML IV SOSY
PREFILLED_SYRINGE | INTRAVENOUS | Status: AC
  Filled 2022-02-14: qty 10

## 2022-02-14 MED ORDER — AMIODARONE IV BOLUS ONLY 150 MG/100ML: 150.0000 mg | Freq: Once | INTRAVENOUS | Status: DC

## 2022-02-14 MED FILL — Medication: Qty: 1 | Status: AC

## 2022-02-14 NOTE — Procedures (Signed)
Extubation Procedure Note  Patient Details:   Name: OPAL DINNING DOB: Feb 25, 1966 MRN: [id number]   Airway Documentation:    Vent end date: 02/14/22 Vent end time: 0839   Evaluation  O2 sats: stable throughout Complications: No apparent complications Patient did tolerate procedure well. Bilateral Breath Sounds: Clear, Diminished   No  Pt extubated per MD order. Placed on 6L [place]. No cuff leak heard. No stridor heard at this time. RT will continue to monitor.  Memory Argue 02/14/2022, 8:43 AM

## 2022-02-14 NOTE — Progress Notes (Signed)
Pt resting comfortably on 4L [place] at this time no distress noted. Bipap not needed at this time. RN agrees, will continue to monitor throughout night as needed.

## 2022-02-14 NOTE — Progress Notes (Addendum)
Patient ID: CHASITTY HEHL, female   DOB: 1966/04/25, 56 y.o.   MRN: [id number]     Advanced Heart Failure Rounding Note  PCP-Cardiologist: Will Meredith Leeds, MD   Subjective:    - 5/17 Flash pulmonary edema. PEA arrest. Intubated. ACLS 1-2 min. ROSC. Placed on milrinone, norepi, and lasix drip.  - 5/19 LHC/RHC: Nonobstructive CAD. RA 9, PA 50/10, mean PCWP 12, CI 6.8.  Rapid SVT, underwent emergent DCCV.  - 5/20: Recurrent AF with RVR, DCCV attempted x 3 but unable to hold NSR.  - 5/21: Spontaneous conversion to NSR  Reverted back to Afib w/ RVR yesterday. V-rates continue 150s-170s despite multiple Amio boluses. Continues on gtt at 60/hr + heparin gtt   Remains on Milrinone 0.125. Off NE. Co-ox 76%. BP markedly elevated 270W systolic, MAPs 237S.   Good diuresis yesterday w/ 4.8L in UOP. Wt down 13 lb. CVP 15  BMP pending Mg 2.0   Remains intubated. Off sedation. Remains on Zosyn for PNA. Resp Cx + Group C Strep   Echo: 5/18 EF 60-65% severe MR. Mild AI     Objective:   Weight Range: 70.7 kg Body mass index is 24.41 kg/m.   Vital Signs:   Temp:  [97.6 F (36.4 C)-99.9 F (37.7 C)] 99 F (37.2 C) (05/23 0726) Pulse Rate:  [55-180] 142 (05/23 0600) Resp:  [8-27] 20 (05/23 0600) BP: (106-194)/(58-138) 157/98 (05/23 0600) SpO2:  [92 %-100 %] 100 % (05/23 0600) Arterial Line BP: (93-190)/(45-103) 156/86 (05/23 0600) FiO2 (%):  [40 %] 40 % (05/23 0327) Weight:  [70.7 kg] 70.7 kg (05/23 0500) Last BM Date : 02/10/22  Weight change: Filed Weights   02/12/22 0500 02/13/22 0500 02/14/22 0500  Weight: 76.4 kg 76.3 kg 70.7 kg    Intake/Output:   Intake/Output Summary (Last 24 hours) at 02/14/2022 0738 Last data filed at 02/14/2022 0600 Gross per 24 hour  Intake 3611.85 ml  Output 4810 ml  Net -1198.15 ml      Physical Exam   CVP 15  General: critically ill, intubated awake on vent, no distress  HEENT: normal + ETT  Neck: supple. JVD to jaw. + rt IJ CVC,  Carotids 2+ bilat; no bruits. No lymphadenopathy or thyromegaly appreciated. Cor: PMI nondisplaced. Irregularly irregular rhythm + tachy rate. 3/6 MR murmur  Lungs: intubated and clear Abdomen: soft, nontender, nondistended. No hepatosplenomegaly. No bruits or masses. Good bowel sounds. Extremities: no cyanosis, clubbing, rash, edema Neuro: intubated, awake on vent, intermittently follows commands    Telemetry   Afib w/ RVR 160s Personally reviewed  Labs    CBC Recent Labs    02/13/22 0345 02/13/22 0427 02/14/22 0441  WBC 17.7*  --  16.7*  HGB 10.5* 11.2* 11.4*  HCT 34.5* 33.0* 36.0  MCV 92.5  --  89.1  PLT 247  --  283   Basic Metabolic Panel Recent Labs    02/11/22 1501 02/12/22 0339 02/13/22 0345 02/13/22 0427 02/13/22 2245 02/14/22 0441  NA 141   < > 141 142 135  --   K 4.1   < > 4.5 4.1 4.4  --   CL 106   < > 108  --  99  --   CO2 26   < > 26  --  27  --   GLUCOSE 142*   < > 170*  --  151*  --   BUN 16   < > 22*  --  21*  --   CREATININE 1.51*   < >  1.56*  --  1.42*  --   CALCIUM 8.0*   < > 8.1*  --  8.1*  --   MG 2.2   < > 1.9  --  2.1 2.0  PHOS 3.2  --   --   --   --   --    < > = values in this interval not displayed.   Liver Function Tests No results for input(s): AST, ALT, ALKPHOS, BILITOT, PROT, ALBUMIN in the last 72 hours.  No results for input(s): LIPASE, AMYLASE in the last 72 hours. Cardiac Enzymes No results for input(s): CKTOTAL, CKMB, CKMBINDEX, TROPONINI in the last 72 hours.  BNP: BNP (last 3 results) Recent Labs    07/02/21 1335 02/08/22 0351  BNP 1,157.2* 709.9*    ProBNP (last 3 results) No results for input(s): PROBNP in the last 8760 hours.   D-Dimer No results for input(s): DDIMER in the last 72 hours. Hemoglobin A1C No results for input(s): HGBA1C in the last 72 hours. Fasting Lipid Panel Recent Labs    02/14/22 0441  TRIG 151*   Thyroid Function Tests No results for input(s): TSH, T4TOTAL, T3FREE, THYROIDAB in  the last 72 hours.  Invalid input(s): FREET3  Other results:   Imaging    No results found.   Medications:     Scheduled Medications:  chlorhexidine gluconate (MEDLINE KIT)  15 mL Mouth Rinse BID   Chlorhexidine Gluconate Cloth  6 each Topical Daily   docusate  100 mg Per Tube BID   fentaNYL (SUBLIMAZE) injection  50 mcg Intravenous Once   insulin aspart  2-6 Units Subcutaneous Q4H   labetalol  20 mg Intravenous Once   mouth rinse  15 mL Mouth Rinse 10 times per day   pantoprazole sodium  40 mg Per Tube Daily   polyethylene glycol  17 g Per Tube Daily   potassium chloride  40 mEq Per Tube BID   sodium chloride flush  3 mL Intravenous Q12H   spironolactone  12.5 mg Per Tube Daily    Infusions:  amiodarone 60 mg/hr (02/14/22 0656)   dexmedetomidine (PRECEDEX) IV infusion Stopped (02/13/22 1641)   feeding supplement (VITAL AF 1.2 CAL) 1,000 mL (02/14/22 0544)   fentaNYL infusion INTRAVENOUS 300 mcg/hr (02/14/22 0618)   heparin 1,100 Units/hr (02/14/22 0600)   midazolam Stopped (02/13/22 0835)   milrinone 0.125 mcg/kg/min (02/14/22 0600)   norepinephrine (LEVOPHED) Adult infusion 16 mcg/min (02/14/22 0600)   piperacillin-tazobactam (ZOSYN)  IV 12.5 mL/hr at 02/14/22 0600   propofol (DIPRIVAN) infusion 15 mcg/kg/min (02/14/22 0600)    PRN Medications: acetaminophen, fentaNYL, midazolam, ondansetron (ZOFRAN) IV    Patient Profile   56 y/o woman with severe MR and polysubstance abuse Lost to f/u. Admitted with AFL with RVR -> cardiogenic shock  Assessment/Plan   1. Shock, cardiogenic - in setting of severe MR and flash pulmonary edema. Lactic acid 2.6>1.9  - Now with fever, elevated PCT suspect PNA as well and may be component of septic shock. - CO-OX 76% on milrinone 0.125 mcg. Off NE  - CVP 15. Continue IV Lasix 80 mg x 1  - Continue milrinone at current dose.   2. PEA Arrest -ACLS x1 min with ROSC -Intubated. Placed on pressors.   3.  Acute Hypoxic  Respiratory Failure due to flash pulmonary edema/PNA -Urgent intubation 5/17 -FiO2 down to 40%.  -Green sputum with fever, leukocytosis, PCT 4 => suspect PNA. Zosyn started.  -Respiratory Cx + Group C Strep. Continue Zosyn  4.  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  - Seen by TCTS. Plan MVR once recovered   5 . PAF/SVT - A flutter on admit. Spontaneously converted to SR.  - Repeat DC-CV for rapid SVT 5/19.  - She went into AF with RVR 5/20 early am, rate in the 140s-160s.  DCCV attempted again on 5/20 but failed. - Remains in Rapid Afib 160s, failed multiple Amio boluses  - rhythm control difficult w/ severe MR/severe LAE. ? MAZE at time of MVR - Continue amiodarone 60 mg/hr today.  - Continue heparin gtt.  - may need to stop milrinone    6.  AKI due to ATN/shock -Creatinine 0.96--->1.8 -> 1.49 -> 1.34 -> 1.64->1.56->pending -Avoid hypotension.    7. Tobacco/Polysubstance abuse  - tox screen +cocaine/THC  8. Hypokalemia  - BMP pending  - supp PRN   9. Anxiety - severe   10. ID - Suspect PNA - CXR with bibasilar opacities.  - Febrile with PCT 4, WBCs 17.7, green sputum => started Zosyn.  - Respiratory Cx + Group C Strep. Continue Zosyn   Length of Stay: [address], PA-C  02/14/2022, 7:38 AM  Advanced Heart Failure Team Pager [phone] (M-F; 7a - 5p)  Please contact [facility] for night-coverage after hours (5p -7a ) and weekends on [email or web address]  Agree with above.   Remains extremely tenuous.   Extubated this am. Remains lethargic but can follow commands. Back in AF with RVR despite multiple doses of IV amio.   + significant yellow pulmonary secretions. SBP 160-180. Co-ox 76%  K 6.0  General:  Lethargic but follows commands. Tachypneic HEENT: normal Neck: supple. JVP to jaw Carotids 2+ bilat; no bruits. No  lymphadenopathy or thryomegaly appreciated. Cor: PMI nondisplaced. Irregular tachy  2/6 MR Lungs: diffuse rhonchi  Abdomen: soft, nontender, nondistended. No hepatosplenomegaly. No bruits or masses. Good bowel sounds. Extremities: no cyanosis, clubbing, rash, edema Neuro: lethargic but follows commands  Will give lasix. Stop milrinone. Careful trail of dilt drip. Continue IV amio as well. Place BIPAP. Low threshold to expand. Low bar to reintubate.   D/w CCM and patient's daughter at bedside.   CRITICAL CARE Performed by: Glori Bickers  Total critical care time: 35 minutes  Critical care time was exclusive of separately billable procedures and treating other patients.  Critical care was necessary to treat or prevent imminent or life-threatening deterioration.  Critical care was time spent personally by me (independent of midlevel providers or residents) on the following activities: development of treatment plan with patient and/or surrogate as well as nursing, discussions with consultants, evaluation of patient's response to treatment, examination of patient, obtaining history from patient or surrogate, ordering and performing treatments and interventions, ordering and review of laboratory studies, ordering and review of radiographic studies, pulse oximetry and re-evaluation of patient's condition.  Glori Bickers, MD  9:25 AM

## 2022-02-14 NOTE — Progress Notes (Signed)
NAME:  Lisa Ayala, MRN:  [id number], DOB:  Oct 15, 1965, LOS: 7 ADMISSION DATE:  02/07/2022, CONSULTATION DATE:  5/17 REFERRING MD:  Dr. Gardiner Rhyme, CHIEF COMPLAINT:  hypoxia   History of Present Illness:  56 year old female with PMH as below, which is significant for severe MR, moderate TR, and SVT. She had an episode of SVT in October of 22 and as part of her cardiology follow up she was noted to have murmur. Echo showed extensive valvular disease including wide open MR. She unfortunately missed her scheduled TEE. Presented to EP clinic 5/16 with complaints of palpitations and dyspnea. She was found to be in a rapid atrial rhythm and was sent to ED for further evaluation. In the ED she was worked up for SVT and had several rounds of adenosine given with some evidence of atrial flutter when rate slowed. She was started on heparin for flutter and was planned for TEE/DCCV. Prior to the case beginning on 5/17 she rapidly declined with progressive dyspnea, flash pulmonary edema, and hypoxia requiring intubation. Briefly suffered asystolic arrest during intubation. PCCM asked to evaluate for vent management.   Pertinent  Medical History   has a past medical history of Aortic insufficiency, PSVT (paroxysmal supraventricular tachycardia) (HCC), Severe mitral regurgitation, and Tricuspid regurgitation.  Significant Hospital Events: Including procedures, antibiotic start and stop dates in addition to other pertinent events   5/16 admit for A flutter 5/17 decompensated in endoscopy prior to TEE. Intubated. Brief asystolic arrest.  0000000 SVT requiring emergent cardioversion  Interim History / Subjective:  Still in and out of Afib/RVR Diuresed pretty well Having hypertension with above Starting to follow some commands  Objective   Blood pressure 130/83, pulse (!) 155, temperature 99 F (37.2 C), temperature source Oral, resp. rate (!) 33, weight 70.7 kg, SpO2 99 %. CVP:  [3 mmHg-4 mmHg] 3 mmHg  Vent  Mode: PSV;CPAP FiO2 (%):  [40 %] 40 % Set Rate:  [20 bmp] 20 bmp Vt Set:  [490 mL] 490 mL PEEP:  [5 cmH20] 5 cmH20 Pressure Support:  [10 [id number] cmH20] 10 cmH20 Plateau Pressure:  [15 cmH20-17 cmH20] 15 cmH20   Intake/Output Summary (Last 24 hours) at 02/14/2022 0834 Last data filed at 02/14/2022 0744 Gross per 24 hour  Intake 3367.24 ml  Output 4740 ml  Net -1372.76 ml    Filed Weights   02/12/22 0500 02/13/22 0500 02/14/22 0500  Weight: 76.4 kg 76.3 kg 70.7 kg    Examination: No distress Not moving upper arms, moves toes Twitching of eyelids noted stable Ext no edema Heart with +murmur, tachycardic  WBC down a bit Tracheal aspirate group C strep No new imaging   Resolved Hospital Problem list     Assessment & Plan:   Acute hypoxemic respiratory failure secondary to flash pulmonary edema in the setting of severe MR.  Possible HAP Cardiogenic vs septic shock resolving Severe MR due to rheumatic heart dz needs eventual surgery if can get stronger Brief PEA arrest 5/17 Atrial flutter with RVR: CHA2DS2VASc score 1 SVT: rates as high as 200  ongoing despite multiple shocks and multiple amio bolus, likely related to severe MR, agitation AKI vs. CKD stable Substance abuse: THC and cocaine on UDS  - Vent support, VAP prevention bundle - Would push fluid removal - ?maybe remove milrinone to reduce SA stimulation - Levophed titrated to MAP 65 - Continue zosyn x 7 days - Continue amio and heparin drip, ?dilt or labetalol, will discuss with CHF team -  Will attempt extubation today, if fails will need to push more diuresis and/or consider tracheostomy in upcoming days depending on [place] (right click and "Reselect all SmartList Selections" daily)   Diet/type: NPO; TF DVT prophylaxis: systemic heparin GI prophylaxis: PPI Lines: Central line and Arterial Line Foley:  Yes, and it is still needed Code Status:  full code Last date of multidisciplinary goals  of care discussion: family updated at bedside   Critical care time: 63 minutes    Erskine Emery MD PCCM

## 2022-02-14 NOTE — Progress Notes (Signed)
2230: Patient went into A. Fib RVR 150s-170s. E-Link Physician notified. Orders received for 150 mg Amiodarone bolus.   0045: Patient continuing A. Fib RVR rhythm with elevated a-line BP. MD notified. Orders received for 150 mg Amiodarone bolus.   5638: Patient continuing A. Fib RVR with elevating A-line BP (sys 200s). Cards fellow notified. Orders received for 150 mg Amiodarone bolus.

## 2022-02-14 NOTE — Progress Notes (Signed)
ANTICOAGULATION CONSULT NOTE - Follow Up Consult  Pharmacy Consult for heparin  Indication: atrial fibrillation  No Known Allergies  Patient Measurements: Weight: 70.7 kg (155 lb 13.8 oz) (with 1 pillow and top sheet) Heparin Dosing Weight: 68.1 kg  Vital Signs: Temp: 99 F (37.2 C) (05/23 0726) Temp Source: Oral (05/23 0726) BP: 157/98 (05/23 0600) Pulse Rate: 142 (05/23 0600)  Labs: Recent Labs    02/12/22 0339 02/13/22 0345 02/13/22 0427 02/13/22 2245 02/14/22 0441  HGB 11.2* 10.5* 11.2*  --  11.4*  HCT 36.4 34.5* 33.0*  --  36.0  PLT 247 247  --   --  338  HEPARINUNFRC 0.58 0.26*  --   --  0.39  CREATININE 1.64* 1.56*  --  1.42*  --      Estimated Creatinine Clearance: 42.5 mL/min (A) (by C-G formula based on SCr of 1.42 mg/dL (H)).  Assessment: 17 yof with a history of SVT, mitral regurg TR, AI, anxiety. Patient is presenting with tachycardia. Heparin per pharmacy consult placed for atrial fibrillation. Patient is not on anticoagulation prior to arrival.   Heparin drip 1100 units/hr with heparin level 0.39 at goal. No bleeding issues noted overnight. CBC stable.   Goal of Therapy:  Heparin level 0.3-0.7 units/ml Monitor platelets by anticoagulation protocol: Yes   Plan:  Continue heparin at 1100 units/hr  Daily heparin level and CBC Monitor for s/sx of bleeding   Erin Hearing PharmD., BCPS Clinical Pharmacist 02/14/2022 7:51 AM

## 2022-02-14 NOTE — Evaluation (Signed)
Clinical/Bedside Swallow Evaluation Patient Details  Name: Lisa Ayala MRN: [id number] Date of Birth: 21-Aug-1966  Today's Date: 02/14/2022 Time: SLP Start Time (ACUTE ONLY): 1450 SLP Stop Time (ACUTE ONLY): 1510 SLP Time Calculation (min) (ACUTE ONLY): 20 min  Past Medical History:  Past Medical History:  Diagnosis Date   Aortic insufficiency    PSVT (paroxysmal supraventricular tachycardia) (HCC)    Severe mitral regurgitation    a. 08/2021 Echo: EF 70-75%, no rwma, mod dil LV, GrII DD, nl RV fxn, massively dil LA, mild-mod dil RA, severe MR due to lack of central coaptation of MV leaflets. Mild MS. Mod TR. Mild-mod AI.   Tricuspid regurgitation    Past Surgical History:  Past Surgical History:  Procedure Laterality Date   RIGHT/LEFT HEART CATH AND CORONARY ANGIOGRAPHY N/A 02/10/2022   Procedure: RIGHT/LEFT HEART CATH AND CORONARY ANGIOGRAPHY;  Surgeon: Dolores Patty, MD;  Location: [facility] INVASIVE CV LAB;  Service: Cardiovascular;  Laterality: N/A;   TEE WITHOUT CARDIOVERSION N/A 02/08/2022   Procedure: TRANSESOPHAGEAL ECHOCARDIOGRAM (TEE);  Surgeon: Sande Rives, MD;  Location: [facility] ENDOSCOPY;  Service: Cardiovascular;  Laterality: N/A;   HPI:  Patient is a 56 y.o. female with PMH: anxiety, SVT, valvular heart disease including severe mitral regurgitation. She presented to the ED on 02/07/22 secondary to 11 day history of persistent tachycardia. Before  the scheduled 5/17 TEE,  patient rapidly declined with progressive dyspnea, flash pulmonary edema, hypoxia requiring intubation; briefly suffered asystolic arrrest during intubation. She was extubated on 02/15/16.    Assessment / Plan / Recommendation  Clinical Impression  Patient presents with clinical s/s of what is suspected to be a post-extubation (7 days long) dysphagia in addition to likely reversible cognitive impairment. When SLP entered room, patient in bed, RN telling SLP that patient's HR and RR have been steady but  in high range (RR 35-40). Patient was awake and alert and followed all commands (basic level and related to oral motor assessment. She voiced when cued to say "ahh" with voice very low in intensity and hoarse. Patient did not attempt to speak (RN reported the same), did not mouth any words, etc but would smile at times when SLP asking her if her name was something incorrect. Currently, patient is not safe for any PO's but SLP will follow for PO readiness and will monitor cognitive function as well. SLP Visit Diagnosis: Dysphagia, unspecified (R13.10)    Aspiration Risk  Moderate aspiration risk;Risk for inadequate nutrition/hydration    Diet Recommendation NPO   Medication Administration: Via alternative means    Other  Recommendations Oral Care Recommendations: Oral care QID;Staff/trained caregiver to provide oral care    Recommendations for follow up therapy are one component of a multi-disciplinary discharge planning process, led by the attending physician.  Recommendations may be updated based on patient status, additional functional criteria and insurance authorization.  Follow up Recommendations Other (comment) (TBD)      Assistance Recommended at Discharge Frequent or constant Supervision/Assistance  Functional Status Assessment Patient has had a recent decline in their functional status and demonstrates the ability to make significant improvements in function in a reasonable and predictable amount of time.  Frequency and Duration min 2x/week  1 week       Prognosis Prognosis for Safe Diet Advancement: Good      Swallow Study   General Date of Onset: 02/07/22 HPI: Patient is a 56 y.o. female with PMH: anxiety, SVT, valvular heart disease including severe mitral regurgitation. She presented  to the ED on 02/07/22 secondary to 11 day history of persistent tachycardia. Before  the scheduled 5/17 TEE,  patient rapidly declined with progressive dyspnea, flash pulmonary edema, hypoxia  requiring intubation; briefly suffered asystolic arrrest during intubation. She was extubated on 02/15/16. Type of Study: Bedside Swallow Evaluation Previous Swallow Assessment: none found Diet Prior to this Study: NPO Temperature Spikes Noted: No Respiratory Status: Nasal cannula History of Recent Intubation: Yes Length of Intubations (days): 7 days Date extubated: 02/14/22 Behavior/Cognition: Alert;Cooperative;Pleasant mood Oral Cavity Assessment: Dry Oral Care Completed by SLP: Yes Oral Cavity - Dentition: Missing dentition;Adequate natural dentition Self-Feeding Abilities: Total assist Patient Positioning: Upright in bed Baseline Vocal Quality: Low vocal intensity;Hoarse Volitional Cough: Cognitively unable to elicit Volitional Swallow: Unable to elicit    Oral/Motor/Sensory Function Overall Oral Motor/Sensory Function: Generalized oral weakness Facial ROM: Within Functional Limits Facial Symmetry: Within Functional Limits Facial Strength: Within Functional Limits Facial Sensation: Within Functional Limits Lingual ROM: Within Functional Limits Lingual Symmetry: Within Functional Limits Lingual Strength: Within Functional Limits   Ice Chips Ice chips: Impaired Oral Phase Impairments: Reduced lingual movement/coordination;Reduced labial seal;Impaired mastication Pharyngeal Phase Impairments: Other (comments) (no swallow initiation observed)   Thin Liquid Thin Liquid: Not tested    Nectar Thick Nectar Thick Liquid: Not tested   Honey Thick Honey Thick Liquid: Not tested   Puree Puree: Not tested   Solid     Solid: Not tested      Angela Nevin, MA, CCC-SLP Speech Therapy

## 2022-02-15 ENCOUNTER — Inpatient Hospital Stay (HOSPITAL_COMMUNITY): Payer: Medicaid Other

## 2022-02-15 DIAGNOSIS — R609 Edema, unspecified: Secondary | ICD-10-CM

## 2022-02-15 DIAGNOSIS — Z01810 Encounter for preprocedural cardiovascular examination: Secondary | ICD-10-CM

## 2022-02-15 LAB — POCT I-STAT 7, (LYTES, BLD GAS, ICA,H+H)
Acid-Base Excess: 10 mmol/L — ABNORMAL HIGH (ref 0.0–2.0)
Bicarbonate: 34.4 mmol/L — ABNORMAL HIGH (ref 20.0–28.0)
Calcium, Ion: 1.21 mmol/L (ref 1.15–1.40)
HCT: 40 % (ref 36.0–46.0)
Hemoglobin: 13.6 g/dL (ref 12.0–15.0)
O2 Saturation: 91 %
Patient temperature: 98.5
Potassium: 3.4 mmol/L — ABNORMAL LOW (ref 3.5–5.1)
Sodium: 135 mmol/L (ref 135–145)
TCO2: 36 mmol/L — ABNORMAL HIGH (ref 22–32)
pCO2 arterial: 42.6 mmHg (ref 32–48)
pH, Arterial: 7.514 — ABNORMAL HIGH (ref 7.35–7.45)
pO2, Arterial: 55 mmHg — ABNORMAL LOW (ref 83–108)

## 2022-02-15 LAB — GLUCOSE, CAPILLARY
Glucose-Capillary: 173 mg/dL — ABNORMAL HIGH (ref 70–99)
Glucose-Capillary: 139 mg/dL — ABNORMAL HIGH (ref 70–99)
Glucose-Capillary: 162 mg/dL — ABNORMAL HIGH (ref 70–99)
Glucose-Capillary: 130 mg/dL — ABNORMAL HIGH (ref 70–99)

## 2022-02-15 LAB — CBC
HCT: 39.4 % (ref 36.0–46.0)
Hemoglobin: 13 g/dL (ref 12.0–15.0)
MCH: 28.4 pg (ref 26.0–34.0)
MCHC: 33 g/dL (ref 30.0–36.0)
MCV: 86 fL (ref 80.0–100.0)
Platelets: 363 10*3/uL (ref 150–400)
RBC: 4.58 MIL/uL (ref 3.87–5.11)
RDW: 18.5 % — ABNORMAL HIGH (ref 11.5–15.5)
WBC: 18.8 10*3/uL — ABNORMAL HIGH (ref 4.0–10.5)
nRBC: 0.5 % — ABNORMAL HIGH (ref 0.0–0.2)

## 2022-02-15 LAB — COOXEMETRY PANEL
Carboxyhemoglobin: 1.8 % — ABNORMAL HIGH (ref 0.5–1.5)
Methemoglobin: <0.7 % (ref 0.0–1.5)
O2 Saturation: 77.8 %
Total hemoglobin: 13.9 g/dL (ref 12.0–16.0)

## 2022-02-15 LAB — BASIC METABOLIC PANEL
Anion gap: 13 (ref 5–15)
BUN: 29 mg/dL — ABNORMAL HIGH (ref 6–20)
CO2: 29 mmol/L (ref 22–32)
Calcium: 9 mg/dL (ref 8.9–10.3)
Chloride: 95 mmol/L — ABNORMAL LOW (ref 98–111)
Creatinine, Ser: 1.38 mg/dL — ABNORMAL HIGH (ref 0.44–1.00)
GFR, Estimated: 45 mL/min — ABNORMAL LOW (ref >60)
Glucose, Bld: 173 mg/dL — ABNORMAL HIGH (ref 70–99)
Potassium: 3.4 mmol/L — ABNORMAL LOW (ref 3.5–5.1)
Sodium: 137 mmol/L (ref 135–145)

## 2022-02-15 LAB — HEPARIN LEVEL (UNFRACTIONATED): Heparin Unfractionated: 0.3 IU/mL (ref 0.30–0.70)

## 2022-02-15 LAB — PROCALCITONIN: Procalcitonin: 4.97 ng/mL

## 2022-02-15 LAB — MAGNESIUM: Magnesium: 2.3 mg/dL (ref 1.7–2.4)

## 2022-02-15 LAB — AMMONIA: Ammonia: 14 umol/L (ref 9–35)

## 2022-02-15 MED ORDER — METOCLOPRAMIDE HCL 5 MG/5ML PO SOLN
10.0000 mg | Freq: Three times a day (TID) | ORAL | Status: DC
  Filled 2022-02-15: qty 10

## 2022-02-15 MED ORDER — PANTOPRAZOLE SODIUM 40 MG IV SOLR
40.0000 mg | Freq: Every day | INTRAVENOUS | Status: DC
  Administered 2022-02-15 – 2022-02-19 (×5): 40 mg via INTRAVENOUS
  Filled 2022-02-15 (×5): qty 10

## 2022-02-15 MED ORDER — INSULIN ASPART 100 UNIT/ML IJ SOLN
0.0000 [IU] | INTRAMUSCULAR | Status: DC
  Administered 2022-02-15: 2 [IU] via SUBCUTANEOUS
  Administered 2022-02-15: 3 [IU] via SUBCUTANEOUS
  Administered 2022-02-16 – 2022-02-20 (×3): 2 [IU] via SUBCUTANEOUS

## 2022-02-15 MED ORDER — PANTOPRAZOLE SODIUM 40 MG PO TBEC
40.0000 mg | DELAYED_RELEASE_TABLET | Freq: Every day | ORAL | Status: DC
Start: 2022-02-15 — End: 2022-02-15

## 2022-02-15 MED ORDER — LACTULOSE 10 GM/15ML PO SOLN: 20.0000 g | Freq: Once | ORAL | Status: DC

## 2022-02-15 MED ORDER — SPIRONOLACTONE 12.5 MG HALF TABLET
12.5000 mg | ORAL_TABLET | Freq: Every day | ORAL | Status: DC
Start: 2022-02-15 — End: 2022-02-15

## 2022-02-15 MED ORDER — DOCUSATE SODIUM 50 MG/5ML PO LIQD
100.0000 mg | Freq: Two times a day (BID) | ORAL | Status: DC
  Administered 2022-02-15 – 2022-02-19 (×9): 100 mg
  Filled 2022-02-15 (×12): qty 10

## 2022-02-15 MED ORDER — POLYETHYLENE GLYCOL 3350 17 G PO PACK
17.0000 g | PACK | Freq: Every day | ORAL | Status: DC
  Administered 2022-02-15 – 2022-02-19 (×5): 17 g
  Filled 2022-02-15 (×6): qty 1

## 2022-02-15 MED ORDER — POLYETHYLENE GLYCOL 3350 17 G PO PACK: 17.0000 g | PACK | Freq: Every day | ORAL | Status: DC

## 2022-02-15 MED ORDER — METOCLOPRAMIDE HCL 5 MG/ML IJ SOLN
INTRAMUSCULAR | Status: AC
  Administered 2022-02-15: 10 mg via INTRAVENOUS
  Filled 2022-02-15: qty 2

## 2022-02-15 MED ORDER — METOCLOPRAMIDE HCL 5 MG/ML IJ SOLN
10.0000 mg | Freq: Three times a day (TID) | INTRAMUSCULAR | Status: DC
  Administered 2022-02-15 – 2022-02-17 (×5): 10 mg via INTRAVENOUS
  Filled 2022-02-15 (×5): qty 2

## 2022-02-15 MED ORDER — DOCUSATE SODIUM 100 MG PO CAPS: 100.0000 mg | ORAL_CAPSULE | Freq: Two times a day (BID) | ORAL | Status: DC

## 2022-02-15 MED ORDER — METOCLOPRAMIDE HCL 10 MG PO TABS
10.0000 mg | ORAL_TABLET | Freq: Three times a day (TID) | ORAL | Status: DC
  Filled 2022-02-15 (×2): qty 1

## 2022-02-15 MED ORDER — AMIODARONE LOAD VIA INFUSION
150.0000 mg | Freq: Once | INTRAVENOUS | Status: AC
  Administered 2022-02-15: 150 mg via INTRAVENOUS
  Filled 2022-02-15: qty 83.34

## 2022-02-15 MED ORDER — BISACODYL 10 MG RE SUPP: 10.0000 mg | Freq: Once | RECTAL | Status: DC | PRN

## 2022-02-15 MED ORDER — LACTULOSE 10 GM/15ML PO SOLN
30.0000 g | Freq: Once | ORAL | Status: AC
  Administered 2022-02-15: 30 g
  Filled 2022-02-15: qty 45

## 2022-02-15 MED ORDER — POTASSIUM CHLORIDE 10 MEQ/50ML IV SOLN
10.0000 meq | INTRAVENOUS | Status: AC
  Administered 2022-02-15 (×4): 10 meq via INTRAVENOUS
  Filled 2022-02-15 (×4): qty 50

## 2022-02-15 NOTE — Progress Notes (Signed)
ANTICOAGULATION CONSULT NOTE - Follow Up Consult  Pharmacy Consult for heparin  Indication: atrial fibrillation  No Known Allergies  Patient Measurements: Weight: 64.4 kg (141 lb 15.6 oz) Heparin Dosing Weight: 68.1 kg  Vital Signs: Temp: 98.4 F (36.9 C) (05/24 0400) Temp Source: Oral (05/24 0400) BP: 149/88 (05/24 0600) Pulse Rate: 79 (05/24 0600)  Labs: Recent Labs    02/13/22 0345 02/13/22 0427 02/13/22 2245 02/14/22 0441 02/14/22 0809 02/14/22 1250 02/15/22 0336  HGB 10.5* 11.2*  --  11.4*  --   --  13.0  HCT 34.5* 33.0*  --  36.0  --   --  39.4  PLT 247  --   --  338  --   --  363  HEPARINUNFRC 0.26*  --   --  0.39  --   --  0.30  CREATININE 1.56*  --    < >  --  1.61* 1.49* 1.38*   < > = values in this interval not displayed.     Estimated Creatinine Clearance: 43.7 mL/min (A) (by C-G formula based on SCr of 1.38 mg/dL (H)).  Assessment: 59 yof with a history of SVT, mitral regurg TR, AI, anxiety. Patient is presenting with tachycardia. Heparin per pharmacy consult placed for atrial fibrillation. Patient is not on anticoagulation prior to arrival.   Heparin drip 1100 units/hr with heparin level 0.3 at goal. No bleeding issues noted overnight. CBC stable.  In/out Afib on amiodarone drip   Goal of Therapy:  Heparin level 0.3-0.7 units/ml Monitor platelets by anticoagulation protocol: Yes   Plan:  Continue heparin at 1100 units/hr  Daily heparin level and CBC Monitor for s/sx of bleeding    Leota Sauers Pharm.D. CPP, BCPS Clinical Pharmacist [phone] 02/15/2022 7:38 AM

## 2022-02-15 NOTE — Progress Notes (Addendum)
Patient ID: Lisa Ayala, female   DOB: [birth year], 56 y.o.   MRN: [id number]     Advanced Heart Failure Rounding Note  PCP-Cardiologist: Will Meredith Leeds, MD   Subjective:    - 5/17 Flash pulmonary edema. PEA arrest. Intubated. ACLS 1-2 min. ROSC. Placed on milrinone, norepi, and lasix drip.  - 5/19 LHC/RHC: Nonobstructive CAD. RA 9, PA 50/10, mean PCWP 12, CI 6.8.  Rapid SVT, underwent emergent DCCV.  - 5/20: Recurrent AF with RVR, DCCV attempted x 3 but unable to hold NSR.  - 5/21: Spontaneous conversion to NSR - 5/22: Back in AF with RVR - 05/23: Extubated. Milrinone off. Dilt gtt + amio gtt for AF w/ RVR.   On Zosyn for PNA. Respiratory culture grew Group C Strep. BC NGTD. AF.   Converted to SR yesterday evening around 5 pm, back in AF with RVR around 7 am after an episode of vomiting. Rates 120s-140s.  Continues on diltiazem gtt at 20/hr + amio at 60/hr.   Good diuresis yesterday with IV lasix. CVP 4.  Off BiPAP. Denies dyspnea or pain.   No BP since admit.  RUE swollen near IV site  Echo: 5/18 EF 60-65%, RV mild HK, rheumatic MV, severe MR, Mild AI     Objective:   Weight Range: 64.4 kg Body mass index is 22.24 kg/m.   Vital Signs:   Temp:  [98.4 F (36.9 C)-99.1 F (37.3 C)] 98.7 F (37.1 C) (05/24 0740) Pulse Rate:  [79-168] 79 (05/24 0600) Resp:  [24-43] 24 (05/24 0600) BP: (119-178)/(83-122) 149/88 (05/24 0600) SpO2:  [89 %-100 %] 100 % (05/24 0600) Arterial Line BP: (115-232)/(65-129) 139/67 (05/24 0600) FiO2 (%):  [40 %-60 %] 60 % (05/23 1000) Weight:  [64.4 kg] 64.4 kg (05/24 0500) Last BM Date : 02/10/22  Weight change: Filed Weights   02/13/22 0500 02/14/22 0500 02/15/22 0500  Weight: 76.3 kg 70.7 kg 64.4 kg    Intake/Output:   Intake/Output Summary (Last 24 hours) at 02/15/2022 0742 Last data filed at 02/15/2022 0600 Gross per 24 hour  Intake 1669.88 ml  Output 4560 ml  Net -2890.12 ml      Physical Exam   CVP 4 General:  No  distress. Sitting up in bed. HEENT: normal Neck: supple. no JVD. Carotids 2+ bilat; no bruits. L IJ CVC Cor: PMI nondisplaced. irregular rate & rhythm. No rubs, gallops, 3/6 MR murmur Lungs: clear, O2 stable on 4L [place] Abdomen: soft, nontender, nondistended. No hepatosplenomegaly. No bruits or masses. Good bowel sounds. Extremities: no cyanosis, clubbing, rash, edema Neuro: Awake. Following commands. Speaking in short sentences, hoarse.    Telemetry   Afib rates 120s-140s  Labs    CBC Recent Labs    02/14/22 0441 02/15/22 0336  WBC 16.7* 18.8*  HGB 11.4* 13.0  HCT 36.0 39.4  MCV 89.1 86.0  PLT 338 [id number]   Basic Metabolic Panel Recent Labs    02/14/22 0441 02/14/22 0809 02/14/22 1250 02/15/22 0336  NA  --    < > 137 137  K  --    < > 4.8 3.4*  CL  --    < > 96* 95*  CO2  --    < > 27 29  GLUCOSE  --    < > 179* 173*  BUN  --    < > 26* 29*  CREATININE  --    < > 1.49* 1.38*  CALCIUM  --    < > 8.6* 9.0  MG 2.0  --   --  2.3   < > = values in this interval not displayed.   Liver Function Tests No results for input(s): AST, ALT, ALKPHOS, BILITOT, PROT, ALBUMIN in the last 72 hours.  No results for input(s): LIPASE, AMYLASE in the last 72 hours. Cardiac Enzymes No results for input(s): CKTOTAL, CKMB, CKMBINDEX, TROPONINI in the last 72 hours.  BNP: BNP (last 3 results) Recent Labs    07/02/21 1335 02/08/22 0351  BNP 1,157.2* 709.9*    ProBNP (last 3 results) No results for input(s): PROBNP in the last 8760 hours.   D-Dimer No results for input(s): DDIMER in the last 72 hours. Hemoglobin A1C No results for input(s): HGBA1C in the last 72 hours. Fasting Lipid Panel Recent Labs    02/14/22 0441  TRIG 151*   Thyroid Function Tests No results for input(s): TSH, T4TOTAL, T3FREE, THYROIDAB in the last 72 hours.  Invalid input(s): FREET3  Other results:   Imaging    No results found.   Medications:     Scheduled Medications:  chlorhexidine   15 mL Mouth Rinse BID   Chlorhexidine Gluconate Cloth  6 each Topical Daily   docusate  100 mg Per Tube BID   fentaNYL (SUBLIMAZE) injection  50 mcg Intravenous Once   insulin aspart  2-6 Units Subcutaneous Q4H   mouth rinse  15 mL Mouth Rinse q12n4p   pantoprazole sodium  40 mg Per Tube Daily   polyethylene glycol  17 g Per Tube Daily   sodium chloride flush  3 mL Intravenous Q12H   spironolactone  12.5 mg Per Tube Daily    Infusions:  amiodarone 60 mg/hr (02/15/22 0600)   dexmedetomidine (PRECEDEX) IV infusion Stopped (02/14/22 0842)   diltiazem (CARDIZEM) infusion 20 mg/hr (02/15/22 0600)   heparin 1,100 Units/hr (02/15/22 0600)   piperacillin-tazobactam (ZOSYN)  IV 12.5 mL/hr at 02/15/22 0600   potassium chloride 10 mEq (02/15/22 0655)    PRN Medications: acetaminophen, fentaNYL, hydrALAZINE, ondansetron (ZOFRAN) IV    Patient Profile   56 y/o woman with severe MR and polysubstance abuse Lost to f/u. Admitted with AFL with RVR -> cardiogenic shock  Assessment/Plan   1. Shock, cardiogenic - in setting of severe MR and flash pulmonary edema. Lactic acid 2.6>1.9  - Now with fever, elevated PCT suspect PNA as well and may be component of septic shock. - CO-OX stable 78% off milrinone.  - CVP 4 today. Brisk diuresis yesterday with IV lasix.   2. PEA Arrest -ACLS x1 min with ROSC -Intubated. Placed on pressors.   3.  Acute Hypoxic Respiratory Failure due to flash pulmonary edema/PNA -Urgent intubation 5/17, extubated 05/23. O2 stable 3L [place] -Green sputum with fever, leukocytosis. PCT 4 => suspect PNA. Zosyn started. WBC 16.5>17>18.8, follow. -Respiratory Cx + Group C Strep. Continue Zosyn.   4.  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  - Seen by TCTS. Plan MVR once recovered from acute illness   5 . PAF/SVT - A flutter on admit.  Spontaneously converted to SR.  - Repeat DC-CV for rapid SVT 5/19.  - She went into AF with RVR 5/20 early am, rate in the 140s-160s.  DCCV attempted again on 5/20 but failed. - Converted to SR yesterday with amio at 60/hr + diltiazem 20/hr. Back in AF with RVR this  am after episode of emesis. Rebolus with IV amio. - rhythm control difficult w/ severe MR/severe LAE. ? MAZE at time of MVR.  - Continue heparin gtt.  6.  AKI due to ATN/shock -Creatinine 0.96--->1.8 -> 1.49 -> 1.34 -> 1.64->1.5 ->1.4 -Avoid hypotension.   7. Tobacco/Polysubstance abuse  - tox screen +cocaine/THC  8. Hypokalemia  - K 3.4.  - Supp today.  9. Anxiety - severe   10. ID - Suspect PNA - CXR with bibasilar opacities.  - Febrile with PCT 4, WBCs 17.7, green sputum => started Zosyn.  - Respiratory Cx + Group C Strep. Continue Zosyn  11. GI - Episode emesis this am. Primary contents TF.  - No BM since admit - ? Ileus. CCM checking KUB.  12. Hypertension - BP improving after diltiazem gtt added for AF with RVR - Currently NPO. Can give IV hydralazine PRN.  13. RUE swelling - Venous duplex   Length of Stay: 8  [facility], [place], PA-C  02/15/2022, 7:42 AM  Advanced Heart Failure Team Pager [phone] (M-F; 7a - 5p)  Please contact [facility] for night-coverage after hours (5p -7a ) and weekends on [email or web address]  Patient seen with PA, agree with the above note.   She is off pressors with stable BP.  She went back into AF with RVR this morning after vomiting.  She is on amiodarone gtt 60 and diltiazem gtt 20 with heparin gtt.    CVP 4, good diuresis yesterday with IV Lasix.  Co-ox 78%. Creatinine mildly higher 1.38.   General: NAD Neck: No JVD, no thyromegaly or thyroid nodule.  Lungs: Clear to auscultation bilaterally with normal respiratory effort. CV: Nondisplaced PMI.  Heart tachy, irregular S1/S2, no S3/S4, 2/6 HSM apex.  No peripheral edema.   Abdomen: Soft, nontender, no  hepatosplenomegaly, no distention.  Skin: Intact without lesions or rashes.  Neurologic: Alert and oriented x 3.  Psych: Normal affect. Extremities: No clubbing or cyanosis.  HEENT: Normal.   CVP 4, hold diuretics today.   Back in AF with RVR, continue amiodarone/dilt/heparin gtts.  Bolus amiodarone.  Replace K.   On Zosyn with Group C Strep PNA.    Severe MR, once she is clinically stabilized will need MV repair + Maze.  TCTS has seen.   CRITICAL CARE Performed by: Loralie Champagne  Total critical care time: 35 minutes  Critical care time was exclusive of separately billable procedures and treating other patients.  Critical care was necessary to treat or prevent imminent or life-threatening deterioration.  Critical care was time spent personally by me on the following activities: development of treatment plan with patient and/or surrogate as well as nursing, discussions with consultants, evaluation of patient's response to treatment, examination of patient, obtaining history from patient or surrogate, ordering and performing treatments and interventions, ordering and review of laboratory studies, ordering and review of radiographic studies, pulse oximetry and re-evaluation of patient's condition.  Loralie Champagne 02/15/2022 8:55 AM

## 2022-02-15 NOTE — Progress Notes (Signed)
Nutrition Follow-up  DOCUMENTATION CODES:   Non-severe (moderate) malnutrition in context of chronic illness  INTERVENTION:   Tube Feeding via Cortrak: If no further episodes of emesis, recommend trial of trickle TF. PCCM wants to hold TF for today, reassess tomorrow Vital AF 1.2 at 20 ml/hr when able Goal Regimen: Vital AF 1.2 at 60 ml/hr This provide 1728 kcals, 108 g of protein and 1166 mL of free water  NUTRITION DIAGNOSIS:   Moderate Malnutrition related to chronic illness as evidenced by mild fat depletion, mild muscle depletion.  Continues but being addressed  GOAL:   Patient will meet greater than or equal to 90% of their needs  Not met but being addressed  MONITOR:   TF tolerance, Vent status, Labs, Weight trends  REASON FOR ASSESSMENT:   Ventilator, Consult Enteral/tube feeding initiation and management  ASSESSMENT:   56 yo female admitted with acute respiratory failure, acute pulmonary edema, brief cardiac arrest. PMH includes aortic insufficiency, severe MR, tricuspid regurgitation  5/16 Admitted 5/17 Decompensated in Endo prior to TEE, Intubated, Brief arrest 5/19 SVT requiring emergent cardioversion, TF initiated 5/23 Extubated, TF off 5/24 Large emesis, Abd xray negative, Cortrak placed  Discussed placement of Cortrak with PCCM given pt remains NPO per SLP recommendations. PCCM agreeable  Pt with large emesis today, pt had been off TF since yesterday with extubation. Abd xray obtained with no acute abnormalities. Pt is constipated with no BM since admission. Discussed possible attempt at trickle TF today with PCCM; PCCM wanting to hold today and reassess tomorrow  Discussed possibility of short term use reglan/erythromycin to promote motility; PCCM ordered later today  Current wt 64.4 kg; admit weight 69 kg. Net negative 2L  Labs: reviewed Meds: lactulose, miralax, colace    Diet Order:   Diet Order             Diet NPO time specified  Diet  effective midnight                   EDUCATION NEEDS:   Not appropriate for education at this time  Skin:  Skin Assessment: Reviewed RN Assessment  Last BM:  PTA  Height:   Ht Readings from Last 1 Encounters:  02/07/22 5' 7"  (1.702 m)    Weight:   Wt Readings from Last 1 Encounters:  02/15/22 64.4 kg     BMI:  Body mass index is 22.24 kg/m.  Estimated Nutritional Needs:   Kcal:  2241-1464 kcals  Protein:  85-105 g  Fluid:  >/= 1.7 L   Kerman Passey MS, RDN, LDN, CNSC Registered Dietitian III Clinical Nutrition RD Pager and On-Call Pager Number Located in [place]

## 2022-02-15 NOTE — Progress Notes (Signed)
Inpatient Rehab Admissions Coordinator Note:   Per PT recommendations patient was screened for CIR candidacy by Stephania Fragmin, PT. At this time, pt appears to be a potential candidate for CIR. I will place an order for rehab consult for full assessment, per our protocol.  Please contact me any with questions.Estill Dooms, PT, DPT [phone] 02/15/22 3:44 PM

## 2022-02-15 NOTE — Progress Notes (Signed)
NAME:  Lisa Ayala, MRN:  [id number], DOB:  19-Oct-1965, LOS: 8 ADMISSION DATE:  02/07/2022, CONSULTATION DATE:  5/17 REFERRING MD:  Dr. Bjorn Pippin, CHIEF COMPLAINT:  hypoxia   History of Present Illness:  56 year old female with PMH as below, which is significant for severe MR, moderate TR, and SVT. She had an episode of SVT in October of 22 and as part of her cardiology follow up she was noted to have murmur. Echo showed extensive valvular disease including wide open MR. She unfortunately missed her scheduled TEE. Presented to EP clinic 5/16 with complaints of palpitations and dyspnea. She was found to be in a rapid atrial rhythm and was sent to ED for further evaluation. In the ED she was worked up for SVT and had several rounds of adenosine given with some evidence of atrial flutter when rate slowed. She was started on heparin for flutter and was planned for TEE/DCCV. Prior to the case beginning on 5/17 she rapidly declined with progressive dyspnea, flash pulmonary edema, and hypoxia requiring intubation. Briefly suffered asystolic arrest during intubation. PCCM asked to evaluate for vent management.   Pertinent  Medical History   has a past medical history of Aortic insufficiency, PSVT (paroxysmal supraventricular tachycardia) (HCC), Severe mitral regurgitation, and Tricuspid regurgitation.  Significant Hospital Events: Including procedures, antibiotic start and stop dates in addition to other pertinent events   5/16 admit for A flutter 5/17 decompensated in endoscopy prior to TEE. Intubated. Brief asystolic arrest.  5/19 SVT requiring emergent cardioversion 5/24 No major issues overnight, remains in A-fib RVR despite amio and Dilt drips   Interim History / Subjective:  Awake and slightly interactive this am  Voice low with flat affect   Objective   Blood pressure (!) 149/88, pulse 79, temperature 98.7 F (37.1 C), temperature source Axillary, resp. rate (!) 24, weight 64.4 kg, SpO2 100  %. CVP:  [3 mmHg] 3 mmHg  Vent Mode: BIPAP;PCV FiO2 (%):  [60 %] 60 % Set Rate:  [10 bmp] 10 bmp PEEP:  [6 cmH20] 6 cmH20   Intake/Output Summary (Last 24 hours) at 02/15/2022 0827 Last data filed at 02/15/2022 0600 Gross per 24 hour  Intake 1532.45 ml  Output 4230 ml  Net -2697.55 ml    Filed Weights   02/13/22 0500 02/14/22 0500 02/15/22 0500  Weight: 76.3 kg 70.7 kg 64.4 kg    Examination: General: Acute on chronically ill appearing middle aged female lying in bed, in NAD HEENT: [place]/ATT, MM pink/moist, PERRL,  Neuro: Awake and able to follow simple commands but flat affect  CV: s1s2 regular rate and rhythm, no murmur, rubs, or gallops,  PULM:  Diminished breath sounds bilaterally, no increased work of breathing, oxygen sats appropriate on 4L [place] GI: soft, bowel sounds active in all 4 quadrants, non-tender, non-distended, tolerating TF Extremities: warm/dry, no edema  Skin: no rashes or lesions  Resolved Hospital Problem list     Assessment & Plan:   Acute hypoxemic respiratory failure secondary to flash pulmonary edema in the setting of severe MR.  Possible HAP P: Wean PEEP and FiO2 for sats greater than 90% Head of bed elevated 30 degrees. Follow intermittent chest x-ray and ABG Ensure adequate pulmonary hygiene  Follow cultures  Continue empiric Zosyn with stop date in place   Cardiogenic vs septic shock - resolving -COOX 5/24 78% off Milrinone Severe MR due to rheumatic heart dz -Needs eventual surgery if can get stronger Brief PEA arrest 5/17 -ACLS provided x1  min with ROSC achieved  Atrial flutter with RVR -CHA2DS2VASc score 1 SVT -Rates as high as 200  ongoing despite multiple shocks and multiple amio bolus, likely related to severe MR, agitation -Repeat DCCV 5/19 but went back into A-fic RVR 5/20, DCCV re attempted but failured  P: Primary management per heart failure  Continuous telemetry  Continue heparin, amiodarone, and cardizem drip s Strict  intake and output  Daily weight to assess volume status Optimize electrolytes  Closely monitor renal function  Provide supplemtal oxygen as needed to maintain oxygen saturations above 90% Follow CVP  AKI vs. CKD stable -Creatinine 0.96 with GFR 60 08/2021, Peak creatinine during admission 1.77 P: Follow renal function  Monitor urine output Trend Bmet Avoid nephrotoxins Ensure adequate renal perfusion   Substance abuse: THC and cocaine on UDS P: Cessation education when appropriate   Constipation  -No BM documented during admission with one large volume vomiting episode am of 5/24 P: Start aggressive bowle regiment   Best Practice (right click and "Reselect all SmartList Selections" daily)   Diet/type: NPO; TF DVT prophylaxis: systemic heparin GI prophylaxis: PPI Lines: Central line and Arterial Line Foley:  Yes, and it is still needed Code Status:  full code Last date of multidisciplinary goals of care discussion:  Continue to update patient and family daily  Critical care time:   Vanesa Renier D. Kenton Kingfisher, NP-C [facility] Personal contact information can be found on Amion  02/15/2022, 8:40 AM

## 2022-02-15 NOTE — Progress Notes (Signed)
Right upper extremity venous duplex and pre-CABG (MVR) Dopplers completed. Refer to "CV Proc" under chart review to view preliminary results.  02/15/2022 10:15 AM Eula Fried., MHA, RVT, RDCS, RDMS

## 2022-02-15 NOTE — Progress Notes (Signed)
PT Cancellation Note  Patient Details Name: Lisa Ayala MRN: [id number] DOB: [birth year]   Cancelled Treatment:    Reason Eval/Treat Not Completed: Medical issues which prohibited therapy; pt with unstable HR, in addition to acute cognitive changes today, down for head CT. Will follow-up for PT Evaluation as appropriate.  Mabeline Caras, PT, DPT Acute Rehabilitation Services  Pager [phone] Office [place] 02/15/2022, 1:17 PM

## 2022-02-15 NOTE — Evaluation (Signed)
Physical Therapy Evaluation Patient Details Name: Lisa Ayala MRN: [id number] DOB: [birth year] Today's Date: 02/15/2022  History of Present Illness  Pt is a 56 y.o. female admitted 02/08/22 with aflutter. Plan for TEE/DCCV, pt rapidly declined 5/17 with progressive dyspnea, flash pulmonary edema, and hypoxia requiring intubation; pt briefly suffered asystolic arrest during intubation. Pt with SVT 5/19 requiring emergent cardioversion; recurrent AF with RVR on 5/20 with DCCV attempted x3; spontaneous return to NSR 5/21. 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 ETT 5/17-5/23. AMS noted 5/24; head CT negative for acute abnormality, suspicion for subacute/chronic infarct L cerebellum. PMH includes severe MR, moderate TR, SVT.   Clinical Impression  Pt presents with an overall decrease in functional mobility secondary to above. Pt poor historian, unable to accurately answer majority of questions; RN reports that daughter states pt is independent and usually "the life of the party." Today, pt requiring modA+2 for standing transfer to recliner. Pt limited by generalized weakness (though she has maintained impressive strength for prolonged intubation), decreased activity tolerance, poor balance and impaired cognition, including slowed processing, decreased awareness, and poor attention. Of note, pt following majority of simple commands appropriately, but answering questions with seemingly random numbers (i.e. when asked, "what is your daughter's name?" Pt answers, "fifty-six"). Pt would benefit from intensive CIR-level therapies to maximize functional mobility and independence prior to d/c home.     HR up to 142 Post-mobility BP 133/90 SpO2 98% on 4L O2 [place]    Recommendations for follow up therapy are one component of a multi-disciplinary discharge planning process, led by the attending physician.  Recommendations may be updated based on patient status, additional  functional criteria and insurance authorization.  Follow Up Recommendations Acute inpatient rehab (3hours/day)    Assistance Recommended at Discharge Frequent or constant Supervision/Assistance  Patient can return home with the following  A lot of help with walking and/or transfers;A lot of help with bathing/dressing/bathroom;Assistance with cooking/housework;Direct supervision/assist for medications management;Direct supervision/assist for financial management;Assist for transportation;Help with stairs or ramp for entrance    Equipment Recommendations  (TBD)  Recommendations for Other Services       Functional Status Assessment Patient has had a recent decline in their functional status and demonstrates the ability to make significant improvements in function in a reasonable and predictable amount of time.     Precautions / Restrictions Precautions Precautions: Fall;Other (comment) Precaution Comments: watch HR; multiple lines Restrictions Weight Bearing Restrictions: No      Mobility  Bed Mobility Overal bed mobility: Needs Assistance Bed Mobility: Supine to Sit     Supine to sit: Min assist, HOB elevated     General bed mobility comments: increased time and effort, cues to stay on task as pt partially sitting then long sitting then returning to supine each time; minA for scooting hips to EOB    Transfers Overall transfer level: Needs assistance Equipment used: 1 person hand held assist Transfers: Sit to/from Stand, Bed to chair/wheelchair/BSC Sit to Stand: Mod assist, +2 safety/equipment   Step pivot transfers: Mod assist, +2 safety/equipment       General transfer comment: ModA for trunk elevation and HHA to stabilize; pivotal steps from bed to recliner with bilateral HHA and modA to prevent posterior LOB; max verbal cues for sequencing    Ambulation/Gait                  Stairs  Wheelchair Mobility    Modified Rankin (Stroke  Patients Only)       Balance Overall balance assessment: Needs assistance   Sitting balance-Leahy Scale: Fair     Standing balance support: During functional activity, Single extremity supported Standing balance-Leahy Scale: Poor Standing balance comment: reliant on external assist to maintain static standing                             Pertinent Vitals/Pain Pain Assessment Pain Assessment: Faces Faces Pain Scale: No hurt Pain Intervention(s): Monitored during session    Home Living Family/patient expects to be discharged to:: Private residence Living Arrangements: Children                 Additional Comments: pt reports living with daughter (unable to state daughter's name), unable to answer home set-up or PLOF questions    Prior Function Prior Level of Function : Patient poor historian/Family not available             Mobility Comments: RN reports daughter stated pt "is usually the life of the party", independent without DME       Hand Dominance        Extremity/Trunk Assessment   Upper Extremity Assessment Upper Extremity Assessment: RUE deficits/detail;LUE deficits/detail;Difficult to assess due to impaired cognition RUE Deficits / Details: able to achieve full B shoulder flex/ext, noted poor eccentric control with lowering; apparent dysmetria with RUE LUE Deficits / Details: able to achieve full B shoulder flex/ext, noted poor eccentric control with lowering    Lower Extremity Assessment Lower Extremity Assessment: RLE deficits/detail;LLE deficits/detail;Difficult to assess due to impaired cognition RLE Deficits / Details: gross supine MMT testing >4/5 strength in RLE LLE Deficits / Details: gross supine MMT testing >3/5 strength in LLE       Communication   Communication: Expressive difficulties (soft, hoarse voice; answering multiple questions with a number)  Cognition Arousal/Alertness: Awake/alert Behavior During Therapy: Flat  affect Overall Cognitive Status: No family/caregiver present to determine baseline cognitive functioning                                 General Comments: pt not answering questions appropriately (except stating she lives with daughter); would not say first name when asked, but would say "yes" when asked if name is Tamekia; pt later consistently answering numbers in response to questions (i.e. "What is your daughter's name?" pt answers "88", etc.); pt following majority of simple commands with some increased cuing (especially on L-side?)        General Comments General comments (skin integrity, edema, etc.): RN present during session. HR up to 142, post-mobility BP 133/90, SpO2 98% on 4L O2 [place]    Exercises General Exercises - Lower Extremity Heel Slides: AROM, Both, Supine Straight Leg Raises: AROM, Both, Supine   Assessment/Plan    PT Assessment Patient needs continued PT services  PT Problem List Decreased strength;Decreased activity tolerance;Decreased balance;Decreased mobility;Decreased cognition;Decreased coordination;Decreased knowledge of use of DME;Decreased safety awareness;Cardiopulmonary status limiting activity       PT Treatment Interventions DME instruction;Gait training;Stair training;Functional mobility training;Therapeutic activities;Therapeutic exercise;Balance training;Neuromuscular re-education;Cognitive remediation;Patient/family education    PT Goals (Current goals can be found in the Care Plan section)  Acute Rehab PT Goals Patient Stated Goal: agrees she is happy to be OOB PT Goal Formulation: With patient Time For Goal Achievement: 03/01/22 Potential to Achieve Goals:  Good    Frequency Min 3X/week     Co-evaluation               AM-PAC PT "6 Clicks" Mobility  Outcome Measure Help needed turning from your back to your side while in a flat bed without using bedrails?: A Little Help needed moving from lying on your back to sitting on  the side of a flat bed without using bedrails?: A Little Help needed moving to and from a bed to a chair (including a wheelchair)?: A Lot Help needed standing up from a chair using your arms (e.g., wheelchair or bedside chair)?: A Lot Help needed to walk in hospital room?: Total Help needed climbing 3-5 steps with a railing? : Total 6 Click Score: 12    End of Session Equipment Utilized During Treatment: Oxygen Activity Tolerance: Patient tolerated treatment well Patient left: in chair;with call bell/phone within reach;with nursing/sitter in room Nurse Communication: Mobility status PT Visit Diagnosis: Other abnormalities of gait and mobility (R26.89);Muscle weakness (generalized) (M62.81)    Time: 1343-1400 PT Time Calculation (min) (ACUTE ONLY): 17 min   Charges:   PT Evaluation $PT Eval Moderate Complexity: [place], PT, DPT Acute Rehabilitation Services  Pager [phone] Office [place] 02/15/2022, 2:34 PM

## 2022-02-15 NOTE — Procedures (Signed)
Cortrak  Person Inserting Tube:  Kamariah Fruchter C, RD Tube Type:  Cortrak - 43 inches Tube Size:  10 Tube Location:  Left nare Secured by: Bridle Technique Used to Measure Tube Placement:  Marking at nare/corner of mouth Cortrak Secured At:  72 cm   Cortrak Tube Team Note:  Consult received to place a Cortrak feeding tube.   X-ray is required, abdominal x-ray has been ordered by the Cortrak team. Please confirm tube placement before using the Cortrak tube.   If the tube becomes dislodged please keep the tube and contact the Cortrak team at [email or web address] (password [email or web address]) for replacement.  If after hours and replacement cannot be delayed, place a NG tube and confirm placement with an abdominal x-ray.    Elverda Wendel P., RD, LDN, CNSC See AMiON for contact information    

## 2022-02-15 NOTE — Progress Notes (Signed)
[facility] ADULT ICU REPLACEMENT PROTOCOL   The patient does apply for the [facility] Adult ICU Electrolyte Replacment Protocol based on the criteria listed below:   1.Exclusion criteria: TCTS patients, ECMO patients, and Dialysis patients 2. Is GFR >/= 30 ml/min? Yes.    Patient's GFR today is 45 3. Is SCr </= 2? Yes.   Patient's SCr is 1.38 mg/dL 4. Did SCr increase >/= 0.5 in 24 hours? No. 5.Pt's weight >40kg  Yes.   6. Abnormal electrolyte(s): k 3.4  7. Electrolytes replaced per protocol 8.  Call MD STAT for K+ </= 2.5, Phos </= 1, or Mag </= 1 Physician:   D/c oral potassium, Patient is unable to take po medications at this time.   [facility], Lisa Ayala A 02/15/2022 5:36 AM

## 2022-02-15 NOTE — Progress Notes (Signed)
Speech Language Pathology Treatment: Dysphagia  Patient Details Name: Lisa Ayala MRN: [id number] DOB: 1966-05-21 Today's Date: 02/15/2022 Time: 0912-0920 SLP Time Calculation (min) (ACUTE ONLY): 8 min  Assessment / Plan / Recommendation Clinical Impression  Pt seen for dysphagia treatment focusing on ability to initiate food/liquid, versus need for instrumental study or NPO. She is awake responding to therapist with delayed processing/responses and drowsy, decreased eye contact. She is able to vocalize however low intensity and breathy quality s/p 6 day intubation. Accepted trials of ice chip, tsp water with immediate throat clear then cough with water indicative of airway intrusion. Puree solid not given at this time and not clinically indicated. Recommend she remain NPO and alternative means of nutrition at this time. Continue oral care and ST will continue efforts moving pt toward po.    HPI HPI: Patient is a 56 y.o. female with PMH: anxiety, SVT, valvular heart disease including severe mitral regurgitation. She presented to the ED on 02/07/22 secondary to 11 day history of persistent tachycardia. Before  the scheduled 5/17 TEE,  patient rapidly declined with progressive dyspnea, flash pulmonary edema, hypoxia requiring intubation; briefly suffered asystolic arrrest during intubation. She was extubated on 02/15/16.      SLP Plan  Continue with current plan of care      Recommendations for follow up therapy are one component of a multi-disciplinary discharge planning process, led by the attending physician.  Recommendations may be updated based on patient status, additional functional criteria and insurance authorization.    Recommendations  Diet recommendations: NPO Medication Administration: Via alternative means                Oral Care Recommendations: Oral care QID Follow Up Recommendations:  (TBD) Assistance recommended at discharge: Frequent or constant  Supervision/Assistance SLP Visit Diagnosis: Dysphagia, unspecified (R13.10) Plan: Continue with current plan of care           Houston Siren  02/15/2022, 9:24 AM

## 2022-02-16 DIAGNOSIS — I051 Rheumatic mitral insufficiency: Secondary | ICD-10-CM

## 2022-02-16 LAB — BASIC METABOLIC PANEL
Anion gap: 11 (ref 5–15)
Anion gap: 10 (ref 5–15)
Anion gap: 9 (ref 5–15)
BUN: 29 mg/dL — ABNORMAL HIGH (ref 6–20)
BUN: 28 mg/dL — ABNORMAL HIGH (ref 6–20)
BUN: 26 mg/dL — ABNORMAL HIGH (ref 6–20)
CO2: 28 mmol/L (ref 22–32)
CO2: 28 mmol/L (ref 22–32)
CO2: 26 mmol/L (ref 22–32)
Calcium: 8.8 mg/dL — ABNORMAL LOW (ref 8.9–10.3)
Calcium: 8.7 mg/dL — ABNORMAL LOW (ref 8.9–10.3)
Calcium: 8.5 mg/dL — ABNORMAL LOW (ref 8.9–10.3)
Chloride: 97 mmol/L — ABNORMAL LOW (ref 98–111)
Chloride: 99 mmol/L (ref 98–111)
Chloride: 101 mmol/L (ref 98–111)
Creatinine, Ser: 1.09 mg/dL — ABNORMAL HIGH (ref 0.44–1.00)
Creatinine, Ser: 0.99 mg/dL (ref 0.44–1.00)
Creatinine, Ser: 1.06 mg/dL — ABNORMAL HIGH (ref 0.44–1.00)
GFR, Estimated: 59 mL/min — ABNORMAL LOW (ref >60)
GFR, Estimated: >60 mL/min (ref >60)
GFR, Estimated: >60 mL/min (ref >60)
Glucose, Bld: 132 mg/dL — ABNORMAL HIGH (ref 70–99)
Glucose, Bld: 98 mg/dL (ref 70–99)
Glucose, Bld: 124 mg/dL — ABNORMAL HIGH (ref 70–99)
Potassium: 2.7 mmol/L — CL (ref 3.5–5.1)
Potassium: 2.7 mmol/L — CL (ref 3.5–5.1)
Potassium: 3.4 mmol/L — ABNORMAL LOW (ref 3.5–5.1)
Sodium: 136 mmol/L (ref 135–145)
Sodium: 137 mmol/L (ref 135–145)
Sodium: 136 mmol/L (ref 135–145)

## 2022-02-16 LAB — GLUCOSE, CAPILLARY
Glucose-Capillary: 113 mg/dL — ABNORMAL HIGH (ref 70–99)
Glucose-Capillary: 113 mg/dL — ABNORMAL HIGH (ref 70–99)
Glucose-Capillary: 114 mg/dL — ABNORMAL HIGH (ref 70–99)
Glucose-Capillary: 131 mg/dL — ABNORMAL HIGH (ref 70–99)
Glucose-Capillary: 132 mg/dL — ABNORMAL HIGH (ref 70–99)
Glucose-Capillary: 135 mg/dL — ABNORMAL HIGH (ref 70–99)
Glucose-Capillary: 137 mg/dL — ABNORMAL HIGH (ref 70–99)

## 2022-02-16 LAB — CBC
HCT: 38.4 % (ref 36.0–46.0)
Hemoglobin: 12.6 g/dL (ref 12.0–15.0)
MCH: 28.8 pg (ref 26.0–34.0)
MCHC: 32.8 g/dL (ref 30.0–36.0)
MCV: 87.7 fL (ref 80.0–100.0)
Platelets: 400 10*3/uL (ref 150–400)
RBC: 4.38 MIL/uL (ref 3.87–5.11)
RDW: 18.6 % — ABNORMAL HIGH (ref 11.5–15.5)
WBC: 28.5 10*3/uL — ABNORMAL HIGH (ref 4.0–10.5)
nRBC: 0.2 % (ref 0.0–0.2)

## 2022-02-16 LAB — COOXEMETRY PANEL
Carboxyhemoglobin: 2 % — ABNORMAL HIGH (ref 0.5–1.5)
Methemoglobin: <0.7 % (ref 0.0–1.5)
O2 Saturation: 71.1 %
Total hemoglobin: 9.3 g/dL — ABNORMAL LOW (ref 12.0–16.0)

## 2022-02-16 LAB — PHOSPHORUS
Phosphorus: 2.2 mg/dL — ABNORMAL LOW (ref 2.5–4.6)
Phosphorus: 1.7 mg/dL — ABNORMAL LOW (ref 2.5–4.6)

## 2022-02-16 LAB — CULTURE, BLOOD (ROUTINE X 2)
Culture: NO GROWTH
Culture: NO GROWTH
Special Requests: ADEQUATE
Special Requests: ADEQUATE

## 2022-02-16 LAB — MAGNESIUM
Magnesium: 2.1 mg/dL (ref 1.7–2.4)
Magnesium: 2.1 mg/dL (ref 1.7–2.4)

## 2022-02-16 LAB — HEPARIN LEVEL (UNFRACTIONATED): Heparin Unfractionated: 0.24 IU/mL — ABNORMAL LOW (ref 0.30–0.70)

## 2022-02-16 LAB — PROCALCITONIN: Procalcitonin: 3.64 ng/mL

## 2022-02-16 MED ORDER — POTASSIUM CHLORIDE 20 MEQ PO PACK
40.0000 meq | PACK | Freq: Once | ORAL | Status: AC
  Administered 2022-02-16: 40 meq
  Filled 2022-02-16: qty 2

## 2022-02-16 MED ORDER — PROSOURCE TF PO LIQD
45.0000 mL | Freq: Two times a day (BID) | ORAL | Status: DC
  Administered 2022-02-16 – 2022-02-19 (×8): 45 mL
  Filled 2022-02-16 (×8): qty 45

## 2022-02-16 MED ORDER — SORBITOL 70 % SOLN
30.0000 mL | Status: AC
  Administered 2022-02-16: 30 mL via ORAL
  Filled 2022-02-16: qty 30

## 2022-02-16 MED ORDER — SPIRONOLACTONE 12.5 MG HALF TABLET
12.5000 mg | ORAL_TABLET | Freq: Every day | ORAL | Status: DC
  Administered 2022-02-16 – 2022-02-20 (×5): 12.5 mg
  Filled 2022-02-16 (×5): qty 1

## 2022-02-16 MED ORDER — POTASSIUM CHLORIDE 10 MEQ/50ML IV SOLN
10.0000 meq | INTRAVENOUS | Status: AC
  Administered 2022-02-17 (×4): 10 meq via INTRAVENOUS
  Filled 2022-02-16 (×4): qty 50

## 2022-02-16 MED ORDER — VITAL AF 1.2 CAL PO LIQD
1000.0000 mL | ORAL | Status: DC
  Administered 2022-02-16 – 2022-02-19 (×2): 1000 mL
  Filled 2022-02-16: qty 1000

## 2022-02-16 MED ORDER — POTASSIUM CHLORIDE 10 MEQ/50ML IV SOLN
10.0000 meq | INTRAVENOUS | Status: AC
  Administered 2022-02-16 (×8): 10 meq via INTRAVENOUS
  Filled 2022-02-16 (×8): qty 50

## 2022-02-16 MED ORDER — BISACODYL 10 MG RE SUPP
10.0000 mg | Freq: Once | RECTAL | Status: AC
  Administered 2022-02-16: 10 mg via RECTAL
  Filled 2022-02-16: qty 1

## 2022-02-16 NOTE — Progress Notes (Signed)
Physical Therapy Treatment Patient Details Name: Lisa Ayala MRN: [id number] DOB: 1966-02-09 Today's Date: 02/16/2022   History of Present Illness Pt is a 56 y.o. female admitted 02/08/22 with aflutter. Plan for TEE/DCCV, pt rapidly declined 5/17 with progressive dyspnea, flash pulmonary edema, and hypoxia requiring intubation; pt briefly suffered asystolic arrest during intubation. Pt with SVT 5/19 requiring emergent cardioversion; recurrent AF with RVR on 5/20 with DCCV attempted x3; spontaneous return to NSR 5/21. 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 ETT 5/17-5/23. AMS noted 5/24; head CT negative for acute abnormality, suspicion for subacute/chronic infarct L cerebellum. PMH includes severe MR, moderate TR, SVT.    PT Comments    Patient improving mobility able to walk in hallway this afternoon and RN reports up to [facility] multiple times today.  Patient still not aware of her surroundings did not know her daughter's name and needed reminder of reason she is in the hospital.  Patient remains excellent candidate for inpatient rehab; however, if she refuses, she will need capable 24 hour assist and follow up outpatient rehab.   Recommendations for follow up therapy are one component of a multi-disciplinary discharge planning process, led by the attending physician.  Recommendations may be updated based on patient status, additional functional criteria and insurance authorization.  Follow Up Recommendations  Acute inpatient rehab (3hours/day)     Assistance Recommended at Discharge Frequent or constant Supervision/Assistance  Patient can return home with the following A lot of help with bathing/dressing/bathroom;Assistance with cooking/housework;Direct supervision/assist for medications management;Direct supervision/assist for financial management;Assist for transportation;Help with stairs or ramp for entrance;A little help with walking and/or  transfers   Equipment Recommendations  Rolling walker (2 wheels)    Recommendations for Other Services       Precautions / Restrictions Precautions Precautions: Fall Precaution Comments: watch HR; multiple lines     Mobility  Bed Mobility Overal bed mobility: Needs Assistance Bed Mobility: Supine to Sit, Sit to Supine     Supine to sit: HOB elevated, Min assist Sit to supine: Min assist   General bed mobility comments: assist for lifting trunk and to supine assist for positioning    Transfers Overall transfer level: Needs assistance   Transfers: Sit to/from Stand Sit to Stand: Min guard           General transfer comment: performed x 5 with A for balance;lines and cues for technique    Ambulation/Gait Ambulation/Gait assistance: Min assist, Mod assist Gait Distance (Feet): 60 Feet Assistive device: Rolling walker (2 wheels) Gait Pattern/deviations: Step-to pattern, Step-through pattern, Shuffle, Ataxic       General Gait Details: mild ataxia with some difficulty managing walker so increased assist at times for walker management and to prevent LOB   Stairs             Wheelchair Mobility    Modified Rankin (Stroke Patients Only)       Balance Overall balance assessment: Needs assistance   Sitting balance-Leahy Scale: Good       Standing balance-Leahy Scale: Poor Standing balance comment: assist for balance                            Cognition Arousal/Alertness: Awake/alert Behavior During Therapy: Flat affect Overall Cognitive Status: Impaired/Different from baseline Area of Impairment: Orientation, Attention, Safety/judgement, Following commands  Orientation Level: Disoriented to, Situation, Time     Following Commands: Follows one step commands consistently, Follows one step commands with increased time Safety/Judgement: Decreased awareness of safety, Decreased awareness of deficits   Problem  Solving: Slow processing, Difficulty sequencing          Exercises      General Comments General comments (skin integrity, edema, etc.): HR 138, on 4L throughout, pt had pulled off SpO2 probe so not reading with mobility, RN aware; daughter in the room and supportive      Pertinent Vitals/Pain Pain Assessment Pain Score: 0-No pain    Home Living                          Prior Function            PT Goals (current goals can now be found in the care plan section) Progress towards PT goals: Progressing toward goals    Frequency    Min 3X/week      PT Plan Current plan remains appropriate    Co-evaluation              AM-PAC PT "6 Clicks" Mobility   Outcome Measure  Help needed turning from your back to your side while in a flat bed without using bedrails?: A Little Help needed moving from lying on your back to sitting on the side of a flat bed without using bedrails?: A Little Help needed moving to and from a bed to a chair (including a wheelchair)?: A Little Help needed standing up from a chair using your arms (e.g., wheelchair or bedside chair)?: A Little Help needed to walk in hospital room?: A Lot Help needed climbing 3-5 steps with a railing? : Total 6 Click Score: 15    End of Session Equipment Utilized During Treatment: Gait belt;Oxygen Activity Tolerance: Patient tolerated treatment well Patient left: in bed;with call bell/phone within reach;with bed alarm set;with family/visitor present   PT Visit Diagnosis: Other abnormalities of gait and mobility (R26.89);Muscle weakness (generalized) (M62.81)     Time: [id number] PT Time Calculation (min) (ACUTE ONLY): 25 min  Charges:  $Gait Training: 8-22 mins $Therapeutic Activity: 8-22 mins                     Magda Kiel, PT Acute Rehabilitation Services Pager:[phone] [phone] 02/16/2022    Reginia Naas 02/16/2022, 6:07 PM

## 2022-02-16 NOTE — Progress Notes (Addendum)
NAME:  Lisa Ayala, MRN:  [id number], DOB:  Apr 07, 1966, LOS: 9 ADMISSION DATE:  02/07/2022, CONSULTATION DATE:  5/17 REFERRING MD:  Dr. Gardiner Rhyme, CHIEF COMPLAINT:  hypoxia   History of Present Illness:  56 year old female with PMH as below, which is significant for severe MR, moderate TR, and SVT. She had an episode of SVT in October of 22 and as part of her cardiology follow up she was noted to have murmur. Echo showed extensive valvular disease including wide open MR. She unfortunately missed her scheduled TEE. Presented to EP clinic 5/16 with complaints of palpitations and dyspnea. She was found to be in a rapid atrial rhythm and was sent to ED for further evaluation. In the ED she was worked up for SVT and had several rounds of adenosine given with some evidence of atrial flutter when rate slowed. She was started on heparin for flutter and was planned for TEE/DCCV. Prior to the case beginning on 5/17 she rapidly declined with progressive dyspnea, flash pulmonary edema, and hypoxia requiring intubation. Briefly suffered asystolic arrest during intubation. PCCM asked to evaluate for vent management.   Pertinent  Medical History   has a past medical history of Aortic insufficiency, PSVT (paroxysmal supraventricular tachycardia) (HCC), Severe mitral regurgitation, and Tricuspid regurgitation.  Significant Hospital Events: Including procedures, antibiotic start and stop dates in addition to other pertinent events   5/16 admit for A flutter 5/17 decompensated in endoscopy prior to TEE. Intubated. Brief asystolic arrest.  0000000 SVT requiring emergent cardioversion 5/24 No major issues overnight, remains in A-fib RVR despite amio and Dilt drips  5/25 No issues overnight  Interim History / Subjective:  States she feels well this am with no acute complaints  Langley Gauss any feelings of constipations   Objective   Blood pressure 108/77, pulse 88, temperature 98.5 F (36.9 C), temperature source Oral,  resp. rate (!) 27, weight 63 kg, SpO2 98 %. CVP:  [2 mmHg-4 mmHg] 2 mmHg      Intake/Output Summary (Last 24 hours) at 02/16/2022 [id number] Last data filed at 02/16/2022 0600 Gross per 24 hour  Intake 2216.51 ml  Output 1050 ml  Net 1166.51 ml    Filed Weights   02/14/22 0500 02/15/22 0500 02/16/22 0500  Weight: 70.7 kg 64.4 kg 63 kg    Examination: General: Acute on chronically ill appearing middle aged female lying in bed, in NAD HEENT: [place]/AT, MM pink/moist, PERRL,  Neuro: Alert and oriented x3, non-focal CV: Irregularly irregular rate, no murmur, rubs, or gallops,  PULM:  Clear to ascultation, no increased work of breathing, no added breath sounds  GI: soft, bowel sounds active in all 4 quadrants, non-tender, non-distended Extremities: warm/dry, no edema  Skin: no rashes or lesions  Resolved Hospital Problem list     Assessment & Plan:   Acute hypoxemic respiratory failure secondary to flash pulmonary edema in the setting of severe MR.  Possible HAP P: Remains on supplemental oxygen for sat goal > 90 Elevated HOB  Follow culture  Remains on Zosyn with stop date in place  Follow fever curve   Cardiogenic vs septic shock - resolving -COOX 5/24 78% off Milrinone Severe MR due to rheumatic heart dz -Needs eventual surgery if can get stronger Brief PEA arrest 5/17 -ACLS provided x1 min with ROSC achieved  Atrial flutter with RVR -CHA2DS2VASc score 1 SVT -Rates as high as 200  ongoing despite multiple shocks and multiple amio bolus, likely related to severe MR, agitation -Repeat DCCV  5/19 but went back into A-fic RVR 5/20, DCCV re attempted but failured  P: Primary management per HF team  Continuous telemetry  Continue IV heparin amio and dilt drips Strict intake and output  Daily weight Optimize electrolytes Closely monitor renal function  Supplemental oxygen for sat goal > 90 Follow CVP    AKI vs. CKD stable -Creatinine 0.96 with GFR 60 08/2021, Peak creatinine  during admission 1.77 P: Follow renal function  Monitor urine output Trend Bmet Avoid nephrotoxins Ensure adequate renal perfusion   Substance abuse: THC and cocaine on UDS P: Cessation education when appropriate   Constipation  -No BM documented during admission with one large volume vomiting episode am of 5/24 P: Continue aggressive bowel regiment  Suppository today   Best Practice (right click and "Reselect all SmartList Selections" daily)   Diet/type: NPO; TF DVT prophylaxis: systemic heparin GI prophylaxis: PPI Lines: Central line and Arterial Line Foley:  Yes, and it is still needed Code Status:  full code Last date of multidisciplinary goals of care discussion:  Continue to update patient and family daily  Critical care time: NA  Ahyan Kreeger D. Kenton Kingfisher, NP-C [facility] Personal contact information can be found on Amion  02/16/2022, 7:09 AM

## 2022-02-16 NOTE — Progress Notes (Signed)
Nutrition Follow-up  DOCUMENTATION CODES:   Non-severe (moderate) malnutrition in context of chronic illness  INTERVENTION:   Tube Feeding via Cortrak:  Vital AF 1.2 at 20 ml/hr (no titration today) If tolerates trickle, recommend titration to goal tomorrow of 10 mL q 8 hours to rate of 60 ml/hr Goal Regimen: Vital AF 1.2 at 60 ml/hr This provide 1728 kcals, 108 g of protein and 1166 mL of free water  NUTRITION DIAGNOSIS:   Moderate Malnutrition related to chronic illness as evidenced by mild fat depletion, mild muscle depletion.  Continues but being addressed  GOAL:   Patient will meet greater than or equal to 90% of their needs  Not met but being addressed  MONITOR:   TF tolerance, Vent status, Labs, Weight trends  REASON FOR ASSESSMENT:   Ventilator, Consult Enteral/tube feeding initiation and management  ASSESSMENT:   56 yo female admitted with acute respiratory failure, acute pulmonary edema, brief cardiac arrest. PMH includes aortic insufficiency, severe MR, tricuspid regurgitation  5/16 Admitted 5/17 Decompensated in Endo prior to TEE, Intubated, Brief arrest 5/19 SVT requiring emergent cardioversion, TF initiated 5/23 Extubated, TF off 5/24 Large emesis, Abd xray negative, Cortrak placed 5/25 Ice chips only per SLP, trickle TF initiated  Pt remains NPO, SLP evaluated at bedside and pt to remain NPO-ice chips only  Cortrak in placed, no vomiting  +large BM today after having no BM since admission (1 week)  Discussed nutrition poc with PCCM and received verbal order to start trickle TF  Current wt 63 kg; admit weight 69 kg. Net negative 800 mL  Labs: reviewed Meds: lactulose, miralax, colace, reglan    Diet Order:   Diet Order             Diet NPO time specified  Diet effective midnight                   EDUCATION NEEDS:   Not appropriate for education at this time  Skin:  Skin Assessment: Reviewed RN Assessment  Last BM:  5/25  +large BM  Height:   Ht Readings from Last 1 Encounters:  02/07/22 5' 7"  (1.702 m)    Weight:   Wt Readings from Last 1 Encounters:  02/16/22 63 kg     BMI:  Body mass index is 21.75 kg/m.  Estimated Nutritional Needs:   Kcal:  8832-5498 kcals  Protein:  85-105 g  Fluid:  >/= 1.7 L   Kerman Passey MS, RDN, LDN, CNSC Registered Dietitian III Clinical Nutrition RD Pager and On-Call Pager Number Located in [place]

## 2022-02-16 NOTE — Progress Notes (Signed)
[facility] ADULT ICU REPLACEMENT PROTOCOL   The patient does apply for the [facility] Adult ICU Electrolyte Replacment Protocol based on the criteria listed below:   1.Exclusion criteria: TCTS patients, ECMO patients, and Dialysis patients 2. Is GFR >/= 30 ml/min? Yes.    Patient's GFR today is >60 3. Is SCr </= 2? Yes.   Patient's SCr is 1.06 mg/dL 4. Did SCr increase >/= 0.5 in 24 hours? No. 5.Pt's weight >40kg  Yes.   6. Abnormal electrolyte(s): K 3.4  7. Electrolytes replaced per protocol 8.  Call MD STAT for K+ </= 2.5, Phos </= 1, or Mag </= 1 Physician:    Markus Daft A 02/16/2022 11:19 PM

## 2022-02-16 NOTE — Progress Notes (Signed)
Speech Language Pathology Treatment: Dysphagia  Patient Details Name: Lisa Ayala MRN: [place]:[id number] DOB: 02-Jul-1966 Today's Date: 02/16/2022 Time: [id number] SLP Time Calculation (min) (ACUTE ONLY): 18 min  Assessment / Plan / Recommendation Clinical Impression  Pt more awake and interactive today with daughter at bedside. She accepted ice chips then tsp and cup sips water with immediate strong throat clear consistently showing s/s of airway intrusion. Throat clearing led to coughing and expectoration of mucous with a likely acute reversible dysphagia s/p 5 day intubation. She needed encouragement throughout only wanting ice chips. Recommend, after oral care, allowing ice chips when desired and use Cortrak for nutrition (when able to restart per MD) and meds. She is slowly making progress towards her goals.   HPI HPI: Patient is a 56 y.o. female with PMH: anxiety, SVT, valvular heart disease including severe mitral regurgitation. She presented to the ED on 02/07/22 secondary to 11 day history of persistent tachycardia. Before  the scheduled 5/17 TEE,  patient rapidly declined with progressive dyspnea, flash pulmonary edema, hypoxia requiring intubation; briefly suffered asystolic arrrest during intubation. She was extubated on 02/15/16.      SLP Plan  Continue with current plan of care      Recommendations for follow up therapy are one component of a multi-disciplinary discharge planning process, led by the attending physician.  Recommendations may be updated based on patient status, additional functional criteria and insurance authorization.    Recommendations  Diet recommendations: Other(comment) (ice chips) Medication Administration: Via alternative means Postural Changes and/or Swallow Maneuvers: Seated upright 90 degrees                Oral Care Recommendations: Oral care QID Follow Up Recommendations: Home health SLP Assistance recommended at discharge: Frequent or constant  Supervision/Assistance SLP Visit Diagnosis: Dysphagia, unspecified (R13.10) Plan: Continue with current plan of care           Houston Siren  02/16/2022, 12:43 PM

## 2022-02-16 NOTE — Progress Notes (Addendum)
Patient ID: Lisa Ayala, female   DOB: [birth year], 56 y.o.   MRN: [id number]     Advanced Heart Failure Rounding Note  PCP-Cardiologist: Will Meredith Leeds, MD   Subjective:    - 5/17 Flash pulmonary edema. PEA arrest. Intubated. ACLS 1-2 min. ROSC. Placed on milrinone, norepi, and lasix drip.  - 5/19 LHC/RHC: Nonobstructive CAD. RA 9, PA 50/10, mean PCWP 12, CI 6.8.  Rapid SVT, underwent emergent DCCV.  - 5/20: Recurrent AF with RVR, DCCV attempted x 3 but unable to hold NSR.  - 5/21: Spontaneous conversion to NSR - 5/22: Back in AF with RVR - 05/23: Extubated. Milrinone off. Dilt gtt + amio gtt for AF w/ RVR.   Remains in Afib this morning 110s. On dilt, amio and heparin gtts   On Zosyn for PNA. Respiratory culture grew Group C Strep. BC NGTD. AF. However WBC trending up, from 18>>28K. PCT also has been trending up 4.03>>4.11>>4.97.   Diuretics held yesterday w/ low CVP. Wt down 3 lb. CVP 3-4 today   Co-ox stable off pressors/inotropes at 71%  SCr 0.99   K low, 2.7 Mg 2.1   Resting comfortably in bed. Denies dyspnea. Family present at bedside.    Echo: 5/18 EF 60-65%, RV mild HK, rheumatic MV, severe MR, Mild AI     Objective:   Weight Range: 63 kg Body mass index is 21.75 kg/m.   Vital Signs:   Temp:  [97.7 F (36.5 C)-98.7 F (37.1 C)] 98.5 F (36.9 C) (05/25 0004) Pulse Rate:  [64-117] 71 (05/25 0700) Resp:  [14-35] 23 (05/25 0700) BP: (108-153)/(71-127) 110/84 (05/25 0700) SpO2:  [89 %-100 %] 100 % (05/25 0700) Arterial Line BP: (77-163)/(55-83) 77/65 (05/25 0700) Weight:  [63 kg] 63 kg (05/25 0500) Last BM Date : 02/10/22  Weight change: Filed Weights   02/14/22 0500 02/15/22 0500 02/16/22 0500  Weight: 70.7 kg 64.4 kg 63 kg    Intake/Output:   Intake/Output Summary (Last 24 hours) at 02/16/2022 0734 Last data filed at 02/16/2022 0600 Gross per 24 hour  Intake 2216.51 ml  Output 1050 ml  Net 1166.51 ml      Physical Exam   CVP 3-4   General:  fatigued appearing. No respiratory difficulty HEENT: normal + cor trak, dentition in poor repair  Neck: supple. no JVD. +Rt IJ CVC, Carotids 2+ bilat; no bruits. No lymphadenopathy or thyromegaly appreciated. Cor: PMI nondisplaced. Irregularly irregular rhythm. 3/6 MR murmur  Lungs: clear Abdomen: soft, nontender, nondistended. No hepatosplenomegaly. No bruits or masses. Good bowel sounds. Extremities: no cyanosis, clubbing, rash, edema + SCDs  Neuro: alert & oriented x 3, cranial nerves grossly intact. moves all 4 extremities w/o difficulty. Affect pleasant.   Telemetry   Afib rates 110s, personally reviewed   Labs    CBC Recent Labs    02/15/22 0336 02/15/22 0956 02/16/22 0354  WBC 18.8*  --  28.5*  HGB 13.0 13.6 12.6  HCT 39.4 40.0 38.4  MCV 86.0  --  87.7  PLT 363  --  [id number]   Basic Metabolic Panel Recent Labs    02/15/22 0336 02/15/22 0956 02/16/22 0354 02/16/22 0530  NA 137   < > 136 137  K 3.4*   < > 2.7* 2.7*  CL 95*  --  97* 99  CO2 29  --  28 28  GLUCOSE 173*  --  132* 98  BUN 29*  --  29* 28*  CREATININE 1.38*  --  1.09* 0.99  CALCIUM 9.0  --  8.8* 8.7*  MG 2.3  --  2.1  --    < > = values in this interval not displayed.   Liver Function Tests No results for input(s): AST, ALT, ALKPHOS, BILITOT, PROT, ALBUMIN in the last 72 hours.  No results for input(s): LIPASE, AMYLASE in the last 72 hours. Cardiac Enzymes No results for input(s): CKTOTAL, CKMB, CKMBINDEX, TROPONINI in the last 72 hours.  BNP: BNP (last 3 results) Recent Labs    07/02/21 1335 02/08/22 0351  BNP 1,157.2* 709.9*    ProBNP (last 3 results) No results for input(s): PROBNP in the last 8760 hours.   D-Dimer No results for input(s): DDIMER in the last 72 hours. Hemoglobin A1C No results for input(s): HGBA1C in the last 72 hours. Fasting Lipid Panel Recent Labs    02/14/22 0441  TRIG 151*   Thyroid Function Tests No results for input(s): TSH, T4TOTAL,  T3FREE, THYROIDAB in the last 72 hours.  Invalid input(s): FREET3  Other results:   Imaging    DG Abd 1 View  Result Date: 02/15/2022 CLINICAL DATA:  Ileus EXAM: ABDOMEN - 1 VIEW COMPARISON:  Portable exam 0820 hours compared to 02/08/2022 FINDINGS: Nonobstructive bowel gas pattern. No bowel dilatation or bowel wall thickening. Improved atelectasis versus consolidation LEFT lower lobe. No urinary tract calcification. Levoconvex thoracolumbar scoliosis. IMPRESSION: No acute abnormalities. Electronically Signed   By: Lavonia Dana M.D.   On: 02/15/2022 08:27   CT HEAD WO CONTRAST (5MM)  Result Date: 02/15/2022 CLINICAL DATA:  Altered mental status. EXAM: CT HEAD WITHOUT CONTRAST TECHNIQUE: Contiguous axial images were obtained from the base of the skull through the vertex without intravenous contrast. RADIATION DOSE REDUCTION: This exam was performed according to the departmental dose-optimization program which includes automated exposure control, adjustment of the mA and/or kV according to patient size and/or use of iterative reconstruction technique. COMPARISON:  None Available. FINDINGS: Brain: No evidence of acute infarction, hemorrhage, hydrocephalus, extra-axial collection or mass lesion/mass effect. Focal area of low density within the left cerebellar hemisphere is identified, image 7/3. This is suspicious for subacute to chronic infarct. Vascular: No hyperdense vessel or unexpected calcification. Skull: Normal. Negative for fracture or focal lesion. Sinuses/Orbits: No acute finding. Other: None. IMPRESSION: 1. No acute intracranial abnormalities. 2. Focal area of low density within the left cerebellar hemisphere is suspicious for subacute to chronic infarct. Electronically Signed   By: Kerby Moors M.D.   On: 02/15/2022 13:20   DG Abd Portable 1V  Result Date: 02/15/2022 CLINICAL DATA:  Feeding tube placement EXAM: PORTABLE ABDOMEN - 1 VIEW COMPARISON:  02/16/2020 FINDINGS: Feeding tube  enters the stomach with the tip near the gastric antrum. Normal bowel gas pattern Cardiac enlargement. IMPRESSION: Feeding tube tip gastric antrum. Electronically Signed   By: Franchot Gallo M.D.   On: 02/15/2022 13:17   VAS US DOPPLER PRE CABG  Result Date: 02/15/2022 PREOPERATIVE VASCULAR EVALUATION Patient Name:  Lisa Ayala [facility]  Date of Exam:   02/15/2022 Medical Rec #: [id number]        Accession #:    [id number] Date of Birth: 09-12-1966         Patient Gender: F Patient Age:   34 years Exam Location:  [facility] Procedure:      VAS US DOPPLER PRE CABG Referring Phys: Remo Lipps HENDRICKSON --------------------------------------------------------------------------------  Indications:      Pre-CABG. Other Factors:    MV disease. Limitations:      Right bandaging/line  Comparison Study: No prior study Performing Technologist: Gertie Fey MHA, RDMS, RVT, RDCS  Examination Guidelines: A complete evaluation includes B-mode imaging, spectral Doppler, color Doppler, and power Doppler as needed of all accessible portions of each vessel. Bilateral testing is considered an integral part of a complete examination. Limited examinations for reoccurring indications may be performed as noted.  Right Carotid Findings: +----------+--------+--------+--------+--------+--------+           PSV cm/sEDV cm/sStenosisDescribeComments +----------+--------+--------+--------+--------+--------+ CCA Prox  57      13                               +----------+--------+--------+--------+--------+--------+ ICA Prox  62      11                               +----------+--------+--------+--------+--------+--------+ ICA Distal63      16                               +----------+--------+--------+--------+--------+--------+ +----------+--------+-------+----------------+------------+           PSV cm/sEDV cmsDescribe        Arm Pressure +----------+--------+-------+----------------+------------+  Subclavian120            Multiphasic, WNL             +----------+--------+-------+----------------+------------+ +---------+--------+---+--------+--+---------+ VertebralPSV cm/s103EDV cm/s17Antegrade +---------+--------+---+--------+--+---------+ Left Carotid Findings: +----------+--------+-------+--------+----------------------+------------------+           PSV cm/sEDV    StenosisDescribe              Comments                             cm/s                                                    +----------+--------+-------+--------+----------------------+------------------+ CCA Prox  117     16                                                      +----------+--------+-------+--------+----------------------+------------------+ CCA Distal81      22                                   intimal thickening +----------+--------+-------+--------+----------------------+------------------+ ICA Prox  47      12             heterogenous and                                                          smooth                                   +----------+--------+-------+--------+----------------------+------------------+ ICA Distal62  24                                                      +----------+--------+-------+--------+----------------------+------------------+ ECA       55      8                                                       +----------+--------+-------+--------+----------------------+------------------+ +----------+--------+--------+----------------+------------+ SubclavianPSV cm/sEDV cm/sDescribe        Arm Pressure +----------+--------+--------+----------------+------------+           163             Multiphasic, WNL             +----------+--------+--------+----------------+------------+ +---------+--------+--+--------+--+---------+ VertebralPSV cm/s53EDV cm/s19Antegrade +---------+--------+--+--------+--+---------+  ABI  Findings: +--------+------------------+-----+---------+--------+ Right   Rt Pressure (mmHg)IndexWaveform Comment  +--------+------------------+-----+---------+--------+ Brachial                       triphasic         +--------+------------------+-----+---------+--------+ +--------+------------------+-----+---------+-------+ Left    Lt Pressure (mmHg)IndexWaveform Comment +--------+------------------+-----+---------+-------+ [id number]                    triphasic        +--------+------------------+-----+---------+-------+  Right Doppler Findings: +-----------+--------+-----+---------+-----------------------------------------+ Site       PressureIndexDoppler  Comments                                  +-----------+--------+-----+---------+-----------------------------------------+ Brachial                triphasic                                          +-----------+--------+-----+---------+-----------------------------------------+ Radial                  triphasic                                          +-----------+--------+-----+---------+-----------------------------------------+ Ulnar                   triphasic                                          +-----------+--------+-----+---------+-----------------------------------------+ Palmar Arch                      Signal obliterates with radial                                             compression, is unaffected with ulnar  comperssion                               +-----------+--------+-----+---------+-----------------------------------------+  Left Doppler Findings: +-----------+--------+-----+---------+--------------------+ Site       PressureIndexDoppler  Comments             +-----------+--------+-----+---------+--------------------+ Brachial   125          triphasic                      +-----------+--------+-----+---------+--------------------+ Radial                  triphasic                     +-----------+--------+-----+---------+--------------------+ Ulnar                   triphasic                     +-----------+--------+-----+---------+--------------------+ Palmar Arch                      Within normal limits +-----------+--------+-----+---------+--------------------+  Summary: Right Carotid: Velocities in the right ICA are consistent with a 1-39% stenosis.                Limited visualization secondary to bandaging. Left Carotid: Velocities in the left ICA are consistent with a 1-39% stenosis. Vertebrals:  Bilateral vertebral arteries demonstrate antegrade flow. Subclavians: Normal flow hemodynamics were seen in bilateral subclavian              arteries.  Electronically signed by Harold Barban MD on 02/15/2022 at 8:52:49 PM.    Final    VAS [place] UPPER EXTREMITY VENOUS DUPLEX  Result Date: 02/15/2022 UPPER VENOUS STUDY  Patient Name:  Lisa Ayala [facility]  Date of Exam:   02/15/2022 Medical Rec #: [id number]        Accession #:    [id number] Date of Birth: August 15, 1966         Patient Gender: F Patient Age:   18 years Exam Location:  [facility] Procedure:      VAS [place] UPPER EXTREMITY VENOUS DUPLEX Referring Phys: Ina Homes --------------------------------------------------------------------------------  Indications: Edema Comparison Study: No prior study Performing Technologist: Maudry Mayhew MHA, RDMS, RVT, RDCS  Examination Guidelines: A complete evaluation includes B-mode imaging, spectral Doppler, color Doppler, and power Doppler as needed of all accessible portions of each vessel. Bilateral testing is considered an integral part of a complete examination. Limited examinations for reoccurring indications may be performed as noted.  Right Findings: +----------+------------+---------+-----------+----------+------------+ RIGHT      CompressiblePhasicitySpontaneousProperties  Summary    +----------+------------+---------+-----------+----------+------------+ IJV                                Yes              line present +----------+------------+---------+-----------+----------+------------+ Subclavian    Full       Yes       Yes                           +----------+------------+---------+-----------+----------+------------+ Axillary      Full       Yes       Yes                           +----------+------------+---------+-----------+----------+------------+  Brachial      Full       Yes       Yes                           +----------+------------+---------+-----------+----------+------------+ Radial        Full                                               +----------+------------+---------+-----------+----------+------------+ Ulnar         Full                                               +----------+------------+---------+-----------+----------+------------+ Cephalic      Full                                               +----------+------------+---------+-----------+----------+------------+ Basilic       None                                     Acute     +----------+------------+---------+-----------+----------+------------+  Left Findings: +----------+------------+---------+-----------+----------+-------+ LEFT      CompressiblePhasicitySpontaneousPropertiesSummary +----------+------------+---------+-----------+----------+-------+ Subclavian               Yes       Yes                      +----------+------------+---------+-----------+----------+-------+  Summary:  Right: No evidence of deep vein thrombosis in the upper extremity. Findings consistent with acute superficial vein thrombosis involving the right basilic vein.  Left: No evidence of thrombosis in the subclavian.  *See table(s) above for measurements and observations.  Diagnosing physician: Harold Barban MD Electronically signed by Harold Barban MD on 02/15/2022 at 8:52:39 PM.    Final      Medications:     Scheduled Medications:  chlorhexidine  15 mL Mouth Rinse BID   Chlorhexidine Gluconate Cloth  6 each Topical Daily   docusate  100 mg Per Tube BID   insulin aspart  0-15 Units Subcutaneous Q4H   lactulose  20 g Oral Once   mouth rinse  15 mL Mouth Rinse q12n4p   metoCLOPramide (REGLAN) injection  10 mg Intravenous Q8H   pantoprazole (PROTONIX) IV  40 mg Intravenous QHS   polyethylene glycol  17 g Per Tube Daily   sodium chloride flush  3 mL Intravenous Q12H    Infusions:  amiodarone 60 mg/hr (02/16/22 0620)   diltiazem (CARDIZEM) infusion 15 mg/hr (02/16/22 0718)   heparin 1,200 Units/hr (02/16/22 0600)   piperacillin-tazobactam (ZOSYN)  IV 3.375 g (02/16/22 0620)   potassium chloride 10 mEq (02/16/22 0701)    PRN Medications: acetaminophen, bisacodyl, hydrALAZINE, ondansetron (ZOFRAN) IV    Patient Profile   56 y/o woman with severe MR and polysubstance abuse Lost to f/u. Admitted with AFL with RVR -> cardiogenic shock  Assessment/Plan   1. Shock, cardiogenic - in setting of severe MR and flash pulmonary edema. Lactic acid 2.6>1.9  - component of septic  shock due to PNA. Respiratory + Group C Strep - CO-OX stable 71% off milrinone.  - CVP 3-4 today. Hold diuretics today   2. PEA Arrest -ACLS x1 min with ROSC -Now recovered    3.  Acute Hypoxic Respiratory Failure due to flash pulmonary edema/PNA -Urgent intubation 5/17, extubated 05/23. O2 stable 3L [place] -Green sputum with fever, leukocytosis. PCT 4 => suspect PNA. Zosyn started. WBC 16.5>17>18.8>28K, follow. -Respiratory Cx + Group C Strep. Continue Zosyn. - ? Need to broaden abx given continued rise in WBC and PCT    4.  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   - Seen by TCTS. Plan MVR once recovered from acute illness   5 . PAF/SVT - A flutter on admit. Spontaneously converted to SR.  - Repeat DC-CV for rapid SVT 5/19.  - She went into AF with RVR 5/20 early am, rate in the 140s-160s.  DCCV attempted again on 5/20 but failed. - Remains in Afib today, 110s.  - continue dilt and amio gtts  - rhythm control difficult w/ severe MR/severe LAE. Will need MAZE at time of MVR.  - Continue heparin gtt.  6.  AKI due to ATN/shock -Creatinine 0.96--->1.8 -> 1.49 -> 1.34 -> 1.64->1.5 ->1.4->1.0->0.99  -Avoid hypotension.   7. Tobacco/Polysubstance abuse  - tox screen +cocaine/THC  8. Hypokalemia  - K 2.7  - Aggressive K supp - add back spiro   9. Anxiety - severe   10. ID - Suspect PNA - CXR with bibasilar opacities.  - Febrile with PCT 4, WBCs 17.7, green sputum => started Zosyn.  - Respiratory Cx + Group C Strep. Continue Zosyn - WBC up 18>>28K and PCT rising - ? Need to broaden abx coverage   11. GI - Episode emesis this am. Primary contents TF.  - No BM since admit - ? Ileus. CCM checking KUB.  12. Hypertension - BP improving after diltiazem gtt added for AF with RVR - Currently NPO. Can give IV hydralazine PRN. - add back spiro to help w/ BP and K   13. RUE swelling - Venous duplex + acute superficial vein thrombosis. No DVT   - supportive care    Length of Stay: [address], PA-C  02/16/2022, 7:34 AM  Advanced Heart Failure Team Pager [phone] (M-F; 7a - 5p)  Please contact [facility] for night-coverage after hours (5p -7a ) and weekends on [email or web address]  Patient seen and examined with the above-signed Advanced Practice Provider and/or Housestaff. I personally reviewed laboratory data, imaging studies and relevant notes. I independently examined the patient and formulated the important aspects of the plan. I have edited the note to reflect any of my changes or salient points. I have personally discussed the  plan with the patient and/or family.  Remains in AF. Rates still elevated but improved on IV amio and IV dilt.  CVP 3-4. Co-ox 71%  Had emesis yesterday so TFs on hold. Cor-trak in place. WBC up but no fevers. KUB ok   General:  Weak appearing. No resp difficulty HEENT: normal + cortrak  Neck: supple. no JVD. Carotids 2+ bilat; no bruits. No lymphadenopathy or thryomegaly appreciated. Cor: PMI nondisplaced. Irregular rachy 3/6 MR Lungs: coarse Abdomen: soft, nontender, nondistended. No hepatosplenomegaly. No bruits or masses. Good bowel sounds. Extremities: no cyanosis, clubbing, rash, edema Neuro:  alert & orientedx3, cranial nerves grossly intact. moves all 4 extremities w/o difficulty. Affect pleasant  Remains tenuous. Continue amio and dilt for AF . May need DC-CV at some point. Need to focus on getting her stronger for MVR. Has been seen by CIR. Ideally would get her strong enough to go to CIR and complete CIR prior to considering surgery.   Watch WBC closely. Resume TFs as tolerated, Aggressive bowel regimen.   Glori Bickers, MD  11:02 AM

## 2022-02-16 NOTE — Progress Notes (Signed)
[facility] ADULT ICU REPLACEMENT PROTOCOL   The patient does apply for the [facility] Adult ICU Electrolyte Replacment Protocol based on the criteria listed below:   1.Exclusion criteria: TCTS patients, ECMO patients, and Dialysis patients 2. Is GFR >/= 30 ml/min? Yes.    Patient's GFR today is 59 3. Is SCr </= 2? Yes.   Patient's SCr is 1.09 mg/dL 4. Did SCr increase >/= 0.5 in 24 hours? No. 5.Pt's weight >40kg  Yes.   6. Abnormal electrolyte(s): k 2.7  7. Electrolytes replaced per protocol 8.  Call MD STAT for K+ </= 2.5, Phos </= 1, or Mag </= 1 Physician:    Markus Daft A 02/16/2022 4:46 AM

## 2022-02-16 NOTE — PMR Pre-admission (Signed)
PMR Admission Coordinator Pre-Admission Assessment  Patient: Lisa Ayala is an 56 y.o., female MRN: [id number] DOB: Feb 16, 1966 Height:   Weight: 63 kg  Insurance Information Medicaid potential - currently self pay with no insurance  SECONDARY:       Policy#:      Phone#:   Development worker, community:       Phone#:   The Engineer, petroleum" for patients in Inpatient Rehabilitation Facilities with attached "Privacy Act [place] Records" was provided and verbally reviewed with: N/A  Emergency Contact Information Contact Information     Name Relation Home Work Mobile   Upham,[name] Daughter   [phone]       Current Medical History  Patient Admitting Diagnosis: Debility  History of Present Illness: A 56 y.o. female admitted 02/08/22 with aflutter. Plan for TEE/DCCV, pt rapidly declined 5/17 with progressive dyspnea, flash pulmonary edema, and hypoxia requiring intubation; pt briefly suffered asystolic arrest during intubation. Pt with SVT 5/19 requiring emergent cardioversion; recurrent AF with RVR on 5/20 with DCCV attempted x3; spontaneous return to NSR 5/21. 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 ETT 5/17-5/23. AMS noted 5/24; head CT negative for acute abnormality, suspicion for subacute/chronic infarct L cerebellum. PMH includes severe MR, moderate TR, SVT. Pt. With delirium and agitation throughout admission, cleared by 5/29. PT/OT saw Pt. And recommended CIR to assist return PLOF  Patient's medical record from [facility] has been reviewed by the rehabilitation admission coordinator and physician.  Past Medical History  Past Medical History:  Diagnosis Date   Aortic insufficiency    PSVT (paroxysmal supraventricular tachycardia) (HCC)    Severe mitral regurgitation    a. 08/2021 Echo: EF 70-75%, no rwma, mod dil LV, GrII DD, nl RV fxn, massively dil LA, mild-mod dil RA, severe MR due to lack of  central coaptation of MV leaflets. Mild MS. Mod TR. Mild-mod AI.   Tricuspid regurgitation     Has the patient had major surgery during 100 days prior to admission? No  Family History   Family history is unknown by patient.  Current Medications  Current Facility-Administered Medications:    acetaminophen (TYLENOL) tablet 650 mg, 650 mg, Oral, Q4H PRN, Theora Gianotti, NP, 650 mg at 02/12/22 2015   [EXPIRED] amiodarone (NEXTERONE PREMIX) 360-4.14 MG/200ML-% (1.8 mg/mL) IV infusion, 60 mg/hr, Intravenous, Continuous, Last Rate: 33.3 mL/hr at 02/08/22 1600, 60 mg/hr at 02/08/22 1600 **FOLLOWED BY** amiodarone (NEXTERONE PREMIX) 360-4.14 MG/200ML-% (1.8 mg/mL) IV infusion, 60 mg/hr, Intravenous, Continuous, Bensimhon, Shaune Pascal, MD, Last Rate: 33.3 mL/hr at 02/16/22 1308, 60 mg/hr at 02/16/22 1308   bisacodyl (DULCOLAX) suppository 10 mg, 10 mg, Rectal, Once PRN, Harris, Whitney D, NP   chlorhexidine (PERIDEX) 0.12 % solution 15 mL, 15 mL, Mouth Rinse, BID, Candee Furbish, MD, 15 mL at 02/16/22 1000   Chlorhexidine Gluconate Cloth 2 % PADS 6 each, 6 each, Topical, Daily, Donato Heinz, MD, 6 each at 02/16/22 1000   [COMPLETED] diltiazem (CARDIZEM) 1 mg/mL load via infusion 10 mg, 10 mg, Intravenous, Once, 10 mg at 02/14/22 0936 **AND** diltiazem (CARDIZEM) 125 mg in dextrose 5% 125 mL (1 mg/mL) infusion, 5-20 mg/hr, Intravenous, Continuous, Bensimhon, Shaune Pascal, MD, Last Rate: 15 mL/hr at 02/16/22 0800, 15 mg/hr at 02/16/22 0800   docusate (COLACE) 50 MG/5ML liquid 100 mg, 100 mg, Per Tube, BID, Candee Furbish, MD, 100 mg at 02/16/22 0958   feeding supplement (PROSource TF) liquid 45 mL,  45 mL, Per Tube, BID, Harris, Whitney D, NP   feeding supplement (VITAL AF 1.2 CAL) liquid 1,000 mL, 1,000 mL, Per Tube, Continuous, Harris, Whitney D, NP   heparin ADULT infusion 100 units/mL (25000 units/265m), 1,200 Units/hr, Intravenous, Continuous, Rudisill, TJodean Lima RPH, Last Rate: 12 mL/hr  at 02/16/22 1203, 1,200 Units/hr at 02/16/22 1203   hydrALAZINE (APRESOLINE) injection 5 mg, 5 mg, Intravenous, Q2H PRN, SCandee Furbish MD, 5 mg at 02/14/22 1839   insulin aspart (novoLOG) injection 0-15 Units, 0-15 Units, Subcutaneous, Q4H, SCandee Furbish MD, 2 Units at 02/16/22 06754  MEDLINE mouth rinse, 15 mL, Mouth Rinse, q12n4p, SCandee Furbish MD, 15 mL at 02/16/22 1204   metoCLOPramide (REGLAN) injection 10 mg, 10 mg, Intravenous, Q8H, SCandee Furbish MD, 10 mg at 02/16/22 0533   ondansetron ([facility] injection 4 mg, 4 mg, Intravenous, Q6H PRN, BTheora Gianotti NP, 4 mg at 02/15/22 0707   pantoprazole (PROTONIX) injection 40 mg, 40 mg, Intravenous, QHS, SCandee Furbish MD, 40 mg at 02/15/22 2145   piperacillin-tazobactam (ZOSYN) IVPB 3.375 g, 3.375 g, Intravenous, Q8H, SCandee Furbish MD, Last Rate: 12.5 mL/hr at 02/16/22 0800, Infusion Verify at 02/16/22 0800   polyethylene glycol (MIRALAX / GLYCOLAX) packet 17 g, 17 g, Per Tube, Daily, SCandee Furbish MD, 17 g at 02/16/22 04920  sodium chloride flush (NS) 0.9 % injection 3 mL, 3 mL, Intravenous, Q12H, Simmons, Brittainy M, PA-C, 3 mL at 02/16/22 1000   spironolactone (ALDACTONE) tablet 12.5 mg, 12.5 mg, Per Tube, Daily, SCandee Furbish MD, 12.5 mg at 02/16/22 01007 Patients Current Diet:  Diet Order             Diet NPO time specified  Diet effective midnight                   Precautions / Restrictions Precautions Precautions: Fall, Other (comment) Precaution Comments: watch HR; multiple lines Restrictions Weight Bearing Restrictions: No   Has the patient had 2 or more falls or a fall with injury in the past year? No  Prior Activity Level Community (5-7x/wk): Went out daily.  Was working at [employer]12-13 hours a day  Prior Functional Level Self Care: Did the patient need help bathing, dressing, using the toilet or eating? Independent  Indoor Mobility: Did the patient need assistance with walking  from room to room (with or without device)? Independent  Stairs: Did the patient need assistance with internal or external stairs (with or without device)? Independent  Functional Cognition: Did the patient need help planning regular tasks such as shopping or remembering to take medications? Independent  Patient Information Are you of Hispanic, Latino/a,or Spanish origin?: E. Yes, another Hispanic, Latino, or Spanish origin What is your race?: B. Black or African American Do you need or want an interpreter to communicate with a doctor or health care staff?: 0. No  Patient's Response To:  Health Literacy and Transportation Is the patient able to respond to health literacy and transportation needs?: Yes Health Literacy - How often do you need to have someone help you when you read instructions, pamphlets, or other written material from your doctor or pharmacy?: Never In the past 12 months, has lack of transportation kept you from medical appointments or from getting medications?: No In the past 12 months, has lack of transportation kept you from meetings, work, or from getting things needed for daily living?: No  HDevelopment worker, international aid/ EParamedic  Devices/Equipment: None  Prior Device Use: Indicate devices/aids used by the patient prior to current illness, exacerbation or injury? None of the above  Current Functional Level Cognition  Overall Cognitive Status: Impaired/Different from baseline Orientation Level: Oriented to person, Oriented to place, Disoriented to time, Disoriented to situation Following Commands: Follows one step commands consistently, Follows one step commands with increased time Safety/Judgement: Decreased awareness of safety, Decreased awareness of deficits General Comments: pt not answering questions appropriately, Pt pleasant and cooperative throughout session, using more words, but they are not always appropriate for question asked "is this a more  comfortable position - Lets get out of here" pt following majority of simple commands with some increased multimodal cues    Extremity Assessment (includes Sensation/Coordination)  Upper Extremity Assessment: RUE deficits/detail, LUE deficits/detail, Generalized weakness RUE Deficits / Details: able to achieve full B shoulder flex/ext, noted poor eccentric control with lowering; apparent dysmetria with RUE, grasp grossly 3+/5 RUE Sensation: WNL, decreased proprioception RUE Coordination: decreased fine motor, decreased gross motor LUE Deficits / Details: able to achieve full B shoulder flex/ext, noted poor eccentric control with lowering, grasp weak grossly  Lower Extremity Assessment: Defer to PT evaluation RLE Deficits / Details: gross supine MMT testing >4/5 strength in RLE LLE Deficits / Details: gross supine MMT testing >3/5 strength in LLE    ADLs  Overall ADL's : Needs assistance/impaired Eating/Feeding: NPO Grooming: Wash/dry face, Moderate assistance, Sitting Grooming Details (indicate cue type and reason): able to use wash cloth to wipe nose, but unable to bring to eyes without assist Upper Body Bathing: Moderate assistance Lower Body Bathing: Moderate assistance Upper Body Dressing : Maximal assistance Lower Body Dressing: Maximal assistance Toilet Transfer: Moderate assistance, Stand-pivot, BSC/3in1 Toilet Transfer Details (indicate cue type and reason): to the left Toileting- Clothing Manipulation and Hygiene: Maximal assistance, +2 for safety/equipment, Sit to/from stand Toileting - Clothing Manipulation Details (indicate cue type and reason): OT in front for balance/boost, NT in back performing peri care Functional mobility during ADLs:  (SPT only this session) General ADL Comments: cognition impacting ability to perform ADL as well as balance, coordination, strength    Mobility  Overal bed mobility: Needs Assistance Bed Mobility: Supine to Sit, Sit to Supine Supine to  sit: Min assist, HOB elevated Sit to supine: Mod assist General bed mobility comments: increased time and effort, cues for attention; minA for scooting hips to EOB, assist for BLE back to into bed    Transfers  Overall transfer level: Needs assistance Equipment used: 1 person hand held assist Transfers: Sit to/from Stand, Bed to chair/wheelchair/BSC Sit to Stand: Mod assist Bed to/from chair/wheelchair/BSC transfer type:: Step pivot Step pivot transfers: Mod assist General transfer comment: ModA for trunk elevation and HHA to stabilize; pivotal steps from bed to [facility] with bilateral HHA and modA to prevent posterior LOB; max verbal cues for sequencing    Ambulation / Gait / Stairs / Wheelchair Mobility       Posture / Balance Balance Overall balance assessment: Needs assistance Sitting balance-Leahy Scale: Fair Standing balance support: During functional activity, Single extremity supported Standing balance-Leahy Scale: Poor Standing balance comment: reliant on external assist to maintain static standing    Special needs/care consideration Continuous Drip IV  Heparindrip and Special service needs Has coretrak   Previous Home Environment (from acute therapy documentation) Living Arrangements: Children Additional Comments: pt reports living with daughter (unable to state daughter's name), unable to answer home set-up or PLOF questions  Discharge Living Setting Plans for  Discharge Living Setting: Other (Comment), Lives with (comment) (Townhouse with daughter) Type of Home at Discharge: Other (Comment) (Townhouse) Discharge Home Layout: Two level, 1/2 bath on main level, Bed/bath upstairs Alternate Level Stairs-Rails: Left Alternate Level Stairs-Number of Steps: 15 Discharge Home Access: Stairs to enter Entrance Stairs-Rails: None Entrance Stairs-Number of Steps: 1 Discharge Bathroom Shower/Tub: Tub/shower unit, Curtain Discharge Bathroom Toilet: Standard Discharge Bathroom  Accessibility: Yes How Accessible: Accessible via walker Does the patient have any problems obtaining your medications?: No (Does not have insurance)  Social/Family/Support Systems Patient Roles: Parent (Has a 65 yo daughter who is in school) Sport and exercise psychologist Information: Deiondra Denley - daughter - [phone] Anticipated Caregiver: Daughter Ability/Limitations of Caregiver: Daughter goes back to college on August 15-19 Caregiver Availability: 24/7 Discharge Plan Discussed with Primary Caregiver: Yes Is Caregiver In Agreement with Plan?: Yes Does Caregiver/Family have Issues with Lodging/Transportation while Pt is in Rehab?: No  Goals Patient/Family Goal for Rehab: PT/OT/SLP supervision goals Expected length of stay: 74-6 days Pt/Family Agrees to Admission and willing to participate: Yes Program Orientation Provided & Reviewed with Pt/Caregiver Including Roles  & Responsibilities: Yes  Decrease burden of Care through IP rehab admission: N/A  Possible need for SNF placement upon discharge: Not anticipated  Patient Condition: I have reviewed medical records from [facility], spoken with CM, and patient and daughter. I met with patient at the bedside and discussed via phone for inpatient rehabilitation assessment.  Patient will benefit from ongoing PT, OT, and SLP, can actively participate in 3 hours of therapy a day 5 days of the week, and can make measurable gains during the admission.  Patient will also benefit from the coordinated team approach during an Inpatient Acute Rehabilitation admission.  The patient will receive intensive therapy as well as Rehabilitation physician, nursing, social worker, and care management interventions.  Due to bladder management, bowel management, safety, skin/wound care, disease management, medication administration, pain management, and patient education the patient requires 24 hour a day rehabilitation nursing.  The patient is currently min A-min g with mobility  and basic ADLs.  Discharge setting and therapy post discharge at home with home health is anticipated.  Patient has agreed to participate in the Acute Inpatient Rehabilitation Program and will admit today.  Preadmission Screen Completed By:  Retta Diones, 02/16/2022 2:21 PM ______________________________________________________________________   Discussed status with Dr. Dagoberto Ligas  on 02/21/22 at  1140 and received approval for admission today.  Admission Coordinator:  Retta Diones, RN, time 1141/Date 02/21/22   Assessment/Plan: Diagnosis: Does the need for close, 24 hr/day Medical supervision in concert with the patient's rehab needs make it unreasonable for this patient to be served in a less intensive setting? Yes Co-Morbidities requiring supervision/potential complications: PEA arrest, flash pulmonary edema, Afib with RVR, PNA, resp failure s/p extubation; severe MR- needs valve replacement Due to bladder management, bowel management, safety, skin/wound care, disease management, medication administration, pain management, and patient education, does the patient require 24 hr/day rehab nursing? Yes Does the patient require coordinated care of a physician, rehab nurse, PT, OT, and SLP to address physical and functional deficits in the context of the above medical diagnosis(es)? Yes Addressing deficits in the following areas: balance, endurance, locomotion, strength, transferring, bowel/bladder control, bathing, dressing, feeding, grooming, toileting, and cognition Can the patient actively participate in an intensive therapy program of at least 3 hrs of therapy 5 days a week? Yes The potential for patient to make measurable gains while on inpatient rehab is good and fair  Anticipated functional outcomes upon discharge from inpatient rehab: supervision PT, supervision OT, supervision SLP Estimated rehab length of stay to reach the above functional goals is: 6-9 days Anticipated discharge  destination: Home 10. Overall Rehab/Functional Prognosis: good and fair   MD Signature:

## 2022-02-16 NOTE — Progress Notes (Signed)
ANTICOAGULATION CONSULT NOTE - Follow Up Consult  Pharmacy Consult for heparin  Indication: atrial fibrillation  No Known Allergies  Patient Measurements: Weight: 64.4 kg (141 lb 15.6 oz) Heparin Dosing Weight: 68.1 kg  Vital Signs: Temp: 98.5 F (36.9 C) (05/25 0004) Temp Source: Oral (05/25 0004) BP: 120/78 (05/25 0400) Pulse Rate: 93 (05/25 0400)  Labs: Recent Labs    02/14/22 0441 02/14/22 0809 02/14/22 1250 02/15/22 0336 02/15/22 0956 02/16/22 0354  HGB 11.4*  --   --  13.0 13.6 12.6  HCT 36.0  --   --  39.4 40.0 38.4  PLT 338  --   --  363  --  400  HEPARINUNFRC 0.39  --   --  0.30  --  0.24*  CREATININE  --    < > 1.49* 1.38*  --  1.09*   < > = values in this interval not displayed.     Estimated Creatinine Clearance: 55.4 mL/min (A) (by C-G formula based on SCr of 1.09 mg/dL (H)).  Assessment: 30 yof with a history of SVT, mitral regurg TR, AI, anxiety. Patient is presenting with tachycardia. Heparin per pharmacy consult placed for atrial fibrillation. Patient is not on anticoagulation prior to arrival.   Heparin level subtherapeutic: 0.24, no issues with infusion or s/sx of bleeding per RN. CBC stable In/out Afib on amiodarone drip   Goal of Therapy:  Heparin level 0.3-0.7 units/ml Monitor platelets by anticoagulation protocol: Yes   Plan:  Increase heparin gtt to 1200 units/hr  Daily heparin level and CBC Monitor for s/sx of bleeding    Ruben Im, PharmD Clinical Pharmacist 02/16/2022 5:13 AM Please check AMION for all [facility] Pharm[facility] numbers

## 2022-02-16 NOTE — Evaluation (Signed)
Occupational Therapy Evaluation Patient Details Name: Lisa Ayala MRN: [id number] DOB: January 07, 1966 Today's Date: 02/16/2022   History of Present Illness Pt is a 56 y.o. female admitted 02/08/22 with aflutter. Plan for TEE/DCCV, pt rapidly declined 5/17 with progressive dyspnea, flash pulmonary edema, and hypoxia requiring intubation; pt briefly suffered asystolic arrest during intubation. Pt with SVT 5/19 requiring emergent cardioversion; recurrent AF with RVR on 5/20 with DCCV attempted x3; spontaneous return to NSR 5/21. 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 ETT 5/17-5/23. AMS noted 5/24; head CT negative for acute abnormality, suspicion for subacute/chronic infarct L cerebellum. PMH includes severe MR, moderate TR, SVT.   Clinical Impression   Pt is unreliable historian and family not present during evaluation. Per PT note Pt is typically independent in ADL and mobility. Today she is at least mod A for grooming, max A for bathing/dressing, mod A for stand pivot transfer to [facility], max A in standing for rear peri care. Demonstrating deficits in cognition, balance, BUE coordination, and will benefit from skilled OT in the acute setting as well as afterwards at the AIR level to maximize safety and independence in ADL and functional transfers.      Recommendations for follow up therapy are one component of a multi-disciplinary discharge planning process, led by the attending physician.  Recommendations may be updated based on patient status, additional functional criteria and insurance authorization.   Follow Up Recommendations  Acute inpatient rehab (3hours/day)    Assistance Recommended at Discharge Frequent or constant Supervision/Assistance  Patient can return home with the following A lot of help with walking and/or transfers;A lot of help with bathing/dressing/bathroom;Assistance with cooking/housework;Assistance with feeding;Direct  supervision/assist for medications management;Direct supervision/assist for financial management;Assist for transportation;Help with stairs or ramp for entrance    Functional Status Assessment  Patient has had a recent decline in their functional status and demonstrates the ability to make significant improvements in function in a reasonable and predictable amount of time.  Equipment Recommendations  BSC/3in1;Other (comment) (defer to next venue of care)    Recommendations for Other Services Rehab consult     Precautions / Restrictions Precautions Precautions: Fall;Other (comment) Precaution Comments: watch HR; multiple lines Restrictions Weight Bearing Restrictions: No      Mobility Bed Mobility Overal bed mobility: Needs Assistance Bed Mobility: Supine to Sit, Sit to Supine     Supine to sit: Min assist, HOB elevated Sit to supine: Mod assist   General bed mobility comments: increased time and effort, cues for attention; minA for scooting hips to EOB, assist for BLE back to into bed    Transfers Overall transfer level: Needs assistance Equipment used: 1 person hand held assist Transfers: Sit to/from Stand, Bed to chair/wheelchair/BSC Sit to Stand: Mod assist     Step pivot transfers: Mod assist     General transfer comment: ModA for trunk elevation and HHA to stabilize; pivotal steps from bed to [facility] with bilateral HHA and modA to prevent posterior LOB; max verbal cues for sequencing      Balance Overall balance assessment: Needs assistance   Sitting balance-Leahy Scale: Fair     Standing balance support: During functional activity, Single extremity supported Standing balance-Leahy Scale: Poor Standing balance comment: reliant on external assist to maintain static standing                           ADL either performed or assessed with  clinical judgement   ADL Overall ADL's : Needs assistance/impaired Eating/Feeding: NPO   Grooming: Wash/dry  face;Moderate assistance;Sitting Grooming Details (indicate cue type and reason): able to use wash cloth to wipe nose, but unable to bring to eyes without assist Upper Body Bathing: Moderate assistance   Lower Body Bathing: Moderate assistance   Upper Body Dressing : Maximal assistance   Lower Body Dressing: Maximal assistance   Toilet Transfer: Moderate assistance;Stand-pivot;BSC/3in1 Toilet Transfer Details (indicate cue type and reason): to the left Toileting- Clothing Manipulation and Hygiene: Maximal assistance;+2 for safety/equipment;Sit to/from stand Toileting - Clothing Manipulation Details (indicate cue type and reason): OT in front for balance/boost, NT in back performing peri care     Functional mobility during ADLs:  (SPT only this session) General ADL Comments: cognition impacting ability to perform ADL as well as balance, coordination, strength     Vision   Vision Assessment?: Vision impaired- to be further tested in functional context Additional Comments: tracks therapist, makes intentional eye contact, continue to assess     Perception     Praxis      Pertinent Vitals/Pain Pain Assessment Pain Assessment: Faces Faces Pain Scale: Hurts a little bit Pain Location: general vs fatigue Pain Descriptors / Indicators: Discomfort, Grimacing Pain Intervention(s): Monitored during session, Repositioned     Hand Dominance Left   Extremity/Trunk Assessment Upper Extremity Assessment Upper Extremity Assessment: RUE deficits/detail;LUE deficits/detail;Generalized weakness RUE Deficits / Details: able to achieve full B shoulder flex/ext, noted poor eccentric control with lowering; apparent dysmetria with RUE, grasp grossly 3+/5 RUE Sensation: WNL;decreased proprioception RUE Coordination: decreased fine motor;decreased gross motor LUE Deficits / Details: able to achieve full B shoulder flex/ext, noted poor eccentric control with lowering, grasp weak grossly   Lower  Extremity Assessment Lower Extremity Assessment: Defer to PT evaluation       Communication Communication Communication: Expressive difficulties (soft, hoarse voice)   Cognition Arousal/Alertness: Awake/alert Behavior During Therapy: Flat affect Overall Cognitive Status: Impaired/Different from baseline Area of Impairment: Orientation, Following commands, Safety/judgement, Awareness, Problem solving                 Orientation Level: Disoriented to, Situation, Time     Following Commands: Follows one step commands consistently, Follows one step commands with increased time Safety/Judgement: Decreased awareness of safety, Decreased awareness of deficits Awareness: Intellectual Problem Solving: Difficulty sequencing, Requires verbal cues, Requires tactile cues, Slow processing, Decreased initiation General Comments: pt not answering questions appropriately, Pt pleasant and cooperative throughout session, using more words, but they are not always appropriate for question asked "is this a more comfortable position - Lets get out of here" pt following majority of simple commands with some increased multimodal cues     General Comments  HR 113-135 throughout session. SpO2 >90% on 4L    Exercises     Shoulder Instructions      Home Living Family/patient expects to be discharged to:: Private residence Living Arrangements: Children                               Additional Comments: pt reports living with daughter (unable to state daughter's name), unable to answer home set-up or PLOF questions      Prior Functioning/Environment Prior Level of Function : Patient poor historian/Family not available             Mobility Comments: RN reports daughter stated pt "is usually the life of the party", independent  without DME          OT Problem List: Decreased strength;Decreased range of motion;Decreased activity tolerance;Impaired balance (sitting and/or  standing);Decreased coordination;Decreased cognition;Decreased safety awareness;Decreased knowledge of use of DME or AE;Decreased knowledge of precautions;Cardiopulmonary status limiting activity      OT Treatment/Interventions: Self-care/ADL training;DME and/or AE instruction;Therapeutic activities;Cognitive remediation/compensation;Patient/family education;Balance training    OT Goals(Current goals can be found in the care plan section) Acute Rehab OT Goals Patient Stated Goal: get out OT Goal Formulation: With patient Time For Goal Achievement: 03/02/22 Potential to Achieve Goals: Good ADL Goals Pt Will Perform Grooming: sitting;with set-up Pt Will Perform Upper Body Dressing: with supervision;sitting Pt Will Perform Lower Body Dressing: with supervision;sit to/from stand Pt Will Transfer to Toilet: with supervision;ambulating Pt Will Perform Toileting - Clothing Manipulation and hygiene: with min guard assist;sit to/from stand Additional ADL Goal #1: Pt will follow multistep commands with 75% accuracy  OT Frequency: Min 2X/week    Co-evaluation              AM-PAC OT "6 Clicks" Daily Activity     Outcome Measure Help from another person eating meals?: Total (NPO) Help from another person taking care of personal grooming?: A Lot Help from another person toileting, which includes using toliet, bedpan, or urinal?: A Lot Help from another person bathing (including washing, rinsing, drying)?: A Lot Help from another person to put on and taking off regular upper body clothing?: A Lot Help from another person to put on and taking off regular lower body clothing?: A Lot 6 Click Score: 11   End of Session Equipment Utilized During Treatment: Gait belt;Oxygen (4L) Nurse Communication: Mobility status  Activity Tolerance: Patient tolerated treatment well Patient left: in bed;with call bell/phone within reach;with bed alarm set;with SCD's reapplied  OT Visit Diagnosis: Unsteadiness  on feet (R26.81);Other abnormalities of gait and mobility (R26.89);Muscle weakness (generalized) (M62.81);Other symptoms and signs involving cognitive function                Time: [id number] OT Time Calculation (min): 26 min Charges:  OT General Charges $OT Visit: 1 Visit OT Evaluation $OT Eval Moderate Complexity: 1 Mod OT Treatments $Self Care/Home Management : 8-22 mins  Jesse Sans OTR/L Acute Rehabilitation Services Pager: [phone] Office: [place] 02/16/2022, 1:59 PM

## 2022-02-16 NOTE — Progress Notes (Addendum)
IP rehab admissions - I met with patient today.  At this point, she wants to go home and not admit to CIR.  She has no insurance; she is applying for medicaid.  She wants to get home to her cats.  I will call family to discuss rehab option.  Will have my partner follow up again tomorrow.  Call for questions.  [phone]  I called and spoke with patient's daughter by phone.  Dtr will discuss rehab with patient.  Will follow along for progress.  [phone]

## 2022-02-17 ENCOUNTER — Inpatient Hospital Stay (HOSPITAL_COMMUNITY): Payer: Medicaid Other

## 2022-02-17 DIAGNOSIS — I483 Typical atrial flutter: Secondary | ICD-10-CM

## 2022-02-17 LAB — COOXEMETRY PANEL
Carboxyhemoglobin: 2.1 % — ABNORMAL HIGH (ref 0.5–1.5)
Methemoglobin: 0.7 % (ref 0.0–1.5)
O2 Saturation: 66 %
Total hemoglobin: 11.5 g/dL — ABNORMAL LOW (ref 12.0–16.0)

## 2022-02-17 LAB — GLUCOSE, CAPILLARY
Glucose-Capillary: 114 mg/dL — ABNORMAL HIGH (ref 70–99)
Glucose-Capillary: 99 mg/dL (ref 70–99)
Glucose-Capillary: 113 mg/dL — ABNORMAL HIGH (ref 70–99)
Glucose-Capillary: 116 mg/dL — ABNORMAL HIGH (ref 70–99)
Glucose-Capillary: 94 mg/dL (ref 70–99)

## 2022-02-17 LAB — BASIC METABOLIC PANEL
Anion gap: 10 (ref 5–15)
BUN: 28 mg/dL — ABNORMAL HIGH (ref 6–20)
CO2: 26 mmol/L (ref 22–32)
Calcium: 8.6 mg/dL — ABNORMAL LOW (ref 8.9–10.3)
Chloride: 100 mmol/L (ref 98–111)
Creatinine, Ser: 1.07 mg/dL — ABNORMAL HIGH (ref 0.44–1.00)
GFR, Estimated: >60 mL/min (ref >60)
Glucose, Bld: 107 mg/dL — ABNORMAL HIGH (ref 70–99)
Potassium: 3.8 mmol/L (ref 3.5–5.1)
Sodium: 136 mmol/L (ref 135–145)

## 2022-02-17 LAB — HEPATIC FUNCTION PANEL
ALT: 27 U/L (ref 0–44)
AST: 24 U/L (ref 15–41)
Albumin: 2.2 g/dL — ABNORMAL LOW (ref 3.5–5.0)
Alkaline Phosphatase: 111 U/L (ref 38–126)
Bilirubin, Direct: 0.1 mg/dL (ref 0.0–0.2)
Indirect Bilirubin: 0.6 mg/dL (ref 0.3–0.9)
Total Bilirubin: 0.7 mg/dL (ref 0.3–1.2)
Total Protein: 6.5 g/dL (ref 6.5–8.1)

## 2022-02-17 LAB — CBC
HCT: 34.9 % — ABNORMAL LOW (ref 36.0–46.0)
Hemoglobin: 11.2 g/dL — ABNORMAL LOW (ref 12.0–15.0)
MCH: 28.5 pg (ref 26.0–34.0)
MCHC: 32.1 g/dL (ref 30.0–36.0)
MCV: 88.8 fL (ref 80.0–100.0)
Platelets: 409 10*3/uL — ABNORMAL HIGH (ref 150–400)
RBC: 3.93 MIL/uL (ref 3.87–5.11)
RDW: 18.6 % — ABNORMAL HIGH (ref 11.5–15.5)
WBC: 22 10*3/uL — ABNORMAL HIGH (ref 4.0–10.5)
nRBC: 0.1 % (ref 0.0–0.2)

## 2022-02-17 LAB — HEPARIN LEVEL (UNFRACTIONATED): Heparin Unfractionated: 0.44 IU/mL (ref 0.30–0.70)

## 2022-02-17 LAB — PHOSPHORUS: Phosphorus: 1.7 mg/dL — ABNORMAL LOW (ref 2.5–4.6)

## 2022-02-17 LAB — TRIGLYCERIDES: Triglycerides: 109 mg/dL (ref <150)

## 2022-02-17 LAB — MAGNESIUM: Magnesium: 2 mg/dL (ref 1.7–2.4)

## 2022-02-17 MED ORDER — SORBITOL 70 % SOLN
30.0000 mL | Freq: Once | Status: AC
  Administered 2022-02-17: 30 mL
  Filled 2022-02-17: qty 30

## 2022-02-17 MED ORDER — BISACODYL 10 MG RE SUPP
10.0000 mg | Freq: Once | RECTAL | Status: AC
  Administered 2022-02-17: 10 mg via RECTAL
  Filled 2022-02-17: qty 1

## 2022-02-17 MED ORDER — DEXTROSE 5 % IV SOLN
15.0000 mmol | Freq: Once | INTRAVENOUS | Status: AC
  Administered 2022-02-17: 15 mmol via INTRAVENOUS
  Filled 2022-02-17: qty 5

## 2022-02-17 NOTE — Progress Notes (Signed)
Inpatient Rehab Admissions Coordinator:   I spoke with daughter regarding potential CIR admit. She wants to discuss with pt. Today now that she's more alert. I will follow up over the weekend.   Megan Salon, MS, CCC-SLP Rehab Admissions Coordinator  [phone] (celll) [phone] (office)

## 2022-02-17 NOTE — Progress Notes (Addendum)
SLP Cancellation Note  Patient Details Name: Lisa Ayala MRN: [id number] DOB: 07-24-1966   Cancelled treatment:        Pt  has had emesis and is NPO for abdominal ultrasound. Per RN, pt stated she can taste the tube feeds. Ice chip made her gag and questioning if she needs an EGD (?). Will continue efforts.    Houston Siren 02/17/2022, 12:26 PM

## 2022-02-17 NOTE — Progress Notes (Signed)
NAME:  Lisa Ayala, MRN:  [id number], DOB:  June 03, 1966, LOS: 10 ADMISSION DATE:  02/07/2022, CONSULTATION DATE:  5/17 REFERRING MD:  Dr. Gardiner Rhyme, CHIEF COMPLAINT:  hypoxia   History of Present Illness:  56 year old female with PMH as below, which is significant for severe MR, moderate TR, and SVT. She had an episode of SVT in October of 22 and as part of her cardiology follow up she was noted to have murmur. Echo showed extensive valvular disease including wide open MR. She unfortunately missed her scheduled TEE. Presented to EP clinic 5/16 with complaints of palpitations and dyspnea. She was found to be in a rapid atrial rhythm and was sent to ED for further evaluation. In the ED she was worked up for SVT and had several rounds of adenosine given with some evidence of atrial flutter when rate slowed. She was started on heparin for flutter and was planned for TEE/DCCV. Prior to the case beginning on 5/17 she rapidly declined with progressive dyspnea, flash pulmonary edema, and hypoxia requiring intubation. Briefly suffered asystolic arrest during intubation. PCCM asked to evaluate for vent management.   Pertinent  Medical History   has a past medical history of Aortic insufficiency, PSVT (paroxysmal supraventricular tachycardia) (HCC), Severe mitral regurgitation, and Tricuspid regurgitation.  Significant Hospital Events: Including procedures, antibiotic start and stop dates in addition to other pertinent events   5/16 admit for A flutter 5/17 decompensated in endoscopy prior to TEE. Intubated. Brief asystolic arrest.  0000000 SVT requiring emergent cardioversion 5/24 No major issues overnight, remains in A-fib RVR despite amio and Dilt drips  5/25 No issues overnight  Interim History / Subjective:  Having Bms. More abd pain this AM in RUQ.  Objective   Blood pressure 122/69, pulse 70, temperature 98.7 F (37.1 C), temperature source Oral, resp. rate (!) 27, weight 63.4 kg, SpO2 92 %. CVP:   [1 mmHg-29 mmHg] 2 mmHg      Intake/Output Summary (Last 24 hours) at 02/17/2022 0829 Last data filed at 02/17/2022 0454 Gross per 24 hour  Intake 1783.28 ml  Output 550 ml  Net 1233.28 ml    Filed Weights   02/15/22 0500 02/16/22 0500 02/17/22 0500  Weight: 64.4 kg 63 kg 63.4 kg    Examination: Chronically ill No distress, lungs clear Heart regular, +murmur, in sinus on monitor RUQ TTP on exam, otherwise soft, +BS Ext warm, no edema, +muscle wasting Oriented, moves all 4 ext to command  Resolved Hospital Problem list     Assessment & Plan:   Acute hypoxemic respiratory failure secondary to flash pulmonary edema and ?HAP- resolved Severe MR due to rheumatic heart dz resulting in cardiogenic shock and refractory SVT- seems to be settling down Brief PEA arrest 5/17-ACLS provided x1 min with ROSC achieved  Atrial flutter with RVR-CHA2DS2VASc score 1 Substance abuse: THC and cocaine on UDS, ICU delirium- improved mentation over last couple days Constipation improved New RUQ pain, persistent leukocytosis- maybe acaclulous cholecystitis? - Mobility as able - Check LFTs, RUQ [place], hold further reglan - Continue bowel regimen as ordered - Fluid status, antiarrythmics, AC and BP control per CHF team - Encourage day/night cycles - Zosyn to finish after 7 days - Eventual goal is to condition her so she can tolerate mitral valve replacement  Best Practice (right click and "Reselect all SmartList Selections" daily)   Diet/type: TF DVT prophylaxis: systemic heparin GI prophylaxis: PPI Lines: Central line and Arterial Line Foley:  Yes, and it is still  needed Code Status:  full code Last date of multidisciplinary goals of care discussion:  Continue to update patient and family daily  Critical care time: NA  Erskine Emery MD [facility] Personal contact information can be found on Amion  02/17/2022, 8:29 AM

## 2022-02-17 NOTE — Progress Notes (Signed)
Physical Therapy Treatment Patient Details Name: Lisa Ayala MRN: [id number] DOB: [birth year] Today's Date: 02/17/2022   History of Present Illness Pt is a 56 y.o. female admitted 02/08/22 with aflutter. Plan for TEE/DCCV, pt rapidly declined 5/17 with progressive dyspnea, flash pulmonary edema, and hypoxia requiring intubation; pt briefly suffered asystolic arrest during intubation. Pt with SVT 5/19 requiring emergent cardioversion; recurrent AF with RVR on 5/20 with DCCV attempted x3; spontaneous return to NSR 5/21. 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 ETT 5/17-5/23. AMS noted 5/24; head CT negative for acute abnormality, suspicion for subacute/chronic infarct L cerebellum. PMH includes severe MR, moderate TR, SVT.   PT Comments    Pt progressing well with mobility. Today's session focused on transfer and gait training; pt with frequent LOB requiring min-modA to prevent fall. Pt easily distracted in busy hallway environments, requiring max verbal cues for sequencing and safety. Noted improvements in conversation and cognition; pt's daughter present and supportive. Pt remains limited by generalized weakness, decreased activity tolerance, poor balance strategies/postural reactions and impaired cognition, including poor attention, difficulty problem solving and decreased awareness. Pt remains an excellent candidate for intensive AIR-level therapies to maximize functional mobility and independence prior to return home.  HR 71, SpO2 92% on RA post-ambulation    Recommendations for follow up therapy are one component of a multi-disciplinary discharge planning process, led by the attending physician.  Recommendations may be updated based on patient status, additional functional criteria and insurance authorization.  Follow Up Recommendations  Acute inpatient rehab (3hours/day)     Assistance Recommended at Discharge Frequent or constant  Supervision/Assistance  Patient can return home with the following A little help with walking and/or transfers;A little help with bathing/dressing/bathroom;Assistance with cooking/housework;Direct supervision/assist for medications management;Direct supervision/assist for financial management;Assist for transportation;Help with stairs or ramp for entrance   Equipment Recommendations  Rolling walker (2 wheels);BSC/3in1 (TBD)    Recommendations for Other Services       Precautions / Restrictions Precautions Precautions: Fall;Other (comment) Precaution Comments: watch SpO2 (does not wear O2 baseline) Restrictions Weight Bearing Restrictions: No     Mobility  Bed Mobility Overal bed mobility: Needs Assistance Bed Mobility: Supine to Sit     Supine to sit: Supervision, HOB elevated     General bed mobility comments: increased time and effort with cues to complete task    Transfers Overall transfer level: Needs assistance Equipment used: Rolling walker (2 wheels) Transfers: Sit to/from Stand Sit to Stand: Mod assist, Min assist           General transfer comment: Performed multiple sit<>stands during session; pt requiring initial modA for trunk elevation and stability standing from EOB as pt pulling on RW despite cues; additional sit<>stands from recliner with repeated, max verbal cues for hand placement, minA for stability    Ambulation/Gait Ambulation/Gait assistance: Min assist, Mod assist Gait Distance (Feet): 90 Feet (+ 80) Assistive device: Rolling walker (2 wheels) Gait Pattern/deviations: Step-to pattern, Step-through pattern, Shuffle, Ataxic, Scissoring Gait velocity: Decreased     General Gait Details: slow, unsteady gait with RW and frequent min-modA to prevent LOB and manage RW; pt with mild ataxia and frequent scissoring requiring repeated verbal cues for "wide steps"; pt also easily distracted in busy hallway environment, (+) LOB everytime pt looked behind  her requiring external assist to prevent fall; 1x seated rest break secondary to fatigue; +2 chair follow from daughter   Stairs  Wheelchair Mobility    Modified Rankin (Stroke Patients Only)       Balance Overall balance assessment: Needs assistance   Sitting balance-Leahy Scale: Fair     Standing balance support: No upper extremity supported, Single extremity supported, Bilateral upper extremity supported, During functional activity Standing balance-Leahy Scale: Poor Standing balance comment: reliant on UE support and/or external assist, unable to accept challenge                            Cognition Arousal/Alertness: Awake/alert Behavior During Therapy: Flat affect Overall Cognitive Status: Impaired/Different from baseline Area of Impairment: Orientation, Attention, Following commands, Safety/judgement, Awareness, Problem solving                 Orientation Level: Disoriented to, Time, Situation Current Attention Level: Selective   Following Commands: Follows one step commands consistently, Follows one step commands with increased time Safety/Judgement: Decreased awareness of safety, Decreased awareness of deficits Awareness: Intellectual Problem Solving: Slow processing, Difficulty sequencing, Requires verbal cues General Comments: improving cognition compared to yesterday's session, conversant; able to state her name, daughter's name, [place]; reoriented today is Friday, pt needing increased time to figure out where she lives. pt following majority of simple commands with increased cues for problem solving; pleasant and cooperative. required cues to recall working with therapy yesterday        Exercises      General Comments General comments (skin integrity, edema, etc.): SpO2 92% on RA post-ambulation; RN aware and ok to leave O2 [place] off. Pt's daughter present and supportive      Pertinent Vitals/Pain Pain Assessment Pain  Assessment: Faces Faces Pain Scale: Hurts a little bit Pain Location: abdomen Pain Descriptors / Indicators: Discomfort Pain Intervention(s): Monitored during session    Home Living                          Prior Function            PT Goals (current goals can now be found in the care plan section) Progress towards PT goals: Progressing toward goals    Frequency    Min 3X/week      PT Plan Current plan remains appropriate    Co-evaluation              AM-PAC PT "6 Clicks" Mobility   Outcome Measure  Help needed turning from your back to your side while in a flat bed without using bedrails?: A Little Help needed moving from lying on your back to sitting on the side of a flat bed without using bedrails?: A Little Help needed moving to and from a bed to a chair (including a wheelchair)?: A Little Help needed standing up from a chair using your arms (e.g., wheelchair or bedside chair)?: A Lot Help needed to walk in hospital room?: A Lot Help needed climbing 3-5 steps with a railing? : Total 6 Click Score: 14    End of Session Equipment Utilized During Treatment: Gait belt;Oxygen Activity Tolerance: Patient tolerated treatment well Patient left: in chair;with call bell/phone within reach;with chair alarm set;with family/visitor present Nurse Communication: Mobility status PT Visit Diagnosis: Other abnormalities of gait and mobility (R26.89);Muscle weakness (generalized) (M62.81)     Time: 3014-9969 PT Time Calculation (min) (ACUTE ONLY): 31 min  Charges:  $Gait Training: 8-22 mins $Therapeutic Activity: 8-22 mins  Ina HomesJaclyn Nery Kalisz, PT, DPT Acute Rehabilitation Services  Pager [phone] Office [phone]  Malachy ChamberJaclyn L Tennille Montelongo 02/17/2022, 4:28 PM

## 2022-02-17 NOTE — Progress Notes (Addendum)
Patient ID: AYOMIKUN LOYAL, female   DOB: 1966/02/01, 56 y.o.   MRN: [id number]     Advanced Heart Failure Rounding Note  PCP-Cardiologist: Will Meredith Leeds, MD   Subjective:    - 5/17 Flash pulmonary edema. PEA arrest. Intubated. ACLS 1-2 min. ROSC. Placed on milrinone, norepi, and lasix drip.  - 5/19 LHC/RHC: Nonobstructive CAD. RA 9, PA 50/10, mean PCWP 12, CI 6.8.  Rapid SVT, underwent emergent DCCV.  - 5/20: Recurrent AF with RVR, DCCV attempted x 3 but unable to hold NSR.  - 5/21: Spontaneous conversion to NSR - 5/22: Back in AF with RVR - 05/23: Extubated. Milrinone off. Dilt gtt + amio gtt for AF w/ RVR.   Converted to NSR overnight. HR 90s  On Zosyn for PNA. Respiratory culture grew Group C Strep. BC NGTD.   Remains AF. WBC trending down, 28>>22K. PCT down 4.97>>3.64   Co-ox 66% off pressors/inotropes. CVP 2 overnight.   SCr 0.99>>1.07   K 3.8 Mg 2.0   Feels tried this morning. Did not sleep well last night.   PT recommending CIR   Echo: 5/18 EF 60-65%, RV mild HK, rheumatic MV, severe MR, Mild AI     Objective:   Weight Range: 63.4 kg Body mass index is 21.89 kg/m.   Vital Signs:   Temp:  [97.6 F (36.4 C)-98.8 F (37.1 C)] 98.7 F (37.1 C) (05/26 0724) Pulse Rate:  [70-147] 70 (05/26 0400) Resp:  [19-42] 27 (05/26 0600) BP: (111-139)/(60-100) 122/69 (05/26 0600) SpO2:  [46 %-100 %] 92 % (05/26 0600) Arterial Line BP: (101-132)/(64-97) 107/97 (05/25 1100) Weight:  [63.4 kg] 63.4 kg (05/26 0500) Last BM Date : 02/10/22  Weight change: Filed Weights   02/15/22 0500 02/16/22 0500 02/17/22 0500  Weight: 64.4 kg 63 kg 63.4 kg    Intake/Output:   Intake/Output Summary (Last 24 hours) at 02/17/2022 0741 Last data filed at 02/17/2022 0454 Gross per 24 hour  Intake 1903.82 ml  Output 550 ml  Net 1353.82 ml      Physical Exam   CVP 2  General:  fatigued looking. No respiratory difficulty HEENT: normal + cor trak, dentition in poor repair   Neck: supple. no JVD. Carotids 2+ bilat; no bruits. No lymphadenopathy or thyromegaly appreciated. Cor: PMI nondisplaced. Regular rate & rhythm. 3/6 MR murmur Lungs: clear Abdomen: soft, nontender, nondistended. No hepatosplenomegaly. No bruits or masses. Good bowel sounds. Extremities: no cyanosis, clubbing, rash, edema Neuro: alert & oriented x 3, cranial nerves grossly intact. moves all 4 extremities w/o difficulty. Affect pleasant.   Telemetry   NSR 90s personally reviewed   Labs    CBC Recent Labs    02/16/22 0354 02/17/22 0527  WBC 28.5* 22.0*  HGB 12.6 11.2*  HCT 38.4 34.9*  MCV 87.7 88.8  PLT 400 [id number]*   Basic Metabolic Panel Recent Labs    02/16/22 1557 02/16/22 2001 02/17/22 0527  NA  --  136 136  K  --  3.4* 3.8  CL  --  101 100  CO2  --  26 26  GLUCOSE  --  124* 107*  BUN  --  26* 28*  CREATININE  --  1.06* 1.07*  CALCIUM  --  8.5* 8.6*  MG 2.1  --  2.0  PHOS 1.7*  --  1.7*   Liver Function Tests No results for input(s): AST, ALT, ALKPHOS, BILITOT, PROT, ALBUMIN in the last 72 hours.  No results for input(s): LIPASE, AMYLASE in the last  72 hours. Cardiac Enzymes No results for input(s): CKTOTAL, CKMB, CKMBINDEX, TROPONINI in the last 72 hours.  BNP: BNP (last 3 results) Recent Labs    07/02/21 1335 02/08/22 0351  BNP 1,157.2* 709.9*    ProBNP (last 3 results) No results for input(s): PROBNP in the last 8760 hours.   D-Dimer No results for input(s): DDIMER in the last 72 hours. Hemoglobin A1C No results for input(s): HGBA1C in the last 72 hours. Fasting Lipid Panel Recent Labs    02/17/22 0527  TRIG 109   Thyroid Function Tests No results for input(s): TSH, T4TOTAL, T3FREE, THYROIDAB in the last 72 hours.  Invalid input(s): FREET3  Other results:   Imaging    No results found.   Medications:     Scheduled Medications:  chlorhexidine  15 mL Mouth Rinse BID   Chlorhexidine Gluconate Cloth  6 each Topical Daily    docusate  100 mg Per Tube BID   feeding supplement (PROSource TF)  45 mL Per Tube BID   insulin aspart  0-15 Units Subcutaneous Q4H   mouth rinse  15 mL Mouth Rinse q12n4p   metoCLOPramide (REGLAN) injection  10 mg Intravenous Q8H   pantoprazole (PROTONIX) IV  40 mg Intravenous QHS   polyethylene glycol  17 g Per Tube Daily   sodium chloride flush  3 mL Intravenous Q12H   spironolactone  12.5 mg Per Tube Daily    Infusions:  amiodarone 60 mg/hr (02/17/22 0454)   diltiazem (CARDIZEM) infusion 15 mg/hr (02/17/22 0454)   feeding supplement (VITAL AF 1.2 CAL) 1,000 mL (02/16/22 1645)   heparin 1,200 Units/hr (02/17/22 0454)   piperacillin-tazobactam (ZOSYN)  IV 3.375 g (02/17/22 0533)    PRN Medications: acetaminophen, bisacodyl, hydrALAZINE, ondansetron (ZOFRAN) IV    Patient Profile   56 y/o woman with severe MR and polysubstance abuse Lost to f/u. Admitted with AFL with RVR -> cardiogenic shock  Assessment/Plan   1. Shock, cardiogenic - in setting of severe MR and flash pulmonary edema. Lactic acid 2.6>1.9  - component of septic shock due to PNA. Respiratory + Group C Strep - CO-OX stable 66% off milrinone.  - CVP 2 today. Hold diuretics  2. PEA Arrest -ACLS x1 min with ROSC -Now recovered    3.  Acute Hypoxic Respiratory Failure due to flash pulmonary edema/PNA -Urgent intubation 5/17, extubated 05/23. O2 stable 3L [place] -Green sputum with fever, leukocytosis. PCT 4 => suspect PNA. Zosyn started. WBC 16.5>17>18.8>28>>22K -Respiratory Cx + Group C Strep.  - Continue Zosyn.    4.  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  - Seen by TCTS. Plan MVR once recovered from acute illness  - needs rehab in CIR prior to surgery     5 . PAF/SVT - A flutter on admit. Spontaneously converted to SR.  - Repeat DC-CV for rapid SVT 5/19.  - She  went into AF with RVR 5/20 early am, rate in the 140s-160s.  DCCV attempted again on 5/20 but failed. - NSR today  - continue dilt and amio gtts  - rhythm control difficult w/ severe MR/severe LAE. Will need MAZE at time of MVR.  - Continue heparin gtt.  6.  AKI due to ATN/shock -Creatinine 0.96--->1.8 -> 1.49 -> 1.34 -> 1.64->1.5 ->1.4->1.0->0.99->1.07 -Avoid hypotension.   7. Tobacco/Polysubstance abuse  - tox screen +cocaine/THC  8. Hypokalemia  - K 3.8 today  - continue spiro   9. Anxiety - severe   10. ID - Suspect PNA - CXR with bibasilar opacities.  - Febrile with PCT 4, WBCs 17.7, green sputum => started Zosyn.  - Respiratory Cx + Group C Strep.  - WBC and PCT both downtrending  - Continue Zosyn  11. GI - Episode emesis this am. Primary contents TF.  - No BM since admit   12. Hypertension - BP improving after diltiazem gtt added for AF with RVR - Can give IV hydralazine PRN.   13. RUE swelling - Venous duplex + acute superficial vein thrombosis. No DVT   - supportive care    14. Deconditioning - will need CIR   Length of Stay: [place], PA-C  02/17/2022, 7:41 AM  Advanced Heart Failure Team Pager [phone] (M-F; 7a - 5p)  Please contact [facility] for night-coverage after hours (5p -7a ) and weekends on [email or web address]  Patient seen and examined with the above-signed Advanced Practice Provider and/or Housestaff. I personally reviewed laboratory data, imaging studies and relevant notes. I independently examined the patient and formulated the important aspects of the plan. I have edited the note to reflect any of my changes or salient points. I have personally discussed the plan with the patient and/or family.  Back in NSR this am. Having severe RUQ pain. Denies CP or SOB. Had small BM overnight. Co-ox and CVP ok   General:  Weak appearing. No resp difficulty HEENT: normal + cor-trak Neck: supple. no JVD. Carotids 2+ bilat; no bruits. No  lymphadenopathy or thryomegaly appreciated. Cor: PMI nondisplaced. Regular rate & rhythm. No rubs, gallops or murmurs. Lungs: clear Abdomen: soft, + tender RUQ, nondistended. No hepatosplenomegaly. No bruits or masses. Good bowel sounds. Extremities: no cyanosis, clubbing, rash, edema Neuro: alert & orientedx3, cranial nerves grossly intact. moves all 4 extremities w/o difficulty. Affect pleasant  Stable currently from a cardiac perspective. Will continue IV amio and heparin. Plan RUQ u/s. Continue IV abx.  D/w CCM.   Glori Bickers, MD  8:26 AM

## 2022-02-17 NOTE — TOC Progression Note (Signed)
Transition of Care [facility]) - Progression Note    Patient Details  Name: Lisa Ayala MRN: [id number] Date of Birth: 09-13-1966  [facility] CM/SW Contact  Girl Schissler, LCSW Phone Number: 02/17/2022, 3:29 PM  Clinical Narrative:    HF CSW attempted to speak with Lisa Ayala at bedside however she is currently working with PT and CSW will try again at a later time. Lisa Ayala is a bit more oriented today than she has been previously.  CSW will continue to follow through DC.   Expected Discharge Plan: Skilled Nursing Facility Barriers to Discharge: Continued Medical Work up  Expected Discharge Plan and Services Expected Discharge Plan: [place]                                               Social Determinants of Health (SDOH) Interventions    Readmission Risk Interventions     View : No data to display.

## 2022-02-17 NOTE — Progress Notes (Signed)
ANTICOAGULATION CONSULT NOTE - Follow Up Consult  Pharmacy Consult for heparin  Indication: atrial fibrillation  No Known Allergies  Patient Measurements: Weight: 63.4 kg (139 lb 12.4 oz) Heparin Dosing Weight: 68.1 kg  Vital Signs: Temp: 98.7 F (37.1 C) (05/26 0724) Temp Source: Oral (05/26 0724) BP: 139/75 (05/26 0800) Pulse Rate: 72 (05/26 0800)  Labs: Recent Labs    02/15/22 0336 02/15/22 0956 02/16/22 0354 02/16/22 0530 02/16/22 2001 02/17/22 0527  HGB 13.0 13.6 12.6  --   --  11.2*  HCT 39.4 40.0 38.4  --   --  34.9*  PLT 363  --  400  --   --  409*  HEPARINUNFRC 0.30  --  0.24*  --   --  0.44  CREATININE 1.38*  --  1.09* 0.99 1.06* 1.07*     Estimated Creatinine Clearance: 56.4 mL/min (A) (by C-G formula based on SCr of 1.07 mg/dL (H)).  Assessment: 26 yof with a history of SVT, mitral regurg TR, AI, anxiety. Patient is presenting with tachycardia. Heparin per pharmacy consult placed for atrial fibrillation. Patient is not on anticoagulation prior to arrival.   Heparin level therapeutic at 0.44; no overt bleeding or complications noted.  CBC stable.  Goal of Therapy:  Heparin level 0.3-0.7 units/ml Monitor platelets by anticoagulation protocol: Yes   Plan:  Continue IV heparin gtt 1200 units/hr  Daily heparin level and CBC Monitor for s/sx of bleeding   Marguerite Olea, [facility] Clinical Pharmacist  02/17/2022 11:30 AM   [facility] pharmacy phone numbers are listed on [email or web address]

## 2022-02-18 LAB — BASIC METABOLIC PANEL
Anion gap: 12 (ref 5–15)
BUN: 19 mg/dL (ref 6–20)
CO2: 23 mmol/L (ref 22–32)
Calcium: 8.7 mg/dL — ABNORMAL LOW (ref 8.9–10.3)
Chloride: 102 mmol/L (ref 98–111)
Creatinine, Ser: 1.11 mg/dL — ABNORMAL HIGH (ref 0.44–1.00)
GFR, Estimated: 58 mL/min — ABNORMAL LOW (ref >60)
Glucose, Bld: 112 mg/dL — ABNORMAL HIGH (ref 70–99)
Potassium: 3 mmol/L — ABNORMAL LOW (ref 3.5–5.1)
Sodium: 137 mmol/L (ref 135–145)

## 2022-02-18 LAB — HEPARIN LEVEL (UNFRACTIONATED): Heparin Unfractionated: 0.41 IU/mL (ref 0.30–0.70)

## 2022-02-18 LAB — CBC
HCT: 33.5 % — ABNORMAL LOW (ref 36.0–46.0)
Hemoglobin: 10.8 g/dL — ABNORMAL LOW (ref 12.0–15.0)
MCH: 28.1 pg (ref 26.0–34.0)
MCHC: 32.2 g/dL (ref 30.0–36.0)
MCV: 87 fL (ref 80.0–100.0)
Platelets: 388 10*3/uL (ref 150–400)
RBC: 3.85 MIL/uL — ABNORMAL LOW (ref 3.87–5.11)
RDW: 18.7 % — ABNORMAL HIGH (ref 11.5–15.5)
WBC: 16.4 10*3/uL — ABNORMAL HIGH (ref 4.0–10.5)
nRBC: 0.1 % (ref 0.0–0.2)

## 2022-02-18 LAB — COOXEMETRY PANEL
Carboxyhemoglobin: 2.2 % — ABNORMAL HIGH (ref 0.5–1.5)
Methemoglobin: <0.7 % (ref 0.0–1.5)
O2 Saturation: 87.2 %
Total hemoglobin: 11.1 g/dL — ABNORMAL LOW (ref 12.0–16.0)

## 2022-02-18 LAB — PHOSPHORUS: Phosphorus: 2.4 mg/dL — ABNORMAL LOW (ref 2.5–4.6)

## 2022-02-18 LAB — MAGNESIUM: Magnesium: 2 mg/dL (ref 1.7–2.4)

## 2022-02-18 LAB — GLUCOSE, CAPILLARY
Glucose-Capillary: 100 mg/dL — ABNORMAL HIGH (ref 70–99)
Glucose-Capillary: 93 mg/dL (ref 70–99)
Glucose-Capillary: 98 mg/dL (ref 70–99)

## 2022-02-18 MED ORDER — POTASSIUM CHLORIDE 10 MEQ/50ML IV SOLN
10.0000 meq | INTRAVENOUS | Status: AC
  Administered 2022-02-18 (×3): 10 meq via INTRAVENOUS
  Filled 2022-02-18: qty 50

## 2022-02-18 MED ORDER — OLANZAPINE 10 MG IM SOLR
10.0000 mg | Freq: Once | INTRAMUSCULAR | Status: AC
  Administered 2022-02-18: 10 mg via INTRAMUSCULAR
  Filled 2022-02-18: qty 10

## 2022-02-18 MED ORDER — QUETIAPINE FUMARATE 50 MG PO TABS
50.0000 mg | ORAL_TABLET | Freq: Every day | ORAL | Status: DC
  Administered 2022-02-18 – 2022-02-20 (×3): 50 mg via ORAL
  Filled 2022-02-18 (×3): qty 1

## 2022-02-18 MED ORDER — DEXTROSE 5 % IV SOLN
15.0000 mmol | Freq: Once | INTRAVENOUS | Status: AC
  Administered 2022-02-18: 15 mmol via INTRAVENOUS
  Filled 2022-02-18: qty 5

## 2022-02-18 MED ORDER — POTASSIUM CHLORIDE 20 MEQ PO PACK
20.0000 meq | PACK | ORAL | Status: AC
  Administered 2022-02-18 (×2): 20 meq
  Filled 2022-02-18 (×2): qty 1

## 2022-02-18 MED ORDER — APIXABAN 5 MG PO TABS
5.0000 mg | ORAL_TABLET | Freq: Two times a day (BID) | ORAL | Status: DC
  Administered 2022-02-18 – 2022-02-21 (×7): 5 mg via ORAL
  Filled 2022-02-18 (×7): qty 1

## 2022-02-18 MED ORDER — DEXMEDETOMIDINE HCL IN NACL 400 MCG/100ML IV SOLN
0.4000 ug/kg/h | INTRAVENOUS | Status: DC
  Administered 2022-02-18: 1.2 ug/kg/h via INTRAVENOUS
  Administered 2022-02-18: 0.4 ug/kg/h via INTRAVENOUS
  Filled 2022-02-18: qty 100

## 2022-02-18 MED ORDER — MELATONIN 3 MG PO TABS: 3.0000 mg | ORAL_TABLET | Freq: Once | ORAL | Status: DC

## 2022-02-18 NOTE — Progress Notes (Signed)
SLP Cancellation Note  Patient Details Name: Lisa Ayala MRN: [id number] DOB: 1966/05/01   Cancelled treatment:       Reason Eval/Treat Not Completed:  (overnight pt with agitation, on Precedex)  Rolena Infante, MS [facility] SLP Acute Rehab Services Office [phone] Pager [phone]   Chales Abrahams 02/18/2022, 12:03 PM

## 2022-02-18 NOTE — Progress Notes (Signed)
NAME:  Lisa Ayala, MRN:  [id number], DOB:  Aug 30, 1966, LOS: 10 ADMISSION DATE:  02/07/2022, CONSULTATION DATE:  5/17 REFERRING MD:  Dr. Gardiner Rhyme, CHIEF COMPLAINT:  hypoxia   History of Present Illness:  56 year old female with PMH as below, which is significant for severe MR, moderate TR, and SVT. She had an episode of SVT in October of 22 and as part of her cardiology follow up she was noted to have murmur. Echo showed extensive valvular disease including wide open MR. She unfortunately missed her scheduled TEE. Presented to EP clinic 5/16 with complaints of palpitations and dyspnea. She was found to be in a rapid atrial rhythm and was sent to ED for further evaluation. In the ED she was worked up for SVT and had several rounds of adenosine given with some evidence of atrial flutter when rate slowed. She was started on heparin for flutter and was planned for TEE/DCCV. Prior to the case beginning on 5/17 she rapidly declined with progressive dyspnea, flash pulmonary edema, and hypoxia requiring intubation. Briefly suffered asystolic arrest during intubation. PCCM asked to evaluate for vent management.   Pertinent  Medical History   has a past medical history of Aortic insufficiency, PSVT (paroxysmal supraventricular tachycardia) (HCC), Severe mitral regurgitation, and Tricuspid regurgitation.  Significant Hospital Events: Including procedures, antibiotic start and stop dates in addition to other pertinent events   5/16 admit for A flutter 5/17 decompensated in endoscopy prior to TEE. Intubated. Brief asystolic arrest.  0000000 SVT requiring emergent cardioversion 5/24 No major issues overnight, remains in A-fib RVR despite amio and Dilt drips  5/25 No issues overnight 5/26 RUQ pain: imaging, labs neg, improved with BM 5/27 agitation overnight on precedex  Interim History / Subjective:  Restless and delirious overnight started on precedex. Seems much better controlled today,   Objective    Blood pressure 136/76, pulse 67, temperature 99.1 F (37.3 C), temperature source Oral, resp. rate 16, weight 62.6 kg, SpO2 91 %. CVP:  [4 mmHg-6 mmHg] 6 mmHg      Intake/Output Summary (Last 24 hours) at 02/18/2022 0920 Last data filed at 02/18/2022 0600 Gross per 24 hour  Intake 1581.91 ml  Output 1200 ml  Net 381.91 ml    Filed Weights   02/16/22 0500 02/17/22 0500 02/18/22 0500  Weight: 63 kg 63.4 kg 62.6 kg    Examination: No distress but restless Abdominal exam now benign Lungs clear Heart sounds regular Ext warm, no edema  BMP stable WBC improving  Resolved Hospital Problem list     Assessment & Plan:   Acute hypoxemic respiratory failure secondary to flash pulmonary edema and ?HAP- resolved Severe MR due to rheumatic heart dz resulting in cardiogenic shock and refractory SVT- seems to be settling down Brief PEA arrest 5/17-ACLS provided x1 min with ROSC achieved  Atrial flutter with RVR-CHA2DS2VASc score 1 Substance abuse: THC and cocaine on UDS, ICU delirium- worse again overnight Constipation improved  - Continue bowel regimen as ordered - Fluid status, antiarrythmics, AC and BP control per CHF team - Encourage day/night cycles, start qHS seroquel, wean precedex - Really needs mobility, RN to reach out to PT again - Has safety sitter now due to attempts to get out of bed and apparent fall risk - Zosyn to finish after 7 days - Eventual goal is to condition her so she can tolerate mitral valve replacement  Best Practice (right click and "Reselect all SmartList Selections" daily)   Diet/type: TF DVT prophylaxis: systemic  heparin, may be switching to eliquis GI prophylaxis: PPI Lines: Central line and Arterial Line Foley:  Yes, and it is still needed Code Status:  full code Last date of multidisciplinary goals of care discussion:  Continue to update patient and family daily  Critical care time: 13 minutes   Erskine Emery MD [facility] Personal contact information can be found on Amion  02/18/2022, 9:20 AM

## 2022-02-18 NOTE — Progress Notes (Signed)
[facility] ADULT ICU REPLACEMENT PROTOCOL   The patient does apply for the [facility] Adult ICU Electrolyte Replacment Protocol based on the criteria listed below:   1.Exclusion criteria: TCTS patients, ECMO patients, and Dialysis patients 2. Is GFR >/= 30 ml/min? Yes.    Patient's GFR today is 58 3. Is SCr </= 2? Yes.   Patient's SCr is 1.11 mg/dL 4. Did SCr increase >/= 0.5 in 24 hours? No. 5.Pt's weight >40kg  Yes.   6. Abnormal electrolyte(s): k 3.0  7. Electrolytes replaced per protocol 8.  Call MD STAT for K+ </= 2.5, Phos </= 1, or Mag </= 1 Physician:    Markus Daft A 02/18/2022 4:55 AM

## 2022-02-18 NOTE — Progress Notes (Signed)
eLink Physician-Brief Progress Note Patient Name: Lisa Ayala DOB: 19-Sep-1966 MRN: [id number]   Date of Service  02/18/2022  HPI/Events of Note  Patient with insomnia.  eICU Interventions  Melatonin 3 mg po x 1 ordered.        Thomasene Lot Daryle Boyington 02/18/2022, 2:40 AM

## 2022-02-18 NOTE — Progress Notes (Signed)
Inpatient Rehab Admissions Coordinator:    Pt. Not yet medically ready for CIR at this time (aggitated, requiring Precedex) and Pt.'s daughter has not yet decided if she wants to pursue CIR or not. I will continue to follow.   Clemens Catholic, [place], [place] Admissions Coordinator  [phone] ([place]) [phone] (office)

## 2022-02-18 NOTE — Progress Notes (Signed)
ANTICOAGULATION CONSULT NOTE - Follow Up Consult  Pharmacy Consult for heparin  Indication: atrial fibrillation  No Known Allergies  Patient Measurements: Weight: 62.6 kg (138 lb 0.1 oz) Heparin Dosing Weight: 68.1 kg  Vital Signs: Temp: 97.9 F (36.6 C) (05/27 1133) Temp Source: Oral (05/27 1133) BP: 92/63 (05/27 1100) Pulse Rate: 58 (05/27 1100)  Labs: Recent Labs    02/16/22 0354 02/16/22 0530 02/16/22 2001 02/17/22 0527 02/18/22 0320  HGB 12.6  --   --  11.2* 10.8*  HCT 38.4  --   --  34.9* 33.5*  PLT 400  --   --  409* 388  HEPARINUNFRC 0.24*  --   --  0.44 0.41  CREATININE 1.09*   < > 1.06* 1.07* 1.11*   < > = values in this interval not displayed.     Estimated Creatinine Clearance: 54.4 mL/min (A) (by C-G formula based on SCr of 1.11 mg/dL (H)).  Assessment: 61 yof with a history of SVT, mitral regurg TR, AI, anxiety. Patient is presenting with tachycardia. Heparin per pharmacy consult placed for atrial fibrillation. Patient is not on anticoagulation prior to arrival.   Heparin level therapeutic  this AM at 0.41; no overt bleeding or complications noted.  CBC stable.  Goal of Therapy:  Heparin level 0.3-0.7 units/ml Monitor platelets by anticoagulation protocol: Yes   Plan:  Per discussion with Dr. Haroldine Laws, will transition patient to Eliquis today. Stop heparin. Start Eliquis 5 mg po BID. Will likely need to initiate paperwork for Eliquis patient assistance prior to discharge since patient is uninsured.  [place] Crane, Roylene Reason, BCCP Clinical Pharmacist  02/18/2022 12:48 PM   [facility] pharmacy phone numbers are listed on [email or web address]

## 2022-02-18 NOTE — Progress Notes (Signed)
OT Cancellation Note  Patient Details Name: Lisa Ayala MRN: [id number] DOB: 1966-04-04   Cancelled Treatment:    Reason Eval/Treat Not Completed: Other (comment).  Working with PT, continue efforts as appropriate.    Kiyara Bouffard D Deondre Marinaro 02/18/2022, 2:42 PM

## 2022-02-18 NOTE — Progress Notes (Addendum)
[place] Progress Note Patient Name: Lisa Ayala DOB: 10/13/1965 MRN: [id number]   Date of Service  02/18/2022  HPI/Events of Note  Patient with agitation, she has a history of psychosis but is not on medications currently. She was also in atrial fibrillation with RVR ( heart rate 145), but she is currently in sinus rhythm, BP 130/76, MAP 91.  eICU Interventions  Precedex gtt ordered. Zyprexa 10 mg IM x 1 ordered.        [place] Caba U Eldrige Pitkin 02/18/2022, 12:12 AM
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
## 2022-02-18 NOTE — Progress Notes (Signed)
Patient ID: Lisa Ayala, female   DOB: 16-Sep-1966, 56 y.o.   MRN: [id number]     Advanced Heart Failure Rounding Note  PCP-Cardiologist: Will Meredith Leeds, MD   Subjective:    - 5/17 Flash pulmonary edema. PEA arrest. Intubated. ACLS 1-2 min. ROSC. Placed on milrinone, norepi, and lasix drip.  - 5/19 LHC/RHC: Nonobstructive CAD. RA 9, PA 50/10, mean PCWP 12, CI 6.8.  Rapid SVT, underwent emergent DCCV.  - 5/20: Recurrent AF with RVR, DCCV attempted x 3 but unable to hold NSR.  - 5/21: Spontaneous conversion to NSR - 5/22: Back in AF with RVR - 05/23: Extubated. Milrinone off. Dilt gtt + amio gtt for AF w/ RVR.  - 5/26 RUQ u/s normal  On Zosyn for PNA. Respiratory culture grew Group C Strep. [place]. WBC trending down.   Agitated overnight. Started on Precedex. Now has sitter. Says she feels ok. Oriented on exam and follows commands. Denies CP, SOB or ab pain.   Remains in NSR on IV amio. Off pressors Co-ox 87% (?)   Off diuretics. Weight continues to trend down.   Echo: 5/18 EF 60-65%, RV mild HK, rheumatic MV, severe MR, Mild AI     Objective:   Weight Range: 62.6 kg Body mass index is 21.62 kg/m.   Vital Signs:   Temp:  [97.8 F (36.6 C)-99.1 F (37.3 C)] 99.1 F (37.3 C) (05/27 0400) Pulse Rate:  [63-134] 67 (05/27 0500) Resp:  [16-32] 16 (05/27 0500) BP: (104-168)/(67-98) 136/76 (05/27 0500) SpO2:  [90 %-99 %] 91 % (05/27 0500) Weight:  [62.6 kg] 62.6 kg (05/27 0500) Last BM Date : 02/17/22  Weight change: Filed Weights   02/16/22 0500 02/17/22 0500 02/18/22 0500  Weight: 63 kg 63.4 kg 62.6 kg    Intake/Output:   Intake/Output Summary (Last 24 hours) at 02/18/2022 0906 Last data filed at 02/18/2022 0600 Gross per 24 hour  Intake 1581.91 ml  Output 1200 ml  Net 381.91 ml       Physical Exam   General:  Lying in bed. Weak appearing. No resp difficulty HEENT: normal Neck: supple. no JVD. Carotids 2+ bilat; no bruits. No lymphadenopathy or  thryomegaly appreciated. Cor: PMI nondisplaced. Regular rate & rhythm. 2/6 MR Lungs: clear Abdomen: soft, nontender, nondistended. No hepatosplenomegaly. No bruits or masses. Good bowel sounds. Extremities: no cyanosis, clubbing, rash, edema Neuro: alert & orientedx3, cranial nerves grossly intact. moves all 4 extremities w/o difficulty. Affect pleasant   Telemetry   NSR 60-70s personally reviewed   Labs    CBC Recent Labs    02/17/22 0527 02/18/22 0320  WBC 22.0* 16.4*  HGB 11.2* 10.8*  HCT 34.9* 33.5*  MCV 88.8 87.0  PLT 409* [id number]    Basic Metabolic Panel Recent Labs    02/17/22 0527 02/18/22 0320  NA 136 137  K 3.8 3.0*  CL 100 102  CO2 26 23  GLUCOSE 107* 112*  BUN 28* 19  CREATININE 1.07* 1.11*  CALCIUM 8.6* 8.7*  MG 2.0 2.0  PHOS 1.7* 2.4*    Liver Function Tests Recent Labs    02/17/22 0527  AST 24  ALT 27  ALKPHOS 111  BILITOT 0.7  PROT 6.5  ALBUMIN 2.2*    No results for input(s): LIPASE, AMYLASE in the last 72 hours. Cardiac Enzymes No results for input(s): CKTOTAL, CKMB, CKMBINDEX, TROPONINI in the last 72 hours.  BNP: BNP (last 3 results) Recent Labs    07/02/21 1335 02/08/22 0351  BNP 1,157.2* 709.9*     ProBNP (last 3 results) No results for input(s): PROBNP in the last 8760 hours.   D-Dimer No results for input(s): DDIMER in the last 72 hours. Hemoglobin A1C No results for input(s): HGBA1C in the last 72 hours. Fasting Lipid Panel Recent Labs    02/17/22 0527  TRIG 109    Thyroid Function Tests No results for input(s): TSH, T4TOTAL, T3FREE, THYROIDAB in the last 72 hours.  Invalid input(s): FREET3  Other results:   Imaging    DG Abd 1 View  Result Date: 02/17/2022 CLINICAL DATA:  Ileus. EXAM: ABDOMEN - 1 VIEW COMPARISON:  Radiograph 01/12/2022 FINDINGS: Feeding 2 with tip in the proximal duodenum. Increasing gaseous distention of the colon. Transverse colon measures 6 cm which is within normal limits. There  is gas in the rectum. IMPRESSION: 1. Feeding tube in first portion duodenum. 2. Gas distended colon without evidence obstruction. Electronically Signed   By: Suzy Bouchard M.D.   On: 02/17/2022 14:25   US Abdomen Limited RUQ (LIVER/GB)  Result Date: 02/17/2022 CLINICAL DATA:  Cholecystitis. EXAM: ULTRASOUND ABDOMEN LIMITED RIGHT UPPER QUADRANT COMPARISON:  None Available. FINDINGS: Gallbladder: No gallstones or wall thickening visualized. No sonographic Murphy sign noted by sonographer. Common bile duct: Diameter: 6 mm. Upper limits of normal. No intrahepatic bile duct dilatation. Liver: No focal lesion identified. Within normal limits in parenchymal echogenicity. Portal vein is patent on color Doppler imaging with normal direction of blood flow towards the liver. Other: None. IMPRESSION: 1. No acute findings. No evidence for cholecystitis. 2. Common bile duct is upper limits of normal measuring 6 mm. Electronically Signed   By: Kerby Moors M.D.   On: 02/17/2022 15:13     Medications:     Scheduled Medications:  chlorhexidine  15 mL Mouth Rinse BID   Chlorhexidine Gluconate Cloth  6 each Topical Daily   docusate  100 mg Per Tube BID   feeding supplement (PROSource TF)  45 mL Per Tube BID   insulin aspart  0-15 Units Subcutaneous Q4H   mouth rinse  15 mL Mouth Rinse q12n4p   melatonin  3 mg Oral Once   pantoprazole (PROTONIX) IV  40 mg Intravenous QHS   polyethylene glycol  17 g Per Tube Daily   potassium chloride  20 mEq Per Tube Q4H   sodium chloride flush  3 mL Intravenous Q12H   spironolactone  12.5 mg Per Tube Daily    Infusions:  amiodarone 60 mg/hr (02/18/22 0832)   dexmedetomidine (PRECEDEX) IV infusion 1.2 mcg/kg/hr (02/18/22 0600)   diltiazem (CARDIZEM) infusion 15 mg/hr (02/18/22 0600)   feeding supplement (VITAL AF 1.2 CAL) 1,000 mL (02/16/22 1645)   heparin 1,200 Units/hr (02/18/22 0600)   piperacillin-tazobactam (ZOSYN)  IV 12.5 mL/hr at 02/18/22 0600   potassium  chloride 10 mEq (02/18/22 0806)    PRN Medications: acetaminophen, bisacodyl, hydrALAZINE, ondansetron (ZOFRAN) IV    Patient Profile   56 y/o woman with severe MR and polysubstance abuse Lost to f/u. Admitted with AFL with RVR -> cardiogenic shock  Assessment/Plan   1. Shock, cardiogenic - in setting of severe MR and flash pulmonary edema. Lactic acid 2.6>1.9  - component of septic shock due to PNA. Respiratory + Group C Strep - CO-OX stable off milrinone. CVP remains low   2. PEA Arrest on 5/17 prior to TEE attempt -ACLS x1 min with ROSC -Now recovered    3.  Acute Hypoxic Respiratory Failure due to flash pulmonary edema/PNA -  Urgent intubation 5/17, extubated 05/23. O2 stable 3L [place] -Green sputum with fever, leukocytosis. PCT 4 => suspect PNA. Zosyn started. Improving.  -Respiratory Cx + Group C Strep.    4.  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  - Seen by TCTS. Plan MVR once recovered from acute illness  - needs rehab in CIR prior to surgery     5 . PAF/SVT - A flutter on admit. Spontaneously converted to SR.  - Repeat DC-CV for rapid SVT 5/19.  - She went into AF with RVR 5/20 early am, rate in the 140s-160s.  DCCV attempted again on 5/20 but failed. - NSR today. Stop diltiazem. Drop amio to 30 - rhythm control difficult w/ severe MR/severe LAE. Will need MAZE at time of MVR.  - Switch heparin to Eliquis  6.  AKI due to ATN/shock - resolved  7. Tobacco/Polysubstance abuse  - tox screen +cocaine/THC  8. Hypokalemia  - K 3.0 today  - continue spiro  - will supp  9. Anxiety - severe   10. ID/PNA - CXR with bibasilar opacities.  - Febrile with PCT 4, WBCs 17.7, green sputum => started Zosyn.  - Respiratory Cx + Group C Strep.  - On zosyn. WBC and PCT both downtrending  11 Hypertension - BP now low. Adjust meds  accordingly .  12. Agitation - oriented on exam. Wean precedex - needs PT/OT  13. RUE swelling - Venous duplex + acute superficial vein thrombosis. No DVT   - supportive care   14. Deconditioning - will need CIR   Length of Stay: [place], MD  02/18/2022, 9:06 AM  Advanced Heart Failure Team Pager [phone] (M-F; 7a - 5p)  Please contact [facility] for night-coverage after hours (5p -7a ) and weekends on [email or web address]

## 2022-02-19 LAB — GLUCOSE, CAPILLARY
Glucose-Capillary: 89 mg/dL (ref 70–99)
Glucose-Capillary: 102 mg/dL — ABNORMAL HIGH (ref 70–99)
Glucose-Capillary: 120 mg/dL — ABNORMAL HIGH (ref 70–99)
Glucose-Capillary: 137 mg/dL — ABNORMAL HIGH (ref 70–99)
Glucose-Capillary: 122 mg/dL — ABNORMAL HIGH (ref 70–99)

## 2022-02-19 LAB — CBC
HCT: 30.1 % — ABNORMAL LOW (ref 36.0–46.0)
Hemoglobin: 9.5 g/dL — ABNORMAL LOW (ref 12.0–15.0)
MCH: 28.2 pg (ref 26.0–34.0)
MCHC: 31.6 g/dL (ref 30.0–36.0)
MCV: 89.3 fL (ref 80.0–100.0)
Platelets: 405 10*3/uL — ABNORMAL HIGH (ref 150–400)
RBC: 3.37 MIL/uL — ABNORMAL LOW (ref 3.87–5.11)
RDW: 19 % — ABNORMAL HIGH (ref 11.5–15.5)
WBC: 14.2 10*3/uL — ABNORMAL HIGH (ref 4.0–10.5)
nRBC: 0.5 % — ABNORMAL HIGH (ref 0.0–0.2)

## 2022-02-19 LAB — BASIC METABOLIC PANEL
Anion gap: 8 (ref 5–15)
BUN: 19 mg/dL (ref 6–20)
CO2: 25 mmol/L (ref 22–32)
Calcium: 8.4 mg/dL — ABNORMAL LOW (ref 8.9–10.3)
Chloride: 108 mmol/L (ref 98–111)
Creatinine, Ser: 1.19 mg/dL — ABNORMAL HIGH (ref 0.44–1.00)
GFR, Estimated: 53 mL/min — ABNORMAL LOW (ref >60)
Glucose, Bld: 102 mg/dL — ABNORMAL HIGH (ref 70–99)
Potassium: 3.2 mmol/L — ABNORMAL LOW (ref 3.5–5.1)
Sodium: 141 mmol/L (ref 135–145)

## 2022-02-19 LAB — COOXEMETRY PANEL
Carboxyhemoglobin: 2.3 % — ABNORMAL HIGH (ref 0.5–1.5)
Methemoglobin: 0.7 % (ref 0.0–1.5)
O2 Saturation: 70.2 %
Total hemoglobin: 9.8 g/dL — ABNORMAL LOW (ref 12.0–16.0)

## 2022-02-19 LAB — MAGNESIUM: Magnesium: 2 mg/dL (ref 1.7–2.4)

## 2022-02-19 LAB — PHOSPHORUS: Phosphorus: 3.5 mg/dL (ref 2.5–4.6)

## 2022-02-19 MED ORDER — POTASSIUM CHLORIDE 20 MEQ PO PACK
40.0000 meq | PACK | ORAL | Status: AC
  Administered 2022-02-19 (×2): 40 meq
  Filled 2022-02-19 (×2): qty 2

## 2022-02-19 MED ORDER — MILRINONE LACTATE IN DEXTROSE 20-5 MG/100ML-% IV SOLN
0.2500 ug/kg/min | INTRAVENOUS | Status: DC
  Administered 2022-02-19: 0.25 ug/kg/min via INTRAVENOUS
  Filled 2022-02-19: qty 100

## 2022-02-19 MED ORDER — AMIODARONE HCL 200 MG PO TABS
200.0000 mg | ORAL_TABLET | Freq: Two times a day (BID) | ORAL | Status: DC
  Administered 2022-02-19 – 2022-02-21 (×5): 200 mg via ORAL
  Filled 2022-02-19 (×5): qty 1

## 2022-02-19 NOTE — Progress Notes (Signed)
Ok to keep cortrak until pt is able to consume enough calories per Dr Gala Romney.

## 2022-02-19 NOTE — Progress Notes (Signed)
Speech Language Pathology Treatment: Dysphagia  Patient Details Name: Lisa Ayala MRN: [id number] DOB: 09/27/1965 Today's Date: 02/19/2022 Time: 1030-1040 SLP Time Calculation (min) (ACUTE ONLY): 10 min  Assessment / Plan / Recommendation Clinical Impression  Pt demonstrates resolution of acute dysphagia. Passed [place], tolerating mastication of solids. Vocal quality clear. Mentation appropriate for self feeding. Pt aware of appropriate posture. Will initiate a regular diet and thin liquids. Will sign off.   HPI HPI: Patient is a 56 y.o. female with PMH: anxiety, SVT, valvular heart disease including severe mitral regurgitation. She presented to the ED on 02/07/22 secondary to 11 day history of persistent tachycardia. Before  the scheduled 5/17 TEE,  patient rapidly declined with progressive dyspnea, flash pulmonary edema, hypoxia requiring intubation; briefly suffered asystolic arrrest during intubation. She was extubated on 02/15/16.      SLP Plan  All goals met      Recommendations for follow up therapy are one component of a multi-disciplinary discharge planning process, led by the attending physician.  Recommendations may be updated based on patient status, additional functional criteria and insurance authorization.    Recommendations  Diet recommendations: Regular;Thin liquid Liquids provided via: Cup;Straw Medication Administration: Whole meds with liquid Supervision: Patient able to self feed                Oral Care Recommendations: Oral care BID Follow Up Recommendations: No SLP follow up Plan: All goals met           ,  Lisa Ayala  02/19/2022, 11:16 AM 

## 2022-02-19 NOTE — Progress Notes (Signed)
NAME:  Lisa Ayala, MRN:  [place]:[id number], DOB:  1966/03/11, LOS: 12 ADMISSION DATE:  02/07/2022, CONSULTATION DATE:  5/17 REFERRING MD:  Dr. Gardiner Rhyme, CHIEF COMPLAINT:  hypoxia   History of Present Illness:  56 year old female with PMH as below, which is significant for severe MR, moderate TR, and SVT. She had an episode of SVT in October of 22 and as part of her cardiology follow up she was noted to have murmur. Echo showed extensive valvular disease including wide open MR. She unfortunately missed her scheduled TEE. Presented to EP clinic 5/16 with complaints of palpitations and dyspnea. She was found to be in a rapid atrial rhythm and was sent to ED for further evaluation. In the ED she was worked up for SVT and had several rounds of adenosine given with some evidence of atrial flutter when rate slowed. She was started on heparin for flutter and was planned for TEE/DCCV. Prior to the case beginning on 5/17 she rapidly declined with progressive dyspnea, flash pulmonary edema, and hypoxia requiring intubation. Briefly suffered asystolic arrest during intubation. PCCM asked to evaluate for vent management.   Pertinent  Medical History   has a past medical history of Aortic insufficiency, PSVT (paroxysmal supraventricular tachycardia) (HCC), Severe mitral regurgitation, and Tricuspid regurgitation.  Significant Hospital Events: Including procedures, antibiotic start and stop dates in addition to other pertinent events   5/16 admit for A flutter 5/17 decompensated in endoscopy prior to TEE. Intubated. Brief asystolic arrest.  0000000 SVT requiring emergent cardioversion 5/24 No major issues overnight, remains in A-fib RVR despite amio and Dilt drips  5/25 No issues overnight 5/26 RUQ pain: imaging, labs neg, improved with BM 5/27 agitation overnight on precedex 5/28 all delirium resolved  Interim History / Subjective:  Completely clear headed this AM Does not remember events of past week  Objective    Blood pressure (!) 103/52, pulse 75, temperature 98.6 F (37 C), temperature source Oral, resp. rate (!) 35, weight 63.2 kg, SpO2 (!) 88 %. CVP:  [7 mmHg] 7 mmHg      Intake/Output Summary (Last 24 hours) at 02/19/2022 0913 Last data filed at 02/19/2022 [id number] Gross per 24 hour  Intake 1249.53 ml  Output --  Net 1249.53 ml    Filed Weights   02/17/22 0500 02/18/22 0500 02/19/22 0600  Weight: 63.4 kg 62.6 kg 63.2 kg    Examination: No distress Oriented Lungs clear +murmur Globally weak Sinus on monitor  Resolved Hospital Problem list     Assessment & Plan:   Acute hypoxemic respiratory failure secondary to flash pulmonary edema and ?HAP- resolved.  Completed zosyn. Severe MR due to rheumatic heart dz resulting in cardiogenic shock and refractory SVT- seems to be settling down Brief PEA arrest 5/17-ACLS provided x1 min with ROSC achieved  Atrial flutter with RVR-CHA2DS2VASc score 1 Substance abuse: THC and cocaine on UDS, ICU delirium- much better today Constipation improved  - Continue bowel regimen as ordered - Fluid status, antiarrythmics, AC and BP control per CHF team - Encourage day/night cycles, continue  qHS seroquel, wean precedex - Will see if we can get her eating and get cortrak out  Stable for transfer to floor, family deciding on whether to pursue CIR vs. Other.  Needs mitral valve replacement at some point if she can get strong enough.  Appreciate [facility] taking over care starting 02/20/22  Best Practice (right click and "Reselect all SmartList Selections" daily)   Diet/type: TF DVT prophylaxis: eliquis GI prophylaxis:  PPI Lines: keep for now Foley:  out Code Status:  full code Last date of multidisciplinary goals of care discussion:  Continue to update patient and family daily  Erskine Emery MD [facility] Personal contact information can be found on Amion  02/19/2022, 9:13 AM

## 2022-02-19 NOTE — Progress Notes (Signed)
Patient ID: Lisa Ayala, female   DOB: 1966/01/17, 56 y.o.   MRN: [id number]     Advanced Heart Failure Rounding Note  PCP-Cardiologist: Will Meredith Leeds, MD   Subjective:    - 5/17 Flash pulmonary edema. PEA arrest. Intubated. ACLS 1-2 min. ROSC. Placed on milrinone, norepi, and lasix drip.  - 5/19 LHC/RHC: Nonobstructive CAD. RA 9, PA 50/10, mean PCWP 12, CI 6.8.  Rapid SVT, underwent emergent DCCV.  - 5/20: Recurrent AF with RVR, DCCV attempted x 3 but unable to hold NSR.  - 5/21: Spontaneous conversion to NSR - 5/22: Back in AF with RVR - 05/23: Extubated. Milrinone off. Dilt gtt + amio gtt for AF w/ RVR.  - 5/26 RUQ u/s normal  On Zosyn for PNA. Respiratory culture grew Group C Strep. [place]. WBC trending down.   Agitation resolved. Alert and oriented. Off precedex. Denies CP or SOB. Co-ox 70%. SBP low 100s   Echo: 5/18 EF 60-65%, RV mild HK, rheumatic MV, severe MR, Mild AI     Objective:   Weight Range: 63.2 kg Body mass index is 21.82 kg/m.   Vital Signs:   Temp:  [97.9 F (36.6 C)-99.4 F (37.4 C)] 98.6 F (37 C) (05/28 0801) Pulse Rate:  [57-150] 75 (05/28 0500) Resp:  [16-36] 35 (05/28 0500) BP: (84-162)/(40-118) 103/52 (05/28 0500) SpO2:  [64 %-98 %] 88 % (05/28 0500) Weight:  [63.2 kg] 63.2 kg (05/28 0600) Last BM Date : 02/17/22  Weight change: Filed Weights   02/17/22 0500 02/18/22 0500 02/19/22 0600  Weight: 63.4 kg 62.6 kg 63.2 kg    Intake/Output:   Intake/Output Summary (Last 24 hours) at 02/19/2022 0938 Last data filed at 02/19/2022 0622 Gross per 24 hour  Intake 1249.53 ml  Output --  Net 1249.53 ml       Physical Exam   General:  Sitting up in bed . No resp difficulty HEENT: normal + cor-trak Neck: supple. no JVD. Carotids 2+ bilat; no bruits. No lymphadenopathy or thryomegaly appreciated. Cor: PMI nondisplaced. Regular rate & rhythm. 2/6 MR Lungs: clear Abdomen: soft, nontender, nondistended. No hepatosplenomegaly. No  bruits or masses. Good bowel sounds. Extremities: no cyanosis, clubbing, rash, edema Neuro: alert & orientedx3, cranial nerves grossly intact. moves all 4 extremities w/o difficulty. Affect pleasant     Telemetry   NSR 70s Personally reviewed   Labs    CBC Recent Labs    02/18/22 0320 02/19/22 0701  WBC 16.4* 14.2*  HGB 10.8* 9.5*  HCT 33.5* 30.1*  MCV 87.0 89.3  PLT 388 405*    Basic Metabolic Panel Recent Labs    02/17/22 0527 02/18/22 0320 02/19/22 0701  NA 136 137 141  K 3.8 3.0* 3.2*  CL 100 102 108  CO2 26 23 25   GLUCOSE 107* 112* 102*  BUN 28* 19 19  CREATININE 1.07* 1.11* 1.19*  CALCIUM 8.6* 8.7* 8.4*  MG 2.0 2.0 2.0  PHOS 1.7* 2.4*  --     Liver Function Tests Recent Labs    02/17/22 0527  AST 24  ALT 27  ALKPHOS 111  BILITOT 0.7  PROT 6.5  ALBUMIN 2.2*     No results for input(s): LIPASE, AMYLASE in the last 72 hours. Cardiac Enzymes No results for input(s): CKTOTAL, CKMB, CKMBINDEX, TROPONINI in the last 72 hours.  BNP: BNP (last 3 results) Recent Labs    07/02/21 1335 02/08/22 0351  BNP 1,157.2* 709.9*     ProBNP (last 3 results) No  results for input(s): PROBNP in the last 8760 hours.   D-Dimer No results for input(s): DDIMER in the last 72 hours. Hemoglobin A1C No results for input(s): HGBA1C in the last 72 hours. Fasting Lipid Panel Recent Labs    02/17/22 0527  TRIG 109    Thyroid Function Tests No results for input(s): TSH, T4TOTAL, T3FREE, THYROIDAB in the last 72 hours.  Invalid input(s): FREET3  Other results:   Imaging    No results found.   Medications:     Scheduled Medications:  apixaban  5 mg Oral BID   chlorhexidine  15 mL Mouth Rinse BID   Chlorhexidine Gluconate Cloth  6 each Topical Daily   docusate  100 mg Per Tube BID   feeding supplement (PROSource TF)  45 mL Per Tube BID   insulin aspart  0-15 Units Subcutaneous Q4H   mouth rinse  15 mL Mouth Rinse q12n4p   melatonin  3 mg  Oral Once   pantoprazole (PROTONIX) IV  40 mg Intravenous QHS   polyethylene glycol  17 g Per Tube Daily   potassium chloride  40 mEq Per Tube Q4H   QUEtiapine  50 mg Oral QHS   sodium chloride flush  3 mL Intravenous Q12H   spironolactone  12.5 mg Per Tube Daily    Infusions:  amiodarone 30 mg/hr (02/19/22 0622)   dexmedetomidine (PRECEDEX) IV infusion Stopped (02/18/22 1029)   feeding supplement (VITAL AF 1.2 CAL) 1,000 mL (02/19/22 0100)    PRN Medications: acetaminophen, bisacodyl, hydrALAZINE, ondansetron (ZOFRAN) IV    Patient Profile   56 y/o woman with severe MR and polysubstance abuse Lost to f/u. Admitted with AFL with RVR -> cardiogenic shock  Assessment/Plan   1. Shock, cardiogeni - in setting of severe MR and flash pulmonary edema. Lactic acid 2.6>1.9  - component of septic shock due to PNA. Respiratory + Group C Strep - CO-OX stable off milrinone. Volume status ok   2. PEA Arrest on 5/17 prior to TEE attempt -ACLS x1 min with ROSC -Now recovered    3.  Acute Hypoxic Respiratory Failure due to flash pulmonary edema/PNA -Urgent intubation 5/17, extubated 05/23. O2 stable 3L [place] -Green sputum with fever, leukocytosis. PCT 4 => suspect PNA. Zosyn started. Improving.  -Respiratory Cx + Group C Strep.  - Much improved   4.  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  - Seen by TCTS. Plan MVR once recovered from acute illness  - needs rehab in CIR prior to surgery but family still unsure if they want to pursue. HAve encouraged them to pursue CIR as only real way to rehab sufficiently for MVR   5 . PAF/SVT - A flutter on admit. Spontaneously converted to SR.  - Repeat DC-CV for rapid SVT 5/19.  - She went into AF with RVR 5/20 early am, rate in the 140s-160s.  DCCV attempted again on 5/20 but failed. - In NSR today. Off  diltiazem. Switch to po amio - Planning MAZE at time of MVR.   6.  AKI due to ATN/shock - resolved  7. Tobacco/Polysubstance abuse  - tox screen +cocaine/THC  8. Hypokalemia  - K 3.2 today  - continue spiro  - will supp  9. Anxiety - severe   10. ID/PNA - Respiratory Cx + Group C Strep. On zosyn  11 Hypertension - BP now low. Adjust meds accordingly .  12. Agitation - resolved  13.  Deconditioning - will need CIR (see discussion above)  Looks much better. Agree with plan to send to floor. Advance diet.  Will need CIR.   Length of Stay: Geronimo, MD  02/19/2022, 9:38 AM  Advanced Heart Failure Team Pager [phone] (M-F; 7a - 5p)  Please contact [facility] for night-coverage after hours (5p -7a ) and weekends on [email or web address]

## 2022-02-20 LAB — CBC
HCT: 32.6 % — ABNORMAL LOW (ref 36.0–46.0)
Hemoglobin: 10.4 g/dL — ABNORMAL LOW (ref 12.0–15.0)
MCH: 28.6 pg (ref 26.0–34.0)
MCHC: 31.9 g/dL (ref 30.0–36.0)
MCV: 89.6 fL (ref 80.0–100.0)
Platelets: 434 10*3/uL — ABNORMAL HIGH (ref 150–400)
RBC: 3.64 MIL/uL — ABNORMAL LOW (ref 3.87–5.11)
RDW: 19.2 % — ABNORMAL HIGH (ref 11.5–15.5)
WBC: 17.5 10*3/uL — ABNORMAL HIGH (ref 4.0–10.5)
nRBC: 0.6 % — ABNORMAL HIGH (ref 0.0–0.2)

## 2022-02-20 LAB — BASIC METABOLIC PANEL
Anion gap: 10 (ref 5–15)
BUN: 15 mg/dL (ref 6–20)
CO2: 20 mmol/L — ABNORMAL LOW (ref 22–32)
Calcium: 8.5 mg/dL — ABNORMAL LOW (ref 8.9–10.3)
Chloride: 112 mmol/L — ABNORMAL HIGH (ref 98–111)
Creatinine, Ser: 1.14 mg/dL — ABNORMAL HIGH (ref 0.44–1.00)
GFR, Estimated: 56 mL/min — ABNORMAL LOW (ref >60)
Glucose, Bld: 132 mg/dL — ABNORMAL HIGH (ref 70–99)
Potassium: 3.4 mmol/L — ABNORMAL LOW (ref 3.5–5.1)
Sodium: 142 mmol/L (ref 135–145)

## 2022-02-20 LAB — GLUCOSE, CAPILLARY
Glucose-Capillary: 124 mg/dL — ABNORMAL HIGH (ref 70–99)
Glucose-Capillary: 124 mg/dL — ABNORMAL HIGH (ref 70–99)
Glucose-Capillary: 140 mg/dL — ABNORMAL HIGH (ref 70–99)
Glucose-Capillary: 150 mg/dL — ABNORMAL HIGH (ref 70–99)
Glucose-Capillary: 90 mg/dL (ref 70–99)
Glucose-Capillary: 99 mg/dL (ref 70–99)

## 2022-02-20 LAB — TRIGLYCERIDES: Triglycerides: 64 mg/dL (ref <150)

## 2022-02-20 LAB — MAGNESIUM: Magnesium: 1.9 mg/dL (ref 1.7–2.4)

## 2022-02-20 MED ORDER — AMIODARONE IV BOLUS ONLY 150 MG/100ML
150.0000 mg | Freq: Once | INTRAVENOUS | Status: AC
  Administered 2022-02-20: 150 mg via INTRAVENOUS
  Filled 2022-02-20: qty 100

## 2022-02-20 MED ORDER — PANTOPRAZOLE SODIUM 40 MG PO TBEC
40.0000 mg | DELAYED_RELEASE_TABLET | Freq: Every day | ORAL | Status: DC
  Administered 2022-02-20: 40 mg via ORAL
  Filled 2022-02-20: qty 1

## 2022-02-20 MED ORDER — POTASSIUM CHLORIDE 20 MEQ PO PACK
40.0000 meq | PACK | Freq: Once | ORAL | Status: AC
  Administered 2022-02-20: 40 meq via ORAL
  Filled 2022-02-20: qty 2

## 2022-02-20 MED ORDER — DOCUSATE SODIUM 50 MG/5ML PO LIQD
100.0000 mg | Freq: Two times a day (BID) | ORAL | Status: DC
  Filled 2022-02-20 (×3): qty 10

## 2022-02-20 MED ORDER — AMIODARONE HCL IN DEXTROSE 360-4.14 MG/200ML-% IV SOLN
30.0000 mg/h | INTRAVENOUS | Status: DC
  Administered 2022-02-20: 30 mg/h via INTRAVENOUS

## 2022-02-20 MED ORDER — AMIODARONE HCL IN DEXTROSE 360-4.14 MG/200ML-% IV SOLN
30.0000 mg/h | INTRAVENOUS | Status: AC
Start: 2022-02-20 — End: 2022-02-20
  Administered 2022-02-20: 30 mg/h via INTRAVENOUS
  Filled 2022-02-20: qty 200

## 2022-02-20 MED ORDER — SPIRONOLACTONE 12.5 MG HALF TABLET: 12.5000 mg | ORAL_TABLET | Freq: Every day | ORAL | Status: DC

## 2022-02-20 MED ORDER — MAGNESIUM SULFATE 2 GM/50ML IV SOLN
2.0000 g | Freq: Once | INTRAVENOUS | Status: AC
  Administered 2022-02-20: 2 g via INTRAVENOUS
  Filled 2022-02-20: qty 50

## 2022-02-20 NOTE — Progress Notes (Signed)
Pt heart rate continues to be 140-160s.  Pt is asymptomatic.  Vin Bhagat, PA notified with no new orders at present.  Will continue to monitor.

## 2022-02-20 NOTE — Progress Notes (Addendum)
Pt converted to afib rvr 120-150s.  BP 110/86, sat 94% on room air.  Pt denies any c/o cp, palpitations, dyspnea or other symptoms.  Dr. Welton Flakes notified via [facility] and obtaining EKG.  MD reviewed EKG and new orders obtained.  IV amio bolus given with no change.  Notified MD and new orders for IV amio at 30 mg/hr.

## 2022-02-20 NOTE — Progress Notes (Signed)
Pt auto-converted to NSR. EKG strip done. Dr. Lucianne Muss notified.

## 2022-02-20 NOTE — Progress Notes (Addendum)
PROGRESS NOTE    Lisa Ayala  [id number] DOB: 02/02/1966 DOA: 02/07/2022  PCP: Pcp, No    Brief Narrative:  This 56 years old female with PMH significant for aortic insufficiency, PSVT, severe mitral regurgitation, tricuspid regurgitation presented in the EP clinic with complaints of palpitations and shortness of breath.  She was found to be in rapid atrial rhythm and was sent to the ED for further evaluation.  She was found to have SVT and was given several rounds of adenosine given with some evidence of atrial flutter when rate slowed.  She was also started on heparin for flutter and is scheduled to have TEE/DCCV.  Prior to that patient declined with progressive dyspnea , flash pulmonary edema and hypoxia requiring intubation.  She also suffered asystole,  arrest during intubation.  Patient is doing better now.  PCCM pickup 5/29.  Significant Hospital events 5/16 admit for A flutter 5/17 decompensated in endoscopy prior to TEE. Intubated. Brief asystolic arrest.  0000000 SVT requiring emergent cardioversion 5/24 No major issues overnight, remains in A-fib RVR despite amio and Dilt drips  5/25 No issues overnight 5/26 RUQ pain: imaging, labs neg, improved with BM 5/27 agitation overnight on precedex 5/28 all delirium resolved  Assessment & Plan:   Principal Problem:   Atrial flutter ([place]) Active Problems:   Rheumatic mitral regurgitation   Cardiogenic shock (HCC)   Acute respiratory failure with hypoxia (HCC)   Malnutrition of moderate degree  Acute hypoxic respiratory failure secondary to flash pulmonary edema. Could be multifactorial. She has completed antibiotics for HCAP. Patient is doing much better.  Cardiogenic shock and refractory SVT: Patient has severe MR due to rheumatic heart disease. Patient was in cardiogenic shock requiring Levophed support. She is off pressors.  Heart rate is settling down. CHF team is following.  Brief PEA/ cardiac arrest: ACLS protocol  provided for 1 minute with ROSC achieved.  Atrial flutter with RVR: Heart rate is now controlled. She was on amiodarone gtt. now transition to p.o. amiodarone. Continue Eliquis for anticoagulation.  Substance abuse: Urine drug screen positive for THC and cocaine Much improved.  Emphasized cessation.  Constipation: Continue bowel regimen.  Delirium: Continue Seroquel, frequent reorientation.   DVT prophylaxis:Eliquis Code Status: Full code. Family Communication:  No family at bed side. Disposition Plan:   Status is: Inpatient Remains inpatient appropriate because: Admitted for palpitations and bradycardia,  found to have SVT,  became hypoxic intubated and subsequently extubated.    Consultants:  PCCM/cardiology  Procedures: Intubated /extubated  Antimicrobials:  Anti-infectives (From admission, onward)    Start     Dose/Rate Route Frequency Ordered Stop   02/11/22 1400  piperacillin-tazobactam (ZOSYN) IVPB 3.375 g        3.375 g 12.5 mL/hr over 240 Minutes Intravenous Every 8 hours 02/11/22 1019 02/18/22 0916        Subjective: Patient was seen and examined at bedside.  Overnight events noted.   Patient reports feeling much improved.  She has a cortek tube that will be removed today.   She is able to take orally.  Objective: Vitals:   02/20/22 0437 02/20/22 0440 02/20/22 0725 02/20/22 0803  BP: 117/88 117/88 (!) 149/94   Pulse: (!) 133 (!) 142 (!) 146 84  Resp: 20  19   Temp: 98.4 F (36.9 C)  98.1 F (36.7 C)   TempSrc: Oral  Oral   SpO2: 95% 95% 97% 99%  Weight: 62.9 kg       Intake/Output Summary (Last  24 hours) at 02/20/2022 1416 Last data filed at 02/20/2022 0900 Gross per 24 hour  Intake 851.13 ml  Output 800 ml  Net 51.13 ml   Filed Weights   02/18/22 0500 02/19/22 0600 02/20/22 0437  Weight: 62.6 kg 63.2 kg 62.9 kg    Examination:  General exam: Appears comfortable, not in any acute distress.  Deconditioned Respiratory system: CTA  bilaterally, no wheezing, no crackles, normal respiratory effort. Cardiovascular system: S1 & S2 heard, regular rhythm, murmur+. Gastrointestinal system: Abdomen is soft, non tender, non distended, BS+ Central nervous system: Alert and oriented x 3. No focal neurological deficits. Extremities: No edema, no cyanosis, no clubbing. Skin: No rashes, lesions or ulcers Psychiatry: Judgement and insight appear normal. Mood & affect appropriate.     Data Reviewed: I have personally reviewed following labs and imaging studies  CBC: Recent Labs  Lab 02/16/22 0354 02/17/22 0527 02/18/22 0320 02/19/22 0701 02/20/22 0518  WBC 28.5* 22.0* 16.4* 14.2* 17.5*  HGB 12.6 11.2* 10.8* 9.5* 10.4*  HCT 38.4 34.9* 33.5* 30.1* 32.6*  MCV 87.7 88.8 87.0 89.3 89.6  PLT 400 409* 388 405* [id number]*   Basic Metabolic Panel: Recent Labs  Lab 02/16/22 0556 02/16/22 1557 02/16/22 2001 02/17/22 0527 02/18/22 0320 02/19/22 0701 02/19/22 0711 02/20/22 0518  NA  --   --  136 136 137 141  --  142  K  --   --  3.4* 3.8 3.0* 3.2*  --  3.4*  CL  --   --  101 100 102 108  --  112*  CO2  --   --  26 26 23 25   --  20*  GLUCOSE  --   --  124* 107* 112* 102*  --  132*  BUN  --   --  26* 28* 19 19  --  15  CREATININE  --   --  1.06* 1.07* 1.11* 1.19*  --  1.14*  CALCIUM  --   --  8.5* 8.6* 8.7* 8.4*  --  8.5*  MG  --  2.1  --  2.0 2.0 2.0  --  1.9  PHOS 2.2* 1.7*  --  1.7* 2.4*  --  3.5  --    GFR: Estimated Creatinine Clearance: 52.9 mL/min (A) (by C-G formula based on SCr of 1.14 mg/dL (H)). Liver Function Tests: Recent Labs  Lab 02/17/22 0527  AST 24  ALT 27  ALKPHOS 111  BILITOT 0.7  PROT 6.5  ALBUMIN 2.2*   No results for input(s): LIPASE, AMYLASE in the last 168 hours. Recent Labs  Lab 02/15/22 1214  AMMONIA 14   Coagulation Profile: No results for input(s): INR, PROTIME in the last 168 hours. Cardiac Enzymes: No results for input(s): CKTOTAL, CKMB, CKMBINDEX, TROPONINI in the last 168  hours. BNP (last 3 results) No results for input(s): PROBNP in the last 8760 hours. HbA1C: No results for input(s): HGBA1C in the last 72 hours. CBG: Recent Labs  Lab 02/19/22 2037 02/20/22 0015 02/20/22 0435 02/20/22 0724 02/20/22 1133  GLUCAP 122* 124* 124* 140* 90   Lipid Profile: Recent Labs    02/20/22 0518  TRIG 64   Thyroid Function Tests: No results for input(s): TSH, T4TOTAL, FREET4, T3FREE, THYROIDAB in the last 72 hours. Anemia Panel: No results for input(s): VITAMINB12, FOLATE, FERRITIN, TIBC, IRON, RETICCTPCT in the last 72 hours. Sepsis Labs: Recent Labs  Lab 02/14/22 0809 02/15/22 0336 02/16/22 0530  PROCALCITON 4.11 4.97 3.64    Recent Results (from the  past 240 hour(s))  Culture, blood (Routine X 2) w Reflex to ID Panel     Status: None   Collection Time: 02/11/22  9:49 AM   Specimen: BLOOD RIGHT HAND  Result Value Ref Range Status   Specimen Description BLOOD RIGHT HAND  Final   Special Requests   Final    BOTTLES DRAWN AEROBIC AND ANAEROBIC Blood Culture adequate volume   Culture   Final    NO GROWTH 5 DAYS Performed at [facility], [address], [place], [place] [address]    Report Status 02/16/2022 FINAL  Final  Culture, blood (Routine X 2) w Reflex to ID Panel     Status: None   Collection Time: 02/11/22  9:57 AM   Specimen: BLOOD LEFT WRIST  Result Value Ref Range Status   Specimen Description BLOOD LEFT WRIST  Final   Special Requests   Final    BOTTLES DRAWN AEROBIC AND ANAEROBIC Blood Culture adequate volume   Culture   Final    NO GROWTH 5 DAYS Performed at [facility], [place] [address], [place], [place] [address]    Report Status 02/16/2022 FINAL  Final  Culture, Respiratory w Gram Stain     Status: None   Collection Time: 02/11/22 10:06 AM   Specimen: Tracheal Aspirate; Respiratory  Result Value Ref Range Status   Specimen Description TRACHEAL ASPIRATE  Final   Special Requests NONE  Final   Gram Stain   Final     MODERATE WBC PRESENT, PREDOMINANTLY PMN ABUNDANT GRAM POSITIVE COCCI IN PAIRS FEW GRAM NEGATIVE RODS RARE GRAM POSITIVE RODS    Culture   Final    MODERATE STREPTOCOCCUS GROUP C Beta hemolytic streptococci are predictably susceptible to penicillin and other beta lactams. Susceptibility testing not routinely performed. Performed at [facility], [address] [place], [place] [address]    Report Status 02/13/2022 FINAL  Final    Radiology Studies: No results found.  Scheduled Meds:  amiodarone  200 mg Oral BID   apixaban  5 mg Oral BID   chlorhexidine  15 mL Mouth Rinse BID   docusate  100 mg Per Tube BID   feeding supplement (PROSource TF)  45 mL Per Tube BID   insulin aspart  0-15 Units Subcutaneous Q4H   mouth rinse  15 mL Mouth Rinse q12n4p   melatonin  3 mg Oral Once   pantoprazole (PROTONIX) IV  40 mg Intravenous QHS   polyethylene glycol  17 g Per Tube Daily   QUEtiapine  50 mg Oral QHS   sodium chloride flush  3 mL Intravenous Q12H   spironolactone  12.5 mg Per Tube Daily   Continuous Infusions:  amiodarone 30 mg/hr (02/20/22 0829)   dexmedetomidine (PRECEDEX) IV infusion Stopped (02/18/22 1029)   feeding supplement (VITAL AF 1.2 CAL) 1,000 mL (02/19/22 0100)     LOS: 13 days    Time spent: 50 mins    Bridgitte Felicetti, MD [facility]   If 7PM-7AM, please contact night-coverage

## 2022-02-20 NOTE — Progress Notes (Signed)
Occupational Therapy Treatment Patient Details Name: Lisa Ayala MRN: [id number] DOB: 1966/04/09 Today's Date: 02/20/2022   History of present illness Pt is a 56 y.o. female admitted 02/08/22 with aflutter. Plan for TEE/DCCV, pt rapidly declined 5/17 with progressive dyspnea, flash pulmonary edema, and hypoxia requiring intubation; pt briefly suffered asystolic arrest during intubation. Pt with SVT 5/19 requiring emergent cardioversion; recurrent AF with RVR on 5/20 with DCCV attempted x3; spontaneous return to NSR 5/21. 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 ETT 5/17-5/23. AMS noted 5/24; head CT negative for acute abnormality, suspicion for subacute/chronic infarct L cerebellum. Agitation overnight 5/26 requiring precedex. PMH includes severe MR, moderate TR, SVT.   OT comments  Patient up in recliner upon arrival. Patient in good spirits and eager to participate. Patient able to stand and ambulate to sink and perform grooming tasks without a seated break before returning to recliner. Patient provided level one therapy band and instructed in BUE exercises for HEP. Patient demonstrated good understanding of HEP. Patient is making good gains with OT treatment and mobility. Acute OT to continue to follow.    Recommendations for follow up therapy are one component of a multi-disciplinary discharge planning process, led by the attending physician.  Recommendations may be updated based on patient status, additional functional criteria and insurance authorization.    Follow Up Recommendations  Acute inpatient rehab (3hours/day)    Assistance Recommended at Discharge Frequent or constant Supervision/Assistance  Patient can return home with the following  A lot of help with bathing/dressing/bathroom;Assistance with cooking/housework;Assistance with feeding;Direct supervision/assist for medications management;Direct supervision/assist for financial  management;Assist for transportation;Help with stairs or ramp for entrance;A little help with walking and/or transfers   Equipment Recommendations  BSC/3in1;Other (comment) (defer to next venue)    Recommendations for Other Services      Precautions / Restrictions Precautions Precautions: Fall;Other (comment) Precaution Comments: urinary urgency Restrictions Weight Bearing Restrictions: No       Mobility Bed Mobility               General bed mobility comments: seated in recliner    Transfers Overall transfer level: Needs assistance Equipment used: Rolling walker (2 wheels) Transfers: Sit to/from Stand Sit to Stand: Min guard           General transfer comment: stood from recliner with min guard assist     Balance Overall balance assessment: Needs assistance Sitting-balance support: No upper extremity supported, Feet supported Sitting balance-Leahy Scale: Fair Sitting balance - Comments: seated in recliner   Standing balance support: No upper extremity supported, Single extremity supported, Bilateral upper extremity supported, During functional activity Standing balance-Leahy Scale: Fair Standing balance comment: stood at sink for grooming tasks without UE support                           ADL either performed or assessed with clinical judgement   ADL Overall ADL's : Needs assistance/impaired     Grooming: Wash/dry hands;Wash/dry face;Min guard;Standing Grooming Details (indicate cue type and reason): standing at sink                               General ADL Comments: performed grooming standing at sink without seated rest break    Extremity/Trunk Assessment Upper Extremity Assessment RUE Deficits / Details: able to achieve full B shoulder flex/ext, noted poor eccentric control  with lowering; apparent dysmetria with RUE, grasp grossly 3+/5 RUE Sensation: WNL;decreased proprioception RUE Coordination: decreased fine  motor;decreased gross motor LUE Deficits / Details: able to achieve full B shoulder flex/ext, noted poor eccentric control with lowering, grasp weak grossly            Vision       Perception     Praxis      Cognition Arousal/Alertness: Awake/alert Behavior During Therapy: Flat affect Overall Cognitive Status: Impaired/Different from baseline Area of Impairment: Orientation, Attention, Following commands, Safety/judgement, Awareness, Problem solving                 Orientation Level: Disoriented to, Time, Situation Current Attention Level: Selective   Following Commands: Follows one step commands consistently, Follows one step commands with increased time Safety/Judgement: Decreased awareness of safety, Decreased awareness of deficits Awareness: Intellectual Problem Solving: Slow processing, Difficulty sequencing, Requires verbal cues General Comments: required frequent cues for safety and with lines        Exercises Exercises: General Upper Extremity General Exercises - Upper Extremity Shoulder Flexion: Strengthening, Both, 10 reps, Seated, Theraband Theraband Level (Shoulder Flexion): Level 1 (Yellow) Shoulder ABduction: Strengthening, Both, 10 reps, Seated, Theraband Theraband Level (Shoulder Abduction): Level 1 (Yellow) Elbow Flexion: Strengthening, Both, 10 reps, Seated, Theraband Theraband Level (Elbow Flexion): Level 1 (Yellow) Elbow Extension: Strengthening, Both, 10 reps, Seated, Theraband Theraband Level (Elbow Extension): Level 1 (Yellow)    Shoulder Instructions       General Comments      Pertinent Vitals/ Pain       Pain Assessment Pain Assessment: Faces Faces Pain Scale: Hurts a little bit Pain Location: abdomen Pain Descriptors / Indicators: Discomfort Pain Intervention(s): Monitored during session  Home Living                                          Prior Functioning/Environment              Frequency  Min  2X/week        Progress Toward Goals  OT Goals(current goals can now be found in the care plan section)  Progress towards OT goals: Progressing toward goals  Acute Rehab OT Goals Patient Stated Goal: get better OT Goal Formulation: With patient Time For Goal Achievement: 03/02/22 Potential to Achieve Goals: Good ADL Goals Pt Will Perform Grooming: sitting;with set-up Pt Will Perform Upper Body Dressing: with supervision;sitting Pt Will Perform Lower Body Dressing: with supervision;sit to/from stand Pt Will Transfer to Toilet: with supervision;ambulating Pt Will Perform Toileting - Clothing Manipulation and hygiene: with min guard assist;sit to/from stand Additional ADL Goal #1: Pt will follow multistep commands with 75% accuracy  Plan Discharge plan remains appropriate    Co-evaluation                 AM-PAC OT "6 Clicks" Daily Activity     Outcome Measure   Help from another person eating meals?: None Help from another person taking care of personal grooming?: A Little Help from another person toileting, which includes using toliet, bedpan, or urinal?: A Lot Help from another person bathing (including washing, rinsing, drying)?: A Lot Help from another person to put on and taking off regular upper body clothing?: A Lot Help from another person to put on and taking off regular lower body clothing?: A Lot 6 Click Score: 15    End of  Session Equipment Utilized During Treatment: Rolling walker (2 wheels)  OT Visit Diagnosis: Unsteadiness on feet (R26.81);Other abnormalities of gait and mobility (R26.89);Muscle weakness (generalized) (M62.81);Other symptoms and signs involving cognitive function   Activity Tolerance Patient tolerated treatment well   Patient Left in chair;with call bell/phone within reach   Nurse Communication Mobility status        Time: [id number] OT Time Calculation (min): 28 min  Charges: OT General Charges $OT Visit: 1 Visit OT  Treatments $Self Care/Home Management : 8-22 mins $Therapeutic Exercise: 8-22 mins  Lodema Hong, OTA Acute Rehabilitation Services  Pager [phone] Office [place] 02/20/2022, 3:14 PM

## 2022-02-20 NOTE — Progress Notes (Signed)
Inpatient Rehab Admissions Coordinator:   I met with  Pt. And reviewed cost of care with her. She states that she is not sure if she wants to pursue CIR or if she would rather go home considering she is ambulating better now. She is to discuss with daughter and get back to me tomorrow morning.   Clemens Catholic, [place], [place] Admissions Coordinator  [phone] ([place]) [phone] (office)

## 2022-02-20 NOTE — Progress Notes (Signed)
Inpatient Rehab Admissions Coordinator:    I do not have a CIR bed for this pt. Today. I continue to await medical readiness and for Pt/daughter to agree to cost of care.   Megan Salon, MS, CCC-SLP Rehab Admissions Coordinator  [phone] (celll) [phone] (office)

## 2022-02-20 NOTE — Progress Notes (Signed)
Patient ID: Lisa Ayala, female   DOB: 28-Jun-1966, 56 y.o.   MRN: [place]:[id number]     Advanced Heart Failure Rounding Note  PCP-Cardiologist: Will Meredith Leeds, MD   Subjective:    - 5/17 Flash pulmonary edema. PEA arrest. Intubated. ACLS 1-2 min. ROSC. Placed on milrinone, norepi, and lasix drip.  - 5/19 LHC/RHC: Nonobstructive CAD. RA 9, PA 50/10, mean PCWP 12, CI 6.8.  Rapid SVT, underwent emergent DCCV.  - 5/20: Recurrent AF with RVR, DCCV attempted x 3 but unable to hold NSR.  - 5/21: Spontaneous conversion to NSR - 5/22: Back in AF with RVR - 05/23: Extubated. Milrinone off. Dilt gtt + amio gtt for AF w/ RVR.  - 5/26 RUQ u/s normal  Transferred to floor yesterday. Had AF with RVR x 2 hours. Now back in NSR.   Feels ok. No CP or SOB.   Echo: 5/18 EF 60-65%, RV mild HK, rheumatic MV, severe MR, Mild AI     Objective:   Weight Range: 62.9 kg Body mass index is 21.72 kg/m.   Vital Signs:   Temp:  [97.9 F (36.6 C)-98.6 F (37 C)] 98.1 F (36.7 C) (05/29 0725) Pulse Rate:  [51-146] 84 (05/29 0803) Resp:  [16-28] 19 (05/29 0725) BP: (110-188)/(65-98) 149/94 (05/29 0725) SpO2:  [87 %-100 %] 99 % (05/29 0803) Weight:  [62.9 kg] 62.9 kg (05/29 0437) Last BM Date : 02/20/22  Weight change: Filed Weights   02/18/22 0500 02/19/22 0600 02/20/22 0437  Weight: 62.6 kg 63.2 kg 62.9 kg    Intake/Output:   Intake/Output Summary (Last 24 hours) at 02/20/2022 0931 Last data filed at 02/20/2022 0900 Gross per 24 hour  Intake 1003.57 ml  Output 800 ml  Net 203.57 ml       Physical Exam   General:  Sitting up No resp difficulty HEENT: normal + Cor-trak Neck: supple. no JVD. Carotids 2+ bilat; no bruits. No lymphadenopathy or thryomegaly appreciated. Cor: PMI nondisplaced. Regular rate & rhythm. 2/6 MR Lungs: clear Abdomen: soft, nontender, nondistended. No hepatosplenomegaly. No bruits or masses. Good bowel sounds. Extremities: no cyanosis, clubbing, rash, edema Neuro:  alert & orientedx3, cranial nerves grossly intact. moves all 4 extremities w/o difficulty. Affect pleasant   Telemetry   NSR 80s Personally reviewed   Labs    CBC Recent Labs    02/19/22 0701 02/20/22 0518  WBC 14.2* 17.5*  HGB 9.5* 10.4*  HCT 30.1* 32.6*  MCV 89.3 89.6  PLT 405* 434*    Basic Metabolic Panel Recent Labs    02/18/22 0320 02/19/22 0701 02/19/22 0711 02/20/22 0518  NA 137 141  --  142  K 3.0* 3.2*  --  3.4*  CL 102 108  --  112*  CO2 23 25  --  20*  GLUCOSE 112* 102*  --  132*  BUN 19 19  --  15  CREATININE 1.11* 1.19*  --  1.14*  CALCIUM 8.7* 8.4*  --  8.5*  MG 2.0 2.0  --  1.9  PHOS 2.4*  --  3.5  --     Liver Function Tests No results for input(s): AST, ALT, ALKPHOS, BILITOT, PROT, ALBUMIN in the last 72 hours.   No results for input(s): LIPASE, AMYLASE in the last 72 hours. Cardiac Enzymes No results for input(s): CKTOTAL, CKMB, CKMBINDEX, TROPONINI in the last 72 hours.  BNP: BNP (last 3 results) Recent Labs    07/02/21 1335 02/08/22 0351  BNP 1,157.2* 709.9*  ProBNP (last 3 results) No results for input(s): PROBNP in the last 8760 hours.   D-Dimer No results for input(s): DDIMER in the last 72 hours. Hemoglobin A1C No results for input(s): HGBA1C in the last 72 hours. Fasting Lipid Panel Recent Labs    02/20/22 0518  TRIG 64    Thyroid Function Tests No results for input(s): TSH, T4TOTAL, T3FREE, THYROIDAB in the last 72 hours.  Invalid input(s): FREET3  Other results:   Imaging    No results found.   Medications:     Scheduled Medications:  amiodarone  200 mg Oral BID   apixaban  5 mg Oral BID   chlorhexidine  15 mL Mouth Rinse BID   docusate  100 mg Per Tube BID   feeding supplement (PROSource TF)  45 mL Per Tube BID   insulin aspart  0-15 Units Subcutaneous Q4H   mouth rinse  15 mL Mouth Rinse q12n4p   melatonin  3 mg Oral Once   pantoprazole (PROTONIX) IV  40 mg Intravenous QHS    polyethylene glycol  17 g Per Tube Daily   QUEtiapine  50 mg Oral QHS   sodium chloride flush  3 mL Intravenous Q12H   spironolactone  12.5 mg Per Tube Daily    Infusions:  amiodarone 30 mg/hr (02/20/22 0345)   amiodarone 30 mg/hr (02/20/22 0829)   dexmedetomidine (PRECEDEX) IV infusion Stopped (02/18/22 1029)   feeding supplement (VITAL AF 1.2 CAL) 1,000 mL (02/19/22 0100)    PRN Medications: acetaminophen, bisacodyl, hydrALAZINE, ondansetron (ZOFRAN) IV    Patient Profile   56 y/o woman with severe MR and polysubstance abuse Lost to f/u. Admitted with AFL with RVR -> cardiogenic shock  Assessment/Plan   1. Shock, cardiogenic - in setting of severe MR and flash pulmonary edema. Lactic acid 2.6>1.9  - component of septic shock due to PNA. Respiratory + Group C Strep - Resolved  2. PEA Arrest on 5/17 prior to TEE attempt -ACLS x1 min with ROSC -Now recovered    3.  Acute Hypoxic Respiratory Failure due to flash pulmonary edema/PNA -Urgent intubation 5/17, extubated 05/23. O2 stable 3L [place] -Green sputum with fever, leukocytosis. PCT 4 => Completed Zosyn   -Respiratory Cx + Group C Strep.  - Much improved   4.  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  - Seen by TCTS. Plan MVR once recovered from acute illness  - needs rehab in CIR prior to surgery but family still unsure if they want to pursue. Have encouraged them to pursue CIR as only real way to rehab sufficiently for MVR. They are in agreement    5 . PAF/SVT - A flutter on admit. Spontaneously converted to SR.  - Multiple episodes of AF during hospitalization. - Had recurrent AF this am x 2 hours. Now back in NSR. Continue po amio/Eliquis - Planning MAZE at time of MVR.   6.  AKI due to ATN/shock - resolved  7. Tobacco/Polysubstance abuse  - tox screen +cocaine/THC  8.  Hypokalemia  - K 3.4 today  - continue spiro  - will supp  9. Anxiety - severe   10. ID/PNA - Respiratory Cx + Group C Strep. Completed  zosyn 5/20->5/27 - WBC trending back up. Encourage pulmonary toilet   11 Hypertension - BP now low. Meds adjusted  12.  Deconditioning - will need CIR (see discussion above)  Looks much better. Continue amio for PAF. WBC trending back up. Encourage pulmonary toilet. Needs CIR. Advance diet as tolerated   Length of Stay: 13  Glori Bickers, MD  02/20/2022, 9:31 AM  Advanced Heart Failure Team Pager [phone] (M-F; 7a - 5p)  Please contact [facility] for night-coverage after hours (5p -7a ) and weekends on [email or web address]

## 2022-02-21 ENCOUNTER — Telehealth (HOSPITAL_COMMUNITY): Payer: Self-pay | Admitting: Pharmacy Technician

## 2022-02-21 ENCOUNTER — Inpatient Hospital Stay (HOSPITAL_COMMUNITY)
Admission: RE | Admit: 2022-02-21 | Discharge: 2022-02-24 | DRG: 945 | Disposition: A | Discharge status: Home / Self Care | Payer: Medicaid Other | Source: Intra-hospital | Attending: Physical Medicine and Rehabilitation | Admitting: Physical Medicine and Rehabilitation

## 2022-02-21 ENCOUNTER — Other Ambulatory Visit: Payer: Self-pay

## 2022-02-21 ENCOUNTER — Other Ambulatory Visit (HOSPITAL_COMMUNITY): Payer: Self-pay

## 2022-02-21 ENCOUNTER — Encounter (HOSPITAL_COMMUNITY): Payer: Self-pay | Admitting: Physical Medicine & Rehabilitation

## 2022-02-21 DIAGNOSIS — Z7982 Long term (current) use of aspirin: Secondary | ICD-10-CM

## 2022-02-21 DIAGNOSIS — J810 Acute pulmonary edema: Secondary | ICD-10-CM | POA: Diagnosis present

## 2022-02-21 DIAGNOSIS — Z79899 Other long term (current) drug therapy: Secondary | ICD-10-CM

## 2022-02-21 DIAGNOSIS — F1290 Cannabis use, unspecified, uncomplicated: Secondary | ICD-10-CM | POA: Diagnosis present

## 2022-02-21 DIAGNOSIS — I248 Other forms of acute ischemic heart disease: Secondary | ICD-10-CM | POA: Diagnosis present

## 2022-02-21 DIAGNOSIS — I110 Hypertensive heart disease with heart failure: Secondary | ICD-10-CM | POA: Diagnosis present

## 2022-02-21 DIAGNOSIS — Z8701 Personal history of pneumonia (recurrent): Secondary | ICD-10-CM | POA: Diagnosis not present

## 2022-02-21 DIAGNOSIS — F419 Anxiety disorder, unspecified: Secondary | ICD-10-CM | POA: Diagnosis present

## 2022-02-21 DIAGNOSIS — R5381 Other malaise: Secondary | ICD-10-CM | POA: Diagnosis present

## 2022-02-21 DIAGNOSIS — R600 Localized edema: Secondary | ICD-10-CM | POA: Diagnosis present

## 2022-02-21 DIAGNOSIS — I10 Essential (primary) hypertension: Secondary | ICD-10-CM | POA: Diagnosis present

## 2022-02-21 DIAGNOSIS — I4891 Unspecified atrial fibrillation: Secondary | ICD-10-CM | POA: Diagnosis present

## 2022-02-21 DIAGNOSIS — I083 Combined rheumatic disorders of mitral, aortic and tricuspid valves: Secondary | ICD-10-CM | POA: Diagnosis present

## 2022-02-21 DIAGNOSIS — I480 Paroxysmal atrial fibrillation: Secondary | ICD-10-CM | POA: Diagnosis present

## 2022-02-21 DIAGNOSIS — I5032 Chronic diastolic (congestive) heart failure: Secondary | ICD-10-CM | POA: Diagnosis present

## 2022-02-21 DIAGNOSIS — I340 Nonrheumatic mitral (valve) insufficiency: Secondary | ICD-10-CM | POA: Diagnosis present

## 2022-02-21 DIAGNOSIS — Z8674 Personal history of sudden cardiac arrest: Secondary | ICD-10-CM | POA: Diagnosis not present

## 2022-02-21 DIAGNOSIS — G931 Anoxic brain damage, not elsewhere classified: Secondary | ICD-10-CM | POA: Diagnosis present

## 2022-02-21 DIAGNOSIS — I2510 Atherosclerotic heart disease of native coronary artery without angina pectoris: Secondary | ICD-10-CM | POA: Diagnosis present

## 2022-02-21 DIAGNOSIS — G4700 Insomnia, unspecified: Secondary | ICD-10-CM | POA: Diagnosis present

## 2022-02-21 DIAGNOSIS — I82611 Acute embolism and thrombosis of superficial veins of right upper extremity: Secondary | ICD-10-CM | POA: Diagnosis present

## 2022-02-21 DIAGNOSIS — E876 Hypokalemia: Secondary | ICD-10-CM | POA: Diagnosis present

## 2022-02-21 DIAGNOSIS — K219 Gastro-esophageal reflux disease without esophagitis: Secondary | ICD-10-CM | POA: Diagnosis present

## 2022-02-21 DIAGNOSIS — I4892 Unspecified atrial flutter: Secondary | ICD-10-CM | POA: Diagnosis present

## 2022-02-21 LAB — COMPREHENSIVE METABOLIC PANEL
ALT: 41 U/L (ref 0–44)
AST: 31 U/L (ref 15–41)
Albumin: 2.7 g/dL — ABNORMAL LOW (ref 3.5–5.0)
Alkaline Phosphatase: 105 U/L (ref 38–126)
Anion gap: 10 (ref 5–15)
BUN: 15 mg/dL (ref 6–20)
CO2: 18 mmol/L — ABNORMAL LOW (ref 22–32)
Calcium: 9 mg/dL (ref 8.9–10.3)
Chloride: 111 mmol/L (ref 98–111)
Creatinine, Ser: 1.17 mg/dL — ABNORMAL HIGH (ref 0.44–1.00)
GFR, Estimated: 54 mL/min — ABNORMAL LOW (ref >60)
Glucose, Bld: 108 mg/dL — ABNORMAL HIGH (ref 70–99)
Potassium: 4.7 mmol/L (ref 3.5–5.1)
Sodium: 139 mmol/L (ref 135–145)
Total Bilirubin: 0.7 mg/dL (ref 0.3–1.2)
Total Protein: 7.1 g/dL (ref 6.5–8.1)

## 2022-02-21 LAB — CBC
HCT: 34.1 % — ABNORMAL LOW (ref 36.0–46.0)
Hemoglobin: 11.1 g/dL — ABNORMAL LOW (ref 12.0–15.0)
MCH: 29.1 pg (ref 26.0–34.0)
MCHC: 32.6 g/dL (ref 30.0–36.0)
MCV: 89.3 fL (ref 80.0–100.0)
Platelets: 505 10*3/uL — ABNORMAL HIGH (ref 150–400)
RBC: 3.82 MIL/uL — ABNORMAL LOW (ref 3.87–5.11)
RDW: 19.7 % — ABNORMAL HIGH (ref 11.5–15.5)
WBC: 16.5 10*3/uL — ABNORMAL HIGH (ref 4.0–10.5)
nRBC: 0.2 % (ref 0.0–0.2)

## 2022-02-21 LAB — MAGNESIUM: Magnesium: 1.9 mg/dL (ref 1.7–2.4)

## 2022-02-21 LAB — BASIC METABOLIC PANEL
Anion gap: 11 (ref 5–15)
BUN: 14 mg/dL (ref 6–20)
CO2: 19 mmol/L — ABNORMAL LOW (ref 22–32)
Calcium: 9.1 mg/dL (ref 8.9–10.3)
Chloride: 107 mmol/L (ref 98–111)
Creatinine, Ser: 1.15 mg/dL — ABNORMAL HIGH (ref 0.44–1.00)
GFR, Estimated: 56 mL/min — ABNORMAL LOW (ref >60)
Glucose, Bld: 148 mg/dL — ABNORMAL HIGH (ref 70–99)
Potassium: 4.8 mmol/L (ref 3.5–5.1)
Sodium: 137 mmol/L (ref 135–145)

## 2022-02-21 LAB — PHOSPHORUS: Phosphorus: 2.6 mg/dL (ref 2.5–4.6)

## 2022-02-21 LAB — ECHO TEE
MV M vel: 7.4 m/s
MV Peak grad: 219 mmHg
Radius: 0.7 cm

## 2022-02-21 MED ORDER — SACUBITRIL-VALSARTAN 24-26 MG PO TABS
1.0000 | ORAL_TABLET | Freq: Two times a day (BID) | ORAL | Status: DC
  Administered 2022-02-21: 1 via ORAL
  Filled 2022-02-21: qty 1

## 2022-02-21 MED ORDER — PROCHLORPERAZINE MALEATE 5 MG PO TABS: 5.0000 mg | ORAL_TABLET | Freq: Four times a day (QID) | ORAL | Status: DC | PRN

## 2022-02-21 MED ORDER — PANTOPRAZOLE SODIUM 40 MG PO TBEC
40.0000 mg | DELAYED_RELEASE_TABLET | Freq: Every day | ORAL | Status: DC
  Administered 2022-02-21 – 2022-02-23 (×3): 40 mg via ORAL
  Filled 2022-02-21 (×3): qty 1

## 2022-02-21 MED ORDER — BISACODYL 10 MG RE SUPP: 10.0000 mg | Freq: Every day | RECTAL | Status: DC | PRN

## 2022-02-21 MED ORDER — SACUBITRIL-VALSARTAN 24-26 MG PO TABS: 1.0000 | ORAL_TABLET | Freq: Two times a day (BID) | ORAL | 1 refills | Status: DC

## 2022-02-21 MED ORDER — APIXABAN 5 MG PO TABS
5.0000 mg | ORAL_TABLET | Freq: Two times a day (BID) | ORAL | Status: DC
  Administered 2022-02-21 – 2022-02-24 (×6): 5 mg via ORAL
  Filled 2022-02-21 (×6): qty 1

## 2022-02-21 MED ORDER — MAGNESIUM OXIDE -MG SUPPLEMENT 400 (240 MG) MG PO TABS
400.0000 mg | ORAL_TABLET | Freq: Two times a day (BID) | ORAL | Status: DC
  Administered 2022-02-21: 400 mg via ORAL
  Filled 2022-02-21: qty 1

## 2022-02-21 MED ORDER — FLEET ENEMA 7-19 GM/118ML RE ENEM: 1.0000 | ENEMA | Freq: Once | RECTAL | Status: DC | PRN

## 2022-02-21 MED ORDER — SPIRONOLACTONE 12.5 MG HALF TABLET
12.5000 mg | ORAL_TABLET | Freq: Every day | ORAL | Status: DC
  Administered 2022-02-21: 12.5 mg via ORAL
  Filled 2022-02-21: qty 1

## 2022-02-21 MED ORDER — SPIRONOLACTONE 25 MG PO TABS: 12.5000 mg | ORAL_TABLET | Freq: Every day | ORAL | 1 refills | Status: DC

## 2022-02-21 MED ORDER — AMIODARONE HCL 200 MG PO TABS: 200.0000 mg | ORAL_TABLET | Freq: Two times a day (BID) | ORAL | 1 refills | Status: DC

## 2022-02-21 MED ORDER — GUAIFENESIN-DM 100-10 MG/5ML PO SYRP: 5.0000 mL | ORAL_SOLUTION | Freq: Four times a day (QID) | ORAL | Status: DC | PRN

## 2022-02-21 MED ORDER — DOCUSATE SODIUM 50 MG/5ML PO LIQD
100.0000 mg | Freq: Two times a day (BID) | ORAL | Status: DC
  Administered 2022-02-21 – 2022-02-22 (×2): 100 mg via ORAL
  Filled 2022-02-21 (×6): qty 10

## 2022-02-21 MED ORDER — PROCHLORPERAZINE 25 MG RE SUPP: 12.5000 mg | Freq: Four times a day (QID) | RECTAL | Status: DC | PRN

## 2022-02-21 MED ORDER — ALUM & MAG HYDROXIDE-SIMETH 200-200-20 MG/5ML PO SUSP: 30.0000 mL | ORAL | Status: DC | PRN

## 2022-02-21 MED ORDER — DIPHENHYDRAMINE HCL 12.5 MG/5ML PO ELIX: 12.5000 mg | ORAL_SOLUTION | Freq: Four times a day (QID) | ORAL | Status: DC | PRN

## 2022-02-21 MED ORDER — MAGNESIUM SULFATE 2 GM/50ML IV SOLN
2.0000 g | Freq: Once | INTRAVENOUS | Status: DC
Start: 2022-02-21 — End: 2022-02-21

## 2022-02-21 MED ORDER — PROCHLORPERAZINE EDISYLATE 10 MG/2ML IJ SOLN: 5.0000 mg | Freq: Four times a day (QID) | INTRAMUSCULAR | Status: DC | PRN

## 2022-02-21 MED ORDER — QUETIAPINE FUMARATE 50 MG PO TABS: 50.0000 mg | ORAL_TABLET | Freq: Every day | ORAL | 1 refills | Status: DC

## 2022-02-21 MED ORDER — POLYETHYLENE GLYCOL 3350 17 G PO PACK
17.0000 g | PACK | Freq: Every day | ORAL | Status: DC
  Filled 2022-02-21 (×3): qty 1

## 2022-02-21 MED ORDER — TRAZODONE HCL 50 MG PO TABS
75.0000 mg | ORAL_TABLET | Freq: Every day | ORAL | Status: DC
  Administered 2022-02-21 – 2022-02-22 (×2): 75 mg via ORAL
  Filled 2022-02-21 (×2): qty 2

## 2022-02-21 MED ORDER — LOSARTAN POTASSIUM 25 MG PO TABS: 25.0000 mg | ORAL_TABLET | Freq: Every day | ORAL | Status: DC

## 2022-02-21 MED ORDER — APIXABAN 5 MG PO TABS: 5.0000 mg | ORAL_TABLET | Freq: Two times a day (BID) | ORAL | 1 refills | Status: DC

## 2022-02-21 MED ORDER — POLYETHYLENE GLYCOL 3350 17 G PO PACK: 17.0000 g | PACK | Freq: Every day | ORAL | Status: DC | PRN

## 2022-02-21 MED ORDER — SPIRONOLACTONE 12.5 MG HALF TABLET
12.5000 mg | ORAL_TABLET | Freq: Every day | ORAL | Status: DC
  Administered 2022-02-22 – 2022-02-24 (×3): 12.5 mg via ORAL
  Filled 2022-02-21 (×3): qty 1

## 2022-02-21 MED ORDER — CHLORHEXIDINE GLUCONATE 0.12 % MT SOLN
15.0000 mL | Freq: Two times a day (BID) | OROMUCOSAL | Status: DC
  Administered 2022-02-21 – 2022-02-23 (×4): 15 mL via OROMUCOSAL
  Filled 2022-02-21 (×6): qty 15

## 2022-02-21 MED ORDER — AMIODARONE HCL 200 MG PO TABS
200.0000 mg | ORAL_TABLET | Freq: Two times a day (BID) | ORAL | Status: DC
  Administered 2022-02-21 – 2022-02-24 (×6): 200 mg via ORAL
  Filled 2022-02-21 (×6): qty 1

## 2022-02-21 MED ORDER — PANTOPRAZOLE SODIUM 40 MG PO TBEC: 40.0000 mg | DELAYED_RELEASE_TABLET | Freq: Every day | ORAL | 0 refills | Status: DC

## 2022-02-21 MED ORDER — TRAZODONE HCL 50 MG PO TABS: 25.0000 mg | ORAL_TABLET | Freq: Every evening | ORAL | Status: DC | PRN

## 2022-02-21 MED ORDER — SACUBITRIL-VALSARTAN 24-26 MG PO TABS
1.0000 | ORAL_TABLET | Freq: Two times a day (BID) | ORAL | Status: DC
  Administered 2022-02-21 – 2022-02-24 (×6): 1 via ORAL
  Filled 2022-02-21 (×6): qty 1

## 2022-02-21 MED ORDER — MAGNESIUM OXIDE -MG SUPPLEMENT 400 (240 MG) MG PO TABS
400.0000 mg | ORAL_TABLET | Freq: Two times a day (BID) | ORAL | Status: DC
  Administered 2022-02-21 – 2022-02-24 (×6): 400 mg via ORAL
  Filled 2022-02-21 (×6): qty 1

## 2022-02-21 MED ORDER — ACETAMINOPHEN 325 MG PO TABS: 325.0000 mg | ORAL_TABLET | ORAL | Status: DC | PRN

## 2022-02-21 MED ORDER — QUETIAPINE FUMARATE 50 MG PO TABS
50.0000 mg | ORAL_TABLET | Freq: Every day | ORAL | Status: DC
  Administered 2022-02-21 – 2022-02-23 (×3): 50 mg via ORAL
  Filled 2022-02-21 (×3): qty 1

## 2022-02-21 NOTE — Progress Notes (Signed)
Patient ID: Lisa Ayala, female   DOB: [birth year], 56 y.o.   MRN: [id number]  [employer] Joaine notate in chart today. Acct ID#: "[id number] [employer] returned to the patient's bedside for screening and verified patients demographics. No insurance, [place] citizen, divorced, no minors, and last worked in November 2022 and has not applied for Medicaid or disability. She does not mva/voc not aged/blind, condition disabling and no resources. Secured Medicaid application and disability referral for the [employer] ns submit Medicaid application and [employer] referral." [facility]

## 2022-02-21 NOTE — Progress Notes (Signed)
PMR Admission Coordinator Pre-Admission Assessment   Patient: Lisa Ayala is an 56 y.o., female MRN: [id number] DOB: Jul 23, 1966 Height:   Weight: 63 kg   Insurance Information Medicaid potential - currently self pay with no insurance  SECONDARY:       Policy#:      Phone#:    Development worker, community:       Phone#:    [employer] Engineer, [employer]" for patients in Inpatient Rehabilitation Facilities with attached "Privacy Act Commerce Records" was provided and verbally reviewed with: N/A   Emergency Contact Information Contact Information       Name Relation Home Work Mobile    Bodine,[name] Daughter     [phone]           Current Medical History  Patient Admitting Diagnosis: Debility   History of Present Illness: A 56 y.o. female admitted 02/08/22 with aflutter. Plan for TEE/DCCV, pt rapidly declined 5/17 with progressive dyspnea, flash pulmonary edema, and hypoxia requiring intubation; pt briefly suffered asystolic arrest during intubation. Pt with SVT 5/19 requiring emergent cardioversion; recurrent AF with RVR on 5/20 with DCCV attempted x3; spontaneous return to NSR 5/21. 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 ETT 5/17-5/23. AMS noted 5/24; head CT negative for acute abnormality, suspicion for subacute/chronic infarct L cerebellum. PMH includes severe MR, moderate TR, SVT. Pt. With delirium and agitation throughout admission, cleared by 5/29. PT/OT saw Pt. And recommended CIR to assist return PLOF   Patient's medical record from [facility] has been reviewed by the rehabilitation admission coordinator and physician.   Past Medical History      Past Medical History:  Diagnosis Date   Aortic insufficiency     PSVT (paroxysmal supraventricular tachycardia) (HCC)     Severe mitral regurgitation      a. 08/2021 Echo: EF 70-75%, no rwma, mod dil LV, GrII DD, nl RV fxn, massively dil LA, mild-mod dil RA,  severe MR due to lack of central coaptation of MV leaflets. Mild MS. Mod TR. Mild-mod AI.   Tricuspid regurgitation        Has the patient had major surgery during 100 days prior to admission? No   Family History   Family history is unknown by patient.   Current Medications   Current Facility-Administered Medications:    acetaminophen (TYLENOL) tablet 650 mg, 650 mg, Oral, Q4H PRN, Theora Gianotti, NP, 650 mg at 02/12/22 2015   [EXPIRED] amiodarone (NEXTERONE PREMIX) 360-4.14 MG/200ML-% (1.8 mg/mL) IV infusion, 60 mg/hr, Intravenous, Continuous, Last Rate: 33.3 mL/hr at 02/08/22 1600, 60 mg/hr at 02/08/22 1600 **FOLLOWED BY** amiodarone (NEXTERONE PREMIX) 360-4.14 MG/200ML-% (1.8 mg/mL) IV infusion, 60 mg/hr, Intravenous, Continuous, Bensimhon, Shaune Pascal, MD, Last Rate: 33.3 mL/hr at 02/16/22 1308, 60 mg/hr at 02/16/22 1308   bisacodyl (DULCOLAX) suppository 10 mg, 10 mg, Rectal, Once PRN, Harris, Whitney D, NP   chlorhexidine (PERIDEX) 0.12 % solution 15 mL, 15 mL, Mouth Rinse, BID, Candee Furbish, MD, 15 mL at 02/16/22 1000   Chlorhexidine Gluconate Cloth 2 % PADS 6 each, 6 each, Topical, Daily, Donato Heinz, MD, 6 each at 02/16/22 1000   [COMPLETED] diltiazem (CARDIZEM) 1 mg/mL load via infusion 10 mg, 10 mg, Intravenous, Once, 10 mg at 02/14/22 0936 **AND** diltiazem (CARDIZEM) 125 mg in dextrose 5% 125 mL (1 mg/mL) infusion, 5-20 mg/hr, Intravenous, Continuous, Bensimhon, Shaune Pascal, MD, Last Rate: 15 mL/hr at 02/16/22 0800, 15 mg/hr at 02/16/22 0800  docusate (COLACE) 50 MG/5ML liquid 100 mg, 100 mg, Per Tube, BID, Candee Furbish, MD, 100 mg at 02/16/22 4098   feeding supplement (PROSource TF) liquid 45 mL, 45 mL, Per Tube, BID, Harris, Whitney D, NP   feeding supplement (VITAL AF 1.2 CAL) liquid 1,000 mL, 1,000 mL, Per Tube, Continuous, Harris, Whitney D, NP   heparin ADULT infusion 100 units/mL (25000 units/270m), 1,200 Units/hr, Intravenous, Continuous, Rudisill,  TJodean Lima RPH, Last Rate: 12 mL/hr at 02/16/22 1203, 1,200 Units/hr at 02/16/22 1203   hydrALAZINE (APRESOLINE) injection 5 mg, 5 mg, Intravenous, Q2H PRN, SCandee Furbish MD, 5 mg at 02/14/22 1839   insulin aspart (novoLOG) injection 0-15 Units, 0-15 Units, Subcutaneous, Q4H, SCandee Furbish MD, 2 Units at 02/16/22 01191  MEDLINE mouth rinse, 15 mL, Mouth Rinse, q12n4p, SCandee Furbish MD, 15 mL at 02/16/22 1204   metoCLOPramide (REGLAN) injection 10 mg, 10 mg, Intravenous, Q8H, SCandee Furbish MD, 10 mg at 02/16/22 0533   ondansetron ([place] injection 4 mg, 4 mg, Intravenous, Q6H PRN, BTheora Gianotti NP, 4 mg at 02/15/22 0707   pantoprazole (PROTONIX) injection 40 mg, 40 mg, Intravenous, QHS, SCandee Furbish MD, 40 mg at 02/15/22 2145   piperacillin-tazobactam (ZOSYN) IVPB 3.375 g, 3.375 g, Intravenous, Q8H, SCandee Furbish MD, Last Rate: 12.5 mL/hr at 02/16/22 0800, Infusion Verify at 02/16/22 0800   polyethylene glycol (MIRALAX / GLYCOLAX) packet 17 g, 17 g, Per Tube, Daily, SCandee Furbish MD, 17 g at 02/16/22 04782  sodium chloride flush (NS) 0.9 % injection 3 mL, 3 mL, Intravenous, Q12H, Simmons, Brittainy M, PA-C, 3 mL at 02/16/22 1000   spironolactone (ALDACTONE) tablet 12.5 mg, 12.5 mg, Per Tube, Daily, SCandee Furbish MD, 12.5 mg at 02/16/22 09562  Patients Current Diet:  Diet Order                  Diet NPO time specified  Diet effective midnight                         Precautions / Restrictions Precautions Precautions: Fall, Other (comment) Precaution Comments: watch HR; multiple lines Restrictions Weight Bearing Restrictions: No    Has the patient had 2 or more falls or a fall with injury in the past year? No   Prior Activity Level Community (5-7x/wk): Went out daily.  Was working at [employer]12-13 hours a day   Prior Functional Level Self Care: Did the patient need help bathing, dressing, using the toilet or eating? Independent   Indoor  Mobility: Did the patient need assistance with walking from room to room (with or without device)? Independent   Stairs: Did the patient need assistance with internal or external stairs (with or without device)? Independent   Functional Cognition: Did the patient need help planning regular tasks such as shopping or remembering to take medications? Independent   Patient Information Are you of Hispanic, Latino/a,or Spanish origin?: E. Yes, another Hispanic, Latino, or Spanish origin What is your race?: B. Black or African American Do you need or want an interpreter to communicate with a doctor or health care staff?: 0. No   Patient's Response To:  Health Literacy and Transportation Is the patient able to respond to health literacy and transportation needs?: Yes Health Literacy - How often do you need to have someone help you when you read instructions, pamphlets, or other written material from your doctor or pharmacy?: Never  In the past 12 months, has lack of transportation kept you from medical appointments or from getting medications?: No In the past 12 months, has lack of transportation kept you from meetings, work, or from getting things needed for daily living?: No   Development worker, international aid / [place] Devices/Equipment: None   Prior Device Use: Indicate devices/aids used by the patient prior to current illness, exacerbation or injury? None of the above   Current Functional Level Cognition   Overall Cognitive Status: Impaired/Different from baseline Orientation Level: Oriented to person, Oriented to place, Disoriented to time, Disoriented to situation Following Commands: Follows one step commands consistently, Follows one step commands with increased time Safety/Judgement: Decreased awareness of safety, Decreased awareness of deficits General Comments: pt not answering questions appropriately, Pt pleasant and cooperative throughout session, using more words, but they are  not always appropriate for question asked "is this a more comfortable position - Lets get out of here" pt following majority of simple commands with some increased multimodal cues    Extremity Assessment (includes Sensation/Coordination)   Upper Extremity Assessment: RUE deficits/detail, LUE deficits/detail, Generalized weakness RUE Deficits / Details: able to achieve full B shoulder flex/ext, noted poor eccentric control with lowering; apparent dysmetria with RUE, grasp grossly 3+/5 RUE Sensation: WNL, decreased proprioception RUE Coordination: decreased fine motor, decreased gross motor LUE Deficits / Details: able to achieve full B shoulder flex/ext, noted poor eccentric control with lowering, grasp weak grossly  Lower Extremity Assessment: Defer to PT evaluation RLE Deficits / Details: gross supine MMT testing >4/5 strength in RLE LLE Deficits / Details: gross supine MMT testing >3/5 strength in LLE     ADLs   Overall ADL's : Needs assistance/impaired Eating/Feeding: NPO Grooming: Wash/dry face, Moderate assistance, Sitting Grooming Details (indicate cue type and reason): able to use wash cloth to wipe nose, but unable to bring to eyes without assist Upper Body Bathing: Moderate assistance Lower Body Bathing: Moderate assistance Upper Body Dressing : Maximal assistance Lower Body Dressing: Maximal assistance Toilet Transfer: Moderate assistance, Stand-pivot, BSC/3in1 Toilet Transfer Details (indicate cue type and reason): to the left Toileting- Clothing Manipulation and Hygiene: Maximal assistance, +2 for safety/equipment, Sit to/from stand Toileting - Clothing Manipulation Details (indicate cue type and reason): OT in front for balance/boost, NT in back performing peri care Functional mobility during ADLs:  (SPT only this session) General ADL Comments: cognition impacting ability to perform ADL as well as balance, coordination, strength     Mobility   Overal bed mobility: Needs  Assistance Bed Mobility: Supine to Sit, Sit to Supine Supine to sit: Min assist, HOB elevated Sit to supine: Mod assist General bed mobility comments: increased time and effort, cues for attention; minA for scooting hips to EOB, assist for BLE back to into bed     Transfers   Overall transfer level: Needs assistance Equipment used: 1 person hand held assist Transfers: Sit to/from Stand, Bed to chair/wheelchair/BSC Sit to Stand: Mod assist Bed to/from chair/wheelchair/BSC transfer type:: Step pivot Step pivot transfers: Mod assist General transfer comment: ModA for trunk elevation and HHA to stabilize; pivotal steps from bed to [facility] with bilateral HHA and modA to prevent posterior LOB; max verbal cues for sequencing     Ambulation / Gait / Stairs / Wheelchair Mobility         Posture / Balance Balance Overall balance assessment: Needs assistance Sitting balance-Leahy Scale: Fair Standing balance support: During functional activity, Single extremity supported Standing balance-Leahy Scale: Poor Standing balance  comment: reliant on external assist to maintain static standing     Special needs/care consideration Continuous Drip IV  Heparindrip and Special service needs Has coretrak    Previous Home Environment (from acute therapy documentation) Living Arrangements: Children Additional Comments: pt reports living with daughter (unable to state daughter's name), unable to answer home set-up or PLOF questions   Discharge Living Setting Plans for Discharge Living Setting: Other (Comment), Lives with (comment) (Townhouse with daughter) Type of Home at Discharge: Other (Comment) (Townhouse) Discharge Home Layout: Two level, 1/2 bath on main level, Bed/bath upstairs Alternate Level Stairs-Rails: Left Alternate Level Stairs-Number of Steps: 15 Discharge Home Access: Stairs to enter Entrance Stairs-Rails: None Entrance Stairs-Number of Steps: 1 Discharge Bathroom Shower/Tub: Tub/shower unit,  Curtain Discharge Bathroom Toilet: Standard Discharge Bathroom Accessibility: Yes How Accessible: Accessible via walker Does the patient have any problems obtaining your medications?: No (Does not have insurance)   Social/Family/Support Systems Patient Roles: Parent (Has a 33 yo daughter who is in school) Sport and exercise psychologist Information: Makynlee Kressin - daughter - [phone] Anticipated Caregiver: Daughter Ability/Limitations of Caregiver: Daughter goes back to college on August 15-19 Caregiver Availability: 24/7 Discharge Plan Discussed with Primary Caregiver: Yes Is Caregiver In Agreement with Plan?: Yes Does Caregiver/Family have Issues with Lodging/Transportation while Pt is in Rehab?: No   Goals Patient/Family Goal for Rehab: PT/OT/SLP supervision goals Expected length of stay: 74-6 days Pt/Family Agrees to Admission and willing to participate: Yes Program Orientation Provided & Reviewed with Pt/Caregiver Including Roles  & Responsibilities: Yes   Decrease burden of Care through IP rehab admission: N/A   Possible need for SNF placement upon discharge: Not anticipated   Patient Condition: I have reviewed medical records from [facility], spoken with CM, and patient and daughter. I met with patient at the bedside and discussed via phone for inpatient rehabilitation assessment.  Patient will benefit from ongoing PT, OT, and SLP, can actively participate in 3 hours of therapy a day 5 days of the week, and can make measurable gains during the admission.  Patient will also benefit from the coordinated team approach during an Inpatient Acute Rehabilitation admission.  The patient will receive intensive therapy as well as Rehabilitation physician, nursing, social worker, and care management interventions.  Due to bladder management, bowel management, safety, skin/wound care, disease management, medication administration, pain management, and patient education the patient requires 24 hour a day  rehabilitation nursing.  The patient is currently min A-min g with mobility and basic ADLs.  Discharge setting and therapy post discharge at home with home health is anticipated.  Patient has agreed to participate in the Acute Inpatient Rehabilitation Program and will admit today.   Preadmission Screen Completed By:  Retta Diones, 02/16/2022 2:21 PM ______________________________________________________________________   Discussed status with Dr. Dagoberto Ligas  on 02/21/22 at  1140 and received approval for admission today.   Admission Coordinator:  Retta Diones, RN, time 1141/Date 02/21/22    Assessment/Plan: Diagnosis: Does the need for close, 24 hr/day Medical supervision in concert with the patient's rehab needs make it unreasonable for this patient to be served in a less intensive setting? Yes Co-Morbidities requiring supervision/potential complications: PEA arrest, flash pulmonary edema, Afib with RVR, PNA, resp failure s/p extubation; severe MR- needs valve replacement Due to bladder management, bowel management, safety, skin/wound care, disease management, medication administration, pain management, and patient education, does the patient require 24 hr/day rehab nursing? Yes Does the patient require coordinated care of a physician, rehab nurse, PT, OT,  and SLP to address physical and functional deficits in the context of the above medical diagnosis(es)? Yes Addressing deficits in the following areas: balance, endurance, locomotion, strength, transferring, bowel/bladder control, bathing, dressing, feeding, grooming, toileting, and cognition Can the patient actively participate in an intensive therapy program of at least 3 hrs of therapy 5 days a week? Yes The potential for patient to make measurable gains while on inpatient rehab is good and fair Anticipated functional outcomes upon discharge from inpatient rehab: supervision PT, supervision OT, supervision SLP Estimated rehab length of stay to  reach the above functional goals is: 6-9 days Anticipated discharge destination: Home 10. Overall Rehab/Functional Prognosis: good and fair     MD Signature:

## 2022-02-21 NOTE — Progress Notes (Addendum)
Patient ID: Lisa Ayala, female   DOB: 1966/04/26, 56 y.o.   MRN: [id number]     Advanced Heart Failure Rounding Note  PCP-Cardiologist: Will Meredith Leeds, MD   Subjective:    - 5/17 Flash pulmonary edema. PEA arrest. Intubated. ACLS 1-2 min. ROSC. Placed on milrinone, norepi, and lasix drip.  - 5/19 LHC/RHC: Nonobstructive CAD. RA 9, PA 50/10, mean PCWP 12, CI 6.8.  Rapid SVT, underwent emergent DCCV.  - 5/20: Recurrent AF with RVR, DCCV attempted x 3 but unable to hold NSR.  - 5/21: Spontaneous conversion to NSR - 5/22: Back in AF with RVR - 05/23: Extubated. Milrinone off. Dilt gtt + amio gtt for AF w/ RVR.  - 5/26 RUQ u/s normal  In NSR today. HR 90s. BPs elevated most of day yesterday and this morning.   Overall, feeling much better. OOB sitting up in chair. Denies dyspnea. No CP. Tolerating regular diet. Appetite is good.   WBC trending back down. SCr stable. K 4.7    Echo: 5/18 EF 60-65%, RV mild HK, rheumatic MV, severe MR, Mild AI     Objective:   Weight Range: 62.9 kg Body mass index is 21.72 kg/m.   Vital Signs:   Temp:  [98 F (36.7 C)-99.1 F (37.3 C)] 99 F (37.2 C) (05/30 0702) Pulse Rate:  [75-92] 92 (05/30 0702) Resp:  [17] 17 (05/30 0702) BP: (152-173)/(87-102) 156/96 (05/30 0729) SpO2:  [97 %-100 %] 98 % (05/30 0702) Weight:  [62.9 kg] 62.9 kg (05/30 0702) Last BM Date : 02/20/22  Weight change: Filed Weights   02/19/22 0600 02/20/22 0437 02/21/22 0702  Weight: 63.2 kg 62.9 kg 62.9 kg    Intake/Output:   Intake/Output Summary (Last 24 hours) at 02/21/2022 0739 Last data filed at 02/20/2022 2221 Gross per 24 hour  Intake 1284.86 ml  Output --  Net 1284.86 ml      Physical Exam   General:  Well appearing. No respiratory difficulty HEENT: dentition in poor repair  Neck: supple. no JVD. Carotids 2+ bilat; no bruits. No lymphadenopathy or thyromegaly appreciated. Cor: PMI nondisplaced. Regular rate & rhythm. 2/6 MR murmur  Lungs:  clear Abdomen: soft, nontender, nondistended. No hepatosplenomegaly. No bruits or masses. Good bowel sounds. Extremities: no cyanosis, clubbing, rash, edema Neuro: alert & oriented x 3, cranial nerves grossly intact. moves all 4 extremities w/o difficulty. Affect pleasant.   Telemetry   NSR 90s Personally reviewed   Labs    CBC Recent Labs    02/19/22 0701 02/20/22 0518  WBC 14.2* 17.5*  HGB 9.5* 10.4*  HCT 30.1* 32.6*  MCV 89.3 89.6  PLT 405* [id number]*   Basic Metabolic Panel Recent Labs    02/19/22 0701 02/19/22 0711 02/20/22 0518  NA 141  --  142  K 3.2*  --  3.4*  CL 108  --  112*  CO2 25  --  20*  GLUCOSE 102*  --  132*  BUN 19  --  15  CREATININE 1.19*  --  1.14*  CALCIUM 8.4*  --  8.5*  MG 2.0  --  1.9  PHOS  --  3.5  --    Liver Function Tests No results for input(s): AST, ALT, ALKPHOS, BILITOT, PROT, ALBUMIN in the last 72 hours.   No results for input(s): LIPASE, AMYLASE in the last 72 hours. Cardiac Enzymes No results for input(s): CKTOTAL, CKMB, CKMBINDEX, TROPONINI in the last 72 hours.  BNP: BNP (last 3 results) Recent Labs  07/02/21 1335 02/08/22 0351  BNP 1,157.2* 709.9*    ProBNP (last 3 results) No results for input(s): PROBNP in the last 8760 hours.   D-Dimer No results for input(s): DDIMER in the last 72 hours. Hemoglobin A1C No results for input(s): HGBA1C in the last 72 hours. Fasting Lipid Panel Recent Labs    02/20/22 0518  TRIG 64   Thyroid Function Tests No results for input(s): TSH, T4TOTAL, T3FREE, THYROIDAB in the last 72 hours.  Invalid input(s): FREET3  Other results:   Imaging    No results found.   Medications:     Scheduled Medications:  amiodarone  200 mg Oral BID   apixaban  5 mg Oral BID   chlorhexidine  15 mL Mouth Rinse BID   docusate  100 mg Oral BID   mouth rinse  15 mL Mouth Rinse q12n4p   melatonin  3 mg Oral Once   pantoprazole  40 mg Oral QHS   polyethylene glycol  17 g Per Tube  Daily   QUEtiapine  50 mg Oral QHS   sodium chloride flush  3 mL Intravenous Q12H   spironolactone  12.5 mg Oral Daily    Infusions:  amiodarone Stopped (02/20/22 1300)   dexmedetomidine (PRECEDEX) IV infusion Stopped (02/18/22 1029)    PRN Medications: acetaminophen, bisacodyl, hydrALAZINE, ondansetron (ZOFRAN) IV    Patient Profile   56 y/o woman with severe MR and polysubstance abuse Lost to f/u. Admitted with AFL with RVR -> cardiogenic shock  Assessment/Plan   1. Shock, cardiogenic - in setting of severe MR and flash pulmonary edema. Lactic acid 2.6>1.9  - component of septic shock due to PNA. Respiratory + Group C Strep - Resolved  2. PEA Arrest on 5/17 prior to TEE attempt -ACLS x1 min with ROSC -Now recovered    3.  Acute Hypoxic Respiratory Failure due to flash pulmonary edema/PNA -Urgent intubation 5/17, extubated 05/23. O2 stable 3L  -Green sputum with fever, leukocytosis. PCT 4 => Completed Zosyn   -Respiratory Cx + Group C Strep.  - Much improved   4.  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  - Seen by [facility]. Plan MVR once recovered from acute illness  - needs rehab in CIR prior to surgery but family still unsure if they want to pursue. Have encouraged them to pursue CIR as only real way to rehab sufficiently for MVR. They are in agreement    5 . PAF/SVT - A flutter on admit. Spontaneously converted to SR.  - Multiple episodes of AF during hospitalization. - Maintaining NSR today - Continue po amio/Eliquis - Planning MAZE at time of MVR.   6.  AKI due to ATN/shock - resolved  7. Tobacco/Polysubstance abuse  - tox screen +cocaine/THC  8. Hypokalemia  - resolved, K 4.7   9. Anxiety - severe   10. ID/PNA - Respiratory Cx + Group C Strep. Completed  zosyn 5/20->5/27 - WBC trending back down. Encourage  pulmonary toilet   11 Hypertension - BPs elevated yesterday - Continue Spiro 12.5 mg daily  - Add Losartan 25 mg, Eventual Entresto if tolerates ok   12.  Deconditioning - will need CIR (see discussion above)   Length of Stay: [address] PA-C  02/21/2022, 7:39 AM  Advanced Heart Failure Team Pager [phone] (M-F; 7a - 5p)  Please contact [facility] for night-coverage after hours (5p -7a ) and weekends on [email or web address]   Patient seen and examined with the above-signed Advanced Practice Provider and/or Housestaff. I personally reviewed laboratory data, imaging studies and relevant notes. I independently examined the patient and formulated the important aspects of the plan. I have edited the note to reflect any of my changes or salient points. I have personally discussed the plan with the patient and/or family.  Feels good. Denies SOB, orthopnea or PND. Remains in NSR on po amio. BP high  General:  Sitting up in bed No resp difficulty HEENT: normal Neck: supple. no JVD. Carotids 2+ bilat; no bruits. No lymphadenopathy or thryomegaly appreciated. Cor: PMI nondisplaced. Regular rate & rhythm. 2/6 MR Lungs: clear Abdomen: soft, nontender, nondistended. No hepatosplenomegaly. No bruits or masses. Good bowel sounds. Extremities: no cyanosis, clubbing, rash, edema Neuro: alert & orientedx3, cranial nerves grossly intact. moves all 4 extremities w/o difficulty. Affect pleasant  She is stable for d/c from HF perspective. Once again encouraged her to enroll in CIR as the best way to get ready for MVR. I think she can tolerate Entresto. Will switch losartan to Entresto 24/26.   Glori Bickers, MD  9:03 AM

## 2022-02-21 NOTE — Plan of Care (Signed)
  Problem: Education: Goal: Knowledge of General Education information will improve Description: Including pain rating scale, medication(s)/side effects and non-pharmacologic comfort measures Outcome: Adequate for Discharge   Problem: Health Behavior/Discharge Planning: Goal: Ability to manage health-related needs will improve Outcome: Adequate for Discharge   Problem: Clinical Measurements: Goal: Ability to maintain clinical measurements within normal limits will improve Outcome: Adequate for Discharge Goal: Will remain free from infection Outcome: Adequate for Discharge Goal: Diagnostic test results will improve Outcome: Adequate for Discharge Goal: Respiratory complications will improve Outcome: Adequate for Discharge Goal: Cardiovascular complication will be avoided Outcome: Adequate for Discharge   Problem: Activity: Goal: Risk for activity intolerance will decrease Outcome: Adequate for Discharge   Problem: Nutrition: Goal: Adequate nutrition will be maintained Outcome: Adequate for Discharge   Problem: Coping: Goal: Level of anxiety will decrease Outcome: Adequate for Discharge   Problem: Elimination: Goal: Will not experience complications related to bowel motility Outcome: Adequate for Discharge Goal: Will not experience complications related to urinary retention Outcome: Adequate for Discharge   Problem: Pain Managment: Goal: General experience of comfort will improve Outcome: Adequate for Discharge   Problem: Safety: Goal: Ability to remain free from injury will improve Outcome: Adequate for Discharge   Problem: Skin Integrity: Goal: Risk for impaired skin integrity will decrease Outcome: Adequate for Discharge   Problem: SLP Dysphagia Goals Goal: Patient will demonstrate readiness for PO's Description: Patient will demonstrate readiness for PO's and/or instrumental swallow study as evidenced by: Outcome: Adequate for Discharge   Problem: Acute Rehab  PT Goals(only PT should resolve) Goal: Pt Will Go Supine/Side To Sit Outcome: Adequate for Discharge Goal: Patient Will Transfer Sit To/From Stand Outcome: Adequate for Discharge Goal: Pt Will Ambulate Outcome: Adequate for Discharge   Problem: Acute Rehab OT Goals (only OT should resolve) Goal: Pt. Will Perform Grooming Outcome: Adequate for Discharge Goal: Pt. Will Perform Upper Body Dressing Outcome: Adequate for Discharge Goal: Pt. Will Perform Lower Body Dressing Outcome: Adequate for Discharge Goal: Pt. Will Transfer To Toilet Outcome: Adequate for Discharge Goal: Pt. Will Perform Toileting-Clothing Manipulation Outcome: Adequate for Discharge Goal: OT Additional ADL Goal #1 Outcome: Adequate for Discharge

## 2022-02-21 NOTE — Discharge Summary (Signed)
Physician Discharge Summary  Lisa Ayala MHD:[id number] DOB: 1965/10/20 DOA: 02/07/2022  PCP: Pcp, No  Admit date: 02/07/2022  Discharge date: 02/21/2022  Admitted From: Home. Disposition: Acute rehab.  Recommendations for Outpatient Follow-up:  Follow up with PCP in 1-2 weeks. Please obtain BMP/CBC in one week. Advised to follow-up with cardiology as scheduled. Patient is being discharged to acute rehab.  Home Health: None Equipment/Devices: None  Discharge Condition: Stable CODE STATUS:Full code Diet recommendation: Heart Healthy   Brief Summary / Hospital Care: This 56 years old female with PMH significant for aortic insufficiency, PSVT, severe mitral regurgitation, tricuspid regurgitation presented in the EP clinic with complaints of palpitations and shortness of breath.  She was found to be in rapid atrial rhythm and was sent to the ED for further evaluation. She was found to have SVT and was given several rounds of adenosine given with some evidence of atrial flutter when rate slowed.  She was also started on heparin for flutter and is scheduled to have TEE/DCCV.  Prior to that patient declined with progressive dyspnea , flash pulmonary edema and hypoxia requiring intubation.  She also suffered asystole, cardiac arrest during intubation.  Patient is doing better now. PCCM pickup 5/29.   Significant Hospital events 5/16 admit for A flutter 5/17 decompensated in endoscopy prior to TEE. Intubated. Brief asystolic arrest.  5/19 SVT requiring emergent cardioversion 5/24 No major issues overnight, remains in A-fib RVR despite amio and Dilt drips  5/25 No issues overnight 5/26 RUQ pain: imaging, labs neg, improved with BM 5/27 agitation overnight on precedex 5/28 all delirium resolved. 5/30: Cleared from cardiology.  Patient is being discharged to acute rehab.  Discharge Diagnoses:  Principal Problem:   Atrial flutter (HCC) Active Problems:   Rheumatic mitral regurgitation    Cardiogenic shock (HCC)   Acute respiratory failure with hypoxia (HCC)   Malnutrition of moderate degree  Acute hypoxic respiratory failure secondary to flash pulmonary edema. Could be multifactorial. She has completed antibiotics for HCAP. Patient is doing much better. She is now on room air.   Cardiogenic shock and refractory SVT: Patient has severe MR due to rheumatic heart disease. Patient was in cardiogenic shock requiring Levophed support. She is off pressors.  Heart rate is settling down. CHF team is following.   Brief PEA/ cardiac arrest: ACLS protocol provided for 1 minute with ROSC achieved.   Atrial flutter with RVR: Heart rate is now controlled. She was on amiodarone gtt. now transition to p.o. amiodarone. Continue Eliquis for anticoagulation.   Substance abuse: Urine drug screen positive for THC and cocaine Much improved.  Emphasized cessation.   Constipation: Continue bowel regimen.   Delirium: Continue Seroquel, frequent reorientation.    Discharge Instructions  Discharge Instructions     Call MD for:  difficulty breathing, headache or visual disturbances   Complete by: As directed    Call MD for:  persistant dizziness or light-headedness   Complete by: As directed    Diet - low sodium heart healthy   Complete by: As directed    Diet Carb Modified   Complete by: As directed    Discharge instructions   Complete by: As directed    Advised to follow-up with primary care physician in 1 week. Advised to follow-up with cardiology as scheduled. Patient is being discharged to acute rehab.   Increase activity slowly   Complete by: As directed       Allergies as of 02/21/2022   No Known Allergies  Medication List     STOP taking these medications    metoprolol succinate 50 MG 24 hr tablet Commonly known as: TOPROL-XL       TAKE these medications    amiodarone 200 MG tablet Commonly known as: PACERONE Take 1 tablet (200 mg total) by  mouth 2 (two) times daily.   apixaban 5 MG Tabs tablet Commonly known as: ELIQUIS Take 1 tablet (5 mg total) by mouth 2 (two) times daily.   aspirin EC 81 MG tablet Take 81 mg by mouth daily. Swallow whole.   pantoprazole 40 MG tablet Commonly known as: PROTONIX Take 1 tablet (40 mg total) by mouth at bedtime.   QUEtiapine 50 MG tablet Commonly known as: SEROQUEL Take 1 tablet (50 mg total) by mouth at bedtime.   sacubitril-valsartan 24-26 MG Commonly known as: ENTRESTO Take 1 tablet by mouth 2 (two) times daily.   spironolactone 25 MG tablet Commonly known as: ALDACTONE Take 0.5 tablets (12.5 mg total) by mouth daily.               Durable Medical Equipment  (From admission, onward)           Start     Ordered   02/21/22 0956  For home use only DME 3 n 1  Once        02/21/22 0957   02/21/22 0956  For home use only DME Walker rolling  Once       Question Answer Comment  Walker: With [employer] Wheels   Patient needs a walker to treat with the following condition Heart failure ([place])      02/21/22 0957            Follow-up Information     Constance Haw, MD Follow up in 1 week(s).   Specialty: Cardiology Contact information: [address] [place] [address] [phone]         Constance Haw, MD .   Specialty: Cardiology Contact information: [address] [place] [address] [place] [address] [phone]                No Known Allergies  Consultations: Cardiology, PCCM   Procedures/Studies: DG Abd 1 View  Result Date: 02/17/2022 CLINICAL DATA:  Ileus. EXAM: ABDOMEN - 1 VIEW COMPARISON:  Radiograph 01/12/2022 FINDINGS: Feeding 2 with tip in the proximal duodenum. Increasing gaseous distention of the colon. Transverse colon measures 6 cm which is within normal limits. There is gas in the rectum. IMPRESSION: 1. Feeding tube in first portion duodenum. 2. Gas distended colon without evidence obstruction.  Electronically Signed   By: Suzy Bouchard M.D.   On: 02/17/2022 14:25   DG Abd 1 View  Result Date: 02/15/2022 CLINICAL DATA:  Ileus EXAM: ABDOMEN - 1 VIEW COMPARISON:  Portable exam 0820 hours compared to 02/08/2022 FINDINGS: Nonobstructive bowel gas pattern. No bowel dilatation or bowel wall thickening. Improved atelectasis versus consolidation LEFT lower lobe. No urinary tract calcification. Levoconvex thoracolumbar scoliosis. IMPRESSION: No acute abnormalities. Electronically Signed   By: Lavonia Dana M.D.   On: 02/15/2022 08:27   CT HEAD WO CONTRAST (5MM)  Result Date: 02/15/2022 CLINICAL DATA:  Altered mental status. EXAM: CT HEAD WITHOUT CONTRAST TECHNIQUE: Contiguous axial images were obtained from the base of the skull through the vertex without intravenous contrast. RADIATION DOSE REDUCTION: This exam was performed according to the departmental dose-optimization program which includes automated exposure control, adjustment of the mA and/or kV according to  patient size and/or use of iterative reconstruction technique. COMPARISON:  None Available. FINDINGS: Brain: No evidence of acute infarction, hemorrhage, hydrocephalus, extra-axial collection or mass lesion/mass effect. Focal area of low density within the left cerebellar hemisphere is identified, image 7/3. This is suspicious for subacute to chronic infarct. Vascular: No hyperdense vessel or unexpected calcification. Skull: Normal. Negative for fracture or focal lesion. Sinuses/Orbits: No acute finding. Other: None. IMPRESSION: 1. No acute intracranial abnormalities. 2. Focal area of low density within the left cerebellar hemisphere is suspicious for subacute to chronic infarct. Electronically Signed   By: Kerby Moors M.D.   On: 02/15/2022 13:20   CARDIAC CATHETERIZATION  Result Date: 02/10/2022   Ost RCA to Prox RCA lesion is 20% stenosed.   Prox RCA to Mid RCA lesion is 40% stenosed.   Mid LAD lesion is 40% stenosed.   The left  ventricular ejection fraction is 55-65% by visual estimate. Findings: Ao = 139/74 LV = 136/12 RA = 9 RV = 46/10 PA = 50/10 (26) PCW = 12 (no significant v waves) Fick cardiac output/index = 12.2/6.8 PVR = 1.8 WU Ao sat = 99% PA sat = 79%, 80% SVC sat = 85% Assessment: 1. Mild non-obstructive CAD 2. EF 60-65% 3. Severe MR by echo. No significant v-waves in PCWP tracing 4. High cardiac output - may be overestimated by Fick Plan/Discussion: Will plan to wean vent. TCTS consulted for MVR. Glori Bickers, MD 4:27 PM  DG CHEST PORT 1 VIEW  Result Date: 02/13/2022 CLINICAL DATA:  Endotracheal tube placement. EXAM: PORTABLE CHEST 1 VIEW COMPARISON:  Feb 12, 2022. FINDINGS: Stable cardiomegaly. Endotracheal and nasogastric tubes are in grossly good position. Right internal jugular catheter is in good position. Bibasilar atelectasis or edema is noted with associated pleural effusions. Bony thorax is unremarkable. IMPRESSION: Stable support apparatus. Stable bibasilar atelectasis or edema is noted with associated pleural effusions. Electronically Signed   By: Marijo Conception M.D.   On: 02/13/2022 09:23   DG CHEST PORT 1 VIEW  Result Date: 02/12/2022 CLINICAL DATA:  ETT assessment EXAM: PORTABLE CHEST 1 VIEW COMPARISON:  Feb 11, 2022 FINDINGS: The ETT terminates 3 cm above the carina. The right central line terminates in the central SVC. The NG tube terminates below today's film. No pneumothorax. Bilateral effusions, right greater than left. Atelectasis underlying the effusions. Mild interstitial prominence without overt edema. No change in the cardiomediastinal silhouette which is partially obscured by transcutaneous pacer leads. IMPRESSION: 1. Support apparatus as above. 2. Right greater than left pleural effusions with underlying atelectasis. 3. Mild pulmonary venous congestion. Electronically Signed   By: Dorise Bullion III M.D.   On: 02/12/2022 08:20   DG CHEST PORT 1 VIEW  Result Date: 02/11/2022 CLINICAL  DATA:  56 year old female intubated. Cardiogenic shock. EXAM: PORTABLE CHEST 1 VIEW COMPARISON:  Portable chest 02/08/2022 and earlier. FINDINGS: Portable AP semi upright view at 0734 hours. Stable right IJ central line. ETT tip in good position between the clavicles and carina. Enteric tube courses to the abdomen, tip not included. Pacer or resuscitation pads now project over the chest. Ongoing cardiomegaly, cardiac contour raising the possibility of pericardial effusion. Other mediastinal contours are within normal limits. No pneumothorax. Regressed but not resolved diffuse pulmonary interstitial opacity, apical pulmonary vascularity now appears normal. Continued veiling and confluent opacity at both lung bases. IMPRESSION: 1. Satisfactory visible lines and tubes. 2. Cardiomegaly, consider pericardial effusion. 3. Regressed but not resolved pulmonary interstitial opacity, favor regressed pulmonary edema. 4. Continued  bibasilar opacity which probably reflects combination of pleural effusions and lower lobe collapse or consolidation. Electronically Signed   By: Genevie Ann M.D.   On: 02/11/2022 08:00   DG Chest Port 1 View  Result Date: 02/08/2022 CLINICAL DATA:  Nasogastric tube placement and central line placement EXAM: PORTABLE CHEST 1 VIEW COMPARISON:  Earlier today at 2:41 p.m. FINDINGS: 3:57 p.m. Endotracheal tube terminates 2.2 cm above carina. Nasogastric tube extends beyond the inferior aspect of the film. Right internal jugular line tip is favored to terminate at the high right atrium or cavoatrial junction. Numerous leads and wires project over the chest. Patient is rotated left. Mild cardiomegaly. Probable small bilateral pleural effusions. No pneumothorax. Interstitial and airspace disease is symmetric, lower lung predominant, and progressive. IMPRESSION: Mild position degradation. Right internal jugular line tip is favored to terminate at the high right atrium or cavoatrial junction. No pneumothorax.  Nasogastric tube extends beyond the inferior aspect of the film. Progressive interstitial and airspace disease, likely pulmonary edema. Electronically Signed   By: Abigail Miyamoto M.D.   On: 02/08/2022 16:17   DG CHEST PORT 1 VIEW  Result Date: 02/08/2022 CLINICAL DATA:  Shortness of breath EXAM: PORTABLE CHEST 1 VIEW COMPARISON:  02/07/2022 FINDINGS: Endotracheal tube is 3 cm above the carina. Right central line tip at the cavoatrial junction. No pneumothorax. Cardiomegaly. Diffuse bilateral interstitial and airspace opacities. This could reflect edema or infection. No visible effusions or pneumothorax. No acute bony abnormality. IMPRESSION: Cardiomegaly. Diffuse bilateral interstitial and alveolar opacities could reflect edema or infection. Electronically Signed   By: Rolm Baptise M.D.   On: 02/08/2022 15:12   DG Chest Port 1 View  Result Date: 02/07/2022 CLINICAL DATA:  Tachycardia. EXAM: PORTABLE CHEST 1 VIEW COMPARISON:  Chest x-ray 07/02/2021. FINDINGS: Heart is enlarged, unchanged. There are small bilateral pleural effusions. There is some strandy opacities in the lung bases. No pneumothorax or acute fracture. IMPRESSION: 1. Cardiomegaly with small pleural effusions and minimal bibasilar atelectasis/edema. Electronically Signed   By: Ronney Asters M.D.   On: 02/07/2022 19:45   DG Abd Portable 1V  Result Date: 02/15/2022 CLINICAL DATA:  Feeding tube placement EXAM: PORTABLE ABDOMEN - 1 VIEW COMPARISON:  02/16/2020 FINDINGS: Feeding tube enters the stomach with the tip near the gastric antrum. Normal bowel gas pattern Cardiac enlargement. IMPRESSION: Feeding tube tip gastric antrum. Electronically Signed   By: Franchot Gallo M.D.   On: 02/15/2022 13:17   DG Abd Portable 1V  Result Date: 02/08/2022 CLINICAL DATA:  NG tube placement EXAM: PORTABLE ABDOMEN - 1 VIEW COMPARISON:  06/18/2006 FINDINGS: Tip of enteric tube is seen in the antrum of the stomach. Bowel gas pattern is nonspecific. Pelvis is  not included. Haziness in the lower lung fields suggest bilateral pleural effusions. Increased markings in the lower lung fields are noted, more so on the right side suggesting asymmetric pulmonary edema or multifocal pneumonia. IMPRESSION: Tip of enteric tube is seen in the antrum of the stomach. Electronically Signed   By: Elmer Picker M.D.   On: 02/08/2022 16:15   ECHOCARDIOGRAM COMPLETE  Result Date: 02/08/2022    ECHOCARDIOGRAM REPORT   Patient Name:   Lisa Ayala [facility] Date of Exam: 02/08/2022 Medical Rec #:  [id number]       Height:       67.0 in Accession #:    [id number]      Weight:       150.2 lb Date of Birth:  [birth year]  BSA:          1.791 m Patient Age:    75 years        BP:           124/91 mmHg Patient Gender: F               HR:           69 bpm. Exam Location:  Inpatient Procedure: 2D Echo, Cardiac Doppler and Color Doppler                           STAT ECHO Reported to: Dr Buford Dresser on 02/08/2022 5:30:00 PM. Indications:    Mitral valve insufficiency  History:        Patient has prior history of Echocardiogram examinations, most                 recent 08/25/2021. CHF, Mitral Valve Disease; Arrythmias:Atrial                 Flutter.  Sonographer:    Clayton Lefort RDCS (AE) Referring Phys: [id number] [place]  Sonographer Comments: Echo performed with patient supine and on artificial respirator. IMPRESSIONS  1. Left ventricular ejection fraction, by estimation, is 65 to 70%. The left ventricle has normal function. The left ventricle has no regional wall motion abnormalities. There is moderate concentric left ventricular hypertrophy. Left ventricular diastolic parameters are consistent with Grade III diastolic dysfunction (restrictive).  2. Right ventricular systolic function is mildly reduced. The right ventricular size is normal. There is mildly elevated pulmonary artery systolic pressure.  3. Left atrial size was severely dilated.  4. Right atrial size was mildly  dilated.  5. A small pericardial effusion is present.  6. 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 Trivial mitral stenosis.  7. Tricuspid valve regurgitation is moderate.  8. The aortic valve is tricuspid. There is mild calcification of the aortic valve. Aortic valve regurgitation is mild.  9. The inferior vena cava is dilated in size with <50% respiratory variability, suggesting right atrial pressure of 15 mmHg. Comparison(s): Prior images reviewed side by side. Conclusion(s)/Recommendation(s): No significant changes from prior--discussed with Dr. Gardiner Rhyme. Severe MR. Valves better seen on prior imaging. FINDINGS  Left Ventricle: Left ventricular ejection fraction, by estimation, is 65 to 70%. The left ventricle has normal function. The left ventricle has no regional wall motion abnormalities. The left ventricular internal cavity size was normal in size. There is  moderate concentric left ventricular hypertrophy. Left ventricular diastolic parameters are consistent with Grade III diastolic dysfunction (restrictive). Right Ventricle: The right ventricular size is normal. Right vetricular wall thickness was not well visualized. Right ventricular systolic function is mildly reduced. There is mildly elevated pulmonary artery systolic pressure. The tricuspid regurgitant velocity is 2.69 m/s, and with an assumed right atrial pressure of 15 mmHg, the estimated right ventricular systolic pressure is [id number] mmHg. Left Atrium: Left atrial size was severely dilated. Right Atrium: Right atrial size was mildly dilated. Pericardium: A small pericardial effusion is present. Mitral Valve: The mitral valve is rheumatic. There is severe thickening of the mitral valve leaflet(s). Moderately decreased mobility of the mitral valve leaflets. Severe mitral valve regurgitation, with eccentric posteriorly directed jet. Trivial mitral  valve stenosis. MV peak gradient, 4.5 mmHg. The mean mitral valve gradient is 1.0  mmHg. Tricuspid Valve: The tricuspid valve is not well visualized. Tricuspid valve regurgitation is moderate. Aortic Valve: The aortic valve  is tricuspid. There is mild calcification of the aortic valve. Aortic valve regurgitation is mild. Aortic valve mean gradient measures 4.0 mmHg. Aortic valve peak gradient measures 7.8 mmHg. Aortic valve area, by VTI measures 2.10 cm. Pulmonic Valve: The pulmonic valve was not well visualized. Pulmonic valve regurgitation is trivial. No evidence of pulmonic stenosis. Aorta: The aortic arch was not well visualized and the aortic root and ascending aorta are structurally normal, with no evidence of dilitation. Venous: The inferior vena cava is dilated in size with less than 50% respiratory variability, suggesting right atrial pressure of 15 mmHg. IAS/Shunts: The atrial septum is grossly normal.  LEFT VENTRICLE PLAX 2D LVIDd:         3.60 cm     Diastology LVIDs:         2.20 cm     LV e' medial:    5.25 cm/s LV PW:         1.60 cm     LV E/e' medial:  20.0 LV IVS:        1.70 cm     LV e' lateral:   9.31 cm/s LVOT diam:     1.90 cm     LV E/e' lateral: 11.3 LV SV:         48 LV SV Index:   27 LVOT Area:     2.84 cm  LV Volumes (MOD) LV vol d, MOD A2C: 95.1 ml LV vol d, MOD A4C: 90.8 ml LV vol s, MOD A2C: 41.9 ml LV vol s, MOD A4C: 44.1 ml LV SV MOD A2C:     53.2 ml LV SV MOD A4C:     90.8 ml LV SV MOD BP:      49.0 ml RIGHT VENTRICLE RV Basal diam:  3.30 cm RV S prime:     6.87 cm/s TAPSE (M-mode): 1.2 cm LEFT ATRIUM              Index        RIGHT ATRIUM           Index LA diam:        2.80 cm  1.56 cm/m   RA Area:     19.00 cm LA Vol (A2C):   171.0 ml 95.50 ml/m  RA Volume:   49.00 ml  27.37 ml/m LA Vol (A4C):   119.0 ml 66.46 ml/m LA Biplane Vol: 153.0 ml 85.45 ml/m  AORTIC VALVE AV Area (Vmax):    2.15 cm AV Area (Vmean):   2.05 cm AV Area (VTI):     2.10 cm AV Vmax:           140.00 cm/s AV Vmean:          89.000 cm/s AV VTI:            0.228 m AV Peak Grad:       7.8 mmHg AV Mean Grad:      4.0 mmHg LVOT Vmax:         106.00 cm/s LVOT Vmean:        64.500 cm/s LVOT VTI:          0.169 m LVOT/AV VTI ratio: 0.74  AORTA Ao Root diam: 2.90 cm Ao Asc diam:  3.40 cm MITRAL VALVE                  TRICUSPID VALVE MV Area (PHT): 1.55 cm       TR Peak grad:   28.9 mmHg MV Area VTI:  1.36 cm       TR Vmax:        269.00 cm/s MV Peak grad:  4.5 mmHg MV Mean grad:  1.0 mmHg       SHUNTS MV Vmax:       1.06 m/s       Systemic VTI:  0.17 m MV Vmean:      50.6 cm/s      Systemic Diam: 1.90 cm MV Decel Time: 488 msec MR Peak grad:    100.8 mmHg MR Mean grad:    65.0 mmHg MR Vmax:         502.00 cm/s MR Vmean:        378.0 cm/s MR PISA:         6.28 cm MR PISA Eff ROA: 48 mm MR PISA Radius:  1.00 cm MV E velocity: 105.00 cm/s MV A velocity: 28.30 cm/s MV E/A ratio:  3.71 Buford Dresser MD Electronically signed by Buford Dresser MD Signature Date/Time: 02/08/2022/5:55:39 PM    Final    ECHO TEE  Result Date: 02/21/2022    TRANSESOPHOGEAL ECHO REPORT   Patient Name:   Lisa Ayala [facility] Date of Exam: 02/09/2022 Medical Rec #:  [id number]       Height:       67.0 in Accession #:    [id number]      Weight:       148.6 lb Date of Birth:  01/05/1966        BSA:          1.782 m Patient Age:    59 years        BP:           140/73 mmHg Patient Gender: F               HR:           67 bpm. Exam Location:  Inpatient Procedure: 2D Echo, Color Doppler and Cardiac Doppler Indications:     Mitral Regurgitation  History:         Patient has prior history of Echocardiogram examinations, most                  recent 02/08/2022. Arrythmias:Atrial Flutter.  Sonographer:     Raquel Sarna Senior RDCS Referring Phys:  [phone] AMY D CLEGG Diagnosing Phys: Glori Bickers MD PROCEDURE: After discussion of the risks and benefits of a TEE, an informed consent was obtained from the patient. The transesophogeal probe was passed without difficulty through the esophogus of the patient. Sedation performed by  performing physician. The patient developed no complications during the procedure. IMPRESSIONS  1. Left ventricular ejection fraction, by estimation, is 60 to 65%. The left ventricle has normal function. The left ventricle has no regional wall motion abnormalities.  2. Right ventricular systolic function is mildly reduced. The right ventricular size is normal.  3. Left atrial size was severely dilated. No left atrial/left atrial appendage thrombus was detected.  4. A small pericardial effusion is present. The pericardial effusion is localized near the right atrium and anterior to the right ventricle.  5. 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  6. The aortic valve is tricuspid. Aortic valve regurgitation is mild.  7. There is mild (Grade II) plaque involving the descending aorta. FINDINGS  Left Ventricle: Left ventricular ejection fraction, by estimation, is 60 to 65%. The left ventricle has normal function. The left ventricle has no regional wall motion abnormalities. The left ventricular  internal cavity size was normal in size. Right Ventricle: The right ventricular size is normal. No increase in right ventricular wall thickness. Right ventricular systolic function is mildly reduced. Left Atrium: Left atrial size was severely dilated. No left atrial/left atrial appendage thrombus was detected. Right Atrium: Right atrial size was normal in size. Pericardium: A small pericardial effusion is present. The pericardial effusion is localized near the right atrium and anterior to the right ventricle. Mitral Valve: The mitral valve is rheumatic. There is moderate thickening of the mitral valve leaflet(s). Moderately decreased mobility of the mitral valve leaflets. Severe mitral valve regurgitation, with centrally-directed jet. Tricuspid Valve: The tricuspid valve is normal in structure. Tricuspid valve regurgitation is mild. Aortic Valve: The aortic valve is tricuspid. Aortic valve regurgitation  is mild. Pulmonic Valve: The pulmonic valve was grossly normal. Pulmonic valve regurgitation is not visualized. Aorta: The aortic root is normal in size and structure. There is mild (Grade II) plaque involving the descending aorta. IAS/Shunts: No atrial level shunt detected by color flow Doppler.  MR Peak grad:    219.0 mmHg MR Mean grad:    103.0 mmHg MR Vmax:         740.00 cm/s MR Vmean:        455.0 cm/s MR PISA:         3.08 cm MR PISA Eff ROA: 16 mm MR PISA Radius:  0.70 cm Glori Bickers MD Electronically signed by Glori Bickers MD Signature Date/Time: 02/21/2022/10:34:17 AM    Final    VAS US DOPPLER PRE CABG  Result Date: 02/15/2022 PREOPERATIVE VASCULAR EVALUATION Patient Name:  Lisa Ayala [facility]  Date of Exam:   02/15/2022 Medical Rec #: [id number]        Accession #:    [id number] Date of Birth: 03-21-1966         Patient Gender: F Patient Age:   60 years Exam Location:  [facility] Procedure:      VAS US DOPPLER PRE CABG Referring Phys: Remo Lipps HENDRICKSON --------------------------------------------------------------------------------  Indications:      Pre-CABG. Other Factors:    MV disease. Limitations:      Right bandaging/line Comparison Study: No prior study Performing Technologist: Maudry Mayhew MHA, RDMS, RVT, RDCS  Examination Guidelines: A complete evaluation includes B-mode imaging, spectral Doppler, color Doppler, and power Doppler as needed of all accessible portions of each vessel. Bilateral testing is considered an integral part of a complete examination. Limited examinations for reoccurring indications may be performed as noted.  Right Carotid Findings: +----------+--------+--------+--------+--------+--------+           PSV cm/sEDV cm/sStenosisDescribeComments +----------+--------+--------+--------+--------+--------+ CCA Prox  57      13                               +----------+--------+--------+--------+--------+--------+ ICA Prox  62      11                                +----------+--------+--------+--------+--------+--------+ ICA Distal63      16                               +----------+--------+--------+--------+--------+--------+ +----------+--------+-------+----------------+------------+           PSV cm/sEDV cmsDescribe        Arm Pressure +----------+--------+-------+----------------+------------+ Subclavian120  Multiphasic, WNL             +----------+--------+-------+----------------+------------+ +---------+--------+---+--------+--+---------+ VertebralPSV cm/s103EDV cm/s17Antegrade +---------+--------+---+--------+--+---------+ Left Carotid Findings: +----------+--------+-------+--------+----------------------+------------------+           PSV cm/sEDV    StenosisDescribe              Comments                             cm/s                                                    +----------+--------+-------+--------+----------------------+------------------+ CCA Prox  117     16                                                      +----------+--------+-------+--------+----------------------+------------------+ CCA Distal81      22                                   intimal thickening +----------+--------+-------+--------+----------------------+------------------+ ICA Prox  47      12             heterogenous and                                                          smooth                                   +----------+--------+-------+--------+----------------------+------------------+ ICA Distal62      24                                                      +----------+--------+-------+--------+----------------------+------------------+ ECA       55      8                                                       +----------+--------+-------+--------+----------------------+------------------+ +----------+--------+--------+----------------+------------+  SubclavianPSV cm/sEDV cm/sDescribe        Arm Pressure +----------+--------+--------+----------------+------------+           163             Multiphasic, WNL             +----------+--------+--------+----------------+------------+ +---------+--------+--+--------+--+---------+ VertebralPSV cm/s53EDV cm/s19Antegrade +---------+--------+--+--------+--+---------+  ABI Findings: +--------+------------------+-----+---------+--------+ Right   Rt Pressure (mmHg)IndexWaveform Comment  +--------+------------------+-----+---------+--------+ Brachial                       triphasic         +--------+------------------+-----+---------+--------+ +--------+------------------+-----+---------+-------+ Left  Lt Pressure (mmHg)IndexWaveform Comment +--------+------------------+-----+---------+-------+ [id number]                    triphasic        +--------+------------------+-----+---------+-------+  Right Doppler Findings: +-----------+--------+-----+---------+-----------------------------------------+ Site       PressureIndexDoppler  Comments                                  +-----------+--------+-----+---------+-----------------------------------------+ Brachial                triphasic                                          +-----------+--------+-----+---------+-----------------------------------------+ Radial                  triphasic                                          +-----------+--------+-----+---------+-----------------------------------------+ Ulnar                   triphasic                                          +-----------+--------+-----+---------+-----------------------------------------+ Palmar Arch                      Signal obliterates with radial                                             compression, is unaffected with ulnar                                      comperssion                                +-----------+--------+-----+---------+-----------------------------------------+  Left Doppler Findings: +-----------+--------+-----+---------+--------------------+ Site       PressureIndexDoppler  Comments             +-----------+--------+-----+---------+--------------------+ Brachial   125          triphasic                     +-----------+--------+-----+---------+--------------------+ Radial                  triphasic                     +-----------+--------+-----+---------+--------------------+ Ulnar                   triphasic                     +-----------+--------+-----+---------+--------------------+ Palmar Arch                      Within normal limits +-----------+--------+-----+---------+--------------------+  Summary: Right Carotid: Velocities in the right ICA are consistent with a 1-39% stenosis.  Limited visualization secondary to bandaging. Left Carotid: Velocities in the left ICA are consistent with a 1-39% stenosis. Vertebrals:  Bilateral vertebral arteries demonstrate antegrade flow. Subclavians: Normal flow hemodynamics were seen in bilateral subclavian              arteries.  Electronically signed by Harold Barban MD on 02/15/2022 at 8:52:49 PM.    Final    VAS [place] UPPER EXTREMITY VENOUS DUPLEX  Result Date: 02/15/2022 UPPER VENOUS STUDY  Patient Name:  Lisa Ayala [facility]  Date of Exam:   02/15/2022 Medical Rec #: [id number]        Accession #:    [id number] Date of Birth: 1966/03/30         Patient Gender: F Patient Age:   86 years Exam Location:  [facility] Procedure:      VAS [place] UPPER EXTREMITY VENOUS DUPLEX Referring Phys: Ina Homes --------------------------------------------------------------------------------  Indications: Edema Comparison Study: No prior study Performing Technologist: Maudry Mayhew MHA, RDMS, RVT, RDCS  Examination Guidelines: A complete evaluation includes B-mode imaging, spectral Doppler, color Doppler, and  power Doppler as needed of all accessible portions of each vessel. Bilateral testing is considered an integral part of a complete examination. Limited examinations for reoccurring indications may be performed as noted.  Right Findings: +----------+------------+---------+-----------+----------+------------+ RIGHT     CompressiblePhasicitySpontaneousProperties  Summary    +----------+------------+---------+-----------+----------+------------+ IJV                                Yes              line present +----------+------------+---------+-----------+----------+------------+ Subclavian    Full       Yes       Yes                           +----------+------------+---------+-----------+----------+------------+ Axillary      Full       Yes       Yes                           +----------+------------+---------+-----------+----------+------------+ Brachial      Full       Yes       Yes                           +----------+------------+---------+-----------+----------+------------+ Radial        Full                                               +----------+------------+---------+-----------+----------+------------+ Ulnar         Full                                               +----------+------------+---------+-----------+----------+------------+ Cephalic      Full                                               +----------+------------+---------+-----------+----------+------------+ Basilic  None                                     Acute     +----------+------------+---------+-----------+----------+------------+  Left Findings: +----------+------------+---------+-----------+----------+-------+ LEFT      CompressiblePhasicitySpontaneousPropertiesSummary +----------+------------+---------+-----------+----------+-------+ Subclavian               Yes       Yes                       +----------+------------+---------+-----------+----------+-------+  Summary:  Right: No evidence of deep vein thrombosis in the upper extremity. Findings consistent with acute superficial vein thrombosis involving the right basilic vein.  Left: No evidence of thrombosis in the subclavian.  *See table(s) above for measurements and observations.  Diagnosing physician: Harold Barban MD Electronically signed by Harold Barban MD on 02/15/2022 at 8:52:39 PM.    Final    US Abdomen Limited RUQ (LIVER/GB)  Result Date: 02/17/2022 CLINICAL DATA:  Cholecystitis. EXAM: ULTRASOUND ABDOMEN LIMITED RIGHT UPPER QUADRANT COMPARISON:  None Available. FINDINGS: Gallbladder: No gallstones or wall thickening visualized. No sonographic Murphy sign noted by sonographer. Common bile duct: Diameter: 6 mm. Upper limits of normal. No intrahepatic bile duct dilatation. Liver: No focal lesion identified. Within normal limits in parenchymal echogenicity. Portal vein is patent on color Doppler imaging with normal direction of blood flow towards the liver. Other: None. IMPRESSION: 1. No acute findings. No evidence for cholecystitis. 2. Common bile duct is upper limits of normal measuring 6 mm. Electronically Signed   By: Kerby Moors M.D.   On: 02/17/2022 15:13     Subjective: Patient was seen and examined at bedside.  Overnight events noted.   Patient feels better and patient is being discharged to acute rehab.  Discharge Exam: Vitals:   02/21/22 0729 02/21/22 1150  BP: (!) 156/96 (!) 157/109  Pulse:    Resp:    Temp:    SpO2:     Vitals:   02/21/22 0702 02/21/22 0720 02/21/22 0729 02/21/22 1150  BP: (!) 173/87 (!) 163/99 (!) 156/96 (!) 157/109  Pulse: 92     Resp: 17     Temp: 99 F (37.2 C)     TempSrc: Oral     SpO2: 98%     Weight: 62.9 kg       General: Pt is alert, awake, not in acute distress Cardiovascular: RRR, S1/S2 +, no rubs, no gallops Respiratory: CTA bilaterally, no wheezing, no  rhonchi Abdominal: Soft, NT, ND, bowel sounds + Extremities: no edema, no cyanosis    The results of significant diagnostics from this hospitalization (including imaging, microbiology, ancillary and laboratory) are listed below for reference.     Microbiology: No results found for this or any previous visit (from the past 240 hour(s)).   Labs: BNP (last 3 results) Recent Labs    07/02/21 1335 02/08/22 0351  BNP 1,157.2* 123456*   Basic Metabolic Panel: Recent Labs  Lab 02/16/22 1557 02/16/22 2001 02/17/22 0527 02/18/22 0320 02/19/22 0701 02/19/22 0711 02/20/22 0518 02/21/22 0604  NA  --    < > 136 137 141  --  142 139  K  --    < > 3.8 3.0* 3.2*  --  3.4* 4.7  CL  --    < > 100 102 108  --  112* 111  CO2  --    < > 26 23 25   --  20* 18*  GLUCOSE  --    < > 107* 112* 102*  --  132* 108*  BUN  --    < > 28* 19 19  --  15 15  CREATININE  --    < > 1.07* 1.11* 1.19*  --  1.14* 1.17*  CALCIUM  --    < > 8.6* 8.7* 8.4*  --  8.5* 9.0  MG 2.1  --  2.0 2.0 2.0  --  1.9 1.9  PHOS 1.7*  --  1.7* 2.4*  --  3.5  --  2.6   < > = values in this interval not displayed.   Liver Function Tests: Recent Labs  Lab 02/17/22 0527 02/21/22 0604  AST 24 31  ALT 27 41  ALKPHOS 111 105  BILITOT 0.7 0.7  PROT 6.5 7.1  ALBUMIN 2.2* 2.7*   No results for input(s): LIPASE, AMYLASE in the last 168 hours. Recent Labs  Lab 02/15/22 1214  AMMONIA 14   CBC: Recent Labs  Lab 02/17/22 0527 02/18/22 0320 02/19/22 0701 02/20/22 0518 02/21/22 0604  WBC 22.0* 16.4* 14.2* 17.5* 16.5*  HGB 11.2* 10.8* 9.5* 10.4* 11.1*  HCT 34.9* 33.5* 30.1* 32.6* 34.1*  MCV 88.8 87.0 89.3 89.6 89.3  PLT 409* 388 405* 434* 505*   Cardiac Enzymes: No results for input(s): CKTOTAL, CKMB, CKMBINDEX, TROPONINI in the last 168 hours. BNP: Invalid input(s): POCBNP CBG: Recent Labs  Lab 02/20/22 0435 02/20/22 0724 02/20/22 1133 02/20/22 1623 02/20/22 2118  GLUCAP 124* 140* 90 99 150*   D-Dimer No  results for input(s): DDIMER in the last 72 hours. Hgb A1c No results for input(s): HGBA1C in the last 72 hours. Lipid Profile Recent Labs    02/20/22 0518  TRIG 64   Thyroid function studies No results for input(s): TSH, T4TOTAL, T3FREE, THYROIDAB in the last 72 hours.  Invalid input(s): FREET3 Anemia work up No results for input(s): VITAMINB12, FOLATE, FERRITIN, TIBC, IRON, RETICCTPCT in the last 72 hours. Urinalysis    Component Value Date/Time   COLORURINE AMBER (A) 02/07/2022 2022   APPEARANCEUR HAZY (A) 02/07/2022 2022   LABSPEC 1.021 02/07/2022 2022   PHURINE 5.0 02/07/2022 2022   GLUCOSEU NEGATIVE 02/07/2022 2022   HGBUR NEGATIVE 02/07/2022 2022   BILIRUBINUR NEGATIVE 02/07/2022 2022   [place] 02/07/2022 2022   PROTEINUR 100 (A) 02/07/2022 2022   NITRITE NEGATIVE 02/07/2022 2022   LEUKOCYTESUR LARGE (A) 02/07/2022 2022   Sepsis Labs Invalid input(s): PROCALCITONIN,  WBC,  LACTICIDVEN Microbiology No results found for this or any previous visit (from the past 240 hour(s)).   Time coordinating discharge: Over 30 minutes  SIGNED:   Shawna Clamp, MD  [facility] 02/21/2022, 3:11 PM Pager   If 7PM-7AM, please contact night-coverage

## 2022-02-21 NOTE — Progress Notes (Signed)
Physical Therapy Treatment Patient Details Name: Lisa Ayala MRN: [id number] DOB: Jan 01, 1966 Today's Date: 02/21/2022   History of Present Illness Pt is a 56 y.o. female admitted 02/08/22 with aflutter. Plan for TEE/DCCV, pt rapidly declined 5/17 with progressive dyspnea, flash pulmonary edema, and hypoxia requiring intubation; pt briefly suffered asystolic arrest during intubation. Pt with SVT 5/19 requiring emergent cardioversion; recurrent AF with RVR on 5/20 with DCCV attempted x3; spontaneous return to NSR 5/21. 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 ETT 5/17-5/23. AMS noted 5/24; head CT negative for acute abnormality, suspicion for subacute/chronic infarct L cerebellum. Agitation overnight 5/26 requiring precedex. PMH includes severe MR, moderate TR, SVT.    PT Comments    Pt received in chair, agreeable to therapy session and with good participation and tolerance for gait/stair progression and standing balance tasks. Pt has met PT goals, able to perform transfers modI unassisted not using arms and scored 23/24 on Dynamic Gait Index. Pt score indicates improved balance from previous sessions, pt now at low risk of falls. Disposition updated per pt rapid progress (after discussion with supervising PT Jaclyn R), pt notified she may be doing too well for CIR admission although reporting she is eager to attempt to get her strength/endurance up. Case mgmt/CIR rep notified.  Recommendations for follow up therapy are one component of a multi-disciplinary discharge planning process, led by the attending physician.  Recommendations may be updated based on patient status, additional functional criteria and insurance authorization.  Follow Up Recommendations  Outpatient PT (to prepare pt for MVR surgery and build endurance)     Assistance Recommended at Discharge Frequent or constant Supervision/Assistance  Patient can return home with the following  Assistance with cooking/housework;Assist for transportation;Direct supervision/assist for medications management;A little help with bathing/dressing/bathroom   Equipment Recommendations  None recommended by PT    Recommendations for Other Services       Precautions / Restrictions Precautions Precautions: Fall Precaution Comments: urinary urgency Restrictions Weight Bearing Restrictions: No     Mobility  Bed Mobility               General bed mobility comments: received in chair    Transfers Overall transfer level: Modified independent Equipment used: None Transfers: Sit to/from Stand Sit to Stand: Modified independent (Device/Increase time)           General transfer comment: from recliner using hands then x5 reps with arms crossed, no LOB    Ambulation/Gait Ambulation/Gait assistance: Supervision, Min guard Gait Distance (Feet): 300 Feet Assistive device: None Gait Pattern/deviations: Step-through pattern, Narrow base of support       General Gait Details: see DGI tasks; no LOB during balance challenges or gait without AD; min guard initially and with backward walking, progressed to supervision for safety, pt modI in room prior to session (increased time)   Stairs Stairs: Yes Stairs assistance: Modified independent (Device/Increase time) Stair Management: One rail Left, Alternating pattern, Forwards Number of Stairs: 3 General stair comments: pt using single handrail to descend steps with good eccentric control with reciprocal pattern, no LOB     Balance Overall balance assessment: Needs assistance Sitting-balance support: No upper extremity supported, Feet supported Sitting balance-Leahy Scale: Normal     Standing balance support: No upper extremity supported, Single extremity supported, Bilateral upper extremity supported, During functional activity Standing balance-Leahy Scale: Normal Standing balance comment: pt performed feet together,  semi-tandem and full tandem stances for 60 seconds unsupported no  LOB     Tandem Stance - Right Leg: 60 Tandem Stance - Left Leg: 60     High level balance activites: Backward walking, Direction changes, Turns, Sudden stops, Head turns High Level Balance Comments: no LOB with feet together, semi-tandem and full tandem stance 60 sec eyes open (needed UE support to get to position but then no LOB), needs reminder cues to maintain for 60 sec) Standardized Balance Assessment Standardized Balance Assessment : Dynamic Gait Index   Dynamic Gait Index Level Surface: Normal Change in Gait Speed: Normal Gait with Horizontal Head Turns: Normal Gait with Vertical Head Turns: Normal Gait and Pivot Turn: Normal Step Over Obstacle: Normal Step Around Obstacles: Normal Steps: Mild Impairment Total Score: 23      Cognition Arousal/Alertness: Awake/alert Behavior During Therapy: WFL for tasks assessed/performed Overall Cognitive Status: Within Functional Limits for tasks assessed                         Following Commands: Follows one step commands consistently, Follows multi-step commands consistently   Awareness: Anticipatory   General Comments: good awareness of safety, pt reports 2/10 modified RPE post-activity but had reported more fatigued earlier, pt able to navigate back to room from hallway/stair ascent without hints/able to recall her room number. Much improved attention and command following but still mildly impulsive (cued to wait 60 seconds for tandem stances but pt stepping apart after ~20 seconds, agrees to continue however when cued).        Exercises Other Exercises Other Exercises: STS x 5 in 21 sec arms crossed from chair Gait and stairs billed as TE for BLE strengthening Standing balance tasks feet together, semi-tandem each foot advanced and FT stances ea foot advanced x60 sec ea (x 5 mins)   General Comments General comments (skin integrity, edema, etc.): BP  mildly elevated prior to activity but improved post-session when checked; BP pre- sitting 159/94, standing BP 157/109; seated BP post-exertion 149/88, HR/SpO2 WFL on RA      Pertinent Vitals/Pain Pain Assessment Pain Assessment: No/denies pain           PT Goals (current goals can now be found in the care plan section) Acute Rehab PT Goals Patient Stated Goal: to get stronger and get my teeth fixed so I can get my MVR surgery PT Goal Formulation: With patient Time For Goal Achievement: 03/01/22 Progress towards PT goals: Progressing toward goals;PT to reassess next treatment (will need goal update next session vs DC acute PT, she met her goals today)    Frequency    Min 3X/week      PT Plan Discharge plan needs to be updated       AM-PAC PT "6 Clicks" Mobility   Outcome Measure  Help needed turning from your back to your side while in a flat bed without using bedrails?: None Help needed moving from lying on your back to sitting on the side of a flat bed without using bedrails?: None Help needed moving to and from a bed to a chair (including a wheelchair)?: None Help needed standing up from a chair using your arms (e.g., wheelchair or bedside chair)?: None Help needed to walk in hospital room?: None Help needed climbing 3-5 steps with a railing? : A Little 6 Click Score: 23    End of Session Equipment Utilized During Treatment: Gait belt Activity Tolerance: Patient tolerated treatment well Patient left: in chair;with call bell/phone within reach;with family/visitor present (daughter present)  Nurse Communication: Mobility status;Other (comment) (case mgr notified of pt improved balance today) PT Visit Diagnosis: Other abnormalities of gait and mobility (R26.89);Muscle weakness (generalized) (M62.81)     Time: 8115-7262 PT Time Calculation (min) (ACUTE ONLY): 21 min  Charges:  $Therapeutic Exercise: 8-22 mins                     Gizell Danser P., PTA Acute Rehabilitation  Services Secure Chat Preferred 9a-5:30pm Office: [place] 02/21/2022, 12:35 PM

## 2022-02-21 NOTE — Discharge Instructions (Signed)
Advised to follow-up with primary care physician in 1 week. Advised to follow-up with cardiology as scheduled. Patient is being discharged to acute rehab.

## 2022-02-21 NOTE — Telephone Encounter (Signed)
Patient Advocate Encounter  Completed and sent application for Eliquis 5 mg for this patient who is insured but needs copay assistance.     This encounter will be updated until final determination.    Roland Earl, CPhT Pharmacy Patient Advocate Specialist [facility] Patient Advocate Team Direct Number: [phone] Fax: [phone]

## 2022-02-21 NOTE — H&P (Signed)
Physical Medicine and Rehabilitation Admission H&P        Chief Complaint  Patient presents with   Anoxic brain injury      HPI:  Lisa Ayala is a 56 year old L handed female with history of PSVT, severe MR, moder TR and mild to moderate AI, anxiety d/o, regular marijuana use who was admitted on 02/07/22 via cardiology office with reports of tachycardia and rapid breathing with SOB and found to have SVT/ A flutter. She was received adenosine and started on IV heparin with plans for TEE. She developed hypoxia with saturations in 80's and found to have acute on chronic diastolic heart failure with demand ischemia and transaminitis as well as UDS positive for THC/Cocaine.  She was treated with IV diuresis and started on IV amiodarone 05/17 due to ongoing issues with A flutter. She had worsening of respiratory status prior to TEE therefore procedure aborted, she was intubated and required pressors but deteriorated to PEA arrest requiring CPR/epi X 1 minute with ROSC and was placed on milrinone and lasix drip.   She had issues with agitation requiring precedex. She continued to have SVT with HR in 200 requiring emergent DCCV on 05/19 and 05/20.  She developed fever with leucocytosis, and found to have purulent sputum via ETT. She was started on Zosyn for Group C strep PNA.    Electrolyte abnormalities supplemented and AKI due to ATN/shock is resolving. Dr. Dorris FetchHendrickson consulted and following for possible MVR once medically optimized.  She tolerated extubation to 4 L oxygen per  on 05/23 and tolerating regular diet. PT/OT evaluations completed and patient noted to have weakness with balance deficits. CIR recommended due to functional decline.      Pt reports not sleeping well- also her heart rate "goes really fast when has stress".  LBM today- and peeing OK- no SOB/DOE.        Review of Systems  Constitutional:  Negative for chills and fever.  HENT:  Negative for hearing loss and  tinnitus.   Eyes:  Positive for blurred vision.  Respiratory:  Negative for shortness of breath.   Cardiovascular:  Positive for leg swelling. Negative for chest pain and palpitations.  Gastrointestinal:  Negative for constipation, heartburn and nausea.  Genitourinary:  Negative for dysuria and urgency.  Musculoskeletal:  Negative for back pain and myalgias.  Neurological:  Positive for sensory change (numbness/tingling bilateral hands) and weakness. Negative for dizziness and headaches.  Psychiatric/Behavioral:  Negative for memory loss. The patient does not have insomnia.   All other systems reviewed and are negative.         Past Medical History:  Diagnosis Date   Aortic insufficiency     PSVT (paroxysmal supraventricular tachycardia) (HCC)     Severe mitral regurgitation      a. 08/2021 Echo: EF 70-75%, no rwma, mod dil LV, GrII DD, nl RV fxn, massively dil LA, mild-mod dil RA, severe MR due to lack of central coaptation of MV leaflets. Mild MS. Mod TR. Mild-mod AI.   Tricuspid regurgitation             Past Surgical History:  Procedure Laterality Date   RIGHT/LEFT HEART CATH AND CORONARY ANGIOGRAPHY N/A 02/10/2022    Procedure: RIGHT/LEFT HEART CATH AND CORONARY ANGIOGRAPHY;  Surgeon: Dolores PattyBensimhon, Daniel R, MD;  Location: [facility]VASIVE [facility];  Service: Cardiovascular;  Laterality: N/A;   TEE WITHOUT CARDIOVERSION N/A 02/08/2022    Procedure: TRANSESOPHAGEAL ECHOCARDIOGRAM (TEE);  Surgeon: Lennie Odor'Neal, [place]  Maisie Fus, MD;  Location: [facility] ENDOSCOPY;  Service: Cardiovascular;  Laterality: N/A;      Family History: Mother alive but family lives in [place] and history unknown.    Social History:  Divorced. Single and lives  alone but daughter here from college. 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 She  reports that she has never smoked. She has never used smokeless tobacco. She reports that she does not currently use alcohol. She reports current drug use. Drug: Marijuana--4-5 blunts per day.  .     Allergies: No Known Allergies           Medications Prior to Admission  Medication Sig Dispense Refill   aspirin EC 81 MG tablet Take 81 mg by mouth daily. Swallow whole.       metoprolol succinate (TOPROL-XL) 50 MG 24 hr tablet TAKE 1 TABLET BY MOUTH DAILY. TAKE WITH OR IMMEDIATELY FOLLOWING A MEAL. 90 tablet 3          Home: Home Living Family/patient expects to be discharged to:: Private residence Living Arrangements: Children Additional Comments: pt reports living with daughter (unable to state daughter's name), unable to answer home set-up or PLOF questions   Functional History: Prior Function Prior Level of Function : Patient poor historian/Family not available Mobility Comments: RN reports daughter stated pt "is usually the life of the party", independent without DME   Functional Status:  Mobility: Bed Mobility Overal bed mobility: Needs Assistance Bed Mobility: Supine to Sit Supine to sit: Supervision, HOB elevated Sit to supine: Min assist General bed mobility comments: received in chair Transfers Overall transfer level: Modified independent Equipment used: None Transfers: Sit to/from Stand Sit to Stand: Modified independent (Device/Increase time) Bed to/from chair/wheelchair/BSC transfer type:: Step pivot Step pivot transfers: Mod assist General transfer comment: from recliner using hands then x5 reps with arms crossed, no LOB Ambulation/Gait Ambulation/Gait assistance: Supervision, Min guard Gait Distance (Feet): 300 Feet Assistive device: None Gait Pattern/deviations: Step-through pattern, Narrow base of support General Gait Details: see DGI tasks; no LOB during balance challenges or gait without AD; min guard initially and with backward walking, progressed to supervision for safety, pt modI in room prior to session (increased time) Gait velocity: Decreased Stairs: Yes Stairs assistance: Modified independent (Device/Increase time) Stair Management:  One rail Left, Alternating pattern, Forwards Number of Stairs: 3 General stair comments: pt using single handrail to descend steps with good eccentric control with reciprocal pattern, no LOB   ADL: ADL Overall ADL's : Needs assistance/impaired Eating/Feeding: NPO Grooming: Wash/dry hands, Wash/dry face, Min guard, Standing Grooming Details (indicate cue type and reason): standing at sink Upper Body Bathing: Moderate assistance Lower Body Bathing: Moderate assistance Upper Body Dressing : Maximal assistance Lower Body Dressing: Maximal assistance Toilet Transfer: Moderate assistance, Stand-pivot, BSC/3in1 Toilet Transfer Details (indicate cue type and reason): to the left Toileting- Clothing Manipulation and Hygiene: Maximal assistance, +2 for safety/equipment, Sit to/from stand Toileting - Clothing Manipulation Details (indicate cue type and reason): OT in front for balance/boost, NT in back performing peri care Functional mobility during ADLs:  (SPT only this session) General ADL Comments: performed grooming standing at sink without seated rest break   Cognition: Cognition Overall Cognitive Status: Within Functional Limits for tasks assessed Orientation Level: Oriented X4 Cognition Arousal/Alertness: Awake/alert Behavior During Therapy: WFL for tasks assessed/performed Overall Cognitive Status: Within Functional Limits for tasks assessed Area of Impairment: Orientation, Attention, Following commands, Safety/judgement, Awareness, Problem solving Orientation Level: Disoriented to, Time, Situation Current Attention Level:  Selective Following Commands: Follows one step commands consistently, Follows multi-step commands consistently Safety/Judgement: Decreased awareness of safety, Decreased awareness of deficits Awareness: Anticipatory Problem Solving: Slow processing, Difficulty sequencing, Requires verbal cues General Comments: good awareness of safety, pt reports 2/10 modified RPE  post-activity but had reported more fatigued earlier, pt able to navigate back to room from hallway/stair ascent without hints/able to recall her room number. Much improved attention and command following but still mildly impulsive (cued to wait 60 seconds for tandem stances but pt stepping apart after ~20 seconds, agrees to continue however when cued).     Blood pressure (!) 156/96, pulse 92, temperature 99 F (37.2 C), temperature source Oral, resp. rate 17, weight 62.9 kg, SpO2 98 %. Physical Exam Vitals and nursing note reviewed. Exam conducted with a chaperone present.  Constitutional:      Appearance: Normal appearance.     Comments: Pt made up/make and hair done- friend at bedside; sitting up in bed; appropriate, interactive, NAD  HENT:     Head: Normocephalic and atraumatic.     Comments: No facial droop    Right Ear: External ear normal.     Left Ear: External ear normal.     Nose: No congestion.     Mouth/Throat:     Mouth: Mucous membranes are moist.     Pharynx: Oropharynx is clear. No oropharyngeal exudate.  Eyes:     General:        Right eye: No discharge.        Left eye: No discharge.     Extraocular Movements: Extraocular movements intact.  Cardiovascular:     Rate and Rhythm: Tachycardia present. Rhythm irregular.     Heart sounds: Murmur heard.     Comments: Severe mitral murmur heard- heart rate ~ 100-110 Pulmonary:     Effort: Pulmonary effort is normal. No respiratory distress.     Breath sounds: Normal breath sounds. No wheezing or rhonchi.  Abdominal:     General: Bowel sounds are normal. There is no distension.     Palpations: Abdomen is soft.     Tenderness: There is no abdominal tenderness.  Musculoskeletal:     Cervical back: Neck supple. No tenderness.     Comments: BLE with min edema--support stockings in place.  5-/5 in all 4 extremities- very slightly weak throughout B/L    Skin:    General: Skin is warm and dry.  Neurological:     Mental  Status: She is alert and oriented to person, place, and time.     Comments: Fair historian Intact to light touch in all 4 extremities  Psychiatric:     Comments: Alternating happy and sad affect      Lab Results Last 48 Hours        Results for orders placed or performed during the hospital encounter of 02/07/22 (from the past 48 hour(s))  Glucose, capillary     Status: Abnormal    Collection Time: 02/19/22  4:35 PM  Result Value Ref Range    Glucose-Capillary 137 (H) 70 - 99 mg/dL      Comment: Glucose reference range applies only to samples taken after fasting for at least 8 hours.  Glucose, capillary     Status: Abnormal    Collection Time: 02/19/22  8:37 PM  Result Value Ref Range    Glucose-Capillary 122 (H) 70 - 99 mg/dL      Comment: Glucose reference range applies only to samples taken after fasting for  at least 8 hours.  Glucose, capillary     Status: Abnormal    Collection Time: 02/20/22 12:15 AM  Result Value Ref Range    Glucose-Capillary 124 (H) 70 - 99 mg/dL      Comment: Glucose reference range applies only to samples taken after fasting for at least 8 hours.  Glucose, capillary     Status: Abnormal    Collection Time: 02/20/22  4:35 AM  Result Value Ref Range    Glucose-Capillary 124 (H) 70 - 99 mg/dL      Comment: Glucose reference range applies only to samples taken after fasting for at least 8 hours.  Magnesium     Status: None    Collection Time: 02/20/22  5:18 AM  Result Value Ref Range    Magnesium 1.9 1.7 - 2.4 mg/dL      Comment: Performed at [facility], [address] [place], [place] [address]  Triglycerides     Status: None    Collection Time: 02/20/22  5:18 AM  Result Value Ref Range    Triglycerides 64 <150 mg/dL      Comment: Performed at [facility], [address], [place], [place] [address]  CBC     Status: Abnormal    Collection Time: 02/20/22  5:18 AM  Result Value Ref Range    WBC 17.5 (H) 4.0 - 10.5 K/uL    RBC 3.64 (L)  3.87 - 5.11 MIL/uL    Hemoglobin 10.4 (L) 12.0 - 15.0 g/dL    HCT 09.8 (L) 11.9 - 46.0 %    MCV 89.6 80.0 - 100.0 fL    MCH 28.6 26.0 - 34.0 pg    MCHC 31.9 30.0 - 36.0 g/dL    RDW 14.7 (H) 82.9 - 15.5 %    Platelets 434 (H) 150 - 400 K/uL    nRBC 0.6 (H) 0.0 - 0.2 %      Comment: Performed at [facility], [address], [place], [place] [address]  Basic metabolic panel     Status: Abnormal    Collection Time: 02/20/22  5:18 AM  Result Value Ref Range    Sodium 142 135 - 145 mmol/L    Potassium 3.4 (L) 3.5 - 5.1 mmol/L    Chloride 112 (H) 98 - 111 mmol/L    CO2 20 (L) 22 - 32 mmol/L    Glucose, Bld 132 (H) 70 - 99 mg/dL      Comment: Glucose reference range applies only to samples taken after fasting for at least 8 hours.    BUN 15 6 - 20 mg/dL    Creatinine, Ser 0.86 (H) 0.44 - 1.00 mg/dL    Calcium 8.5 (L) 8.9 - 10.3 mg/dL    GFR, Estimated 56 (L) >60 mL/min      Comment: (NOTE) Calculated using the CKD-EPI Creatinine Equation (2021)      Anion gap 10 5 - 15      Comment: Performed at [facility], [address], [place], [place] [address]  Glucose, capillary     Status: Abnormal    Collection Time: 02/20/22  7:24 AM  Result Value Ref Range    Glucose-Capillary 140 (H) 70 - 99 mg/dL      Comment: Glucose reference range applies only to samples taken after fasting for at least 8 hours.  Glucose, capillary     Status: None    Collection Time: 02/20/22 11:33 AM  Result Value  Ref Range    Glucose-Capillary 90 70 - 99 mg/dL      Comment: Glucose reference range applies only to samples taken after fasting for at least 8 hours.  Glucose, capillary     Status: None    Collection Time: 02/20/22  4:23 PM  Result Value Ref Range    Glucose-Capillary 99 70 - 99 mg/dL      Comment: Glucose reference range applies only to samples taken after fasting for at least 8 hours.  Glucose, capillary     Status: Abnormal    Collection Time: 02/20/22  9:18 PM  Result Value Ref  Range    Glucose-Capillary 150 (H) 70 - 99 mg/dL      Comment: Glucose reference range applies only to samples taken after fasting for at least 8 hours.  Magnesium     Status: None    Collection Time: 02/21/22  6:04 AM  Result Value Ref Range    Magnesium 1.9 1.7 - 2.4 mg/dL      Comment: Performed at [facility], [address] [place], [place] [address]  CBC     Status: Abnormal    Collection Time: 02/21/22  6:04 AM  Result Value Ref Range    WBC 16.5 (H) 4.0 - 10.5 K/uL    RBC 3.82 (L) 3.87 - 5.11 MIL/uL    Hemoglobin 11.1 (L) 12.0 - 15.0 g/dL    HCT 60.4 (L) 54.0 - 46.0 %    MCV 89.3 80.0 - 100.0 fL    MCH 29.1 26.0 - 34.0 pg    MCHC 32.6 30.0 - 36.0 g/dL    RDW 98.1 (H) 19.1 - 15.5 %    Platelets 505 (H) 150 - 400 K/uL    nRBC 0.2 0.0 - 0.2 %      Comment: Performed at [facility], [address], [place], [place] [address]  Comprehensive metabolic panel     Status: Abnormal    Collection Time: 02/21/22  6:04 AM  Result Value Ref Range    Sodium 139 135 - 145 mmol/L    Potassium 4.7 3.5 - 5.1 mmol/L      Comment: NO VISIBLE HEMOLYSIS    Chloride 111 98 - 111 mmol/L    CO2 18 (L) 22 - 32 mmol/L    Glucose, Bld 108 (H) 70 - 99 mg/dL      Comment: Glucose reference range applies only to samples taken after fasting for at least 8 hours.    BUN 15 6 - 20 mg/dL    Creatinine, Ser 5.62 (H) 0.44 - 1.00 mg/dL    Calcium 9.0 8.9 - 13.0 mg/dL    Total Protein 7.1 6.5 - 8.1 g/dL    Albumin 2.7 (L) 3.5 - 5.0 g/dL    AST 31 15 - 41 U/L    ALT 41 0 - 44 U/L    Alkaline Phosphatase 105 38 - 126 U/L    Total Bilirubin 0.7 0.3 - 1.2 mg/dL    GFR, Estimated 54 (L) >60 mL/min      Comment: (NOTE) Calculated using the CKD-EPI Creatinine Equation (2021)      Anion gap 10 5 - 15      Comment: Performed at [facility], [address] [place], [place] [address]  Phosphorus     Status: None    Collection Time: 02/21/22  6:04 AM  Result Value Ref Range    Phosphorus  2.6 2.5 - 4.6  mg/dL      Comment: Performed at [facility], [address] [place], [place] [address]      Imaging Results (Last 48 hours)  No results found.         Blood pressure (!) 156/96, pulse 92, temperature 99 F (37.2 C), temperature source Oral, resp. rate 17, weight 62.9 kg, SpO2 98 %.   Medical Problem List and Plan: 1. Functional deficits secondary to anoxia from PEA arrest             -patient may  shower             -ELOS/Goals: 4-8 days mod I to supervision 2.  Antithrombotics: -DVT/anticoagulation:  Pharmaceutical: Other (comment)--Eliquis             -antiplatelet therapy: N/A 3. Pain Management: Tylenol prn  4. Mood: LCSW to follow for evaluation and support.              -antipsychotic agents: N/A 5. Neuropsych: This patient may be capable of making decisions on her own behalf. 6. Skin/Wound Care: Routine pressure relief measures 7. Fluids/Electrolytes/Nutrition: Strict I/O. Will add HH restrictions.   8. Severe MR: Plans for MVR w/MAZE in the future 9. SVT/A fib with RVR s/p DCCV: Remains in A fib. Transitioned to po amiodarone on 05/29 -- Continue Eliquis 10. Flash pulmonary edema: Has had issues with BLE edema for weeks. 11. Leucocytosis: Has completed 7 day course of Zosyn for treatment for PNA.             --monitor for fevers and other signs of infection. 12. AKI: Improving. Monitor for stability--recheck CMET in am. 13. HTN: Monitor BP TID--continue Entresto and aldactone.   14. Hypomagnesemia: Supplemented and started on Mag Ox 400 mg bid today --Recheck Mg level in am.   15. Hypokalemia: Resolved with intermittent supplementation.  16. Anemia: Resolving.  17. Insomnia- will start trazodone 75 mg QHS and monitor if needs to be titrated     I have personally performed a face to face diagnostic evaluation of this patient and formulated the key components of the plan.  Additionally, I have personally reviewed laboratory data, imaging studies,  as well as relevant notes and concur with the physician assistant's documentation above.   The patient's status has not changed from the original H&P.  Any changes in documentation from the acute care chart have been noted above.       Jacquelynn Cree, PA-C 02/21/2022

## 2022-02-21 NOTE — Telephone Encounter (Signed)
Patient Advocate Encounter  Completed and sent application for [employer] 24-26 for this patient who is uninsured.      This encounter will be updated until final determination.   Roland Earl, CPhT Pharmacy Patient Advocate Specialist [facility] Patient Advocate Team Direct Number: [phone]  Fax: [phone]

## 2022-02-21 NOTE — Progress Notes (Signed)
Inpatient Rehabilitation Admission Medication Review by a Pharmacist  A complete drug regimen review was completed for this patient to identify any potential clinically significant medication issues.  High Risk Drug Classes Is patient taking? Indication by Medication  Antipsychotic Yes Seroquel for agitation Compazine for N/V  Anticoagulant Yes Eliquis for Afib  Antibiotic No   Opioid No   Antiplatelet No   Hypoglycemics/insulin No   Vasoactive Medication Yes Entresto, spiro for CHF Amiodarone for Afib  Chemotherapy No   Other Yes Protonix for GERD     Type of Medication Issue Identified Description of Issue Recommendation(s)  Drug Interaction(s) (clinically significant)     Duplicate Therapy     Allergy     No Medication Administration End Date     Incorrect Dose     Additional Drug Therapy Needed     Significant med changes from prior encounter (inform family/care partners about these prior to discharge).    Other       Clinically significant medication issues were identified that warrant physician communication and completion of prescribed/recommended actions by midnight of the next day:  No    Time spent performing this drug regimen review (minutes):  20 minutes   Elwin Sleight 02/21/2022 1:52 PM

## 2022-02-21 NOTE — TOC CM/SW Note (Signed)
HF TOC CM spoke to pt and states she prefers to go to IP rehab vs OPPT. Message sent to attending for dc disposition. Received confirmation for IP Rehab coordinator and they are will take pt to day to CIR.   Isidoro Donning RN3 CCM, Heart Failure TOC CM [phone]

## 2022-02-21 NOTE — Progress Notes (Signed)
Inpatient Rehab Admissions Coordinator:  ° °I have a bed for this pt. On CIR today. RN may call report to [phone]. ° °Hoy Fallert, MS, CCC-SLP °Rehab Admissions Coordinator  °[phone] (celll) °[phone] (office) ° °

## 2022-02-21 NOTE — Progress Notes (Signed)
INPATIENT REHABILITATION ADMISSION NOTE   Arrival Method: WC     Mental Orientation: alert and oriented X4   Assessment: done   Skin: done   IV'S: none.    Pain: none   Tubes and Drains:   Safety Measures: reviewed with pt and family   Vital Signs: done   Height and Weight: done   Rehab Orientation: done   Family: at bedside    Notes: done  Marylu Lund, RN

## 2022-02-21 NOTE — Progress Notes (Signed)
VAST consult received to obtain IV access. Patient is not currently receiving any meds/fluids via IV route. Nursing requesting PIV "just in case" as patient went into RVR yesterday and required a bolus of amiodarone. Patient may be discharged today per unit RN. Advised unit RN that patient is refusing IV at this time. If circumstances change and PIV is needed, unit RN will place IV team consult.

## 2022-02-21 NOTE — H&P (Signed)
Physical Medicine and Rehabilitation Admission H&P    Chief Complaint  Patient presents with   Anoxic brain injury    HPI:  Lisa Ayala is a 56 year old L handed female with history of PSVT, severe MR, moder TR and mild to moderate AI, anxiety d/o, regular marijuana use who was admitted on 02/07/22 via cardiology office with reports of tachycardia and rapid breathing with SOB and found to have SVT/ A flutter. She was received adenosine and started on IV heparin with plans for TEE. She developed hypoxia with saturations in 80's and found to have acute on chronic diastolic heart failure with demand ischemia and transaminitis as well as UDS positive for THC/Cocaine.  She was treated with IV diuresis and started on IV amiodarone 05/17 due to ongoing issues with A flutter. She had worsening of respiratory status prior to TEE therefore procedure aborted, she was intubated and required pressors but deteriorated to PEA arrest requiring CPR/epi X 1 minute with ROSC and was placed on milrinone and lasix drip.   She had issues with agitation requiring precedex. She continued to have SVT with HR in 200 requiring emergent DCCV on 05/19 and 05/20.  She developed fever with leucocytosis, and found to have purulent sputum via ETT. She was started on Zosyn for Group C strep PNA.   Electrolyte abnormalities supplemented and AKI due to ATN/shock is resolving. Dr. Roxan Hockey consulted and following for possible MVR once medically optimized.  She tolerated extubation to 4 L oxygen per [place] on 05/23 and tolerating regular diet. PT/OT evaluations completed and patient noted to have weakness with balance deficits. CIR recommended due to functional decline.    Pt reports not sleeping well- also her heart rate "goes really fast when has stress".  LBM today- and peeing OK- no SOB/DOE.     Review of Systems  Constitutional:  Negative for chills and fever.  HENT:  Negative for hearing loss and tinnitus.   Eyes:   Positive for blurred vision.  Respiratory:  Negative for shortness of breath.   Cardiovascular:  Positive for leg swelling. Negative for chest pain and palpitations.  Gastrointestinal:  Negative for constipation, heartburn and nausea.  Genitourinary:  Negative for dysuria and urgency.  Musculoskeletal:  Negative for back pain and myalgias.  Neurological:  Positive for sensory change (numbness/tingling bilateral hands) and weakness. Negative for dizziness and headaches.  Psychiatric/Behavioral:  Negative for memory loss. The patient does not have insomnia.   All other systems reviewed and are negative.   Past Medical History:  Diagnosis Date   Aortic insufficiency    PSVT (paroxysmal supraventricular tachycardia) (HCC)    Severe mitral regurgitation    a. 08/2021 Echo: EF 70-75%, no rwma, mod dil LV, GrII DD, nl RV fxn, massively dil LA, mild-mod dil RA, severe MR due to lack of central coaptation of MV leaflets. Mild MS. Mod TR. Mild-mod AI.   Tricuspid regurgitation     Past Surgical History:  Procedure Laterality Date   RIGHT/LEFT HEART CATH AND CORONARY ANGIOGRAPHY N/A 02/10/2022   Procedure: RIGHT/LEFT HEART CATH AND CORONARY ANGIOGRAPHY;  Surgeon: Jolaine Artist, MD;  Location: [facility];  Service: Cardiovascular;  Laterality: N/A;   TEE WITHOUT CARDIOVERSION N/A 02/08/2022   Procedure: TRANSESOPHAGEAL ECHOCARDIOGRAM (TEE);  Surgeon: Geralynn Rile, MD;  Location: [facility];  Service: Cardiovascular;  Laterality: N/A;    Family History: Mother alive but family lives in [place] and history unknown.   Social History:  Divorced. Single and lives  alone but daughter here from college. 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 She  reports that she has never smoked. She has never used smokeless tobacco. She reports that she does not currently use alcohol. She reports current drug use. Drug: Marijuana--4-5 blunts per day. .   Allergies: No Known  Allergies   Medications Prior to Admission  Medication Sig Dispense Refill   aspirin EC 81 MG tablet Take 81 mg by mouth daily. Swallow whole.     metoprolol succinate (TOPROL-XL) 50 MG 24 hr tablet TAKE 1 TABLET BY MOUTH DAILY. TAKE WITH OR IMMEDIATELY FOLLOWING A MEAL. 90 tablet 3      Home: Home Living Family/patient expects to be discharged to:: Private residence Living Arrangements: Children Additional Comments: pt reports living with daughter (unable to state daughter's name), unable to answer home set-up or PLOF questions   Functional History: Prior Function Prior Level of Function : Patient poor historian/Family not available Mobility Comments: RN reports daughter stated pt "is usually the life of the party", independent without DME  Functional Status:  Mobility: Bed Mobility Overal bed mobility: Needs Assistance Bed Mobility: Supine to Sit Supine to sit: Supervision, HOB elevated Sit to supine: Min assist General bed mobility comments: received in chair Transfers Overall transfer level: Modified independent Equipment used: None Transfers: Sit to/from Stand Sit to Stand: Modified independent (Device/Increase time) Bed to/from chair/wheelchair/BSC transfer type:: Step pivot Step pivot transfers: Mod assist General transfer comment: from recliner using hands then x5 reps with arms crossed, no LOB Ambulation/Gait Ambulation/Gait assistance: Supervision, Min guard Gait Distance (Feet): 300 Feet Assistive device: None Gait Pattern/deviations: Step-through pattern, Narrow base of support General Gait Details: see DGI tasks; no LOB during balance challenges or gait without AD; min guard initially and with backward walking, progressed to supervision for safety, pt modI in room prior to session (increased time) Gait velocity: Decreased Stairs: Yes Stairs assistance: Modified independent (Device/Increase time) Stair Management: One rail Left, Alternating pattern,  Forwards Number of Stairs: 3 General stair comments: pt using single handrail to descend steps with good eccentric control with reciprocal pattern, no LOB    ADL: ADL Overall ADL's : Needs assistance/impaired Eating/Feeding: NPO Grooming: Wash/dry hands, Wash/dry face, Min guard, Standing Grooming Details (indicate cue type and reason): standing at sink Upper Body Bathing: Moderate assistance Lower Body Bathing: Moderate assistance Upper Body Dressing : Maximal assistance Lower Body Dressing: Maximal assistance Toilet Transfer: Moderate assistance, Stand-pivot, BSC/3in1 Toilet Transfer Details (indicate cue type and reason): to the left Toileting- Clothing Manipulation and Hygiene: Maximal assistance, +2 for safety/equipment, Sit to/from stand Toileting - Clothing Manipulation Details (indicate cue type and reason): OT in front for balance/boost, NT in back performing peri care Functional mobility during ADLs:  (SPT only this session) General ADL Comments: performed grooming standing at sink without seated rest break  Cognition: Cognition Overall Cognitive Status: Within Functional Limits for tasks assessed Orientation Level: Oriented X4 Cognition Arousal/Alertness: Awake/alert Behavior During Therapy: WFL for tasks assessed/performed Overall Cognitive Status: Within Functional Limits for tasks assessed Area of Impairment: Orientation, Attention, Following commands, Safety/judgement, Awareness, Problem solving Orientation Level: Disoriented to, Time, Situation Current Attention Level: Selective Following Commands: Follows one step commands consistently, Follows multi-step commands consistently Safety/Judgement: Decreased awareness of safety, Decreased awareness of deficits Awareness: Anticipatory Problem Solving: Slow processing, Difficulty sequencing, Requires verbal cues General Comments: good awareness of safety, pt reports 2/10 modified RPE post-activity but had reported more  fatigued earlier, pt  able to navigate back to room from hallway/stair ascent without hints/able to recall her room number. Much improved attention and command following but still mildly impulsive (cued to wait 60 seconds for tandem stances but pt stepping apart after ~20 seconds, agrees to continue however when cued).   Blood pressure (!) 156/96, pulse 92, temperature 99 F (37.2 C), temperature source Oral, resp. rate 17, weight 62.9 kg, SpO2 98 %. Physical Exam Vitals and nursing note reviewed. Exam conducted with a chaperone present.  Constitutional:      Appearance: Normal appearance.     Comments: Pt made up/make and hair done- friend at bedside; sitting up in bed; appropriate, interactive, NAD  HENT:     Head: Normocephalic and atraumatic.     Comments: No facial droop    Right Ear: External ear normal.     Left Ear: External ear normal.     Nose: No congestion.     Mouth/Throat:     Mouth: Mucous membranes are moist.     Pharynx: Oropharynx is clear. No oropharyngeal exudate.  Eyes:     General:        Right eye: No discharge.        Left eye: No discharge.     Extraocular Movements: Extraocular movements intact.  Cardiovascular:     Rate and Rhythm: Tachycardia present. Rhythm irregular.     Heart sounds: Murmur heard.     Comments: Severe mitral murmur heard- heart rate ~ 100-110 Pulmonary:     Effort: Pulmonary effort is normal. No respiratory distress.     Breath sounds: Normal breath sounds. No wheezing or rhonchi.  Abdominal:     General: Bowel sounds are normal. There is no distension.     Palpations: Abdomen is soft.     Tenderness: There is no abdominal tenderness.  Musculoskeletal:     Cervical back: Neck supple. No tenderness.     Comments: BLE with min edema--support stockings in place.  5-/5 in all 4 extremities- very slightly weak throughout B/L    Skin:    General: Skin is warm and dry.  Neurological:     Mental Status: She is alert and oriented to  person, place, and time.     Comments: Fair historian Intact to light touch in all 4 extremities  Psychiatric:     Comments: Alternating happy and sad affect    Results for orders placed or performed during the hospital encounter of 02/07/22 (from the past 48 hour(s))  Glucose, capillary     Status: Abnormal   Collection Time: 02/19/22  4:35 PM  Result Value Ref Range   Glucose-Capillary 137 (H) 70 - 99 mg/dL    Comment: Glucose reference range applies only to samples taken after fasting for at least 8 hours.  Glucose, capillary     Status: Abnormal   Collection Time: 02/19/22  8:37 PM  Result Value Ref Range   Glucose-Capillary 122 (H) 70 - 99 mg/dL    Comment: Glucose reference range applies only to samples taken after fasting for at least 8 hours.  Glucose, capillary     Status: Abnormal   Collection Time: 02/20/22 12:15 AM  Result Value Ref Range   Glucose-Capillary 124 (H) 70 - 99 mg/dL    Comment: Glucose reference range applies only to samples taken after fasting for at least 8 hours.  Glucose, capillary     Status: Abnormal   Collection Time: 02/20/22  4:35 AM  Result Value Ref Range  Glucose-Capillary 124 (H) 70 - 99 mg/dL    Comment: Glucose reference range applies only to samples taken after fasting for at least 8 hours.  Magnesium     Status: None   Collection Time: 02/20/22  5:18 AM  Result Value Ref Range   Magnesium 1.9 1.7 - 2.4 mg/dL    Comment: Performed at [facility] [address] [place], [place] [address]  Triglycerides     Status: None   Collection Time: 02/20/22  5:18 AM  Result Value Ref Range   Triglycerides 64 <150 mg/dL    Comment: Performed at [facility] [address] [place], [place] [address]  CBC     Status: Abnormal   Collection Time: 02/20/22  5:18 AM  Result Value Ref Range   WBC 17.5 (H) 4.0 - 10.5 K/uL   RBC 3.64 (L) 3.87 - 5.11 MIL/uL   Hemoglobin 10.4 (L) 12.0 - 15.0 g/dL   HCT 32.6 (L) 36.0 - 46.0 %   MCV 89.6  80.0 - 100.0 fL   MCH 28.6 26.0 - 34.0 pg   MCHC 31.9 30.0 - 36.0 g/dL   RDW 19.2 (H) 11.5 - 15.5 %   Platelets 434 (H) 150 - 400 K/uL   nRBC 0.6 (H) 0.0 - 0.2 %    Comment: Performed at [facility] [address], [place], [place] [id number]  Basic metabolic panel     Status: Abnormal   Collection Time: 02/20/22  5:18 AM  Result Value Ref Range   Sodium 142 135 - 145 mmol/L   Potassium 3.4 (L) 3.5 - 5.1 mmol/L   Chloride 112 (H) 98 - 111 mmol/L   CO2 20 (L) 22 - 32 mmol/L   Glucose, Bld 132 (H) 70 - 99 mg/dL    Comment: Glucose reference range applies only to samples taken after fasting for at least 8 hours.   BUN 15 6 - 20 mg/dL   Creatinine, Ser 1.14 (H) 0.44 - 1.00 mg/dL   Calcium 8.5 (L) 8.9 - 10.3 mg/dL   GFR, Estimated 56 (L) >60 mL/min    Comment: (NOTE) Calculated using the CKD-EPI Creatinine Equation (2021)    Anion gap 10 5 - 15    Comment: Performed at [facility] [address] [place], [place] [address]  Glucose, capillary     Status: Abnormal   Collection Time: 02/20/22  7:24 AM  Result Value Ref Range   Glucose-Capillary 140 (H) 70 - 99 mg/dL    Comment: Glucose reference range applies only to samples taken after fasting for at least 8 hours.  Glucose, capillary     Status: None   Collection Time: 02/20/22 11:33 AM  Result Value Ref Range   Glucose-Capillary 90 70 - 99 mg/dL    Comment: Glucose reference range applies only to samples taken after fasting for at least 8 hours.  Glucose, capillary     Status: None   Collection Time: 02/20/22  4:23 PM  Result Value Ref Range   Glucose-Capillary 99 70 - 99 mg/dL    Comment: Glucose reference range applies only to samples taken after fasting for at least 8 hours.  Glucose, capillary     Status: Abnormal   Collection Time: 02/20/22  9:18 PM  Result Value Ref Range   Glucose-Capillary 150 (H) 70 - 99 mg/dL    Comment: Glucose reference range applies only to samples taken after fasting for at least 8  hours.  Magnesium     Status: None   Collection Time: 02/21/22  6:04 AM  Result Value Ref Range   Magnesium 1.9 1.7 - 2.4 mg/dL    Comment: Performed at [facility] [address] [place], [place] [address]  CBC     Status: Abnormal   Collection Time: 02/21/22  6:04 AM  Result Value Ref Range   WBC 16.5 (H) 4.0 - 10.5 K/uL   RBC 3.82 (L) 3.87 - 5.11 MIL/uL   Hemoglobin 11.1 (L) 12.0 - 15.0 g/dL   HCT 34.1 (L) 36.0 - 46.0 %   MCV 89.3 80.0 - 100.0 fL   MCH 29.1 26.0 - 34.0 pg   MCHC 32.6 30.0 - 36.0 g/dL   RDW 19.7 (H) 11.5 - 15.5 %   Platelets 505 (H) 150 - 400 K/uL   nRBC 0.2 0.0 - 0.2 %    Comment: Performed at [facility] [address] [place], [place] [address]  Comprehensive metabolic panel     Status: Abnormal   Collection Time: 02/21/22  6:04 AM  Result Value Ref Range   Sodium 139 135 - 145 mmol/L   Potassium 4.7 3.5 - 5.1 mmol/L    Comment: NO VISIBLE HEMOLYSIS   Chloride 111 98 - 111 mmol/L   CO2 18 (L) 22 - 32 mmol/L   Glucose, Bld 108 (H) 70 - 99 mg/dL    Comment: Glucose reference range applies only to samples taken after fasting for at least 8 hours.   BUN 15 6 - 20 mg/dL   Creatinine, Ser 1.17 (H) 0.44 - 1.00 mg/dL   Calcium 9.0 8.9 - 10.3 mg/dL   Total Protein 7.1 6.5 - 8.1 g/dL   Albumin 2.7 (L) 3.5 - 5.0 g/dL   AST 31 15 - 41 U/L   ALT 41 0 - 44 U/L   Alkaline Phosphatase 105 38 - 126 U/L   Total Bilirubin 0.7 0.3 - 1.2 mg/dL   GFR, Estimated 54 (L) >60 mL/min    Comment: (NOTE) Calculated using the CKD-EPI Creatinine Equation (2021)    Anion gap 10 5 - 15    Comment: Performed at [facility] [address], [place], [place] [address]  Phosphorus     Status: None   Collection Time: 02/21/22  6:04 AM  Result Value Ref Range   Phosphorus 2.6 2.5 - 4.6 mg/dL    Comment: Performed at [facility] [address] [place], [place] [address]   No results found.    Blood pressure (!) 156/96, pulse 92, temperature 99 F  (37.2 C), temperature source Oral, resp. rate 17, weight 62.9 kg, SpO2 98 %.  Medical Problem List and Plan: 1. Functional deficits secondary to anoxia from PEA arrest  -patient may  shower  -ELOS/Goals: 4-8 days mod I to supervision 2.  Antithrombotics: -DVT/anticoagulation:  Pharmaceutical: Other (comment)--Eliquis  -antiplatelet therapy: N/A 3. Pain Management: Tylenol prn  4. Mood: LCSW to follow for evaluation and support.   -antipsychotic agents: N/A 5. Neuropsych: This patient may be capable of making decisions on her own behalf. 6. Skin/Wound Care: Routine pressure relief measures 7. Fluids/Electrolytes/Nutrition: Strict I/O. Will add HH restrictions.   8. Severe MR: Plans for MVR w/MAZE in the future 9. SVT/A fib with RVR s/p DCCV: Remains in A fib. Transitioned to po amiodarone on 05/29 -- Continue Eliquis 10. Flash pulmonary edema: Has had issues with BLE edema for weeks. 11. Leucocytosis: Has completed  7 day course of Zosyn for treatment for PNA.  --monitor for fevers and other signs of infection. 12. AKI: Improving. Monitor for stability--recheck CMET in am. 13. HTN: Monitor BP TID--continue Entresto and aldactone.   14. Hypomagnesemia: Supplemented and started on Mag Ox 400 mg bid today --Recheck Mg level in am.   15. Hypokalemia: Resolved with intermittent supplementation.  16. Anemia: Resolving.  17. Insomnia- will start trazodone 75 mg QHS and monitor if needs to be titrated   I have personally performed a face to face diagnostic evaluation of this patient and formulated the key components of the plan.  Additionally, I have personally reviewed laboratory data, imaging studies, as well as relevant notes and concur with the physician assistant's documentation above.   The patient's status has not changed from the original H&P.  Any changes in documentation from the acute care chart have been noted above.     Bary Leriche, PA-C 02/21/2022

## 2022-02-22 DIAGNOSIS — G479 Sleep disorder, unspecified: Secondary | ICD-10-CM

## 2022-02-22 DIAGNOSIS — I340 Nonrheumatic mitral (valve) insufficiency: Secondary | ICD-10-CM

## 2022-02-22 DIAGNOSIS — I10 Essential (primary) hypertension: Secondary | ICD-10-CM

## 2022-02-22 LAB — CBC WITH DIFFERENTIAL/PLATELET
Abs Immature Granulocytes: 0.28 10*3/uL — ABNORMAL HIGH (ref 0.00–0.07)
Basophils Absolute: 0.1 10*3/uL (ref 0.0–0.1)
Basophils Relative: 1 %
Eosinophils Absolute: 0.3 10*3/uL (ref 0.0–0.5)
Eosinophils Relative: 3 %
HCT: 33.2 % — ABNORMAL LOW (ref 36.0–46.0)
Hemoglobin: 10.4 g/dL — ABNORMAL LOW (ref 12.0–15.0)
Immature Granulocytes: 2 %
Lymphocytes Relative: 12 %
Lymphs Abs: 1.4 10*3/uL (ref 0.7–4.0)
MCH: 28.5 pg (ref 26.0–34.0)
MCHC: 31.3 g/dL (ref 30.0–36.0)
MCV: 91 fL (ref 80.0–100.0)
Monocytes Absolute: 0.7 10*3/uL (ref 0.1–1.0)
Monocytes Relative: 6 %
Neutro Abs: 9.3 10*3/uL — ABNORMAL HIGH (ref 1.7–7.7)
Neutrophils Relative %: 76 %
Platelets: 560 10*3/uL — ABNORMAL HIGH (ref 150–400)
RBC: 3.65 MIL/uL — ABNORMAL LOW (ref 3.87–5.11)
RDW: 19.4 % — ABNORMAL HIGH (ref 11.5–15.5)
WBC: 12.1 10*3/uL — ABNORMAL HIGH (ref 4.0–10.5)
nRBC: 0 % (ref 0.0–0.2)

## 2022-02-22 LAB — COMPREHENSIVE METABOLIC PANEL
ALT: 33 U/L (ref 0–44)
AST: 21 U/L (ref 15–41)
Albumin: 2.3 g/dL — ABNORMAL LOW (ref 3.5–5.0)
Alkaline Phosphatase: 89 U/L (ref 38–126)
Anion gap: 6 (ref 5–15)
BUN: 13 mg/dL (ref 6–20)
CO2: 21 mmol/L — ABNORMAL LOW (ref 22–32)
Calcium: 8.7 mg/dL — ABNORMAL LOW (ref 8.9–10.3)
Chloride: 111 mmol/L (ref 98–111)
Creatinine, Ser: 1.15 mg/dL — ABNORMAL HIGH (ref 0.44–1.00)
GFR, Estimated: 56 mL/min — ABNORMAL LOW (ref >60)
Glucose, Bld: 117 mg/dL — ABNORMAL HIGH (ref 70–99)
Potassium: 4.1 mmol/L (ref 3.5–5.1)
Sodium: 138 mmol/L (ref 135–145)
Total Bilirubin: 0.7 mg/dL (ref 0.3–1.2)
Total Protein: 6.1 g/dL — ABNORMAL LOW (ref 6.5–8.1)

## 2022-02-22 LAB — GLUCOSE, CAPILLARY: Glucose-Capillary: 99 mg/dL (ref 70–99)

## 2022-02-22 LAB — MAGNESIUM: Magnesium: 1.8 mg/dL (ref 1.7–2.4)

## 2022-02-22 MED ORDER — ADULT MULTIVITAMIN W/MINERALS CH
1.0000 | ORAL_TABLET | Freq: Every day | ORAL | Status: DC
  Administered 2022-02-22 – 2022-02-24 (×3): 1 via ORAL
  Filled 2022-02-22 (×3): qty 1

## 2022-02-22 MED ORDER — ENSURE ENLIVE PO LIQD
237.0000 mL | Freq: Two times a day (BID) | ORAL | Status: DC
  Administered 2022-02-22 – 2022-02-24 (×4): 237 mL via ORAL
  Filled 2022-02-22: qty 237

## 2022-02-22 MED ORDER — MAGNESIUM SULFATE 2 GM/50ML IV SOLN
2.0000 g | Freq: Once | INTRAVENOUS | Status: AC
  Administered 2022-02-22: 2 g via INTRAVENOUS
  Filled 2022-02-22: qty 50

## 2022-02-22 NOTE — Evaluation (Signed)
Speech Language Pathology Assessment and Plan  Patient Details  Name: Lisa Ayala MRN: [id number] Date of Birth: 10/02/1965  SLP Diagnosis: Cognitive Impairments  Rehab Potential: Excellent ELOS: 2-4 days    Today's Date: 02/22/2022 SLP Individual Time: 0731-0827 SLP Individual Time Calculation (min): 83 min   Hospital Problem: Principal Problem:   Anoxic brain injury ([facility]) Active Problems:   Atrial flutter ([facility])   Severe mitral regurgitation  Past Medical History:  Past Medical History:  Diagnosis Date   Aortic insufficiency    PSVT (paroxysmal supraventricular tachycardia) (HCC)    Severe mitral regurgitation    a. 08/2021 Echo: EF 70-75%, no rwma, mod dil LV, GrII DD, nl RV fxn, massively dil LA, mild-mod dil RA, severe MR due to lack of central coaptation of MV leaflets. Mild MS. Mod TR. Mild-mod AI.   Tricuspid regurgitation    Past Surgical History:  Past Surgical History:  Procedure Laterality Date   RIGHT/LEFT HEART CATH AND CORONARY ANGIOGRAPHY N/A 02/10/2022   Procedure: RIGHT/LEFT HEART CATH AND CORONARY ANGIOGRAPHY;  Surgeon: Jolaine Artist, MD;  Location: [facility];  Service: Cardiovascular;  Laterality: N/A;   TEE WITHOUT CARDIOVERSION N/A 02/08/2022   Procedure: TRANSESOPHAGEAL ECHOCARDIOGRAM (TEE);  Surgeon: Geralynn Rile, MD;  Location: [facility];  Service: Cardiovascular;  Laterality: N/A;    Assessment / Plan / Recommendation Clinical Impression Patient is a 56 y.o. female admitted 02/08/22 with aflutter. Plan for TEE/DCCV, pt rapidly declined 5/17 with progressive dyspnea, flash pulmonary edema, and hypoxia requiring intubation; pt briefly suffered asystolic arrest during intubation. Pt with SVT 5/19 requiring emergent cardioversion; recurrent AF with RVR on 5/20 with DCCV attempted x3; spontaneous return to NSR 5/21. 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 ETT 5/17-5/23. AMS noted  5/24; head CT negative for acute abnormality, suspicion for subacute/chronic infarct L cerebellum. PMH includes severe MR, moderate TR, SVT. Patient with delirium and agitation throughout admission that cleared by 5/29. Therapy evaluations complete with recommendations for CIR. Patient admitted 02/21/22.   Skilled treatment session focused on evaluation of cognitive-linguistic functioning. Upon arrival, patient was standing at the sink with the RW without a staff member present. Patient educated on the importance of calling for assistance prior to getting out of be. The patient reported that the NT assisted the patient to the sink but then left her standing independently. Patient ambulated to an office with the RW with supervision. Patient had not received breakfast and SLP provided a yogurt and decaffeinated coffee. Patient was able to alternate attention between self-feeding and participation in evaluation independently.  SLP administered the Cognistat in which the patient scored WFL on all subtests with the exception of 1 point below normal limits for short-term recall. The patient does endorse a mild change in memory since admission. Therefore, skilled SLP intervention is warranted at this time to maximize the patient's recall of functional information, safety awareness and overall functional independence prior to discharge.     Skilled Therapeutic Interventions          Administered a cognitive-linguistic evaluation, pleas see above for details. Patient verbalized understanding and agreement of all information provided and current plan of care.    SLP Assessment  Patient will need skilled [facility] Services during CIR admission    Recommendations  Oral Care Recommendations: Oral care BID Patient destination: Home Follow up Recommendations:  (TBD) Equipment Recommended: None recommended by SLP    SLP Frequency 3  to 5 out of 7 days   SLP Duration  SLP Intensity  SLP  Treatment/Interventions 2-4 days  Minumum of 1-2 x/day, 30 to 90 minutes  Cognitive remediation/compensation;Internal/external aids;Therapeutic Activities;Environmental controls;Cueing hierarchy;Functional tasks;Patient/family education    Pain Pain Assessment Pain Scale: 0-10 Pain Score: 0-No painNo/Denies Pain   SLP Evaluation Cognition Overall Cognitive Status: Impaired/Different from baseline Arousal/Alertness: Awake/alert Orientation Level: Oriented X4 Year: 2023 Month: May Day of Week: Correct Attention: Selective Selective Attention: Appears intact Memory: Impaired Memory Impairment: Decreased short term memory;Decreased recall of new information;Retrieval deficit Awareness: Appears intact Problem Solving: Appears intact Safety/Judgment: Appears intact  Comprehension Auditory Comprehension Overall Auditory Comprehension: Appears within functional limits for tasks assessed Expression Expression Primary Mode of Expression: Verbal Verbal Expression Overall Verbal Expression: Appears within functional limits for tasks assessed Oral Motor Oral Motor/Sensory Function Overall Oral Motor/Sensory Function: Within functional limits Motor Speech Overall Motor Speech: Appears within functional limits for tasks assessed  Care Tool Care Tool Cognition Ability to hear (with hearing aid or hearing appliances if normally used Ability to hear (with hearing aid or hearing appliances if normally used): 0. Adequate - no difficulty in normal conservation, social interaction, listening to TV   Expression of Ideas and Wants Expression of Ideas and Wants: 4. Without difficulty (complex and basic) - expresses complex messages without difficulty and with speech that is clear and easy to understand   Understanding Verbal and Non-Verbal Content Understanding Verbal and Non-Verbal Content: 4. Understands (complex and basic) - clear comprehension without cues or repetitions  Memory/Recall  Ability Memory/Recall Ability : Location of own room;That he or she is in a hospital/hospital unit;Current season;Staff names and faces    Short Term Goals: Week 1: SLP Short Term Goal 1 (Week 1): STGs=LTGs due to ELOS  Refer to Care Plan for Long Term Goals  Recommendations for other services: None   Discharge Criteria: Patient will be discharged from SLP if patient refuses treatment 3 consecutive times without medical reason, if treatment goals not met, if there is a change in medical status, if patient makes no progress towards goals or if patient is discharged from hospital.  The above assessment, treatment plan, treatment alternatives and goals were discussed and mutually agreed upon: by patient  Trini Christiansen 02/22/2022, 11:58 AM

## 2022-02-22 NOTE — Progress Notes (Signed)
Inpatient Rehabilitation  Patient information reviewed and entered into eRehab system by Crystalle Popwell Waldemar Siegel, OTR/L.   Information including medical coding, functional ability and quality indicators will be reviewed and updated through discharge.    

## 2022-02-22 NOTE — Evaluation (Signed)
Physical Therapy Assessment and Plan  Patient Details  Name: Lisa Ayala MRN: [id number] Date of Birth: 14-Jan-1966  PT Diagnosis: Cognitive deficits, Difficulty walking, and Edema Rehab Potential: Excellent ELOS: 3 days   Today's Date: 02/22/2022 PT Individual Time: 0900-1008 PT Individual Time Calculation (min): 53 min    Hospital Problem: Principal Problem:   Anoxic brain injury ([place]) Active Problems:   Atrial flutter ([place])   Severe mitral regurgitation   Past Medical History:  Past Medical History:  Diagnosis Date   Aortic insufficiency    PSVT (paroxysmal supraventricular tachycardia) (HCC)    Severe mitral regurgitation    a. 08/2021 Echo: EF 70-75%, no rwma, mod dil LV, GrII DD, nl RV fxn, massively dil LA, mild-mod dil RA, severe MR due to lack of central coaptation of MV leaflets. Mild MS. Mod TR. Mild-mod AI.   Tricuspid regurgitation    Past Surgical History:  Past Surgical History:  Procedure Laterality Date   RIGHT/LEFT HEART CATH AND CORONARY ANGIOGRAPHY N/A 02/10/2022   Procedure: RIGHT/LEFT HEART CATH AND CORONARY ANGIOGRAPHY;  Surgeon: Jolaine Artist, MD;  Location: [facility];  Service: Cardiovascular;  Laterality: N/A;   TEE WITHOUT CARDIOVERSION N/A 02/08/2022   Procedure: TRANSESOPHAGEAL ECHOCARDIOGRAM (TEE);  Surgeon: Geralynn Rile, MD;  Location: [facility];  Service: Cardiovascular;  Laterality: N/A;    Assessment & Plan Clinical Impression: Patient is a 56 y.o. year old L handed female with history of PSVT, severe MR, moder TR and mild to moderate AI, anxiety d/o, regular marijuana use who was admitted on 02/07/22 via cardiology office with reports of tachycardia and rapid breathing with SOB and found to have SVT/ A flutter. She was received adenosine and started on IV heparin with plans for TEE. She developed hypoxia with saturations in 80's and found to have acute on chronic diastolic heart failure with demand ischemia and  transaminitis as well as UDS positive for THC/Cocaine.  She was treated with IV diuresis and started on IV amiodarone 05/17 due to ongoing issues with A flutter. She had worsening of respiratory status prior to TEE therefore procedure aborted, she was intubated and required pressors but deteriorated to PEA arrest requiring CPR/epi X 1 minute with ROSC and was placed on milrinone and lasix drip.   She had issues with agitation requiring precedex. She continued to have SVT with HR in 200 requiring emergent DCCV on 05/19 and 05/20.  She developed fever with leucocytosis, and found to have purulent sputum via ETT. She was started on Zosyn for Group C strep PNA.    Electrolyte abnormalities supplemented and AKI due to ATN/shock is resolving. Dr. Roxan Hockey consulted and following for possible MVR once medically optimized.  She tolerated extubation to 4 L oxygen per [place] on 05/23 and tolerating regular diet. PT/OT evaluations completed and patient noted to have weakness with balance deficits. CIR recommended due to functional decline.  Patient transferred to CIR on 02/21/2022 .   Patient currently requires supervision with mobility secondary to decreased cardiorespiratoy endurance, decreased memory, and decreased postural control and decreased balance strategies.  Prior to hospitalization, patient was independent  with mobility and lived with Daughter in a House home.  Home access is 1Stairs to enter.  Patient will benefit from skilled PT intervention to maximize safe functional mobility, minimize fall risk, and decrease caregiver burden for planned discharge home with intermittent assist.  Anticipate patient will benefit from follow up OP at discharge.  PT - End of Session Activity Tolerance:  Tolerates 30+ min activity with multiple rests Endurance Deficit: Yes Endurance Deficit Description: requires frequent rest breaks with moderate activity PT Assessment Rehab Potential (ACUTE/IP ONLY): Excellent PT  Barriers to Discharge: Home environment access/layout;Behavior PT Patient demonstrates impairments in the following area(s): Balance;Behavior;Edema;Endurance;Motor;Nutrition;Safety;Skin Integrity PT Transfers Functional Problem(s): Bed Mobility;Bed to Chair;Car;Furniture;Floor PT Locomotion Functional Problem(s): Ambulation;Stairs PT Plan PT Intensity: Minimum of 1-2 x/day ,45 to 90 minutes PT Frequency: 5 out of 7 days PT Duration Estimated Length of Stay: 3 days PT Treatment/Interventions: Ambulation/gait training;Discharge planning;Functional mobility training;Psychosocial support;Therapeutic Activities;Visual/perceptual remediation/compensation;Balance/vestibular training;Disease management/prevention;Neuromuscular re-education;Skin care/wound management;Therapeutic Exercise;UE/LE Strength taining/ROM;Pain management;Splinting/orthotics;DME/adaptive equipment instruction;Cognitive remediation/compensation;Community reintegration;Functional electrical stimulation;Patient/family education;Stair training;UE/LE Coordination activities PT Transfers Anticipated Outcome(s): independent PT Locomotion Anticipated Outcome(s): independent PT Recommendation Recommendations for Other Services: Therapeutic Recreation consult Therapeutic Recreation Interventions: Stress management Follow Up Recommendations: Outpatient PT Patient destination: Home Equipment Recommended: None recommended by PT   PT Evaluation Precautions/Restrictions Precautions Precautions: Fall Restrictions Weight Bearing Restrictions: No Other Position/Activity Restrictions: Patient's daughter cleared for transfers in the room, no showers. Pain Pain Assessment Pain Scale: 0-10 Pain Score: 0-No pain Pain Interference Pain Interference Pain Effect on Sleep: 0. Does not apply - I have not had any pain or hurting in the past 5 days Pain Interference with Therapy Activities: 0. Does not apply - I have not received  rehabilitationtherapy in the past 5 days Pain Interference with Day-to-Day Activities: 1. Rarely or not at all Home Living/Prior [place] Available Help at Discharge: Family Type of Home: House Home Access: Stairs to enter Technical brewer of Steps: 1 Entrance Stairs-Rails: None Home Layout: Two level Alternate Level Stairs-Number of Steps: 13 steps Alternate Level Stairs-Rails: Left  Lives With: Daughter Prior Function Level of Independence: Independent with basic ADLs;Independent with gait;Independent with transfers  Able to Take Stairs?: Yes Driving: Yes Vocation: Full time employment Vocation Requirements: worked at [employer] - History Ability to See in Adequate Light: 0 Adequate Vision - Assessment Eye Alignment: Within Functional Limits Ocular Range of Motion: Within Functional Limits Tracking/Visual Pursuits: Able to track stimulus in all quads without difficulty Saccades: Within functional limits Perception Perception: Within Functional Limits Praxis Praxis: Intact  Cognition Overall Cognitive Status: Impaired/Different from baseline Arousal/Alertness: Awake/alert Orientation Level: Oriented X4 Year: 2023 Month: May Day of Week: Correct Attention: Selective Selective Attention: Appears intact Memory: Impaired Memory Impairment: Decreased short term memory;Decreased recall of new information;Retrieval deficit Awareness: Appears intact Problem Solving: Appears intact Safety/Judgment: Appears intact Sensation Sensation Light Touch: Appears Intact Hot/Cold: Appears Intact Proprioception: Appears Intact Coordination Gross Motor Movements are Fluid and Coordinated: Yes Heel Shin Test: [facility] Motor  Motor Motor: Other (comment) Motor - Skilled Clinical Observations: decreased endurance, generalized deconditioning   Trunk/Postural Assessment  Cervical Assessment Cervical Assessment: Within Functional  Limits Thoracic Assessment Thoracic Assessment: Within Functional Limits Lumbar Assessment Lumbar Assessment: Within Functional Limits Postural Control Postural Control: Deficits on evaluation  Balance Standardized Balance Assessment Standardized Balance Assessment: Berg Balance Test;Functional Gait Assessment Berg Balance Test Sit to Stand: Able to stand without using hands and stabilize independently Standing Unsupported: Able to stand safely 2 minutes Sitting with Back Unsupported but Feet Supported on Floor or Stool: Able to sit safely and securely 2 minutes Stand to Sit: Sits safely with minimal use of hands Transfers: Able to transfer safely, minor use of hands Standing Unsupported with Eyes Closed: Able to stand 10 seconds safely Standing Ubsupported with Feet Together: Able to place feet together independently and stand 1 minute safely From  Standing, Reach Forward with Outstretched Arm: Can reach confidently >25 cm (10") From Standing Position, Pick up Object from Floor: Able to pick up shoe safely and easily From Standing Position, Turn to Look Behind Over each Shoulder: Looks behind from both sides and weight shifts well Turn 360 Degrees: Able to turn 360 degrees safely in 4 seconds or less Standing Unsupported, Alternately Place Feet on Step/Stool: Able to stand independently and safely and complete 8 steps in 20 seconds Standing Unsupported, One Foot in Front: Able to place foot tandem independently and hold 30 seconds Standing on One Leg: Able to lift leg independently and hold > 10 seconds Total Score: 56 Static Sitting Balance Static Sitting - Level of Assistance: 7: Independent Dynamic Sitting Balance Dynamic Sitting - Level of Assistance: 7: Independent Static Standing Balance Static Standing - Level of Assistance: 7: Independent Dynamic Standing Balance Dynamic Standing - Level of Assistance: 7: Independent Functional Gait  Assessment Gait assessed : Yes Gait  Level Surface: Walks 20 ft in less than 7 sec but greater than 5.5 sec, uses assistive device, slower speed, mild gait deviations, or deviates 6-10 in outside of the 12 in walkway width. Change in Gait Speed: Able to change speed, demonstrates mild gait deviations, deviates 6-10 in outside of the 12 in walkway width, or no gait deviations, unable to achieve a major change in velocity, or uses a change in velocity, or uses an assistive device. Gait with Horizontal Head Turns: Performs head turns smoothly with slight change in gait velocity (eg, minor disruption to smooth gait path), deviates 6-10 in outside 12 in walkway width, or uses an assistive device. Gait with Vertical Head Turns: Performs task with slight change in gait velocity (eg, minor disruption to smooth gait path), deviates 6 - 10 in outside 12 in walkway width or uses assistive device Gait and Pivot Turn: Pivot turns safely within 3 sec and stops quickly with no loss of balance. Step Over Obstacle: Is able to step over 2 stacked shoe boxes taped together (9 in total height) without changing gait speed. No evidence of imbalance. Gait with Narrow Base of Support: Ambulates 4-7 steps. Gait with Eyes Closed: Walks 20 ft, uses assistive device, slower speed, mild gait deviations, deviates 6-10 in outside 12 in walkway width. Ambulates 20 ft in less than 9 sec but greater than 7 sec. Ambulating Backwards: Walks 20 ft, uses assistive device, slower speed, mild gait deviations, deviates 6-10 in outside 12 in walkway width. Steps: Alternating feet, must use rail. Total Score: 21 Extremity Assessment  RLE Assessment RLE Assessment: Within Functional Limits Active Range of Motion (AROM) Comments: WFL for all functional mobility General Strength Comments: Grossly 5/5 throughout in sitting LLE Assessment LLE Assessment: Within Functional Limits Active Range of Motion (AROM) Comments: WFL for all functional mobility General Strength Comments:  Grossly 5/5 throughout in sitting  Care Tool Care Tool Bed Mobility Roll left and right activity   Roll left and right assist level: Independent    Sit to lying activity   Sit to lying assist level: Independent    Lying to sitting on side of bed activity   Lying to sitting on side of bed assist level: the ability to move from lying on the back to sitting on the side of the bed with no back support.: Independent     Care Tool Transfers Sit to stand transfer   Sit to stand assist level: Independent    Chair/bed transfer   Chair/bed transfer assist  level: Independent     Toilet transfer   Assist Level: Ship broker transfer assist level: Supervision/Verbal cueing      Care Tool Locomotion Ambulation   Assist level: Supervision/Verbal cueing Assistive device: No Device Max distance: 930 ft  Walk 10 feet activity   Assist level: Supervision/Verbal cueing Assistive device: No Device   Walk 50 feet with 2 turns activity   Assist level: Supervision/Verbal cueing Assistive device: No Device  Walk 150 feet activity   Assist level: Supervision/Verbal cueing Assistive device: No Device  Walk 10 feet on uneven surfaces activity   Assist level: Supervision/Verbal cueing    Stairs   Assist level: Supervision/Verbal cueing Stairs assistive device: 1 hand rail Max number of stairs: 16  Walk up/down 1 step activity   Walk up/down 1 step (curb) assist level: Supervision/Verbal cueing Walk up/down 1 step or curb assistive device: 1 hand rail  Walk up/down 4 steps activity   Walk up/down 4 steps assist level: Supervision/Verbal cueing Walk up/down 4 steps assistive device: 1 hand rail  Walk up/down 12 steps activity   Walk up/down 12 steps assist level: Supervision/Verbal cueing Walk up/down 12 steps assistive device: 1 hand rail  Pick up small objects from floor   Pick up small object from the floor assist level: Independent    Wheelchair Is the patient  using a wheelchair?: No          Wheel 50 feet with 2 turns activity      Wheel 150 feet activity        Refer to Care Plan for Long Term Goals  SHORT TERM GOAL WEEK 1 PT Short Term Goal 1 (Week 1): STG=LTG due to ELOS.  Recommendations for other services: Therapeutic Recreation  Stress management  Skilled Therapeutic Intervention Evaluation completed (see details above and below) with education on PT POC and goals and individual treatment initiated with focus on functional mobility/transfers, LE strength, dynamic standing balance/coordination, ambulation, stair navigation, simulated car transfers, and improved endurance with activity.  Patient in bed with her daughter in the room upon PT arrival. Patient alert and agreeable to PT session. Patient denied pain during session.  Vitals: HR 85-90 and SPO2 >98% with all mobility, patient denied chest pain, heart palpitations, or symptoms (dizziness, light headedness); does require seated rest breaks with moderate activity due to fatigue and decreased activity tolerance  Therapeutic Activity: Bed Mobility: Patient performed rolling R/L and supine to/from sit independently in a flat bed without use of bed rails.  Transfers: Patient performed sit to/from stand independently throughout session without an AD.  Patient performed a simulated sedan height car transfer with supervision using step-in technique without LOB.   Gait Training:  Patient ambulated throughout the unit without an AD with supervision with intermittent seated rest breaks. Ambulated with decreased gait speed, trunk rotation, and arm swing. Patient able to locate her room on return from therapy gym without cues.  Patient ascended/descended 16x6" steps using L rail to simulate second level access at home with supervision. Performed reciprocal gait pattern throughout and maintained slow and steady pace without need for a rest break. HR 92 after. Patient ambulated up/down a ramp,  over 10 feet of mulch (unlevel surface), and up/down a curb to simulate community ambulation over unlevel surfaces with supervision without an AD.  6 Min Walk Test:  Instructed patient to ambulate as quickly and as safely as possible for 6 minutes using LRAD. Patient was allowed to  take standing rest breaks without stopping the test, but if the patient required a sitting rest break the clock would be stopped and the test would be over.  Results: 930 feet (required sitting rest break at 4:00) (283.5 meters, Avg speed 1.2 m/s) with supervision. Results indicate that the patient has reduced endurance with ambulation compared to age matched norms.  Age Matched Norms: 58-69 yo F: 538 meters MDC: 58.21 meters (190.98 feet) or 50 meters (ANPTA Core Set of Outcome Measures for Adults with Neurologic Conditions, 2018)  Neuromuscular Re-ed: Patient demonstrated reduced fall risk noted by score of 56/56 on the Berg Balance Scale, see details above under Balance.  <45/56 = fall risk, <42/56 = predictive of recurrent falls, <40/56 = 100% fall risk  >41 = independent, 21-40 = assistive device, 0-20 = wheelchair level  MDC 6.9 (4 pts 45-56, 5 pts 35-44, 7 pts 25-34) (ANPTA Core Set of Outcome Measures for Adults with Neurologic Conditions, 2018)  Five times Sit to Stand Test (FTSS) Method: Use a straight back chair with a solid seat that is 17-18" high. Ask participant to sit on the chair with arms folded across their chest.   Instructions: "Stand up and sit down as quickly as possible 5 times, keeping your arms folded across your chest."   Measurement: Stop timing when the participant touches the chair in sitting the 5th time. TIME: 13 sec Cut off scores indicative of increased fall risk: >12 sec CVA, >16 sec PD, >13 sec vestibular (ANPTA Core Set of Outcome Measures for Adults with Neurologic Conditions, 2018)  Patient demonstrates increased fall risk as noted by score of 23/30 on  Functional Gait  Assessment.   <22/30 = predictive of falls, <20/30 = fall in 6 months, <18/30 = predictive of falls in PD MCID: 5 points stroke population, 4 points geriatric population (ANPTA Core Set of Outcome Measures for Adults with Neurologic Conditions, 2018)  Instructed pt and her daughter in results of PT evaluation as detailed above, PT POC, rehab potential, rehab goals, and discharge recommendations. Additionally discussed CIR's policies regarding fall safety and use of chair alarm and/or quick release belt. Pt verbalized understanding and in agreement.   Patient sitting on the bed with her daughter in the room at end of session with breaks locked, no alarm set, and all needs within reach. Cleared patient's daughter for bathroom transfers and ambulation in the room with patient. Educated on use on non-skid socks and letting nursing staff know when her daughter is leaving to set appropriate alarms.  Discharge Criteria: Patient will be discharged from PT if patient refuses treatment 3 consecutive times without medical reason, if treatment goals not met, if there is a change in medical status, if patient makes no progress towards goals or if patient is discharged from hospital.  The above assessment, treatment plan, treatment alternatives and goals were discussed and mutually agreed upon: by patient and by family  Doreene Burke PT, DPT  02/22/2022, 10:13 AM

## 2022-02-22 NOTE — Discharge Instructions (Signed)
Inpatient Rehab Discharge Instructions  Lisa Ayala Discharge date and time:  02/24/22  Activities/Precautions/ Functional Status: Activity: no lifting, driving, or strenuous exercise till cleared by MD Diet: cardiac diet Wound Care: none needed   Functional status:  ___ No restrictions     ___ Walk up steps independently ___ 24/7 supervision/assistance   ___ Walk up steps with assistance _X__ Intermittent supervision/assistance  __X_ Bathe/dress independently ___ Walk with walker     ___ Bathe/dress with assistance ___ Walk Independently    ___ Shower independently ___ Walk with assistance    ___ Shower with assistance _X__ No alcohol     ___ Return to work/school ________   COMMUNITY REFERRALS UPON DISCHARGE:      Outpatient: PLEASE BE SURE TO SPEAK WITH CARDIOLOGY ABOUT SCHEDULING CARDIAC REHAB.  Medical Equipment/Items Ordered:shower chair                                                 Agency/Supplier:[employer] (charity) [phone]  GENERAL COMMUNITY RESOURCES FOR PATIENT/FAMILY: Please be sure to make your appointment for hospital follow-up on June 9 at 9:40am with Angus Seller, NP at [facility] Patient [facility].   [name], NP Friday Mar 03, 2022 9:40 AM Please arrive 15 minutes prior to your appointment. [facility] Patient [facility] [name]  [place] [address] [phone]     Special Instructions:    My questions have been answered and I understand these instructions. I will adhere to these goals and the provided educational materials after my discharge from the hospital.  Patient/Caregiver Signature _______________________________ Date __________  Clinician Signature _______________________________________ Date __________  Please bring this form and your medication list with you to all your follow-up doctor's appointments.

## 2022-02-22 NOTE — Progress Notes (Signed)
Occupational Therapy Assessment and Plan  Patient Details  Name: Lisa Ayala MRN: [id number] Date of Birth: 10-23-1965  OT Diagnosis: cognitive deficits and muscle weakness (generalized) Rehab Potential:   ELOS: 3 days   Today's Date: 02/22/2022 OT Individual Time:1300-1400 73 min  Hospital Problem: Principal Problem:   Anoxic brain injury ([place]) Active Problems:   Atrial flutter ([place])   Severe mitral regurgitation   Past Medical History:  Past Medical History:  Diagnosis Date   Aortic insufficiency    PSVT (paroxysmal supraventricular tachycardia) (HCC)    Severe mitral regurgitation    a. 08/2021 Echo: EF 70-75%, no rwma, mod dil LV, GrII DD, nl RV fxn, massively dil LA, mild-mod dil RA, severe MR due to lack of central coaptation of MV leaflets. Mild MS. Mod TR. Mild-mod AI.   Tricuspid regurgitation    Past Surgical History:  Past Surgical History:  Procedure Laterality Date   RIGHT/LEFT HEART CATH AND CORONARY ANGIOGRAPHY N/A 02/10/2022   Procedure: RIGHT/LEFT HEART CATH AND CORONARY ANGIOGRAPHY;  Surgeon: Jolaine Artist, MD;  Location: [facility];  Service: Cardiovascular;  Laterality: N/A;   TEE WITHOUT CARDIOVERSION N/A 02/08/2022   Procedure: TRANSESOPHAGEAL ECHOCARDIOGRAM (TEE);  Surgeon: Geralynn Rile, MD;  Location: [facility];  Service: Cardiovascular;  Laterality: N/A;    Assessment & Plan Clinical Impression: Lisa Ayala. Lisa Ayala is a 56 year old L handed female with history of PSVT, severe MR, moder TR and mild to moderate AI, anxiety d/o, regular marijuana use who was admitted on 02/07/22 via cardiology office with reports of tachycardia and rapid breathing with SOB and found to have SVT/ A flutter. She was received adenosine and started on IV heparin with plans for TEE. She developed hypoxia with saturations in 80's and found to have acute on chronic diastolic heart failure with demand ischemia and transaminitis as well as UDS positive for  THC/Cocaine.  She was treated with IV diuresis and started on IV amiodarone 05/17 due to ongoing issues with A flutter. She had worsening of respiratory status prior to TEE therefore procedure aborted, she was intubated and required pressors but deteriorated to PEA arrest requiring CPR/epi X 1 minute with ROSC and was placed on milrinone and lasix drip.   She had issues with agitation requiring precedex. She continued to have SVT with HR in 200 requiring emergent DCCV on 05/19 and 05/20.  She developed fever with leucocytosis, and found to have purulent sputum via ETT. She was started on Zosyn for Group C strep PNA.    Electrolyte abnormalities supplemented and AKI due to ATN/shock is resolving. Dr. Roxan Hockey consulted and following for possible MVR once medically optimized.  She tolerated extubation to 4 L oxygen per [place] on 05/23 and tolerating regular diet. PT/OT evaluations completed and patient noted to have weakness with balance deficits. CIR recommended due to functional decline.     Patient currently requires supervision with basic self-care skills and IADL secondary to muscle weakness, decreased cardiorespiratoy endurance, decreased memory, and decreased balance strategies.  Prior to hospitalization, patient could complete BADL/IADL with independent .  Patient will benefit from skilled intervention to increase independence with basic self-care skills and increase level of independence with iADL prior to discharge home with care partner.  Anticipate patient will require intermittent supervision and no further OT follow recommended.  OT - End of Session Activity Tolerance: Tolerates 30+ min activity with multiple rests Endurance Deficit: Yes Endurance Deficit Description: requires frequent rest breaks with moderate activity OT  Assessment OT Patient demonstrates impairments in the following area(s): Balance;Endurance;Safety OT Basic ADL's Functional Problem(s): Bathing;Dressing OT Advanced  ADL's Functional Problem(s): Simple Meal Preparation;Laundry;Light Housekeeping OT Transfers Functional Problem(s): Tub/Shower OT Additional Impairment(s): None OT Plan OT Intensity: Minimum of 1-2 x/day, 45 to 90 minutes OT Frequency: 5 out of 7 days OT Duration/Estimated Length of Stay: 3 days OT Treatment/Interventions: Balance/vestibular training;Patient/family education;DME/adaptive equipment instruction;Therapeutic Activities;Therapeutic Exercise;Functional mobility training;UE/LE Strength taining/ROM;Self Care/advanced ADL retraining;Discharge planning OT Self Feeding Anticipated Outcome(s): no goal OT Basic Self-Care Anticipated Outcome(s): independent OT Toileting Anticipated Outcome(s): independent OT Bathroom Transfers Anticipated Outcome(s): independent OT Recommendation Patient destination: Home Follow Up Recommendations: None Equipment Recommended: Tub/shower seat   OT Evaluation Precautions/Restrictions  Precautions Precautions: Fall Restrictions Weight Bearing Restrictions: No Other Position/Activity Restrictions: Patient's daughter cleared for transfers in the room, no showers. General Chart Reviewed: Yes Family/Caregiver Present: Yes Vital Signs   Pain   Home Living/Prior Functioning Home Living Family/patient expects to be discharged to:: Private residence Living Arrangements: Children Available Help at Discharge: Family Type of Home: House Home Access: Stairs to enter Technical brewer of Steps: 1 Entrance Stairs-Rails: None Home Layout: Two level Alternate Level Stairs-Number of Steps: 13 steps Alternate Level Stairs-Rails: Left  Lives With: Daughter IADL History Homemaking Responsibilities: Yes Meal Prep Responsibility: Secondary Laundry Responsibility: Secondary Cleaning Responsibility: Secondary Bill Paying/Finance Responsibility: Secondary Shopping Responsibility: Secondary Child Care Responsibility: Secondary Prior Function Level of  Independence: Independent with basic ADLs, Independent with gait, Independent with transfers  Able to Take Stairs?: Yes Driving: Yes Vocation: Full time employment Vocation Requirements: worked at [employer] Vision/History: 1 Wears glasses Ability to See in Adequate Light: 0 Adequate Patient Visual Report: No change from baseline Vision Assessment?: Yes Eye Alignment: Within Functional Limits Ocular Range of Motion: Within Functional Limits Tracking/Visual Pursuits: Able to track stimulus in all quads without difficulty Saccades: Within functional limits Perception  Perception: Within Functional Limits Praxis Praxis: Intact Cognition Cognition Overall Cognitive Status: Impaired/Different from baseline Arousal/Alertness: Awake/alert Memory: Impaired Memory Impairment: Decreased short term memory;Decreased recall of new information;Retrieval deficit Safety/Judgment: Appears intact Brief Interview for Mental Status (BIMS) Repetition of Three Words (First Attempt): 3 Temporal Orientation: Year: Correct Temporal Orientation: Month: Accurate within 5 days Temporal Orientation: Day: Correct Recall: "Sock": Yes, no cue required Recall: "Blue": Yes, no cue required Recall: "Bed": Yes, no cue required BIMS Summary Score: 15 Sensation Sensation Light Touch: Appears Intact Hot/Cold: Appears Intact Proprioception: Appears Intact Coordination Gross Motor Movements are Fluid and Coordinated: Yes Fine Motor Movements are Fluid and Coordinated: Yes Motor  Motor Motor - Skilled Clinical Observations: decreased endurance, generalized deconditioning  Trunk/Postural Assessment  Cervical Assessment Cervical Assessment: Within Functional Limits Thoracic Assessment Thoracic Assessment: Within Functional Limits Lumbar Assessment Lumbar Assessment: Within Functional Limits Postural Control Postural Control: Deficits on evaluation  Balance Balance Balance Assessed:  Yes Dynamic Sitting Balance Dynamic Sitting - Level of Assistance: 7: Independent Static Standing Balance Static Standing - Level of Assistance: 7: Independent Dynamic Standing Balance Dynamic Standing - Level of Assistance: 5: Stand by assistance Extremity/Trunk Assessment RUE Assessment RUE Assessment: Within Functional Limits LUE Assessment LUE Assessment: Within Functional Limits  Care Tool Care Tool Self Care Eating        Oral Care    Oral Care Assist Level: Independent    Bathing   Body parts bathed by patient: Right arm;Left arm;Chest;Abdomen;Front perineal area;Buttocks;Right upper leg;Left upper leg;Right lower leg;Left lower leg;Face     Assist Level: Supervision/Verbal cueing    Upper  Body Dressing(including orthotics)   What is the patient wearing?: Pull over shirt   Assist Level: Set up assist    Lower Body Dressing (excluding footwear)   What is the patient wearing?: Underwear/pull up;Pants Assist for lower body dressing: Set up assist    Putting on/Taking off footwear   What is the patient wearing?: Non-skid slipper socks Assist for footwear: Set up assist       Care Tool Toileting Toileting activity   Assist for toileting: Set up assist     Care Tool Bed Mobility Roll left and right activity        Sit to lying activity        Lying to sitting on side of bed activity         Care Tool Transfers Sit to stand transfer        Chair/bed transfer         Toilet transfer   Assist Level: Set up assist     Care Tool Cognition  Expression of Ideas and Wants    Understanding Verbal and Non-Verbal Content     Memory/Recall Ability     Refer to Care Plan for Long Term Goals  SHORT TERM GOAL WEEK 1 OT Short Term Goal 1 (Week 1): STG=LTG d/t ELOS  Recommendations for other services: None    Skilled Therapeutic Intervention 1:1. Pt received in room with daughter supervising mobilty. Pt agreeable to showering at tub shower level and  requires A to gather all items d/t amount. Pt completes tub shower transfer with supervision and VC for not using grab bars as pt does not have at home. Only cue pt needed throughout shower was to sit for washing feet with use of shower seat. Pt able to dress with set up and no safety issues. Pt reports wanting to be able to cook, clean and do laundry when going home. She was working full time prior. Pt verbalizes biggest percieved deficits are decreased strength and endurance. Pt completes Trail making B 2 min 11 sec and memory on BITS for up to 8 words. Exited session with pt seated in recliner and call light in reach  ADL ADL Eating: Independent Grooming: Independent Upper Body Bathing: Setup Where Assessed-Upper Body Bathing: Shower Lower Body Bathing: Supervision/safety Where Assessed-Lower Body Bathing: Shower Upper Body Dressing: Setup Where Assessed-Upper Body Dressing: Chair Lower Body Dressing: Setup Where Assessed-Lower Body Dressing: Chair Toileting: Setup Toilet Transfer: Distant supervision Toilet Transfer Method: Ambulating Tub/Shower Transfer: Close supervison Tub/Shower Transfer Method: Ambulating Mobility  Transfers Sit to Stand: Independent Stand to Sit: Independent   Discharge Criteria: Patient will be discharged from OT if patient refuses treatment 3 consecutive times without medical reason, if treatment goals not met, if there is a change in medical status, if patient makes no progress towards goals or if patient is discharged from hospital.  The above assessment, treatment plan, treatment alternatives and goals were discussed and mutually agreed upon: by patient  Tonny Branch 02/22/2022, 7:23 PM

## 2022-02-22 NOTE — Plan of Care (Signed)
  Problem: RH Problem Solving Goal: LTG Patient will demonstrate problem solving for (SLP) Description: LTG:  Patient will demonstrate problem solving for basic/complex daily situations with cues  (SLP) Flowsheets (Taken 02/22/2022 0911) LTG: Patient will demonstrate problem solving for (SLP): Complex daily situations LTG Patient will demonstrate problem solving for: Modified Independent   Problem: RH Memory Goal: LTG Patient will demonstrate ability for day to day (SLP) Description: LTG:   Patient will demonstrate ability for day to day recall/carryover during cognitive/linguistic activities with assist  (SLP) Flowsheets (Taken 02/22/2022 0911) LTG: Patient will demonstrate ability for day to day recall: New information LTG: Patient will demonstrate ability for day to day recall/carryover during cognitive/linguistic activities with assist (SLP): Modified Independent Goal: LTG Patient will use memory compensatory aids to (SLP) Description: LTG:  Patient will use memory compensatory aids to recall biographical/new, daily complex information with cues (SLP) Flowsheets (Taken 02/22/2022 0911) LTG: Patient will use memory compensatory aids to (SLP): Modified Independent

## 2022-02-22 NOTE — Progress Notes (Signed)
Initial Nutrition Assessment  DOCUMENTATION CODES:   Non-severe (moderate) malnutrition in context of chronic illness  INTERVENTION:  - Liberalize diet from a heart healthy to a regular diet to provide widest variety of menu options to enhance nutritional adequacy  - Ensure Enlive po BID, each supplement provides 350 kcal and 20 grams of protein.  - MVI with minerals daily  NUTRITION DIAGNOSIS:   Moderate Malnutrition related to chronic illness as evidenced by mild fat depletion, mild muscle depletion.  GOAL:   Patient will meet greater than or equal to 90% of their needs  MONITOR:   PO intake, Supplement acceptance, Diet advancement, Labs, Weight trends, I & O's  REASON FOR ASSESSMENT:   Consult Assessment of nutrition requirement/status, Diet education  ASSESSMENT:   Pt admitted to CIR after hospital admission d/t anoxic brain injury. PMH significant for PSVT, severe MR, aortic insufficiency, anxiety d/o, regular marijuana use.  Pt was previously receiving enteral nutrition via [facility] which was discontinued 5/29.  Pt states that she was eating poorly PTA. Her dietary recall includes a butterfinger and chips. Pt endorses eating a vegetarian diet for around the past 5 years. During admission she has been trying to eat meat for the protein but is only taking bites. She endorses some difficulty chewing but states that this is d/t missing teeth. She did not want a modified diet as she enjoys having a wide variety of menu options. Yesterday she reports having eaten about 50% of lunch and dinner and 100% of breakfast today. She also reports getting popcorn from the vending machine as she needed something salty. Pt is familiar with Ensure and is agreeable to receive them during admission to enhance nutritional adequacy.   Meal completion: 05/30: 75%- dinner  Pt states that she usual wt is known to be 150 lbs and endorses wt gain PTA which she attributed to having fluid retention.  Current wt noted to be 64.7 kg.   Pt would benefit from a liberalized diet as she is malnourished and continues with increased nutritional needs.   Medications: peridex, colace, mag-ox, protonix, miralax   Labs: Cr 1.15  I/O's: +236ml since admission  NUTRITION - FOCUSED PHYSICAL EXAM:  Flowsheet Row Most Recent Value  Orbital Region Mild depletion  Upper Arm Region Mild depletion  Thoracic and Lumbar Region No depletion  Buccal Region Mild depletion  Temple Region Moderate depletion  Clavicle Bone Region Moderate depletion  Clavicle and Acromion Bone Region Mild depletion  Scapular Bone Region Moderate depletion  Dorsal Hand No depletion  Patellar Region Mild depletion  Anterior Thigh Region Mild depletion  Posterior Calf Region No depletion  Edema (RD Assessment) None  Hair Reviewed  Eyes Reviewed  Mouth Other (Comment)  [missing teeth]  Skin Reviewed  Nails Reviewed       Diet Order:   Diet Order             Diet Heart Room service appropriate? Yes; Fluid consistency: Thin  Diet effective now                   EDUCATION NEEDS:   Education needs have been addressed  Skin:  Skin Assessment: Reviewed RN Assessment  Last BM:  5/29  Height:   Ht Readings from Last 1 Encounters:  02/21/22 5\' 7"  (1.702 m)    Weight:   Wt Readings from Last 1 Encounters:  02/22/22 64.7 kg    Ideal Body Weight:     BMI:  Body mass index is  22.34 kg/m.  Estimated Nutritional Needs:   Kcal:  1750-1950  Protein:  85-100g  Fluid:  >/=1.7L  Drusilla Kanner, RDN, LDN Clinical Nutrition

## 2022-02-22 NOTE — Progress Notes (Addendum)
Patient ID: Lisa Ayala, female   DOB: 29-Jun-1966, 56 y.o.   MRN: [id number]  SW made efforts to meet with pt but pt in OT session. SW will continue to make efforts to complete assessment.   Pt is setup for [facility] medication assistance program.   SW called [facility] [phone]). Hospital follow-up appointment scheduled with Dr. Laural Benes- Thursday, June 29 at 9:30am.   Cecile Sheerer, MSW, LCSWA Office: [phone] Cell: [phone] Fax: [phone]

## 2022-02-22 NOTE — Progress Notes (Signed)
Consult received. Patient decline PIV placement at this time. Says "i'm taking a shower first." RN notified and instructed to place new consult when patient is back in bed and ready for PIV placement. VU. Fran Lowes, RN VAST

## 2022-02-22 NOTE — Progress Notes (Signed)
PROGRESS NOTE   Subjective/Complaints: Pt says that she slept for about 4 hours then woke up. Drank some coffee which helped her relax a bit. Up standing at sink unattended until SLP came to room.   ROS: Patient denies fever, rash, sore throat, blurred vision, dizziness, nausea, vomiting, diarrhea, cough, shortness of breath or chest pain, joint or back/neck pain, headache, or mood change, no palpitations   Objective:   No results found. Recent Labs    02/21/22 0604 02/22/22 0507  WBC 16.5* 12.1*  HGB 11.1* 10.4*  HCT 34.1* 33.2*  PLT 505* 560*   Recent Labs    02/21/22 1549 02/22/22 0507  NA 137 138  K 4.8 4.1  CL 107 111  CO2 19* 21*  GLUCOSE 148* 117*  BUN 14 13  CREATININE 1.15* 1.15*  CALCIUM 9.1 8.7*    Intake/Output Summary (Last 24 hours) at 02/22/2022 0819 Last data filed at 02/21/2022 1729 Gross per 24 hour  Intake 254 ml  Output --  Net 254 ml        Physical Exam: Vital Signs Blood pressure 138/80, pulse 86, temperature 98.6 F (37 C), resp. rate 18, height 5\' 7"  (1.702 m), weight 64.7 kg, SpO2 96 %.  General: Alert and oriented x 3, No apparent distress HEENT: Head is normocephalic, atraumatic, PERRLA, EOMI, sclera anicteric, oral mucosa pink and moist, dentition intact, ext ear canals clear,  Neck: Supple without JVD or lymphadenopathy Heart: Reg rate and rhythm. +murmur.  Chest: CTA bilaterally without wheezes, rales, or rhonchi; no distress Abdomen: Soft, non-tender, non-distended, bowel sounds positive. Extremities: No clubbing, cyanosis, or edema. Pulses are 2+ Psych: Pt's affect is appropriate. Pt is cooperative Skin: Clean and intact without signs of breakdown Neuro:  pt alert, oriented to self, place. Normal language. Follows basic commands. Fair insight and awareness. Motor seems to be at least 4/5 in all 4limbs. Good standing balance.  Musculoskeletal: no pain with ROM. No edema.      Assessment/Plan: 1. Functional deficits which require 3+ hours per day of interdisciplinary therapy in a comprehensive inpatient rehab setting. Physiatrist is providing close team supervision and 24 hour management of active medical problems listed below. Physiatrist and rehab team continue to assess barriers to discharge/monitor patient progress toward functional and medical goals  Care Tool:  Bathing              Bathing assist       Upper Body Dressing/Undressing Upper body dressing   What is the patient wearing?: Dress    Upper body assist Assist Level: Independent    Lower Body Dressing/Undressing Lower body dressing      What is the patient wearing?:  (pt wearing own clothing.)     Lower body assist Assist for lower body dressing: Independent     Toileting Toileting    Toileting assist Assist for toileting: Contact Guard/Touching assist     Transfers Chair/bed transfer  Transfers assist     Chair/bed transfer assist level: Contact Guard/Touching assist     Locomotion Ambulation   Ambulation assist              Walk 10 feet activity  Assist           Walk 50 feet activity   Assist           Walk 150 feet activity   Assist           Walk 10 feet on uneven surface  activity   Assist           Wheelchair     Assist               Wheelchair 50 feet with 2 turns activity    Assist            Wheelchair 150 feet activity     Assist          Blood pressure 138/80, pulse 86, temperature 98.6 F (37 C), resp. rate 18, height 5\' 7"  (1.702 m), weight 64.7 kg, SpO2 96 %.  Medical Problem List and Plan: 1. Functional deficits secondary to anoxia from PEA arrest             -patient may  shower             -ELOS/Goals: 4-8 days mod I to supervision 2.  Antithrombotics: -DVT/anticoagulation:  Pharmaceutical: Other (comment)--Eliquis             -antiplatelet therapy: N/A 3. Pain  Management: Tylenol prn  4. Mood/sleep:               -antipsychotic agents: N/A  -trazodone seemed helpful somewhat for sleep---continue 75mg  dose 5. Neuropsych: This patient may be capable of making decisions on her own behalf. 6. Skin/Wound Care: Routine pressure relief measures 7. Fluids/Electrolytes/Nutrition: Strict I/O. HH diet.   8. Severe MR: Plans for MVR w/MAZE in the future 9. SVT/A fib with RVR s/p DCCV: Remains in A fib. Transitioned to po amiodarone on 05/29 -- Continue Eliquis -no palpitations/sx reported since admit 10. Flash pulmonary edema: Has had issues with BLE edema for weeks. 11. Leucocytosis: Has completed 7 day course of Zosyn for treatment for PNA.             --monitor for fevers and other signs of infection.  -wbc's down to 12k today 12. AKI: Improving.  5/31 BUN/Cr stable today 13. HTN: Monitor BP TID--continue Entresto and aldactone.   14. Hypomagnesemia: Supplemented and started on Mag Ox 400 mg bid today --  Mg level  1.8 today.   15. Hypokalemia: Resolved with intermittent supplementation.   -currently 4.1--monitor 16. Anemia: in 10-11 range--consider rechecking once prior to dc      LOS: 1 days A FACE TO FACE EVALUATION WAS PERFORMED  Meredith Staggers 02/22/2022, 8:19 AM

## 2022-02-23 DIAGNOSIS — I340 Nonrheumatic mitral (valve) insufficiency: Secondary | ICD-10-CM

## 2022-02-23 DIAGNOSIS — I10 Essential (primary) hypertension: Secondary | ICD-10-CM

## 2022-02-23 DIAGNOSIS — G479 Sleep disorder, unspecified: Secondary | ICD-10-CM

## 2022-02-23 MED ORDER — TRAZODONE HCL 50 MG PO TABS
100.0000 mg | ORAL_TABLET | Freq: Every day | ORAL | Status: DC
  Administered 2022-02-23: 100 mg via ORAL
  Filled 2022-02-23: qty 2

## 2022-02-23 NOTE — Progress Notes (Signed)
Pt having issues with sleeping.  States she is only sleeping at least 15 minutes in an hour.

## 2022-02-23 NOTE — Progress Notes (Signed)
Occupational Therapy Session Note  Patient Details  Name: Lisa Ayala MRN: [id number] Date of Birth: Oct 16, 1965  Today's Date: 02/23/2022 OT Individual Time: 0900-1000 OT Individual Time Calculation (min): 60 min   {CHL IP REHAB OT TIME CALCULATIONS:304400400}  Short Term Goals: {OT YJE:5631497}  Skilled Therapeutic Interventions/Progress Updates:    Session 1: pt received in recliner agreeable to OT reporting already washed up at sink and dressed. Pt completes all mobility except for obstacle course with MOD I- Obstacle cource needing CGA with B 7# ankle weights and close supervision for B and unilateral 7# dumbbell farmers carry.  Pt completes IADL activities such as laundry, vaccuming, making a bed, and simulated cooking with seated rest break between each with no A but generalized deconditioning. And overall MOD I with edu re energy consrvation strategies  Pt completes kitchen search into various height cabinets/appliaces in prep for IADL retraining from *** level with *** cuing for safety/positioning throughout environment. Pt uses *** AE to improve reach/safety and transportation of items with education on energy conservation techniques throughout activity  Pt completes kitchen safety assessment searching/remedying for 5 unsafe situations in kitchen may encounter (knife on edge of counter, cup on floor, water running in sink, cabinet door open, and burner left on).   Pt able to identify *** /5 situations with *** cuing. Activity completed for safety awareness and kitchen navigation. Pt education on reason each scenario is unsafe/risks.     Education/resources provided on meditation for sleep and energy conservation via written handout Meditation Resources: MOST IMPORTANT- you cant do it right! You are in charge of your comfort and positioning.   Love Your Brain- Scientist, forensic.com/meditation/#library   Insight Timer App Free meditations  Free  white noise/brown noise playlists Has a sleep feature to listen to a meditation prior to bed time without needing to worry to turn off your phone Yoga nidra Yoga "sleep" Explores different levels of consciousness that lets your dip into a meditative space and REST! Often longer meditations 20 min-hour   Session 2:  Therapy Documentation Precautions:  Precautions Precautions: Fall Restrictions Weight Bearing Restrictions: No Other Position/Activity Restrictions: Patient's daughter cleared for transfers in the room, no showers. General:   Vital Signs: Therapy Vitals Temp: 98.5 F (36.9 C) Temp Source: Oral Pulse Rate: 86 Resp: 18 BP: (!) 147/90 Patient Position (if appropriate): Sitting Oxygen Therapy SpO2: 94 % O2 Device: Room Air Pain:   ADL: ADL Eating: Independent Grooming: Independent Upper Body Bathing: Setup Where Assessed-Upper Body Bathing: Shower Lower Body Bathing: Supervision/safety Where Assessed-Lower Body Bathing: Shower Upper Body Dressing: Setup Where Assessed-Upper Body Dressing: Chair Lower Body Dressing: Setup Where Assessed-Lower Body Dressing: Chair Toileting: Setup Toilet Transfer: Distant supervision Toilet Transfer Method: Ambulating Tub/Shower Transfer: Close supervison Tub/Shower Transfer Method: Ambulating Vision   Perception    Praxis   Balance   Exercises:   Other Treatments:     Therapy/Group: {Therapy/Group:3049007}  [name] 02/23/2022, 6:55 AM

## 2022-02-23 NOTE — Progress Notes (Signed)
Physical Therapy Discharge Summary  Patient Details  Name: Lisa Ayala MRN: [id number] Date of Birth: 1965-11-21  Today's Date: 02/23/2022 PT Individual Time: 1045-1200 PT Individual Time Calculation (min): 75 min    Patient has met 4 of 4 long term goals due to improved activity tolerance, improved balance, improved postural control, and ability to compensate for deficits.  Patient to discharge at an ambulatory level Independent household mobility, supervision community mobility without an AD.   Patient's care partner is independent to provide the necessary physical assistance at discharge.  Reasons goals not met: All PT goals met at this time.  Recommendation:  Patient will benefit from ongoing skilled PT services in outpatient setting to continue to advance safe functional mobility, address ongoing impairments in dynamic balance/gait, endurance/activity tolerance, community integration, patient/caregiver education, and minimize fall risk.  Equipment: No equipment provided  Reasons for discharge: treatment goals met  Patient/family agrees with progress made and goals achieved: Yes Skilled Therapeutic Interventions: Patient sitting in the bed with her daughter in the room upon PT arrival. Patient alert and agreeable to PT session. Patient denied pain during session.  Vitals: HR 85-96 bpm with all activity, SPO2 >98%  Patient's daughter present for family education and hands on training throughout session. Performed safe guarding with all mobility following PT cues and/or demonstration. Educated on fall risk/prevention, home modifications to prevent falls, and activation of emergency services in the event of a fall with LOC or injury. Also educated on BP and HR monitoring, signs of A-fib, increased risk of stroke and stroke signs/symptoms using BE FAST acronym during session. Patient does not have a way to monitor vitals at home, recommended buying new or used pulse ox and BP machine.    Therapeutic Activity: Transfers: Patient performed sit to/from stand independently from various household surfaces without an AD. Patient performed a simulated sedan height car transfer with mod I using car frame for steadying assist.  PT demonstrated a floor transfer from sitting<>lying. Educated on signs and symptoms to assess during self-assessment in the event of a fall and symptoms that indicate immediate medical attention. Instructed patient to have his phone in his pocket at all times to have access to call emergency services and not to get up if any sign of critical injury, symptoms, or LOC. Then instructed patient if no signs or symptoms present, he should call for assistance from another person, crawl to the nearest stable place to sit and perform a transfer from the floor to the seat, not to standing for safety to reduce risk of a second fall. Patient then performed a floor transfer from mat table<>mat on the floor independently x2 demonstrating good technique following PT instruction.   Gait Training:  Patient ambulated throughout the unit independently without an AD. Patient ascended/descended 22 steps (2 flights in the stair well) using L rail with mod I. Performed reciprocal gait pattern throughout. Patient ambulated up/down a ramp, over 10 feet of mulch (unlevel surface), and up/down a curb to simulate community ambulation over unlevel surfaces independently without an AD.  6 Min Walk Test:  Instructed patient to ambulate as quickly and as safely as possible for 6 minutes using LRAD. Patient was allowed to take standing rest breaks without stopping the test, but if the patient required a sitting rest break the clock would be stopped and the test would be over.  Results: 1410 feet without rest breaks for full 6 min (429 meters, Avg speed 1.2 m/s) independently without an AD. Results indicate  that the patient has reduced endurance with ambulation compared to age matched norms.  Age  Matched Norms: 64-69 yo F: 538 meters MDC: 58.21 meters (190.98 feet) or 50 meters (ANPTA Core Set of Outcome Measures for Adults with Neurologic Conditions, 2018)  Neuromuscular Re-ed: Patient demonstrates increased fall risk as noted by score of 29/30 on  Functional Gait Assessment, see details below.  (<19=increased fall risk with dynamic gait)  Patient in the room with her daughter at end of session.  PT Discharge Precautions/Restrictions Restrictions Weight Bearing Restrictions: No Pain Pain Assessment Pain Scale: 0-10 Pain Score: 0-No pain Pain Interference Pain Interference Pain Effect on Sleep: 0. Does not apply - I have not had any pain or hurting in the past 5 days Pain Interference with Therapy Activities: 0. Does not apply - I have not received rehabilitationtherapy in the past 5 days Pain Interference with Day-to-Day Activities: 1. Rarely or not at all Vision/Perception  Vision - History Ability to See in Adequate Light: 0 Adequate Vision - Assessment Eye Alignment: Within Functional Limits Ocular Range of Motion: Within Functional Limits Tracking/Visual Pursuits: Able to track stimulus in all quads without difficulty Saccades: Within functional limits Convergence: Within functional limits Perception Perception: Within Functional Limits Praxis Praxis: Intact  Cognition Overall Cognitive Status: Impaired/Different from baseline Arousal/Alertness: Awake/alert Orientation Level: Oriented X4 Memory: Impaired Memory Impairment: Decreased short term memory;Decreased recall of new information;Retrieval deficit Awareness: Appears intact Problem Solving: Appears intact Safety/Judgment: Appears intact Sensation Sensation Light Touch: Appears Intact Hot/Cold: Appears Intact Proprioception: Appears Intact Coordination Gross Motor Movements are Fluid and Coordinated: Yes Fine Motor Movements are Fluid and Coordinated: Yes Motor  Motor Motor - Skilled Clinical  Observations: decreased endurance, generalized deconditioning  Mobility Bed Mobility Bed Mobility: Rolling Right;Rolling Left;Sit to Supine;Supine to Sit Rolling Right: Independent Rolling Left: Independent Supine to Sit: Independent Sit to Supine: Independent Transfers Transfers: Sit to Stand;Stand to Sit;Stand Pivot Transfers Sit to Stand: Independent Stand to Sit: Independent Stand Pivot Transfers: Independent Transfer (Assistive device): None Locomotion  Gait Gait Distance (Feet): 1410 Feet Gait Gait: Yes Gait Pattern: Within Functional Limits Gait Pattern: Within Functional Limits Gait velocity: 1.2 m/s avg. on 6MWT Stairs / Additional Locomotion Stairs: Yes Stairs Assistance: Independent with assistive device Stair Management Technique: One rail Left Number of Stairs: 22 Height of Stairs: 6 Ramp: Independent Curb: Independent Wheelchair Mobility Wheelchair Mobility: No  Trunk/Postural Assessment  Cervical Assessment Cervical Assessment: Within Functional Limits Thoracic Assessment Thoracic Assessment: Within Functional Limits Lumbar Assessment Lumbar Assessment: Within Functional Limits Postural Control Postural Control: Deficits on evaluation  Balance Standardized Balance Assessment Standardized Balance Assessment: Berg Balance Test;Functional Gait Assessment Berg Balance Test Sit to Stand: Able to stand without using hands and stabilize independently Standing Unsupported: Able to stand safely 2 minutes Sitting with Back Unsupported but Feet Supported on Floor or Stool: Able to sit safely and securely 2 minutes Stand to Sit: Sits safely with minimal use of hands Transfers: Able to transfer safely, minor use of hands Standing Unsupported with Eyes Closed: Able to stand 10 seconds safely Standing Ubsupported with Feet Together: Able to place feet together independently and stand 1 minute safely From Standing, Reach Forward with Outstretched Arm: Can reach  confidently >25 cm (10") From Standing Position, Pick up Object from Floor: Able to pick up shoe safely and easily From Standing Position, Turn to Look Behind Over each Shoulder: Looks behind from both sides and weight shifts well Turn 360 Degrees: Able to turn 360 degrees safely  in 4 seconds or less Standing Unsupported, Alternately Place Feet on Step/Stool: Able to stand independently and safely and complete 8 steps in 20 seconds Standing Unsupported, One Foot in Front: Able to place foot tandem independently and hold 30 seconds Standing on One Leg: Able to lift leg independently and hold > 10 seconds Total Score: 56 Static Sitting Balance Static Sitting - Level of Assistance: 7: Independent Dynamic Sitting Balance Dynamic Sitting - Level of Assistance: 7: Independent Static Standing Balance Static Standing - Level of Assistance: 7: Independent Dynamic Standing Balance Dynamic Standing - Level of Assistance: 7: Independent Functional Gait  Assessment Gait assessed : Yes Gait Level Surface: Walks 20 ft in less than 5.5 sec, no assistive devices, good speed, no evidence for imbalance, normal gait pattern, deviates no more than 6 in outside of the 12 in walkway width. Change in Gait Speed: Able to smoothly change walking speed without loss of balance or gait deviation. Deviate no more than 6 in outside of the 12 in walkway width. Gait with Horizontal Head Turns: Performs head turns smoothly with no change in gait. Deviates no more than 6 in outside 12 in walkway width Gait with Vertical Head Turns: Performs head turns with no change in gait. Deviates no more than 6 in outside 12 in walkway width. Gait and Pivot Turn: Pivot turns safely within 3 sec and stops quickly with no loss of balance. Step Over Obstacle: Is able to step over 2 stacked shoe boxes taped together (9 in total height) without changing gait speed. No evidence of imbalance. Gait with Narrow Base of Support: Is able to ambulate  for 10 steps heel to toe with no staggering. Gait with Eyes Closed: Walks 20 ft, no assistive devices, good speed, no evidence of imbalance, normal gait pattern, deviates no more than 6 in outside 12 in walkway width. Ambulates 20 ft in less than 7 sec. Ambulating Backwards: Walks 20 ft, no assistive devices, good speed, no evidence for imbalance, normal gait Steps: Alternating feet, must use rail. Total Score: 29 Extremity Assessment  RLE Assessment RLE Assessment: Within Functional Limits Active Range of Motion (AROM) Comments: WFL for all functional mobility General Strength Comments: Grossly 5/5 throughout in sitting LLE Assessment LLE Assessment: Within Functional Limits Active Range of Motion (AROM) Comments: WFL for all functional mobility General Strength Comments: Grossly 5/5 throughout in sitting    Chenelle Benning L Teyana Pierron PT, DPT  02/23/2022, 12:38 PM

## 2022-02-23 NOTE — Progress Notes (Signed)
PROGRESS NOTE   Subjective/Complaints: Still not sleeping through the night but is getting a few hours. Up at sink cleaning up when I entered. No other issues  ROS: Patient denies fever, rash, sore throat, blurred vision, dizziness, nausea, vomiting, diarrhea, cough, shortness of breath or chest pain, joint or back/neck pain, headache, or mood change.    Objective:   No results found. Recent Labs    02/21/22 0604 02/22/22 0507  WBC 16.5* 12.1*  HGB 11.1* 10.4*  HCT 34.1* 33.2*  PLT 505* 560*   Recent Labs    02/21/22 1549 02/22/22 0507  NA 137 138  K 4.8 4.1  CL 107 111  CO2 19* 21*  GLUCOSE 148* 117*  BUN 14 13  CREATININE 1.15* 1.15*  CALCIUM 9.1 8.7*    Intake/Output Summary (Last 24 hours) at 02/23/2022 1013 Last data filed at 02/23/2022 [id number] Gross per 24 hour  Intake 168 ml  Output --  Net 168 ml        Physical Exam: Vital Signs Blood pressure (!) 147/90, pulse 86, temperature 98.5 F (36.9 C), temperature source Oral, resp. rate 18, height 5\' 7"  (1.702 m), weight 64.7 kg, SpO2 94 %.  Constitutional: No distress . Vital signs reviewed. HEENT: NCAT, EOMI, oral membranes moist Neck: supple Cardiovascular: RRR without murmur. No JVD    Respiratory/Chest: CTA Bilaterally without wheezes or rales. Normal effort    GI/Abdomen: BS +, non-tender, non-distended Ext: no clubbing, cyanosis, or edema Psych: pleasant and cooperative  Skin: Clean and intact without signs of breakdown Neuro:  pt alert, oriented to self, place. Normal language. Follows basic commands. Fair insight and awareness. Motor seems to be at least 4/5 in all 4limbs. Good standing balance.  Musculoskeletal: no pain with ROM. No edema.     Assessment/Plan: 1. Functional deficits which require 3+ hours per day of interdisciplinary therapy in a comprehensive inpatient rehab setting. Physiatrist is providing close team supervision and 24  hour management of active medical problems listed below. Physiatrist and rehab team continue to assess barriers to discharge/monitor patient progress toward functional and medical goals  Care Tool:  Bathing    Body parts bathed by patient: Right arm, Left arm, Chest, Abdomen, Front perineal area, Buttocks, Right upper leg, Left upper leg, Right lower leg, Left lower leg, Face         Bathing assist Assist Level: Supervision/Verbal cueing     Upper Body Dressing/Undressing Upper body dressing   What is the patient wearing?: Pull over shirt    Upper body assist Assist Level: Set up assist    Lower Body Dressing/Undressing Lower body dressing      What is the patient wearing?: Underwear/pull up, Pants     Lower body assist Assist for lower body dressing: Set up assist     Toileting Toileting    Toileting assist Assist for toileting: Independent     Transfers Chair/bed transfer  Transfers assist     Chair/bed transfer assist level: Independent     Locomotion Ambulation   Ambulation assist      Assist level: Supervision/Verbal cueing Assistive device: No Device Max distance: 930 ft   Walk 10 feet  activity   Assist     Assist level: Supervision/Verbal cueing Assistive device: No Device   Walk 50 feet activity   Assist    Assist level: Supervision/Verbal cueing Assistive device: No Device    Walk 150 feet activity   Assist    Assist level: Supervision/Verbal cueing Assistive device: No Device    Walk 10 feet on uneven surface  activity   Assist     Assist level: Supervision/Verbal cueing     Wheelchair     Assist Is the patient using a wheelchair?: No             Wheelchair 50 feet with 2 turns activity    Assist            Wheelchair 150 feet activity     Assist          Blood pressure (!) 147/90, pulse 86, temperature 98.5 F (36.9 C), temperature source Oral, resp. rate 18, height 5\' 7"   (1.702 m), weight 64.7 kg, SpO2 94 %.  Medical Problem List and Plan: 1. 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  Antithrombotics: -DVT/anticoagulation:  Pharmaceutical: Other (comment)--Eliquis             -antiplatelet therapy: N/A 3. Pain Management: Tylenol prn  4. Mood/sleep:               -antipsychotic agents: N/A  -trazodone: increase to 100mg  qhs 5. Neuropsych: This patient may be capable of making decisions on her own behalf. 6. Skin/Wound Care: Routine pressure relief measures 7. Fluids/Electrolytes/Nutrition: Strict I/O. HH diet.   8. Severe MR: Plans for MVR w/MAZE in the future 9. SVT/A fib with RVR s/p DCCV: Remains in A fib. Transitioned to po amiodarone on 05/29 -- Continue Eliquis -no palpitations/sx reported since admit 10. Flash pulmonary edema: Has had issues with BLE edema for weeks. 11. Leucocytosis: Has completed 7 day course of Zosyn for treatment for PNA.             --monitor for fevers and other signs of infection.  -wbc's down to 12k   12. AKI: Improving.  5/31 BUN/Cr stable  13. HTN: Monitor BP TID-- some elevation this morning -adjust Entresto and aldactone per cards.   14. Hypomagnesemia: Supplemented and started on Mag Ox 400 mg bid today --  Mg level  1.8 today.   15. Hypokalemia: Resolved with intermittent supplementation.   -currently 4.1--monitor 16. Anemia: in 10-11 range--  rechecking tomorrow      LOS: 2 days A FACE TO FACE EVALUATION WAS PERFORMED  Meredith Staggers 02/23/2022, 10:13 AM

## 2022-02-23 NOTE — Progress Notes (Signed)
Patient ID: Lisa Ayala, female   DOB: April 03, 1966, 56 y.o.   MRN: [id number]  Per medical team, pt is cleared to discharge to home tomorrow.   SW met with pt and pt dtr [name] in room to inform on above. SW discussed no PCP, and hospital f/u appt. Pt dtr reports sh received a phone call with change in appt to 6/9 at 9:40am. Pt dtr here until she returns to school in August [facility]). SW to order shower chair. SW explained charity care, and discussed MATCH medication assistance program.   SW ordered shower chair with [place] via parachute.   *in AVS pt PCP appt change is now at [facility]. SW informed pt and pt dtr on change in location and to defer to AVS for appt location. SW also informed attending deferred pt to cardiology for set up with cardiac rehab. Dtr confirms that shower chair was received.   Loralee Pacas, MSW, [place] Office: [phone] Cell: [phone] Fax: [phone]

## 2022-02-23 NOTE — Progress Notes (Signed)
  AHF Team  Chart reviewed.  BP overall stable (up a bit this am but otherwise ok). SCr stable.   If SBP remains consistently > 130 will increase Entresto to 49/51 bid.  Appreciate CIR team  Arvilla Meres, MD  8:46 AM

## 2022-02-23 NOTE — Care Management (Signed)
Inpatient Rehabilitation Center Individual Statement of Services  Patient Name:  Lisa Ayala  Date:  02/23/2022  Welcome to the Inpatient Rehabilitation Center.  Our goal is to provide you with an individualized program based on your diagnosis and situation, designed to meet your specific needs.  With this comprehensive rehabilitation program, you will be expected to participate in at least 3 hours of rehabilitation therapies Monday-Friday, with modified therapy programming on the weekends.  Your rehabilitation program will include the following services:  Physical Therapy (PT), Occupational Therapy (OT), Speech Therapy (ST), 24 hour per day rehabilitation nursing, Therapeutic Recreaction (TR), Psychology, Neuropsychology, Care Coordinator, Rehabilitation Medicine, Nutrition Services, Pharmacy Services, and Other  Weekly team conferences will be held on Tuesdays to discuss your progress.  Your Inpatient Rehabilitation Care Coordinator will talk with you frequently to get your input and to update you on team discussions.  Team conferences with you and your family in attendance may also be held.  Expected length of stay: 3 days    Overall anticipated outcome: Independent  Depending on your progress and recovery, your program may change. Your Inpatient Rehabilitation Care Coordinator will coordinate services and will keep you informed of any changes. Your Inpatient Rehabilitation Care Coordinator's name and contact numbers are listed  below.  The following services may also be recommended but are not provided by the Inpatient Rehabilitation Center:  Driving Evaluations Home Health Rehabiltiation Services Outpatient Rehabilitation Services Vocational Rehabilitation   Arrangements will be made to provide these services after discharge if needed.  Arrangements include referral to agencies that provide these services.  Your insurance has been verified to be:  Uninsured  Your primary doctor is:   No PCP  Pertinent information will be shared with your doctor and your insurance company.  Inpatient Rehabilitation Care Coordinator:  Susie Cassette [phone] or ([phone]  Information discussed with and copy given to patient by: Gretchen Short, 02/23/2022, 12:07 PM

## 2022-02-23 NOTE — Progress Notes (Signed)
Speech Language Pathology Daily Session Note  Patient Details  Name: Lisa Ayala MRN: [id number] Date of Birth: May 21, 1966  Today's Date: 02/23/2022 SLP Individual Time: 1300-1330 SLP Individual Time Calculation (min): 30 min  Short Term Goals: Week 1: SLP Short Term Goal 1 (Week 1): STGs=LTGs due to ELOS  Skilled Therapeutic Interventions: Skilled treatment session focused on cognitive goals. Patient ambulated to the SLP office with supervision. SLP facilitated session by providing overall supervision level verbal cues for functional problem solving and working memory during a complex scheduling task. Patient demonstrated appropriate awareness regarding difficulty of task and requested "homework" to continue to work on cognitive goals. Patient ambulated back to her room and left with daughter present. Continue with current plan of care.      Pain No/Denies Pain   Therapy/Group: Individual Therapy  Angeles Paolucci 02/23/2022, 4:22 PM

## 2022-02-23 NOTE — Progress Notes (Signed)
Inpatient Rehabilitation Care Coordinator Assessment and Plan Patient Details  Name: Lisa Ayala MRN: [id number] Date of Birth: 05-23-1966  Today's Date: 02/23/2022  Hospital Problems: Principal Problem:   Anoxic brain injury [facility]) Active Problems:   Atrial flutter ([place])   Severe mitral regurgitation  Past Medical History:  Past Medical History:  Diagnosis Date   Aortic insufficiency    PSVT (paroxysmal supraventricular tachycardia) (HCC)    Severe mitral regurgitation    Lisa. 08/2021 Echo: EF 70-75%, no rwma, mod dil LV, GrII DD, nl RV fxn, massively dil LA, mild-mod dil RA, severe MR due to lack of central coaptation of MV leaflets. Mild MS. Mod TR. Mild-mod AI.   Tricuspid regurgitation    Past Surgical History:  Past Surgical History:  Procedure Laterality Date   RIGHT/LEFT HEART CATH AND CORONARY ANGIOGRAPHY N/Lisa 02/10/2022   Procedure: RIGHT/LEFT HEART CATH AND CORONARY ANGIOGRAPHY;  Surgeon: Jolaine Artist, MD;  Location: [facility];  Service: Cardiovascular;  Laterality: N/Lisa;   TEE WITHOUT CARDIOVERSION N/Lisa 02/08/2022   Procedure: TRANSESOPHAGEAL ECHOCARDIOGRAM (TEE);  Surgeon: Geralynn Rile, MD;  Location: [place];  Service: Cardiovascular;  Laterality: N/Lisa;   Social History:  reports that she has never smoked. She has never used smokeless tobacco. She reports that she does not currently use alcohol. She reports current drug use. Drugs: Marijuana and Cocaine.  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 Caregiver Availability: 24/7 Family Dynamics: Pt lives alone, and currently staying with dtr in townhome.  Social  History Preferred language: English Religion: None Cultural Background: Pt was working in [employer] until she became sick. Education: high school grad Health Literacy - How often do you need to have someone help you when you read instructions, pamphlets, or other written material from your doctor or pharmacy?: Never Writes: Yes Employment Status: Unemployed Public relations account executive Issues: Denies Guardian/Conservator: N/Lisa   Abuse/Neglect Abuse/Neglect Assessment Can Be Completed: Yes Physical Abuse: Denies Verbal Abuse: Denies Sexual Abuse: Denies Exploitation of patient/patient's resources: Denies Self-Neglect: Denies  Patient response to: Social Isolation - How often do you feel lonely or isolated from those around you?: Never  Emotional Status Pt's affect, behavior and adjustment status: Pt in good spirits at time of visit. pt admits to anxiety since "all of this" has happened. Recent Psychosocial Issues: Current anxiety given medical issues Psychiatric History: Denies any hx Substance Abuse History: Pt admits that she was smoking marijuana daily- atleast 4 blunts per day. Admits to occassional etoh and denies rec drug use.  Patient / Family Perceptions, Expectations & Goals Pt/Family understanding of illness & functional limitations: Pt and dtr have Lisa general understanding of pt care needs Premorbid pt/family roles/activities: Independent Anticipated changes in roles/activities/participation: Assistance with IADLs Pt/family expectations/goals: pt goal is to "work on myself, get better."  [employer]: None Premorbid Home Care/DME Agencies: None Transportation available at discharge: Pt dtr Lisa Ayala Is the patient able to respond to transportation needs?: Yes In the past 12 months, has lack of transportation kept you from medical appointments or from getting medications?: No In the past 12 months, has lack of transportation kept you from  meetings, work, or from getting things needed for daily living?:  No Resource referrals recommended: Neuropsychology  Discharge Planning Living Arrangements: Alone Support Systems: Children Type of Residence: Private residence Insurance Resources: Teacher, adult education Resources: Family Support Financial Screen Referred: Yes Living Expenses: Lives with family Money Management: Patient Does the patient have any problems obtaining your medications?: No Home Management: Pt managed all homecare needs Patient/Family Preliminary Plans: TBD Care Coordinator Barriers to Discharge: Decreased caregiver support, Lack of/limited family support, Other (comments) Care Coordinator Barriers to Discharge Comments: Uninsured. Medicaid/disablity is pending at this time. Care Coordinator Anticipated Follow Up Needs: HH/OP Expected length of stay: d/c on 02/24/22  Clinical Impression SW met with pt and pt dtr in room to introduce self, explain role, and discuss discharge process.pt is not Lisa English as Lisa second language teacher. No HCPOA. No DME.   Lisa Ayala Lisa Ayala 02/23/2022, 12:06 PM

## 2022-02-23 NOTE — Progress Notes (Signed)
Occupational Therapy Session Note  Patient Details  Name: JAZALYN MONDOR MRN: [id number] Date of Birth: 1966/03/23  Today's Date: 02/23/2022 OT Individual Time: 1445-1545 OT Individual Time Calculation (min): 60 min   Short Term Goals: Week 1:  OT Short Term Goal 1 (Week 1): STG=LTG d/t ELOS  Skilled Therapeutic Interventions/Progress Updates:    Pt greeted seated in recliner with daughter present and agreeable to OT treatment session. Pt ambulated to therapy gym independently without AD. Pt stood on foam block to work on hip and ankle balance strategies while performing BITS activity focused on visual scanning, alternating attention, trail making, short term memory, and problem solving. Dual task activity ambulating in hallway while balancing ball on saucer. OT increased challenge by having pt say the months of the year backwards, and count backwards by even and odd numbers. Pt returned to room and left seated in recliner with needs met.   Therapy Documentation Precautions:  Precautions Precautions: Fall Restrictions Weight Bearing Restrictions: No Other Position/Activity Restrictions: Patient's daughter cleared for transfers in the room, no showers. Pain:  Denies pain   Therapy/Group: Individual Therapy  Valma Cava 02/23/2022, 2:13 PM

## 2022-02-24 ENCOUNTER — Other Ambulatory Visit (HOSPITAL_COMMUNITY): Payer: Self-pay

## 2022-02-24 LAB — CBC
HCT: 34.5 % — ABNORMAL LOW (ref 36.0–46.0)
Hemoglobin: 10.9 g/dL — ABNORMAL LOW (ref 12.0–15.0)
MCH: 28.5 pg (ref 26.0–34.0)
MCHC: 31.6 g/dL (ref 30.0–36.0)
MCV: 90.3 fL (ref 80.0–100.0)
Platelets: 674 10*3/uL — ABNORMAL HIGH (ref 150–400)
RBC: 3.82 MIL/uL — ABNORMAL LOW (ref 3.87–5.11)
RDW: 18.8 % — ABNORMAL HIGH (ref 11.5–15.5)
WBC: 9.3 10*3/uL (ref 4.0–10.5)
nRBC: 0 % (ref 0.0–0.2)

## 2022-02-24 MED ORDER — APIXABAN 5 MG PO TABS
5.0000 mg | ORAL_TABLET | Freq: Two times a day (BID) | ORAL | 1 refills | Status: DC
  Filled 2022-02-24: qty 60, 30d supply, fill #0

## 2022-02-24 MED ORDER — AMIODARONE HCL 200 MG PO TABS
200.0000 mg | ORAL_TABLET | Freq: Two times a day (BID) | ORAL | 1 refills | Status: DC
  Filled 2022-02-24: qty 60, 30d supply, fill #0

## 2022-02-24 MED ORDER — TRAZODONE HCL 100 MG PO TABS
100.0000 mg | ORAL_TABLET | Freq: Every day | ORAL | 0 refills | Status: DC
  Filled 2022-02-24: qty 30, 30d supply, fill #0

## 2022-02-24 MED ORDER — CERTAVITE/ANTIOXIDANTS PO TABS
1.0000 | ORAL_TABLET | Freq: Every day | ORAL | 0 refills | Status: DC
  Filled 2022-02-24: qty 30, 30d supply, fill #0

## 2022-02-24 MED ORDER — DAPAGLIFLOZIN PROPANEDIOL 10 MG PO TABS
10.0000 mg | ORAL_TABLET | Freq: Every day | ORAL | Status: DC
  Administered 2022-02-24: 10 mg via ORAL
  Filled 2022-02-24: qty 1

## 2022-02-24 MED ORDER — POLYETHYLENE GLYCOL 3350 17 GM/SCOOP PO POWD
17.0000 g | Freq: Every day | ORAL | 0 refills | Status: DC
  Filled 2022-02-24: qty 238, 14d supply, fill #0

## 2022-02-24 MED ORDER — DAPAGLIFLOZIN PROPANEDIOL 10 MG PO TABS
10.0000 mg | ORAL_TABLET | Freq: Every day | ORAL | 0 refills | Status: DC
  Filled 2022-02-24: qty 30, 30d supply, fill #0

## 2022-02-24 MED ORDER — MAGNESIUM SULFATE 2 GM/50ML IV SOLN
2.0000 g | Freq: Once | INTRAVENOUS | Status: AC
  Administered 2022-02-24: 2 g via INTRAVENOUS
  Filled 2022-02-24: qty 50

## 2022-02-24 MED ORDER — SPIRONOLACTONE 25 MG PO TABS
12.5000 mg | ORAL_TABLET | Freq: Every day | ORAL | 0 refills | Status: DC
  Filled 2022-02-24: qty 15, 30d supply, fill #0

## 2022-02-24 MED ORDER — QUETIAPINE FUMARATE 25 MG PO TABS
25.0000 mg | ORAL_TABLET | Freq: Every evening | ORAL | 0 refills | Status: DC | PRN
  Filled 2022-02-24: qty 30, 30d supply, fill #0

## 2022-02-24 MED ORDER — MAGNESIUM OXIDE 400 MG PO TABS
400.0000 mg | ORAL_TABLET | Freq: Two times a day (BID) | ORAL | 0 refills | Status: DC
  Filled 2022-02-24: qty 60, 30d supply, fill #0

## 2022-02-24 MED ORDER — PANTOPRAZOLE SODIUM 40 MG PO TBEC
40.0000 mg | DELAYED_RELEASE_TABLET | Freq: Every day | ORAL | 0 refills | Status: DC
  Filled 2022-02-24: qty 30, 30d supply, fill #0

## 2022-02-24 MED ORDER — SACUBITRIL-VALSARTAN 24-26 MG PO TABS
1.0000 | ORAL_TABLET | Freq: Two times a day (BID) | ORAL | 0 refills | Status: DC
  Filled 2022-02-24: qty 60, 30d supply, fill #0

## 2022-02-24 MED ORDER — DOCUSATE SODIUM 50 MG/5ML PO LIQD
100.0000 mg | Freq: Two times a day (BID) | ORAL | 0 refills | Status: DC
  Filled 2022-02-24: qty 473, 24d supply, fill #0

## 2022-02-24 MED ORDER — QUETIAPINE FUMARATE 25 MG PO TABS: 25.0000 mg | ORAL_TABLET | Freq: Every evening | ORAL | Status: DC | PRN

## 2022-02-24 NOTE — Progress Notes (Signed)
INPATIENT REHABILITATION DISCHARGE NOTE   Discharge instructions by: Jeannene Patella, PA  Verbalized understanding: yes  Skin care/Wound care healing? none  Pain: none  IV's: none  Tubes/Drains: none  O2: none  Safety instructions: reviewed with pt and daughter  Patient belongings: sent with pt  Discharged to: home  Discharged via: family transport  Notes: done  Gerald Stabs, Therapist, sports

## 2022-02-24 NOTE — Progress Notes (Signed)
Occupational Therapy Discharge Summary  Patient Details  Name: Lisa Ayala MRN: [id number] Date of Birth: 1966-05-17  Today's Date: 02/24/2022 OT Individual Time: 0700-0813 OT Individual Time Calculation (min): 73 min   Pt received in recliner agreeable ot OT reporting already showered and describing shower/safety measures pt put in place (seated washing of feet/use of towels on floor etc). Pt did not recall she was supposed to wait for OT to compelte shower but completed independently with no LOB or falls. Pt and OT engage in higher level dial task processing and pathfinding tasks. Pt demo deficits in dual task with bouncing ball and naming animals with 3 incidences of repetition of words and need to stop activity to think of animal. Pt completes twoster trail making B test with 4 errors and difficulty remembering where she was in second half of the sequence. Pt completes pathfinding [facility] hospital with mod-max cuing for use of signs and written strategy--notes disorganized. Discussed ways to strategize making notes more organized to improve use d/t memory deficits. Exited session with pt seated in recliner and call light in reach    Patient has met 6 of 6 long term goals due to improved activity tolerance, improved balance, postural control, ability to compensate for deficits, improved attention, improved awareness, and improved coordination.  Patient to discharge at overall Independent level.  Patient's care partner is independent to provide the necessary cognitive assistance at discharge. Pt has made excellent progress improving to independent level with better adherence to safety strategies and awareness of cognitive implicaitons of anoxic BI.  Reasons goals not met: n/a  Recommendation:  Patient will benefit from ongoing skilled OT services in home health setting to continue to advance functional skills in the area of BADL and iADL.  Equipment: Shower chair  Reasons for discharge:  treatment goals met and discharge from hospital  Patient/family agrees with progress made and goals achieved: Yes  OT Discharge Precautions/Restrictions    General   Vital Signs Therapy Vitals Temp: 97.7 F (36.5 C) Temp Source: Oral Pulse Rate: (!) 101 Resp: 18 BP: 115/85 Patient Position (if appropriate): Sitting Oxygen Therapy SpO2: 100 % O2 Device: Room Air Pain   ADL Independent at ambulatory level Pain Pain Assessment Pain Scale: 0-10 Pain Score: 0-No pain Pain Interference Pain Interference Pain Effect on Sleep: 0. Does not apply - I have not had any pain or hurting in the past 5 days Pain Interference with Therapy Activities: 0. Does not apply - I have not received rehabilitationtherapy in the past 5 days Pain Interference with Day-to-Day Activities: 1. Rarely or not at all Vision/Perception  Vision - History Ability to See in Adequate Light: 0 Adequate Vision - Assessment Eye Alignment: Within Functional Limits Ocular Range of Motion: Within Functional Limits Tracking/Visual Pursuits: Able to track stimulus in all quads without difficulty Saccades: Within functional limits Convergence: Within functional limits Perception Perception: Within Functional Limits Praxis Praxis: Intact  Cognition Overall Cognitive Status: Impaired/Different from baseline Arousal/Alertness: Awake/alert Orientation Level: Oriented X4 Memory: Impaired Memory Impairment: Decreased short term memory;Decreased recall of new information;Retrieval deficit Awareness: Appears intact Problem Solving: Appears intact Safety/Judgment: Appears intact Sensation Sensation Light Touch: Appears Intact Hot/Cold: Appears Intact Proprioception: Appears Intact Coordination Gross Motor Movements are Fluid and Coordinated: Yes Fine Motor Movements are Fluid and Coordinated: Yes Motor  Motor Motor - Skilled Clinical Observations: decreased endurance, generalized deconditioning   Mobility Bed Mobility Bed Mobility: Rolling Right;Rolling Left;Sit to Supine;Supine to Sit Rolling Right: Independent Rolling Left: Independent Supine  to Sit: Independent Sit to Supine: Independent Transfers Transfers: Sit to Stand;Stand to Sit;Stand Pivot Transfers Sit to Stand: Independent Stand to Sit: Independent Stand Pivot Transfers: Independent Transfer (Assistive device): None Locomotion  Gait Gait Distance (Feet): 1410 Feet Gait Gait: Yes Gait Pattern: Within Functional Limits Gait Pattern: Within Functional Limits Gait velocity: 1.2 m/s avg. on 6MWT Stairs / Additional Locomotion Stairs: Yes Stairs Assistance: Independent with assistive device Stair Management Technique: One rail Left Number of Stairs: 22 Height of Stairs: 6 Ramp: Independent Curb: Independent Wheelchair Mobility Wheelchair Mobility: No  Trunk/Postural Assessment  Cervical Assessment Cervical Assessment: Within Functional Limits Thoracic Assessment Thoracic Assessment: Within Functional Limits Lumbar Assessment Lumbar Assessment: Within Functional Limits Postural Control Postural Control: Deficits on evaluation  Balance Standardized Balance Assessment Standardized Balance Assessment: Berg Balance Test;Functional Gait Assessment Berg Balance Test Sit to Stand: Able to stand without using hands and stabilize independently Standing Unsupported: Able to stand safely 2 minutes Sitting with Back Unsupported but Feet Supported on Floor or Stool: Able to sit safely and securely 2 minutes Stand to Sit: Sits safely with minimal use of hands Transfers: Able to transfer safely, minor use of hands Standing Unsupported with Eyes Closed: Able to stand 10 seconds safely Standing Ubsupported with Feet Together: Able to place feet together independently and stand 1 minute safely From Standing, Reach Forward with Outstretched Arm: Can reach confidently >25 cm (10") From Standing Position, Pick up Object  from Floor: Able to pick up shoe safely and easily From Standing Position, Turn to Look Behind Over each Shoulder: Looks behind from both sides and weight shifts well Turn 360 Degrees: Able to turn 360 degrees safely in 4 seconds or less Standing Unsupported, Alternately Place Feet on Step/Stool: Able to stand independently and safely and complete 8 steps in 20 seconds Standing Unsupported, One Foot in Front: Able to place foot tandem independently and hold 30 seconds Standing on One Leg: Able to lift leg independently and hold > 10 seconds Total Score: 56 Static Sitting Balance Static Sitting - Level of Assistance: 7: Independent Dynamic Sitting Balance Dynamic Sitting - Level of Assistance: 7: Independent Static Standing Balance Static Standing - Level of Assistance: 7: Independent Dynamic Standing Balance Dynamic Standing - Level of Assistance: 7: Independent Functional Gait  Assessment Gait assessed : Yes Gait Level Surface: Walks 20 ft in less than 5.5 sec, no assistive devices, good speed, no evidence for imbalance, normal gait pattern, deviates no more than 6 in outside of the 12 in walkway width. Change in Gait Speed: Able to smoothly change walking speed without loss of balance or gait deviation. Deviate no more than 6 in outside of the 12 in walkway width. Gait with Horizontal Head Turns: Performs head turns smoothly with no change in gait. Deviates no more than 6 in outside 12 in walkway width Gait with Vertical Head Turns: Performs head turns with no change in gait. Deviates no more than 6 in outside 12 in walkway width. Gait and Pivot Turn: Pivot turns safely within 3 sec and stops quickly with no loss of balance. Step Over Obstacle: Is able to step over 2 stacked shoe boxes taped together (9 in total height) without changing gait speed. No evidence of imbalance. Gait with Narrow Base of Support: Is able to ambulate for 10 steps heel to toe with no staggering. Gait with Eyes  Closed: Walks 20 ft, no assistive devices, good speed, no evidence of imbalance, normal gait pattern, deviates no more than 6 in  outside 12 in walkway width. Ambulates 20 ft in less than 7 sec. Ambulating Backwards: Walks 20 ft, no assistive devices, good speed, no evidence for imbalance, normal gait Steps: Alternating feet, must use rail. Total Score: 29 Extremity Assessment  RLE Assessment RLE Assessment: Within Functional Limits Active Range of Motion (AROM) Comments: WFL for all functional mobility General Strength Comments: Grossly 5/5 throughout in sitting LLE Assessment LLE Assessment: Within Functional Limits Active Range of Motion (AROM) Comments: WFL for all functional mobility General Strength Comments: Grossly 5/5 throughout in sitting      Lowella Dell Bari Leib 02/24/2022, 6:52 AM

## 2022-02-24 NOTE — Progress Notes (Signed)
PROGRESS NOTE   Subjective/Complaints: Pt up in room. Packed and ready to go home!  ROS: Patient denies fever, rash, sore throat, blurred vision, dizziness, nausea, vomiting, diarrhea, cough, shortness of breath or chest pain, joint or back/neck pain, headache, or mood change.    Objective:   No results found. Recent Labs    02/22/22 0507 02/24/22 0531  WBC 12.1* 9.3  HGB 10.4* 10.9*  HCT 33.2* 34.5*  PLT 560* 674*   Recent Labs    02/21/22 1549 02/22/22 0507  NA 137 138  K 4.8 4.1  CL 107 111  CO2 19* 21*  GLUCOSE 148* 117*  BUN 14 13  CREATININE 1.15* 1.15*  CALCIUM 9.1 8.7*    Intake/Output Summary (Last 24 hours) at 02/24/2022 0921 Last data filed at 02/24/2022 0744 Gross per 24 hour  Intake 887 ml  Output --  Net 887 ml        Physical Exam: Vital Signs Blood pressure 115/85, pulse (!) 101, temperature 97.7 F (36.5 C), temperature source Oral, resp. rate 18, height 5\' 7"  (1.702 m), weight 64.2 kg, SpO2 100 %.  Constitutional: No distress . Vital signs reviewed. HEENT: NCAT, EOMI, oral membranes moist Neck: supple Cardiovascular: sl tachy withmurmur. No JVD    Respiratory/Chest: CTA Bilaterally without wheezes or rales. Normal effort    GI/Abdomen: BS +, non-tender, non-distended Ext: no clubbing, cyanosis, or edema Psych: pleasant and cooperative  Skin: Clean and intact without signs of breakdown Neuro:  pt alert, oriented to self, place. Normal language. Follows basic commands. Fair insight and awareness. Motor seems to be at least 4/5 in all 4limbs. Good standing balance.  Musculoskeletal: no pain with ROM. No edema.     Assessment/Plan: 1. Functional deficits which require 3+ hours per day of interdisciplinary therapy in a comprehensive inpatient rehab setting. Physiatrist is providing close team supervision and 24 hour management of active medical problems listed below. Physiatrist and  rehab team continue to assess barriers to discharge/monitor patient progress toward functional and medical goals  Care Tool:  Bathing    Body parts bathed by patient: Right arm, Left arm, Chest, Abdomen, Front perineal area, Buttocks, Right upper leg, Left upper leg, Right lower leg, Left lower leg, Face         Bathing assist Assist Level: Supervision/Verbal cueing     Upper Body Dressing/Undressing Upper body dressing   What is the patient wearing?: Pull over shirt    Upper body assist Assist Level: Set up assist    Lower Body Dressing/Undressing Lower body dressing      What is the patient wearing?: Underwear/pull up, Pants     Lower body assist Assist for lower body dressing: Set up assist     Toileting Toileting    Toileting assist Assist for toileting: Independent     Transfers Chair/bed transfer  Transfers assist     Chair/bed transfer assist level: Independent     Locomotion Ambulation   Ambulation assist      Assist level: Independent Assistive device: No Device Max distance: 1410   Walk 10 feet activity   Assist     Assist level: Independent Assistive device: No Device  Walk 50 feet activity   Assist    Assist level: Independent Assistive device: No Device    Walk 150 feet activity   Assist    Assist level: Independent Assistive device: No Device    Walk 10 feet on uneven surface  activity   Assist     Assist level: Independent     Wheelchair     Assist Is the patient using a wheelchair?: No             Wheelchair 50 feet with 2 turns activity    Assist            Wheelchair 150 feet activity     Assist          Blood pressure 115/85, pulse (!) 101, temperature 97.7 F (36.5 C), temperature source Oral, resp. rate 18, height 5\' 7"  (1.702 m), weight 64.2 kg, SpO2 100 %.  Medical Problem List and Plan: 1. Functional deficits secondary to anoxia from PEA arrest              -patient may  shower             -dc home today. F/u with cards/surgery as outpt. Does not need to see me. Is at cognitive baseline 2.  Antithrombotics: -DVT/anticoagulation:  Pharmaceutical: Other (comment)--Eliquis             -antiplatelet therapy: N/A 3. Pain Management: Tylenol prn  4. Mood/sleep:               -antipsychotic agents: seroquel 50mg  qhs--can reduce to 25mg  qhs prn  -trazodone: may continue 100mg  qhs 5. Neuropsych: This patient may be capable of making decisions on her own behalf. 6. Skin/Wound Care: Routine pressure relief measures 7. Fluids/Electrolytes/Nutrition: Strict I/O. HH diet.   8. Severe MR: Plans for MVR w/MAZE in the future 9. SVT/A fib with RVR s/p DCCV: Remains in A fib. Transitioned to po amiodarone on 05/29 -- Continue Eliquis -no palpitations/sx reported since admit 10. Flash pulmonary edema: Has had issues with BLE edema for weeks. 11. Leucocytosis: Has completed 7 day course of Zosyn for treatment for PNA.             --monitor for fevers and other signs of infection.  -wbc's down to 9k   12. AKI: Improving.  6/2 BUN/Cr stable  13. HTN: Monitor BP TID-- some elevation this morning -  Entresto and aldactone dosing per cards.   14. Hypomagnesemia: Supplemented and started on Mag Ox 400 mg bid today --  Mg level  1.8    15. Hypokalemia: Resolved with intermittent supplementation.   -currently 4.8 16. Anemia: in 10-11 range-- stable      LOS: 3 days A FACE TO FACE EVALUATION WAS PERFORMED  Meredith Staggers 02/24/2022, 9:21 AM

## 2022-02-24 NOTE — Discharge Summary (Signed)
Physician Discharge Summary  Patient ID: Lisa Ayala MRN: [id number] DOB/AGE: Sep 20, 1966 56 y.o.  Admit date: 02/21/2022 Discharge date: 02/24/2022  Discharge Diagnoses:  Principal Problem:   Anoxic brain injury [facility]) Active Problems:   Severe mitral regurgitation   Chronic diastolic heart failure [facility])   Discharged Condition: stable  Significant Diagnostic Studies: DG Abd 1 View  Result Date: 02/17/2022 CLINICAL DATA:  Ileus. EXAM: ABDOMEN - 1 VIEW COMPARISON:  Radiograph 01/12/2022 FINDINGS: Feeding 2 with tip in the proximal duodenum. Increasing gaseous distention of the colon. Transverse colon measures 6 cm which is within normal limits. There is gas in the rectum. IMPRESSION: 1. Feeding tube in first portion duodenum. 2. Gas distended colon without evidence obstruction. Electronically Signed   By: Suzy Bouchard M.D.   On: 02/17/2022 14:25   DG Abd 1 View  Result Date: 02/15/2022 CLINICAL DATA:  Ileus EXAM: ABDOMEN - 1 VIEW COMPARISON:  Portable exam 0820 hours compared to 02/08/2022 FINDINGS: Nonobstructive bowel gas pattern. No bowel dilatation or bowel wall thickening. Improved atelectasis versus consolidation LEFT lower lobe. No urinary tract calcification. Levoconvex thoracolumbar scoliosis. IMPRESSION: No acute abnormalities. Electronically Signed   By: Lavonia Dana M.D.   On: 02/15/2022 08:27   CT HEAD WO CONTRAST (5MM)  Result Date: 02/15/2022 CLINICAL DATA:  Altered mental status. EXAM: CT HEAD WITHOUT CONTRAST TECHNIQUE: Contiguous axial images were obtained from the base of the skull through the vertex without intravenous contrast. RADIATION DOSE REDUCTION: This exam was performed according to the departmental dose-optimization program which includes automated exposure control, adjustment of the mA and/or kV according to patient size and/or use of iterative reconstruction technique. COMPARISON:  None Available. FINDINGS: Brain: No evidence of acute infarction, hemorrhage,  hydrocephalus, extra-axial collection or mass lesion/mass effect. Focal area of low density within the left cerebellar hemisphere is identified, image 7/3. This is suspicious for subacute to chronic infarct. Vascular: No hyperdense vessel or unexpected calcification. Skull: Normal. Negative for fracture or focal lesion. Sinuses/Orbits: No acute finding. Other: None. IMPRESSION: 1. No acute intracranial abnormalities. 2. Focal area of low density within the left cerebellar hemisphere is suspicious for subacute to chronic infarct. Electronically Signed   By: Kerby Moors M.D.   On: 02/15/2022 13:20   CARDIAC CATHETERIZATION  Result Date: 02/10/2022   Ost RCA to Prox RCA lesion is 20% stenosed.   Prox RCA to Mid RCA lesion is 40% stenosed.   Mid LAD lesion is 40% stenosed.   The left ventricular ejection fraction is 55-65% by visual estimate. Findings: Ao = 139/74 LV = 136/12 RA = 9 RV = 46/10 PA = 50/10 (26) PCW = 12 (no significant v waves) Fick cardiac output/index = 12.2/6.8 PVR = 1.8 WU Ao sat = 99% PA sat = 79%, 80% SVC sat = 85% Assessment: 1. Mild non-obstructive CAD 2. EF 60-65% 3. Severe MR by echo. No significant v-waves in PCWP tracing 4. High cardiac output - may be overestimated by Fick Plan/Discussion: Will plan to wean vent. TCTS consulted for MVR. Glori Bickers, MD 4:27 PM  DG CHEST PORT 1 VIEW  Result Date: 02/13/2022 CLINICAL DATA:  Endotracheal tube placement. EXAM: PORTABLE CHEST 1 VIEW COMPARISON:  Feb 12, 2022. FINDINGS: Stable cardiomegaly. Endotracheal and nasogastric tubes are in grossly good position. Right internal jugular catheter is in good position. Bibasilar atelectasis or edema is noted with associated pleural effusions. Bony thorax is unremarkable. IMPRESSION: Stable support apparatus. Stable bibasilar atelectasis or edema is noted with associated pleural effusions.  Electronically Signed   By: Marijo Conception M.D.   On: 02/13/2022 09:23   DG CHEST PORT 1 VIEW  Result  Date: 02/12/2022 CLINICAL DATA:  ETT assessment EXAM: PORTABLE CHEST 1 VIEW COMPARISON:  Feb 11, 2022 FINDINGS: The ETT terminates 3 cm above the carina. The right central line terminates in the central SVC. The NG tube terminates below today's film. No pneumothorax. Bilateral effusions, right greater than left. Atelectasis underlying the effusions. Mild interstitial prominence without overt edema. No change in the cardiomediastinal silhouette which is partially obscured by transcutaneous pacer leads. IMPRESSION: 1. Support apparatus as above. 2. Right greater than left pleural effusions with underlying atelectasis. 3. Mild pulmonary venous congestion. Electronically Signed   By: Dorise Bullion III M.D.   On: 02/12/2022 08:20   DG CHEST PORT 1 VIEW  Result Date: 02/11/2022 CLINICAL DATA:  56 year old female intubated. Cardiogenic shock. EXAM: PORTABLE CHEST 1 VIEW COMPARISON:  Portable chest 02/08/2022 and earlier. FINDINGS: Portable AP semi upright view at 0734 hours. Stable right IJ central line. ETT tip in good position between the clavicles and carina. Enteric tube courses to the abdomen, tip not included. Pacer or resuscitation pads now project over the chest. Ongoing cardiomegaly, cardiac contour raising the possibility of pericardial effusion. Other mediastinal contours are within normal limits. No pneumothorax. Regressed but not resolved diffuse pulmonary interstitial opacity, apical pulmonary vascularity now appears normal. Continued veiling and confluent opacity at both lung bases. IMPRESSION: 1. Satisfactory visible lines and tubes. 2. Cardiomegaly, consider pericardial effusion. 3. Regressed but not resolved pulmonary interstitial opacity, favor regressed pulmonary edema. 4. Continued bibasilar opacity which probably reflects combination of pleural effusions and lower lobe collapse or consolidation. Electronically Signed   By: Genevie Ann M.D.   On: 02/11/2022 08:00   DG Chest Port 1 View  Result  Date: 02/08/2022 CLINICAL DATA:  Nasogastric tube placement and central line placement EXAM: PORTABLE CHEST 1 VIEW COMPARISON:  Earlier today at 2:41 p.m. FINDINGS: 3:57 p.m. Endotracheal tube terminates 2.2 cm above carina. Nasogastric tube extends beyond the inferior aspect of the film. Right internal jugular line tip is favored to terminate at the high right atrium or cavoatrial junction. Numerous leads and wires project over the chest. Patient is rotated left. Mild cardiomegaly. Probable small bilateral pleural effusions. No pneumothorax. Interstitial and airspace disease is symmetric, lower lung predominant, and progressive. IMPRESSION: Mild position degradation. Right internal jugular line tip is favored to terminate at the high right atrium or cavoatrial junction. No pneumothorax. Nasogastric tube extends beyond the inferior aspect of the film. Progressive interstitial and airspace disease, likely pulmonary edema. Electronically Signed   By: Abigail Miyamoto M.D.   On: 02/08/2022 16:17   DG CHEST PORT 1 VIEW  Result Date: 02/08/2022 CLINICAL DATA:  Shortness of breath EXAM: PORTABLE CHEST 1 VIEW COMPARISON:  02/07/2022 FINDINGS: Endotracheal tube is 3 cm above the carina. Right central line tip at the cavoatrial junction. No pneumothorax. Cardiomegaly. Diffuse bilateral interstitial and airspace opacities. This could reflect edema or infection. No visible effusions or pneumothorax. No acute bony abnormality. IMPRESSION: Cardiomegaly. Diffuse bilateral interstitial and alveolar opacities could reflect edema or infection. Electronically Signed   By: Rolm Baptise M.D.   On: 02/08/2022 15:12   DG Chest Port 1 View  Result Date: 02/07/2022 CLINICAL DATA:  Tachycardia. EXAM: PORTABLE CHEST 1 VIEW COMPARISON:  Chest x-ray 07/02/2021. FINDINGS: Heart is enlarged, unchanged. There are small bilateral pleural effusions. There is some strandy opacities in the lung  bases. No pneumothorax or acute fracture.  IMPRESSION: 1. Cardiomegaly with small pleural effusions and minimal bibasilar atelectasis/edema. Electronically Signed   By: Ronney Asters M.D.   On: 02/07/2022 19:45   DG Abd Portable 1V  Result Date: 02/15/2022 CLINICAL DATA:  Feeding tube placement EXAM: PORTABLE ABDOMEN - 1 VIEW COMPARISON:  02/16/2020 FINDINGS: Feeding tube enters the stomach with the tip near the gastric antrum. Normal bowel gas pattern Cardiac enlargement. IMPRESSION: Feeding tube tip gastric antrum. Electronically Signed   By: Franchot Gallo M.D.   On: 02/15/2022 13:17   DG Abd Portable 1V  Result Date: 02/08/2022 CLINICAL DATA:  NG tube placement EXAM: PORTABLE ABDOMEN - 1 VIEW COMPARISON:  06/18/2006 FINDINGS: Tip of enteric tube is seen in the antrum of the stomach. Bowel gas pattern is nonspecific. Pelvis is not included. Haziness in the lower lung fields suggest bilateral pleural effusions. Increased markings in the lower lung fields are noted, more so on the right side suggesting asymmetric pulmonary edema or multifocal pneumonia. IMPRESSION: Tip of enteric tube is seen in the antrum of the stomach. Electronically Signed   By: Elmer Picker M.D.   On: 02/08/2022 16:15   ECHOCARDIOGRAM COMPLETE  Result Date: 02/08/2022    ECHOCARDIOGRAM REPORT   Patient Name:   AYAH LIBRIZZI [facility] Date of Exam: 02/08/2022 Medical Rec #:  [id number]       Height:       67.0 in Accession #:    [id number]      Weight:       150.2 lb Date of Birth:  1966-08-17        BSA:          1.791 m Patient Age:    43 years        BP:           124/91 mmHg Patient Gender: F               HR:           69 bpm. Exam Location:  Inpatient Procedure: 2D Echo, Cardiac Doppler and Color Doppler                           STAT ECHO Reported to: Dr Buford Dresser on 02/08/2022 5:30:00 PM. Indications:    Mitral valve insufficiency  History:        Patient has prior history of Echocardiogram examinations, most                 recent 08/25/2021. CHF, Mitral Valve  Disease; Arrythmias:Atrial                 Flutter.  Sonographer:    Clayton Lefort RDCS (AE) Referring Phys: [id number] [place]  Sonographer Comments: Echo performed with patient supine and on artificial respirator. IMPRESSIONS  1. Left ventricular ejection fraction, by estimation, is 65 to 70%. The left ventricle has normal function. The left ventricle has no regional wall motion abnormalities. There is moderate concentric left ventricular hypertrophy. Left ventricular diastolic parameters are consistent with Grade III diastolic dysfunction (restrictive).  2. Right ventricular systolic function is mildly reduced. The right ventricular size is normal. There is mildly elevated pulmonary artery systolic pressure.  3. Left atrial size was severely dilated.  4. Right atrial size was mildly dilated.  5. A small pericardial effusion is present.  6. 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 Trivial mitral stenosis.  7.  Tricuspid valve regurgitation is moderate.  8. The aortic valve is tricuspid. There is mild calcification of the aortic valve. Aortic valve regurgitation is mild.  9. The inferior vena cava is dilated in size with <50% respiratory variability, suggesting right atrial pressure of 15 mmHg. Comparison(s): Prior images reviewed side by side. Conclusion(s)/Recommendation(s): No significant changes from prior--discussed with Dr. Gardiner Rhyme. Severe MR. Valves better seen on prior imaging. FINDINGS  Left Ventricle: Left ventricular ejection fraction, by estimation, is 65 to 70%. The left ventricle has normal function. The left ventricle has no regional wall motion abnormalities. The left ventricular internal cavity size was normal in size. There is  moderate concentric left ventricular hypertrophy. Left ventricular diastolic parameters are consistent with Grade III diastolic dysfunction (restrictive). Right Ventricle: The right ventricular size is normal. Right vetricular wall  thickness was not well visualized. Right ventricular systolic function is mildly reduced. There is mildly elevated pulmonary artery systolic pressure. The tricuspid regurgitant velocity is 2.69 m/s, and with an assumed right atrial pressure of 15 mmHg, the estimated right ventricular systolic pressure is [id number] mmHg. Left Atrium: Left atrial size was severely dilated. Right Atrium: Right atrial size was mildly dilated. Pericardium: A small pericardial effusion is present. Mitral Valve: The mitral valve is rheumatic. There is severe thickening of the mitral valve leaflet(s). Moderately decreased mobility of the mitral valve leaflets. Severe mitral valve regurgitation, with eccentric posteriorly directed jet. Trivial mitral  valve stenosis. MV peak gradient, 4.5 mmHg. The mean mitral valve gradient is 1.0 mmHg. Tricuspid Valve: The tricuspid valve is not well visualized. Tricuspid valve regurgitation is moderate. Aortic Valve: The aortic valve is tricuspid. There is mild calcification of the aortic valve. Aortic valve regurgitation is mild. Aortic valve mean gradient measures 4.0 mmHg. Aortic valve peak gradient measures 7.8 mmHg. Aortic valve area, by VTI measures 2.10 cm. Pulmonic Valve: The pulmonic valve was not well visualized. Pulmonic valve regurgitation is trivial. No evidence of pulmonic stenosis. Aorta: The aortic arch was not well visualized and the aortic root and ascending aorta are structurally normal, with no evidence of dilitation. Venous: The inferior vena cava is dilated in size with less than 50% respiratory variability, suggesting right atrial pressure of 15 mmHg. IAS/Shunts: The atrial septum is grossly normal.  LEFT VENTRICLE PLAX 2D LVIDd:         3.60 cm     Diastology LVIDs:         2.20 cm     LV e' medial:    5.25 cm/s LV PW:         1.60 cm     LV E/e' medial:  20.0 LV IVS:        1.70 cm     LV e' lateral:   9.31 cm/s LVOT diam:     1.90 cm     LV E/e' lateral: 11.3 LV SV:         48 LV SV  Index:   27 LVOT Area:     2.84 cm  LV Volumes (MOD) LV vol d, MOD A2C: 95.1 ml LV vol d, MOD A4C: 90.8 ml LV vol s, MOD A2C: 41.9 ml LV vol s, MOD A4C: 44.1 ml LV SV MOD A2C:     53.2 ml LV SV MOD A4C:     90.8 ml LV SV MOD BP:      49.0 ml RIGHT VENTRICLE RV Basal diam:  3.30 cm RV S prime:     6.87 cm/s TAPSE (M-mode): 1.2  cm LEFT ATRIUM              Index        RIGHT ATRIUM           Index LA diam:        2.80 cm  1.56 cm/m   RA Area:     19.00 cm LA Vol (A2C):   171.0 ml 95.50 ml/m  RA Volume:   49.00 ml  27.37 ml/m LA Vol (A4C):   119.0 ml 66.46 ml/m LA Biplane Vol: 153.0 ml 85.45 ml/m  AORTIC VALVE AV Area (Vmax):    2.15 cm AV Area (Vmean):   2.05 cm AV Area (VTI):     2.10 cm AV Vmax:           140.00 cm/s AV Vmean:          89.000 cm/s AV VTI:            0.228 m AV Peak Grad:      7.8 mmHg AV Mean Grad:      4.0 mmHg LVOT Vmax:         106.00 cm/s LVOT Vmean:        64.500 cm/s LVOT VTI:          0.169 m LVOT/AV VTI ratio: 0.74  AORTA Ao Root diam: 2.90 cm Ao Asc diam:  3.40 cm MITRAL VALVE                  TRICUSPID VALVE MV Area (PHT): 1.55 cm       TR Peak grad:   28.9 mmHg MV Area VTI:   1.36 cm       TR Vmax:        269.00 cm/s MV Peak grad:  4.5 mmHg MV Mean grad:  1.0 mmHg       SHUNTS MV Vmax:       1.06 m/s       Systemic VTI:  0.17 m MV Vmean:      50.6 cm/s      Systemic Diam: 1.90 cm MV Decel Time: 488 msec MR Peak grad:    100.8 mmHg MR Mean grad:    65.0 mmHg MR Vmax:         502.00 cm/s MR Vmean:        378.0 cm/s MR PISA:         6.28 cm MR PISA Eff ROA: 48 mm MR PISA Radius:  1.00 cm MV E velocity: 105.00 cm/s MV A velocity: 28.30 cm/s MV E/A ratio:  3.71 Buford Dresser MD Electronically signed by Buford Dresser MD Signature Date/Time: 02/08/2022/5:55:39 PM    Final    ECHO TEE  Result Date: 02/21/2022    TRANSESOPHOGEAL ECHO REPORT   Patient Name:   JONIE BAUERS [facility] Date of Exam: 02/09/2022 Medical Rec #:  [id number]       Height:       67.0 in Accession #:     [id number]      Weight:       148.6 lb Date of Birth:  May 30, 1966        BSA:          1.782 m Patient Age:    38 years        BP:           140/73 mmHg Patient Gender: F               HR:  67 bpm. Exam Location:  Inpatient Procedure: 2D Echo, Color Doppler and Cardiac Doppler Indications:     Mitral Regurgitation  History:         Patient has prior history of Echocardiogram examinations, most                  recent 02/08/2022. Arrythmias:Atrial Flutter.  Sonographer:     Irving Burton Senior RDCS Referring Phys:  [phone] AMY D CLEGG Diagnosing Phys: Arvilla Meres MD PROCEDURE: After discussion of the risks and benefits of a TEE, an informed consent was obtained from the patient. The transesophogeal probe was passed without difficulty through the esophogus of the patient. Sedation performed by performing physician. The patient developed no complications during the procedure. IMPRESSIONS  1. Left ventricular ejection fraction, by estimation, is 60 to 65%. The left ventricle has normal function. The left ventricle has no regional wall motion abnormalities.  2. Right ventricular systolic function is mildly reduced. The right ventricular size is normal.  3. Left atrial size was severely dilated. No left atrial/left atrial appendage thrombus was detected.  4. A small pericardial effusion is present. The pericardial effusion is localized near the right atrium and anterior to the right ventricle.  5. 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  6. The aortic valve is tricuspid. Aortic valve regurgitation is mild.  7. There is mild (Grade II) plaque involving the descending aorta. FINDINGS  Left Ventricle: Left ventricular ejection fraction, by estimation, is 60 to 65%. The left ventricle has normal function. The left ventricle has no regional wall motion abnormalities. The left ventricular internal cavity size was normal in size. Right Ventricle: The right ventricular size is normal. No increase in  right ventricular wall thickness. Right ventricular systolic function is mildly reduced. Left Atrium: Left atrial size was severely dilated. No left atrial/left atrial appendage thrombus was detected. Right Atrium: Right atrial size was normal in size. Pericardium: A small pericardial effusion is present. The pericardial effusion is localized near the right atrium and anterior to the right ventricle. Mitral Valve: The mitral valve is rheumatic. There is moderate thickening of the mitral valve leaflet(s). Moderately decreased mobility of the mitral valve leaflets. Severe mitral valve regurgitation, with centrally-directed jet. Tricuspid Valve: The tricuspid valve is normal in structure. Tricuspid valve regurgitation is mild. Aortic Valve: The aortic valve is tricuspid. Aortic valve regurgitation is mild. Pulmonic Valve: The pulmonic valve was grossly normal. Pulmonic valve regurgitation is not visualized. Aorta: The aortic root is normal in size and structure. There is mild (Grade II) plaque involving the descending aorta. IAS/Shunts: No atrial level shunt detected by color flow Doppler.  MR Peak grad:    219.0 mmHg MR Mean grad:    103.0 mmHg MR Vmax:         740.00 cm/s MR Vmean:        455.0 cm/s MR PISA:         3.08 cm MR PISA Eff ROA: 16 mm MR PISA Radius:  0.70 cm Arvilla Meres MD Electronically signed by Arvilla Meres MD Signature Date/Time: 02/21/2022/10:34:17 AM    Final    VAS US DOPPLER PRE CABG  Result Date: 02/15/2022 PREOPERATIVE VASCULAR EVALUATION Patient Name:  SHILOH PUENTE [facility]  Date of Exam:   02/15/2022 Medical Rec #: [id number]        Accession #:    [id number] Date of Birth: [birth year]         Patient Gender: F Patient Age:   48 years  Exam Location:  [facility] Procedure:      VAS US DOPPLER PRE CABG Referring Phys: STEVEN HENDRICKSON --------------------------------------------------------------------------------  Indications:      Pre-CABG. Other Factors:    MV disease.  Limitations:      Right bandaging/line Comparison Study: No prior study Performing Technologist: Maudry Mayhew MHA, RDMS, RVT, RDCS  Examination Guidelines: A complete evaluation includes B-mode imaging, spectral Doppler, color Doppler, and power Doppler as needed of all accessible portions of each vessel. Bilateral testing is considered an integral part of a complete examination. Limited examinations for reoccurring indications may be performed as noted.  Right Carotid Findings: +----------+--------+--------+--------+--------+--------+           PSV cm/sEDV cm/sStenosisDescribeComments +----------+--------+--------+--------+--------+--------+ CCA Prox  57      13                               +----------+--------+--------+--------+--------+--------+ ICA Prox  62      11                               +----------+--------+--------+--------+--------+--------+ ICA Distal63      16                               +----------+--------+--------+--------+--------+--------+ +----------+--------+-------+----------------+------------+           PSV cm/sEDV cmsDescribe        Arm Pressure +----------+--------+-------+----------------+------------+ Subclavian120            Multiphasic, WNL             +----------+--------+-------+----------------+------------+ +---------+--------+---+--------+--+---------+ VertebralPSV cm/s103EDV cm/s17Antegrade +---------+--------+---+--------+--+---------+ Left Carotid Findings: +----------+--------+-------+--------+----------------------+------------------+           PSV cm/sEDV    StenosisDescribe              Comments                             cm/s                                                    +----------+--------+-------+--------+----------------------+------------------+ CCA Prox  117     16                                                       +----------+--------+-------+--------+----------------------+------------------+ CCA Distal81      22                                   intimal thickening +----------+--------+-------+--------+----------------------+------------------+ ICA Prox  47      12             heterogenous and  smooth                                   +----------+--------+-------+--------+----------------------+------------------+ ICA Distal62      24                                                      +----------+--------+-------+--------+----------------------+------------------+ ECA       55      8                                                       +----------+--------+-------+--------+----------------------+------------------+ +----------+--------+--------+----------------+------------+ SubclavianPSV cm/sEDV cm/sDescribe        Arm Pressure +----------+--------+--------+----------------+------------+           163             Multiphasic, WNL             +----------+--------+--------+----------------+------------+ +---------+--------+--+--------+--+---------+ VertebralPSV cm/s53EDV cm/s19Antegrade +---------+--------+--+--------+--+---------+  ABI Findings: +--------+------------------+-----+---------+--------+ Right   Rt Pressure (mmHg)IndexWaveform Comment  +--------+------------------+-----+---------+--------+ Brachial                       triphasic         +--------+------------------+-----+---------+--------+ +--------+------------------+-----+---------+-------+ Left    Lt Pressure (mmHg)IndexWaveform Comment +--------+------------------+-----+---------+-------+ [id number]                    triphasic        +--------+------------------+-----+---------+-------+  Right Doppler Findings: +-----------+--------+-----+---------+-----------------------------------------+ Site        PressureIndexDoppler  Comments                                  +-----------+--------+-----+---------+-----------------------------------------+ Brachial                triphasic                                          +-----------+--------+-----+---------+-----------------------------------------+ Radial                  triphasic                                          +-----------+--------+-----+---------+-----------------------------------------+ Ulnar                   triphasic                                          +-----------+--------+-----+---------+-----------------------------------------+ Palmar Arch                      Signal obliterates with radial  compression, is unaffected with ulnar                                      comperssion                               +-----------+--------+-----+---------+-----------------------------------------+  Left Doppler Findings: +-----------+--------+-----+---------+--------------------+ Site       PressureIndexDoppler  Comments             +-----------+--------+-----+---------+--------------------+ Brachial   125          triphasic                     +-----------+--------+-----+---------+--------------------+ Radial                  triphasic                     +-----------+--------+-----+---------+--------------------+ Ulnar                   triphasic                     +-----------+--------+-----+---------+--------------------+ Palmar Arch                      Within normal limits +-----------+--------+-----+---------+--------------------+  Summary: Right Carotid: Velocities in the right ICA are consistent with a 1-39% stenosis.                Limited visualization secondary to bandaging. Left Carotid: Velocities in the left ICA are consistent with a 1-39% stenosis. Vertebrals:  Bilateral vertebral arteries demonstrate antegrade flow.  Subclavians: Normal flow hemodynamics were seen in bilateral subclavian              arteries.  Electronically signed by Harold Barban MD on 02/15/2022 at 8:52:49 PM.    Final    VAS [place] UPPER EXTREMITY VENOUS DUPLEX  Result Date: 02/15/2022 UPPER VENOUS STUDY  Patient Name:  CLARABELLA NOSER [facility]  Date of Exam:   02/15/2022 Medical Rec #: [id number]        Accession #:    [id number] Date of Birth: [birth year]         Patient Gender: F Patient Age:   62 years Exam Location:  [facility] Procedure:      VAS [place] UPPER EXTREMITY VENOUS DUPLEX Referring Phys: Ina Homes --------------------------------------------------------------------------------  Indications: Edema Comparison Study: No prior study Performing Technologist: Maudry Mayhew MHA, RDMS, RVT, RDCS  Examination Guidelines: A complete evaluation includes B-mode imaging, spectral Doppler, color Doppler, and power Doppler as needed of all accessible portions of each vessel. Bilateral testing is considered an integral part of a complete examination. Limited examinations for reoccurring indications may be performed as noted.  Right Findings: +----------+------------+---------+-----------+----------+------------+ RIGHT     CompressiblePhasicitySpontaneousProperties  Summary    +----------+------------+---------+-----------+----------+------------+ IJV                                Yes              line present +----------+------------+---------+-----------+----------+------------+ Subclavian    Full       Yes       Yes                           +----------+------------+---------+-----------+----------+------------+  Axillary      Full       Yes       Yes                           +----------+------------+---------+-----------+----------+------------+ Brachial      Full       Yes       Yes                           +----------+------------+---------+-----------+----------+------------+ Radial        Full                                                +----------+------------+---------+-----------+----------+------------+ Ulnar         Full                                               +----------+------------+---------+-----------+----------+------------+ Cephalic      Full                                               +----------+------------+---------+-----------+----------+------------+ Basilic       None                                     Acute     +----------+------------+---------+-----------+----------+------------+  Left Findings: +----------+------------+---------+-----------+----------+-------+ LEFT      CompressiblePhasicitySpontaneousPropertiesSummary +----------+------------+---------+-----------+----------+-------+ Subclavian               Yes       Yes                      +----------+------------+---------+-----------+----------+-------+  Summary:  Right: No evidence of deep vein thrombosis in the upper extremity. Findings consistent with acute superficial vein thrombosis involving the right basilic vein.  Left: No evidence of thrombosis in the subclavian.  *See table(s) above for measurements and observations.  Diagnosing physician: Harold Barban MD Electronically signed by Harold Barban MD on 02/15/2022 at 8:52:39 PM.    Final    US Abdomen Limited RUQ (LIVER/GB)  Result Date: 02/17/2022 CLINICAL DATA:  Cholecystitis. EXAM: ULTRASOUND ABDOMEN LIMITED RIGHT UPPER QUADRANT COMPARISON:  None Available. FINDINGS: Gallbladder: No gallstones or wall thickening visualized. No sonographic Murphy sign noted by sonographer. Common bile duct: Diameter: 6 mm. Upper limits of normal. No intrahepatic bile duct dilatation. Liver: No focal lesion identified. Within normal limits in parenchymal echogenicity. Portal vein is patent on color Doppler imaging with normal direction of blood flow towards the liver. Other: None. IMPRESSION: 1. No acute findings. No evidence for cholecystitis. 2.  Common bile duct is upper limits of normal measuring 6 mm. Electronically Signed   By: Kerby Moors M.D.   On: 02/17/2022 15:13    Labs:  Basic Metabolic Panel: No results for input(s): NA, K, CL, CO2, GLUCOSE, BUN, CREATININE, CALCIUM, MG, PHOS in the last 168 hours.   CBC: Recent Labs  Lab 02/24/22 0531  WBC 9.3  HGB 10.9*  HCT 34.5*  MCV 90.3  PLT 674*    CBG: No results for input(s): GLUCAP in the last 168 hours.   Brief HPI:   Lisa Ayala is a 56 y.o. female with history of PSVT, severe MR, moderate TR and mild to moderate AI, anxiety disorder, regular marijuana use who was admitted on 02/07/2022 via cardiology office with reports of tachycardia and rapid breathing with SOB.  She was found to have SVT with a flutter, was treated with Herbert Pun sent and started on IV heparin with plans for TEE.  She did develop hypoxia with acute on chronic diastolic heart failure, demand ischemia and found to have transaminitis as well as UDS positive for THC and cocaine.  She was treated with IV diuresis and started on amiodarone due to ongoing issues with a flutter.  She did have worsening of respiratory status prior to TEE requiring intubation and pressors but deteriorated to PEA arrest requiring CPR and epi x1 minute with ROSC.  Hospital course significant for issues with agitation, SVT with heart rates up to 200 requiring emergent DCCV on 05/19 and 05/20 as well as fevers due to PNA requiring Zosyn.  Dr. Roxan Hockey was consulted for input and recommended MVR once medically optimized.  She tolerated extubation on 05/23 and was placed on regular diet.  PT/OT evaluation completed and patient noted to have weakness with balance deficits.  CIR was recommended due to functional decline.   Hospital Course: Lisa Ayala was admitted to rehab 02/21/2022 for inpatient therapies to consist of PT, ST and OT at least three hours five days a week. Past admission physiatrist, therapy team and rehab RN have  worked together to provide customized collaborative inpatient rehab.  Of CBC showed H&H to be relatively stable and leukocytosis had resolved.  She has been afebrile during her stay and respiratory status has been stable.  Mild AKI noted and stable.  Her blood pressures were monitored on TID basis and have been stable.  No cardiac symptoms reported with increase in activity.  No signs of overload noted.  Dapagliflozin was added at discharge per cardiology recommendations.  She continues on trazodone to help manage sleep-wake disruption.  She made great gains during her rehab stay and is modified independent.  No further therapy recommended at this time.  She is to follow-up with cardiology for input on cardiac rehab.  She was discharged to home in improved condition.    Rehab course: During patient's stay in rehab team conference was held to discuss patient's progress, set goals and barriers to discharge. At admission, patient required supervision with mobility and basic ADL tasks. Cognistat was [facility] with exception of one-point below normal for short-term recall with reports of mild memory change. She she is able to complete ADL tasks at modified independent level.  She is modified independent for transfers and is able to ambulate 1400 feet without rest breaks.  She is able to complete mildly complex for functional tasks at modified independent level.  Daughter will provide supervision and assistance as needed after discharge.  Disposition: Home  Diet: Heart healthy.   Special Instructions: No driving or strenuous activity till cleared by MD.  Allergies as of 02/24/2022   No Known Allergies      Medication List     STOP taking these medications    aspirin EC 81 MG tablet   sacubitril-valsartan 24-26 MG Commonly known as: ENTRESTO       TAKE these medications    amiodarone 200  MG tablet Commonly known as: PACERONE Take 1 tablet (200 mg total) by mouth 2 (two) times daily.    CertaVite/Antioxidants Tabs Take 1 tablet by mouth daily.   docusate 50 MG/5ML liquid Commonly known as: COLACE Take 10 mLs (100 mg total) by mouth 2 (two) times daily. Notes to patient: Laxative --Can change to as needed if stools frequent or loose   Eliquis 5 MG Tabs tablet Generic drug: apixaban Take 1 tablet (5 mg total) by mouth 2 (two) times daily.   Farxiga 10 MG Tabs tablet Generic drug: dapagliflozin propanediol Take 1 tablet (10 mg total) by mouth daily.   magnesium oxide 400 MG tablet Commonly known as: MAG-OX Take 1 tablet (400 mg total) by mouth 2 (two) times daily.   pantoprazole 40 MG tablet Commonly known as: PROTONIX Take 1 tablet (40 mg total) by mouth at bedtime.   polyethylene glycol powder 17 GM/SCOOP powder Commonly known as: GLYCOLAX/MIRALAX Take 17 g by mouth daily dissolved in water. Notes to patient: LAXATIVE--can change to as needed   QUEtiapine 25 MG tablet Commonly known as: SEROQUEL Take 1 tablet (25 mg total) by mouth at bedtime as needed (sleep). What changed:  medication strength how much to take when to take this reasons to take this Notes to patient: For sleep and anxiety--dose has been cut in half.   spironolactone 25 MG tablet Commonly known as: ALDACTONE Take 1/2 tablet (12.5 mg total) by mouth daily.   traZODone 100 MG tablet Commonly known as: DESYREL Take 1 tablet (100 mg total) by mouth at bedtime.         Follow-up Information     Meredith Staggers, MD Follow up.   Specialty: Physical Medicine and Rehabilitation Contact information: [address] [place] [address] [phone]         Melrose Nakayama, MD Follow up on 03/15/2022.   Specialty: Cardiothoracic Surgery Why: Appointment at 3:30 pm Contact information: [place] [address] [phone]         Fenton Foy, NP Follow up on 03/03/2022.   Specialty: Pulmonary Disease Why: be there at  9:40 am Contact information: [place] [address] [phone]         Bensimhon, Shaune Pascal, MD Follow up.   Specialty: Cardiology Why: call for follow up appointment Contact information: [address] [place] [address] [phone]                 Signed: Bary Leriche 03/01/2022, 11:25 PM

## 2022-02-24 NOTE — IPOC Note (Signed)
Overall Plan of Care [facility]) Patient Details Name: Lisa Ayala MRN: [id number] DOB: [birth year]  Admitting Diagnosis: Anoxic brain injury [facility])  Hospital Problems: Principal Problem:   Anoxic brain injury ([place]) Active Problems:   Atrial flutter ([place])   Severe mitral regurgitation     Functional Problem List: Nursing Edema, Endurance, Medication Management, Motor, Perception, Safety  PT Balance, Behavior, Edema, Endurance, Motor, Nutrition, Safety, Skin Integrity  OT Balance, Endurance, Safety  SLP Cognition  TR         Basic ADL's: OT Bathing, Dressing     Advanced  ADL's: OT Simple Meal Preparation, Laundry, Light Housekeeping     Transfers: PT Bed Mobility, Bed to Chair, Car, [employer], Floor  OT Tub/Shower     Locomotion: PT Ambulation, Stairs     Additional Impairments: OT None  SLP Social Cognition   Problem Solving, Memory  TR      Anticipated Outcomes Item Anticipated Outcome  Self Feeding no goal  Swallowing      Basic self-care  independent  Toileting  independent   Bathroom Transfers independent  Bowel/Bladder  n/a  Transfers  independent  Locomotion  independent  Communication     Cognition  Mod I  Pain  n/a  Safety/Judgment  min assist   Therapy Plan: PT Intensity: Minimum of 1-2 x/day ,45 to 90 minutes PT Frequency: 5 out of 7 days PT Duration Estimated Length of Stay: 3 days OT Intensity: Minimum of 1-2 x/day, 45 to 90 minutes OT Frequency: 5 out of 7 days OT Duration/Estimated Length of Stay: 3 days SLP Intensity: Minumum of 1-2 x/day, 30 to 90 minutes SLP Frequency: 3 to 5 out of 7 days SLP Duration/Estimated Length of Stay: 2-4 days   Team Interventions: Nursing Interventions Patient/Family Education, Disease Management/Prevention, Medication Management, Discharge Planning  PT interventions Ambulation/gait training, Discharge planning, Functional mobility training, Psychosocial support, Therapeutic Activities,  Visual/perceptual remediation/compensation, Balance/vestibular training, Disease management/prevention, Neuromuscular re-education, Skin care/wound management, Therapeutic Exercise, UE/LE Strength taining/ROM, Pain management, Splinting/orthotics, DME/adaptive equipment instruction, Cognitive remediation/compensation, Community reintegration, Technical sales engineer stimulation, Patient/family education, IT trainer, UE/LE Coordination activities  OT Interventions Training and development officer, Patient/family education, DME/adaptive equipment instruction, Therapeutic Activities, Therapeutic Exercise, Functional mobility training, UE/LE Strength taining/ROM, Self Care/advanced ADL retraining, Discharge planning  SLP Interventions Cognitive remediation/compensation, Internal/external aids, Therapeutic Activities, Environmental controls, Cueing hierarchy, Functional tasks, Patient/family education  TR Interventions    SW/CM Interventions Discharge Planning, Psychosocial Support, Patient/Family Education   Barriers to Discharge MD  Medical stability  Nursing Decreased caregiver support, Home environment access/layout, Lack of/limited family support 2 level, 1 step, no rail. 1/2 bath main level, bedroom/bathroom upstairs with 15 steps. Daughter can provide care until August 15-19th, will return to school.  PT Home environment access/layout, Behavior    OT      SLP      SW Decreased caregiver support, Lack of/limited family support, Other (comments) Uninsured. Medicaid/disablity is pending at this time.   Team Discharge Planning: Destination: PT-Home ,OT- Home , SLP-Home Projected Follow-up: PT-Outpatient PT, OT-  None, SLP-None Projected Equipment Needs: PT-None recommended by PT, OT- Tub/shower seat, SLP-None recommended by SLP Equipment Details: PT- , OT-  Patient/family involved in discharge planning: PT- Patient, Family member/caregiver,  OT-Patient, SLP-Patient  MD ELOS: 3 days Medical  Rehab Prognosis:  Excellent Assessment: The patient has been admitted for CIR therapies with the diagnosis of mild anoxic bi after PEA cardiac arrest. The team will be addressing functional mobility, strength, stamina, balance, safety, adaptive techniques and  equipment, self-care, bowel and bladder mgt, patient and caregiver education, NMR, cognition safety. Goals have been set at mod I with cognition and independent with mobility and self-are. Anticipated discharge destination is home with family.        See Team Conference Notes for weekly updates to the plan of care

## 2022-02-24 NOTE — Progress Notes (Signed)
Speech Language Pathology Discharge Summary  Patient Details  Name: Lisa Ayala MRN: [id number] Date of Birth: [birth year]  Today's Date: 02/24/2022 SLP Individual Time: 0830-0900 SLP Individual Time Calculation (min): 30 min   Skilled Therapeutic Interventions:  Skilled treatment session focused on cognitive goals. SLP facilitated session by providing education regarding memory compensatory strategies and how to incorporate strategies at home. Patient verbalized understanding of all information and asking appropriate questions. Multiple physicians present intermittently throughout session and requesting paperwork from patient. Patient able to recall and retrieve paperwork and fill out appropriately and independently. Patient left upright in recliner with all needs within reach.     Patient has met 3 of 3 long term goals.  Patient to discharge at overall Modified Independent level.   Reasons goals not met: N/A   Clinical Impression/Discharge Summary: Patient has made functional gains and has met 3 of 3 LTGs this admission. Currently, patient is overall Mod I for completing mildly complex functional tasks in regards to problem solving and functional recall. Patient and family education is complete and patient will discharge home with 24 hour supervision from family. Suspect patient is at her baseline level of cognitive functioning, therefore, skilled SLP f/u is not warranted at this time.   Care Partner:  Caregiver Able to Provide Assistance: Yes     Recommendation:  None      Equipment: N/A   Reasons for discharge: Discharged from hospital;Treatment goals met   Patient/Family Agrees with Progress Made and Goals Achieved: Yes    [place], [place] 02/24/2022, 6:36 AM

## 2022-02-24 NOTE — Patient Care Conference (Signed)
Inpatient RehabilitationTeam Conference and Plan of Care Update Date: 02/24/2022   Time: 9:16 AM    Patient Name: Lisa Ayala      Medical Record Number: [id number]  Date of Birth: 1966-01-24 Sex: Female         Room/Bed: 4M04C/4M04C-01 Payor Info: Payor: /    Admit Date/Time:  02/21/2022  2:01 PM  Primary Diagnosis:  Anoxic brain injury [facility])  Hospital Problems: Principal Problem:   Anoxic brain injury [facility]) Active Problems:   Atrial flutter ([place])   Severe mitral regurgitation    Expected Discharge Date: Expected Discharge Date: 02/24/22  Team Members Present: Physician leading conference: Dr. Alger Simons Social Worker Present: Loralee Pacas, [place] Nurse Present: Dorthula Nettles, RN PT Present: Canary Brim, PT OT Present: Mariane Masters, OT     Current Status/Progress Goal Weekly Team Focus  Bowel/Bladder   continent b/b  remain continent  discharging home   Swallow/Nutrition/ Hydration             ADL's   independent  independent  DC   Mobility   independent  independent  d/c   Communication             Safety/Cognition/ Behavioral Observations            Pain   no reports of pain  remain pain free  discharging home   Skin   CDI  no breakdown  discharging home     Discharge Planning:  Pt is uninsured. D/c to home with support from her dtr who is currently home from college for the summer. Attending deffered to cardiology for cardiac rehab. [employer] provided shower chair (charity). Pt set up for hospital follow-up thorugh [facility].   Team Discussion: Patient medically stable for discharge. Continent B/B. No reported pain. Discharging home with daughter who will be here until August.   Patient on target to meet rehab goals: yes, Independent goals.  *See Care Plan and progress notes for long and short-term goals.   Revisions to Treatment Plan:  Finalizing discharge plans  Teaching Needs: Family education complete   Current Barriers to  Discharge: No current barriers  Possible Resolutions to Barriers: All barriers addressed     Medical Summary Current Status: anoxic brain injury d/t PEA cardiac arrest. severe MR. drug abuse. has improved to near baseline cognitively  Barriers to Discharge: Medical stability   Possible Resolutions to [employer] Focus: daily assessment of labs, cv paramaters. finalize meds for discharge   Continued Need for Acute Rehabilitation Level of Care: The patient requires daily medical management by a physician with specialized training in physical medicine and rehabilitation for the following reasons: Direction of a multidisciplinary physical rehabilitation program to maximize functional independence : Yes Medical management of patient stability for increased activity during participation in an intensive rehabilitation regime.: Yes Analysis of laboratory values and/or radiology reports with any subsequent need for medication adjustment and/or medical intervention. : Yes   I attest that I was present, lead the team conference, and concur with the assessment and plan of the team.   Cristi Loron 02/24/2022, 9:42 AM

## 2022-02-24 NOTE — Progress Notes (Signed)
Inpatient Rehabilitation Care Coordinator Discharge Note   Patient Details  Name: Lisa Ayala MRN: [id number] Date of Birth: January 20, 1966   Discharge location: D/c to home with pt dtr who will provide care until she returns to college- Aug 2023 [facility])  Length of Stay: 2 days  Discharge activity level: ambulatory level Independent household mobility, supervision community mobility without an AD  Home/community participation: Limited  Patient response YT:KZSWFU Literacy - How often do you need to have someone help you when you read instructions, pamphlets, or other written material from your doctor or pharmacy?: Never  Patient response XN:ATFTDD Isolation - How often do you feel lonely or isolated from those around you?: Often (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.)  Services provided included: MD, RD, PT, OT, SLP, TR, Neuropsych, SW, Pharmacy, CM, RN  Financial Services:  Field seismologist Utilized: Other (Comment) (self pay; Medicaid/ Disablity application pending)    Choices offered to/list presented to: Yes  Follow-up services arranged:  DME, Outpatient    Outpatient Servicies: Deferred to cardiology for cardiac rehab DME : [employer] for shower chair (charity)    Patient response to transportation need: Is the patient able to respond to transportation needs?: Yes In the past 12 months, has lack of transportation kept you from medical appointments or from getting medications?: No In the past 12 months, has lack of transportation kept you from meetings, work, or from getting things needed for daily living?: No   Comments (or additional information):  Patient/Family verbalized understanding of follow-up arrangements:  Yes  Individual responsible for coordination of the follow-up plan: Contact pt dtr [name]  Confirmed correct DME delivered: Gretchen Short 02/24/2022    Gretchen Short

## 2022-02-24 NOTE — Progress Notes (Addendum)
Advanced Heart Failure Rounding Note  PCP-Cardiologist: Will Jorja Loa, MD   Subjective:    Seen in CIR. Progressing favorably. Endurance significantly improved.  No complaints. Denies dyspnea and CP.  Objective:   Weight Range: 64.2 kg Body mass index is 22.17 kg/m.   Vital Signs:   Temp:  [97.7 F (36.5 C)-97.9 F (36.6 C)] 97.7 F (36.5 C) (06/02 0302) Pulse Rate:  [80-101] 101 (06/02 0302) Resp:  [18-20] 18 (06/02 0302) BP: (114-122)/(78-85) 115/85 (06/02 0302) SpO2:  [100 %] 100 % (06/02 0302) Weight:  [64.2 kg] 64.2 kg (06/02 0500) Last BM Date : 02/22/22  Weight change: Filed Weights   02/21/22 1300 02/22/22 0500 02/24/22 0500  Weight: 61.9 kg 64.7 kg 64.2 kg    Intake/Output:   Intake/Output Summary (Last 24 hours) at 02/24/2022 0842 Last data filed at 02/24/2022 0744 Gross per 24 hour  Intake 887 ml  Output --  Net 887 ml      Physical Exam    General:  No distress. Sitting up in chair. HEENT: Normal Neck: Supple. JVP not elevated. Carotids 2+ bilat; no bruits.  Cor: PMI nondisplaced. Regular rate & rhythm. No rubs, gallops or murmurs. Lungs: Clear Abdomen: Soft, nontender, nondistended.  Extremities: No cyanosis, clubbing, rash, edema Neuro: Alert & orientedx3, cranial nerves grossly intact. moves all 4 extremities w/o difficulty. Affect pleasant   Telemetry   N/A  EKG    SR 81 bpm  Labs    CBC Recent Labs    02/22/22 0507 02/24/22 0531  WBC 12.1* 9.3  NEUTROABS 9.3*  --   HGB 10.4* 10.9*  HCT 33.2* 34.5*  MCV 91.0 90.3  PLT 560* 674*   Basic Metabolic Panel Recent Labs    77/41/28 1549 02/22/22 0507  NA 137 138  K 4.8 4.1  CL 107 111  CO2 19* 21*  GLUCOSE 148* 117*  BUN 14 13  CREATININE 1.15* 1.15*  CALCIUM 9.1 8.7*  MG  --  1.8   Liver Function Tests Recent Labs    02/22/22 0507  AST 21  ALT 33  ALKPHOS 89  BILITOT 0.7  PROT 6.1*  ALBUMIN 2.3*   No results for input(s): LIPASE, AMYLASE in the  last 72 hours. Cardiac Enzymes No results for input(s): CKTOTAL, CKMB, CKMBINDEX, TROPONINI in the last 72 hours.  BNP: BNP (last 3 results) Recent Labs    07/02/21 1335 02/08/22 0351  BNP 1,157.2* 709.9*    ProBNP (last 3 results) No results for input(s): PROBNP in the last 8760 hours.   D-Dimer No results for input(s): DDIMER in the last 72 hours. Hemoglobin A1C No results for input(s): HGBA1C in the last 72 hours. Fasting Lipid Panel No results for input(s): CHOL, HDL, LDLCALC, TRIG, CHOLHDL, LDLDIRECT in the last 72 hours. Thyroid Function Tests No results for input(s): TSH, T4TOTAL, T3FREE, THYROIDAB in the last 72 hours.  Invalid input(s): FREET3  Other results:   Imaging    No results found.   Medications:     Scheduled Medications:  amiodarone  200 mg Oral BID   apixaban  5 mg Oral BID   chlorhexidine  15 mL Mouth Rinse BID   docusate  100 mg Oral BID   feeding supplement  237 mL Oral BID BM   magnesium oxide  400 mg Oral BID   multivitamin with minerals  1 tablet Oral Daily   pantoprazole  40 mg Oral QHS   polyethylene glycol  17 g Oral Daily  QUEtiapine  50 mg Oral QHS   sacubitril-valsartan  1 tablet Oral BID   spironolactone  12.5 mg Oral Daily   traZODone  100 mg Oral QHS    Infusions:   PRN Medications: acetaminophen, alum & mag hydroxide-simeth, bisacodyl, diphenhydrAMINE, guaiFENesin-dextromethorphan, polyethylene glycol, prochlorperazine **OR** prochlorperazine **OR** prochlorperazine, sodium phosphate    Patient Profile   56 y/o woman with severe MR and polysubstance abuse.  Lost to f/u. Admitted with PAF/SVT -> cardiogenic shock.  Discharged to CIR on 05/30.  Assessment/Plan   1. Shock, cardiogenic - in setting of severe MR and flash pulmonary edema. Lactic acid 2.6>1.9  - component of septic shock due to PNA. Respiratory + Group C Strep - Resolved  2. HFpEF - ECHO EF 65-70%, Grade III DD, R mildly reduced, severe MR  and rheumatic - Volume looks good today. No need for diuretic. - Add farxiga 10 mg daily - Continue spiro 12.5 mg daily - Continue entresto 24/26 mg BID   3. PEA Arrest on 5/17 prior to TEE attempt -ACLS x1 min with ROSC -Now recovered    4.  Acute Hypoxic Respiratory Failure due to flash pulmonary edema/PNA -Urgent intubation 5/17, extubated 05/23. Off supplemental O2. O2 stable on RA. -Green sputum with fever, leukocytosis. PCT 4 => Completed Zosyn   -Respiratory Cx + Group C Strep.  -Resolved.   5.  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  - Seen by [facility]. Plan MVR once recovered from acute illness. Has outpatient f/u with [facility] later this month - significant improvement in function status with CIR.   6 . PAF/SVT - A flutter on admit. Spontaneously converted to SR.  - Multiple episodes of AF during hospitalization. - SR on ECG today - Continue po amio/Eliquis - Planning MAZE at time of MVR.    7.  AKI due to ATN/shock - resolved - Scr stable 1.15 on 05/31   8. Tobacco/Polysubstance abuse  - tox screen +cocaine/THC   9. Hypokalemia/hypomagnesemia - K 4.1, resolved - Mag 1.8 on 05/31. Will supp prior to discharge.   10. Anxiety - severe    11. ID/PNA - Respiratory Cx + Group C Strep. Completed  zosyn 5/20->5/27 - WBC trending back down. Encourage pulmonary toilet    12. Hypertension - BP stable - Continue Spiro 12.5 mg daily  - Continue Entresto 24/26 mg BID   Discharging home today from CIR. HF meds to TOC. Will need HF fund and patient assistance for Eliquis + Entresto.   Will arrange f/u in HF clinic.  HF medications at discharge: -Amiodarone 200 mg BID -Eliquis 5mg  BID -Spiro 12.5 mg daily -Entresto 24/26 mg BID -Farxiga 10 mg daily   Length of Stay: 3  FINCH, LINDSAY N, PA-C  02/24/2022, 8:42 AM  Advanced  Heart Failure Team Pager [phone] (M-F; 7a - 5p)  Please contact [facility] for night-coverage after hours (5p -7a ) and weekends on [email or web address]   Patient seen and examined with the above-signed Advanced Practice Provider and/or Housestaff. I personally reviewed laboratory data, imaging studies and relevant notes. I independently examined the patient and formulated the important aspects of the plan. I have edited the note to reflect any of my changes or salient points. I have personally discussed the plan with the patient and/or family.  She looks good today. Denies sob, orthopnea or  PND. Maintaining NSR. BP stable/improved. Labs good.   General:  Well appearing. No resp difficulty HEENT: normal Neck: supple. no JVD. Carotids 2+ bilat; no bruits. No lymphadenopathy or thryomegaly appreciated. Cor: PMI nondisplaced. Regular rate & rhythm. 3/6 MR Lungs: clear Abdomen: soft, nontender, nondistended. No hepatosplenomegaly. No bruits or masses. Good bowel sounds. Extremities: no cyanosis, clubbing, rash, edema Neuro: alert & orientedx3, cranial nerves grossly intact. moves all 4 extremities w/o difficulty. Affect pleasant  She is ok for d/c today. D/c meds reviewed personally. She has f/u with [facility] but I suspect best plan may be to have her see Dr. Cheree Ditto at [facility] for MVR via mini-thoracotomy approach. Will discuss that option with her and her daughter. Otherwise can have MVR with Dr. Roxan Hockey via traditional sternotomy.  Glori Bickers, MD  5:41 PM

## 2022-02-27 ENCOUNTER — Telehealth (HOSPITAL_COMMUNITY): Payer: Self-pay | Admitting: Licensed Clinical Social Worker

## 2022-02-27 NOTE — Progress Notes (Signed)
Inpatient Rehabilitation Discharge Medication Review by a Pharmacist  A complete drug regimen review was completed for this patient to identify any potential clinically significant medication issues.  High Risk Drug Classes Is patient taking? Indication by Medication  Antipsychotic Yes Seroquel for agitation  Anticoagulant Yes Eliquis for Afib  Antibiotic No   Opioid No   Antiplatelet No   Hypoglycemics/insulin No Dapagliflozin: CHF  Vasoactive Medication Yes Entresto, spiro for CHF Amiodarone for Afib  Chemotherapy No   Other Yes Protonix for GERD Docusate, miralax for constipation  Trazodone for sleep     Type of Medication Issue Identified Description of Issue Recommendation(s)  Drug Interaction(s) (clinically significant)     Duplicate Therapy     Allergy     No Medication Administration End Date     Incorrect Dose     Additional Drug Therapy Needed     Significant med changes from prior encounter (inform family/care partners about these prior to discharge). Metoprolol, aspirin discontinued from inpatient admission Educate patient/family upon discharge of change in medications  Other       Clinically significant medication issues were identified that warrant physician communication and completion of prescribed/recommended actions by midnight of the next day:  No  Time spent performing this drug regimen review (minutes):  20 minutes   Thank you for allowing pharmacy to be a part of this patient's care.  Thelma Barge, PharmD Clinical Pharmacist

## 2022-02-27 NOTE — Telephone Encounter (Signed)
CSW received request from inpatient CSW to follow up with patient in the clinic re: insurance. CSW contacted patient to discuss follow up appointment and to bring any paperwork from social services regarding the status of her Medicaid. Patient verbalizes understanding. Lasandra Beech, LCSW, CCSW-MCS [phone]

## 2022-02-28 ENCOUNTER — Telehealth (HOSPITAL_COMMUNITY): Payer: Self-pay | Admitting: Pharmacy Technician

## 2022-02-28 ENCOUNTER — Other Ambulatory Visit (HOSPITAL_COMMUNITY): Payer: Self-pay

## 2022-02-28 MED ORDER — SACUBITRIL-VALSARTAN 24-26 MG PO TABS: 1.0000 | ORAL_TABLET | Freq: Two times a day (BID) | ORAL | 3 refills | Status: DC

## 2022-02-28 NOTE — Telephone Encounter (Signed)
Advanced Heart Failure Patient Advocate Encounter  Sent in [employer] application and POI via fax. Document scanned to chart.

## 2022-02-28 NOTE — Telephone Encounter (Signed)
Advanced Heart Failure Patient Advocate Encounter ? ?Sent in [employer] application via fax.Document scanned to chart.  ? ?Will follow up. ? ?

## 2022-03-01 ENCOUNTER — Encounter (HOSPITAL_COMMUNITY): Payer: Self-pay | Admitting: Physical Medicine & Rehabilitation

## 2022-03-01 DIAGNOSIS — I5032 Chronic diastolic (congestive) heart failure: Secondary | ICD-10-CM

## 2022-03-02 NOTE — Telephone Encounter (Signed)
Advanced Heart Failure Patient Advocate Encounter   Patient was approved to receive Eliquis from [employer]. Document scanned to chart.   Effective dates: 03/01/22 through 03/01/23  Called and spoke with the patient. Provided phone number to both [employer] and [employer].  Archer Asa, CPhT

## 2022-03-02 NOTE — Telephone Encounter (Signed)
Advanced Heart Failure Patient Advocate Encounter   Patient was approved to receive Entresto from [employer]  Effective dates: 03/02/22 through 03/03/23  Document scanned to chart.

## 2022-03-03 ENCOUNTER — Ambulatory Visit (INDEPENDENT_AMBULATORY_CARE_PROVIDER_SITE_OTHER): Payer: Medicaid Other | Admitting: Nurse Practitioner

## 2022-03-03 ENCOUNTER — Encounter: Payer: Self-pay | Admitting: Nurse Practitioner

## 2022-03-03 VITALS — BP 134/82 | HR 86 | Resp 18

## 2022-03-03 DIAGNOSIS — G931 Anoxic brain damage, not elsewhere classified: Secondary | ICD-10-CM | POA: Diagnosis not present

## 2022-03-03 DIAGNOSIS — I5033 Acute on chronic diastolic (congestive) heart failure: Secondary | ICD-10-CM | POA: Diagnosis not present

## 2022-03-03 DIAGNOSIS — J9601 Acute respiratory failure with hypoxia: Secondary | ICD-10-CM

## 2022-03-03 NOTE — Patient Instructions (Addendum)
1. Acute on chronic diastolic (congestive) heart failure (HCC)  - Ambulatory referral to Cardiology  2. Anoxic brain injury (HCC)   3. Acute respiratory failure with hypoxia (HCC)  Heart healthy diet   Follow up:  Follow up as scheduled

## 2022-03-03 NOTE — Assessment & Plan Note (Signed)
-   Ambulatory referral to Cardiology  2. Anoxic brain injury ([place])   3. Acute respiratory failure with hypoxia (HCC)  Heart healthy diet   Follow up:  Follow up as scheduled

## 2022-03-03 NOTE — Progress Notes (Signed)
@Patient  ID: Lisa Ayala, female    DOB: 05/03/1966, 56 y.o.   MRN: [id number]  Chief Complaint  Patient presents with   Hospitalization Follow-up    Referring provider: No ref. provider found  Recent Significant Events:  Hospital Admission: 02/21/22  Hospital Course: Lisa Ayala was admitted to rehab 02/21/2022 for inpatient therapies to consist of PT, ST and OT at least three hours five days a week. Past admission physiatrist, therapy team and rehab RN have worked together to provide customized collaborative inpatient rehab.  Of CBC showed H&H to be relatively stable and leukocytosis had resolved.  She has been afebrile during her stay and respiratory status has been stable.  Mild AKI noted and stable.  Her blood pressures were monitored on TID basis and have been stable.  No cardiac symptoms reported with increase in activity.  No signs of overload noted.  Dapagliflozin was added at discharge per cardiology recommendations.  She continues on trazodone to help manage sleep-wake disruption.  She made great gains during her rehab stay and is modified independent.  No further therapy recommended at this time.  She is to follow-up with cardiology for input on cardiac rehab.  She was discharged to home in improved condition.      Rehab course: During patient's stay in rehab team conference was held to discuss patient's progress, set goals and barriers to discharge. At admission, patient required supervision with mobility and basic ADL tasks. Cognistat was [facility] with exception of one-point below normal for short-term recall with reports of mild memory change. She she is able to complete ADL tasks at modified independent level.  She is modified independent for transfers and is able to ambulate 1400 feet without rest breaks.  She is able to complete mildly complex for functional tasks at modified independent level.  Daughter will provide supervision and assistance as needed after  discharge.   HPI  Lisa Ayala is a 56 y.o. female with history of PSVT, severe MR, moderate TR and mild to moderate AI, anxiety disorder, regular marijuana use, cocaine use.   Patient presents today for a hospital follow-up.  Please see hospital notes above.  Patient states that she has been doing well since hospital discharge.  Her daughter is here with her today and is helping care for her.  Patient is ambulating independently.  She states that she was able to get all of her medications at hospital discharge and has been taking them as directed.  Patient does have her follow-up appointments already scheduled.  She will be seeing cardiology next week and she also has an appointment to [facility].  Patient also has an appointment scheduled with cardiothoracic surgery for further evaluation and possible work-up for mitral valve replacement. Denies f/c/s, n/v/d, hemoptysis, PND, leg swelling Denies chest pain or edema   Taking medications as directed  Mitral valve replacement  Waking routine    No Known Allergies   There is no immunization history on file for this patient.  Past Medical History:  Diagnosis Date   Aortic insufficiency    Atrial flutter ([place]) 02/07/2022   PSVT (paroxysmal supraventricular tachycardia) ([facility])    Severe mitral regurgitation    a. 08/2021 Echo: EF 70-75%, no rwma, mod dil LV, GrII DD, nl RV fxn, massively dil LA, mild-mod dil RA, severe MR due to lack of central coaptation of MV leaflets. Mild MS. Mod TR. Mild-mod AI.  Tricuspid regurgitation     Tobacco History: Social History   Tobacco Use  Smoking Status Never  Smokeless Tobacco Never   Counseling given: Not Answered   Outpatient Encounter Medications as of 03/03/2022  Medication Sig   amiodarone (PACERONE) 200 MG tablet Take 1 tablet (200 mg total) by mouth 2 (two) times daily.   apixaban (ELIQUIS) 5 MG TABS  tablet Take 1 tablet (5 mg total) by mouth 2 (two) times daily.   dapagliflozin propanediol (FARXIGA) 10 MG TABS tablet Take 1 tablet (10 mg total) by mouth daily.   docusate (COLACE) 50 MG/5ML liquid Take 10 mLs (100 mg total) by mouth 2 (two) times daily.   magnesium oxide (MAG-OX) 400 MG tablet Take 1 tablet (400 mg total) by mouth 2 (two) times daily.   Multiple Vitamins-Minerals (CERTAVITE/ANTIOXIDANTS) TABS Take 1 tablet by mouth daily.   pantoprazole (PROTONIX) 40 MG tablet Take 1 tablet (40 mg total) by mouth at bedtime.   polyethylene glycol powder (GLYCOLAX/MIRALAX) 17 GM/SCOOP powder Take 17 g by mouth daily dissolved in water.   QUEtiapine (SEROQUEL) 25 MG tablet Take 1 tablet (25 mg total) by mouth at bedtime as needed (sleep).   sacubitril-valsartan (ENTRESTO) 24-26 MG Take 1 tablet by mouth 2 (two) times daily.   spironolactone (ALDACTONE) 25 MG tablet Take 1/2 tablet (12.5 mg total) by mouth daily.   traZODone (DESYREL) 100 MG tablet Take 1 tablet (100 mg total) by mouth at bedtime.   No facility-administered encounter medications on file as of 03/03/2022.     Review of Systems  Review of Systems  Constitutional: Negative.   HENT: Negative.    Cardiovascular: Negative.   Gastrointestinal: Negative.   Allergic/Immunologic: Negative.   Neurological: Negative.   Psychiatric/Behavioral: Negative.         Physical Exam  BP 134/82   Pulse 86   Resp 18   SpO2 100%   Wt Readings from Last 5 Encounters:  02/24/22 141 lb 8.6 oz (64.2 kg)  02/21/22 138 lb 10.7 oz (62.9 kg)  02/07/22 150 lb 3.2 oz (68.1 kg)  07/26/21 139 lb (63 kg)     Physical Exam Vitals and nursing note reviewed.  Constitutional:      General: She is not in acute distress.    Appearance: She is well-developed.  Cardiovascular:     Rate and Rhythm: Normal rate and regular rhythm.  Pulmonary:     Effort: Pulmonary effort is normal.     Breath sounds: Normal breath sounds.  Neurological:      Mental Status: She is alert and oriented to person, place, and time.      Lab Results:  CBC    Component Value Date/Time   WBC 9.3 02/24/2022 0531   RBC 3.82 (L) 02/24/2022 0531   HGB 10.9 (L) 02/24/2022 0531   HGB 11.2 09/02/2021 0908   HCT 34.5 (L) 02/24/2022 0531   HCT 34.3 09/02/2021 0908   PLT 674 (H) 02/24/2022 0531   PLT 296 09/02/2021 0908   MCV 90.3 02/24/2022 0531   MCV 88 09/02/2021 0908   MCH 28.5 02/24/2022 0531   MCHC 31.6 02/24/2022 0531   RDW 18.8 (H) 02/24/2022 0531   RDW 14.9 09/02/2021 0908   LYMPHSABS 1.4 02/22/2022 0507   MONOABS 0.7 02/22/2022 0507   EOSABS 0.3 02/22/2022 0507   BASOSABS 0.1 02/22/2022 0507    BMET    Component Value Date/Time   NA 138 02/22/2022 0507   NA 140 09/02/2021 0908  K 4.1 02/22/2022 0507   CL 111 02/22/2022 0507   CO2 21 (L) 02/22/2022 0507   GLUCOSE 117 (H) 02/22/2022 0507   BUN 13 02/22/2022 0507   BUN 19 09/02/2021 0908   CREATININE 1.15 (H) 02/22/2022 0507   CALCIUM 8.7 (L) 02/22/2022 0507   GFRNONAA 56 (L) 02/22/2022 0507    BNP    Component Value Date/Time   BNP 709.9 (H) 02/08/2022 0351    ProBNP No results found for: "PROBNP"  Imaging: ECHO TEE  Result Date: 02/21/2022    TRANSESOPHOGEAL ECHO REPORT   Patient Name:   IMONA BROMBERG [facility] Date of Exam: 02/09/2022 Medical Rec #:  [id number]       Height:       67.0 in Accession #:    [id number]      Weight:       148.6 lb Date of Birth:  [birth year]        BSA:          1.782 m Patient Age:    39 years        BP:           140/73 mmHg Patient Gender: F               HR:           67 bpm. Exam Location:  Inpatient Procedure: 2D Echo, Color Doppler and Cardiac Doppler Indications:     Mitral Regurgitation  History:         Patient has prior history of Echocardiogram examinations, most                  recent 02/08/2022. Arrythmias:Atrial Flutter.  Sonographer:     Raquel Sarna Senior RDCS Referring Phys:  [phone] AMY D CLEGG Diagnosing Phys: Glori Bickers MD PROCEDURE:  After discussion of the risks and benefits of a TEE, an informed consent was obtained from the patient. The transesophogeal probe was passed without difficulty through the esophogus of the patient. Sedation performed by performing physician. The patient developed no complications during the procedure. IMPRESSIONS  1. Left ventricular ejection fraction, by estimation, is 60 to 65%. The left ventricle has normal function. The left ventricle has no regional wall motion abnormalities.  2. Right ventricular systolic function is mildly reduced. The right ventricular size is normal.  3. Left atrial size was severely dilated. No left atrial/left atrial appendage thrombus was detected.  4. A small pericardial effusion is present. The pericardial effusion is localized near the right atrium and anterior to the right ventricle.  5. 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  6. The aortic valve is tricuspid. Aortic valve regurgitation is mild.  7. There is mild (Grade II) plaque involving the descending aorta. FINDINGS  Left Ventricle: Left ventricular ejection fraction, by estimation, is 60 to 65%. The left ventricle has normal function. The left ventricle has no regional wall motion abnormalities. The left ventricular internal cavity size was normal in size. Right Ventricle: The right ventricular size is normal. No increase in right ventricular wall thickness. Right ventricular systolic function is mildly reduced. Left Atrium: Left atrial size was severely dilated. No left atrial/left atrial appendage thrombus was detected. Right Atrium: Right atrial size was normal in size. Pericardium: A small pericardial effusion is present. The pericardial effusion is localized near the right atrium and anterior to the right ventricle. Mitral Valve: The mitral valve is rheumatic. There is moderate thickening of the mitral valve leaflet(s). Moderately decreased mobility  of the mitral valve leaflets. Severe mitral  valve regurgitation, with centrally-directed jet. Tricuspid Valve: The tricuspid valve is normal in structure. Tricuspid valve regurgitation is mild. Aortic Valve: The aortic valve is tricuspid. Aortic valve regurgitation is mild. Pulmonic Valve: The pulmonic valve was grossly normal. Pulmonic valve regurgitation is not visualized. Aorta: The aortic root is normal in size and structure. There is mild (Grade II) plaque involving the descending aorta. IAS/Shunts: No atrial level shunt detected by color flow Doppler.  MR Peak grad:    219.0 mmHg MR Mean grad:    103.0 mmHg MR Vmax:         740.00 cm/s MR Vmean:        455.0 cm/s MR PISA:         3.08 cm MR PISA Eff ROA: 16 mm MR PISA Radius:  0.70 cm Glori Bickers MD Electronically signed by Glori Bickers MD Signature Date/Time: 02/21/2022/10:34:17 AM    Final    US Abdomen Limited RUQ (LIVER/GB)  Result Date: 02/17/2022 CLINICAL DATA:  Cholecystitis. EXAM: ULTRASOUND ABDOMEN LIMITED RIGHT UPPER QUADRANT COMPARISON:  None Available. FINDINGS: Gallbladder: No gallstones or wall thickening visualized. No sonographic Murphy sign noted by sonographer. Common bile duct: Diameter: 6 mm. Upper limits of normal. No intrahepatic bile duct dilatation. Liver: No focal lesion identified. Within normal limits in parenchymal echogenicity. Portal vein is patent on color Doppler imaging with normal direction of blood flow towards the liver. Other: None. IMPRESSION: 1. No acute findings. No evidence for cholecystitis. 2. Common bile duct is upper limits of normal measuring 6 mm. Electronically Signed   By: Kerby Moors M.D.   On: 02/17/2022 15:13   DG Abd 1 View  Result Date: 02/17/2022 CLINICAL DATA:  Ileus. EXAM: ABDOMEN - 1 VIEW COMPARISON:  Radiograph 01/12/2022 FINDINGS: Feeding 2 with tip in the proximal duodenum. Increasing gaseous distention of the colon. Transverse colon measures 6 cm which is within normal limits. There is gas in the rectum. IMPRESSION: 1.  Feeding tube in first portion duodenum. 2. Gas distended colon without evidence obstruction. Electronically Signed   By: Suzy Bouchard M.D.   On: 02/17/2022 14:25   VAS US DOPPLER PRE CABG  Result Date: 02/15/2022 PREOPERATIVE VASCULAR EVALUATION Patient Name:  SYRI ZENGER [facility]  Date of Exam:   02/15/2022 Medical Rec #: [id number]        Accession #:    [id number] Date of Birth: 05-Oct-1965         Patient Gender: F Patient Age:   24 years Exam Location:  [facility] Procedure:      VAS US DOPPLER PRE CABG Referring Phys: Remo Lipps HENDRICKSON --------------------------------------------------------------------------------  Indications:      Pre-CABG. Other Factors:    MV disease. Limitations:      Right bandaging/line Comparison Study: No prior study Performing Technologist: Maudry Mayhew MHA, RDMS, RVT, RDCS  Examination Guidelines: A complete evaluation includes B-mode imaging, spectral Doppler, color Doppler, and power Doppler as needed of all accessible portions of each vessel. Bilateral testing is considered an integral part of a complete examination. Limited examinations for reoccurring indications may be performed as noted.  Right Carotid Findings: +----------+--------+--------+--------+--------+--------+           PSV cm/sEDV cm/sStenosisDescribeComments +----------+--------+--------+--------+--------+--------+ CCA Prox  57      13                               +----------+--------+--------+--------+--------+--------+ ICA  Prox  62      11                               +----------+--------+--------+--------+--------+--------+ ICA Distal63      16                               +----------+--------+--------+--------+--------+--------+ +----------+--------+-------+----------------+------------+           PSV cm/sEDV cmsDescribe        Arm Pressure +----------+--------+-------+----------------+------------+ Subclavian120            Multiphasic, WNL              +----------+--------+-------+----------------+------------+ +---------+--------+---+--------+--+---------+ VertebralPSV cm/s103EDV cm/s17Antegrade +---------+--------+---+--------+--+---------+ Left Carotid Findings: +----------+--------+-------+--------+----------------------+------------------+           PSV cm/sEDV    StenosisDescribe              Comments                             cm/s                                                    +----------+--------+-------+--------+----------------------+------------------+ CCA Prox  117     16                                                      +----------+--------+-------+--------+----------------------+------------------+ CCA Distal81      22                                   intimal thickening +----------+--------+-------+--------+----------------------+------------------+ ICA Prox  47      12             heterogenous and                                                          smooth                                   +----------+--------+-------+--------+----------------------+------------------+ ICA Distal62      24                                                      +----------+--------+-------+--------+----------------------+------------------+ ECA       55      8                                                       +----------+--------+-------+--------+----------------------+------------------+ +----------+--------+--------+----------------+------------+  SubclavianPSV cm/sEDV cm/sDescribe        Arm Pressure +----------+--------+--------+----------------+------------+           163             Multiphasic, WNL             +----------+--------+--------+----------------+------------+ +---------+--------+--+--------+--+---------+ VertebralPSV cm/s53EDV cm/s19Antegrade +---------+--------+--+--------+--+---------+  ABI Findings:  +--------+------------------+-----+---------+--------+ Right   Rt Pressure (mmHg)IndexWaveform Comment  +--------+------------------+-----+---------+--------+ Brachial                       triphasic         +--------+------------------+-----+---------+--------+ +--------+------------------+-----+---------+-------+ Left    Lt Pressure (mmHg)IndexWaveform Comment +--------+------------------+-----+---------+-------+ [id number]                    triphasic        +--------+------------------+-----+---------+-------+  Right Doppler Findings: +-----------+--------+-----+---------+-----------------------------------------+ Site       PressureIndexDoppler  Comments                                  +-----------+--------+-----+---------+-----------------------------------------+ Brachial                triphasic                                          +-----------+--------+-----+---------+-----------------------------------------+ Radial                  triphasic                                          +-----------+--------+-----+---------+-----------------------------------------+ Ulnar                   triphasic                                          +-----------+--------+-----+---------+-----------------------------------------+ Palmar Arch                      Signal obliterates with radial                                             compression, is unaffected with ulnar                                      comperssion                               +-----------+--------+-----+---------+-----------------------------------------+  Left Doppler Findings: +-----------+--------+-----+---------+--------------------+ Site       PressureIndexDoppler  Comments             +-----------+--------+-----+---------+--------------------+ Brachial   125          triphasic                     +-----------+--------+-----+---------+--------------------+  Radial  triphasic                     +-----------+--------+-----+---------+--------------------+ Ulnar                   triphasic                     +-----------+--------+-----+---------+--------------------+ Palmar Arch                      Within normal limits +-----------+--------+-----+---------+--------------------+  Summary: Right Carotid: Velocities in the right ICA are consistent with a 1-39% stenosis.                Limited visualization secondary to bandaging. Left Carotid: Velocities in the left ICA are consistent with a 1-39% stenosis. Vertebrals:  Bilateral vertebral arteries demonstrate antegrade flow. Subclavians: Normal flow hemodynamics were seen in bilateral subclavian              arteries.  Electronically signed by Harold Barban MD on 02/15/2022 at 8:52:49 PM.    Final    VAS [place] UPPER EXTREMITY VENOUS DUPLEX  Result Date: 02/15/2022 UPPER VENOUS STUDY  Patient Name:  SHEALA GODING [facility]  Date of Exam:   02/15/2022 Medical Rec #: [id number]        Accession #:    [id number] Date of Birth: [birth year]         Patient Gender: F Patient Age:   31 years Exam Location:  [facility] Procedure:      VAS [place] UPPER EXTREMITY VENOUS DUPLEX Referring Phys: Ina Homes --------------------------------------------------------------------------------  Indications: Edema Comparison Study: No prior study Performing Technologist: Maudry Mayhew MHA, RDMS, RVT, RDCS  Examination Guidelines: A complete evaluation includes B-mode imaging, spectral Doppler, color Doppler, and power Doppler as needed of all accessible portions of each vessel. Bilateral testing is considered an integral part of a complete examination. Limited examinations for reoccurring indications may be performed as noted.  Right Findings: +----------+------------+---------+-----------+----------+------------+ RIGHT     CompressiblePhasicitySpontaneousProperties  Summary     +----------+------------+---------+-----------+----------+------------+ IJV                                Yes              line present +----------+------------+---------+-----------+----------+------------+ Subclavian    Full       Yes       Yes                           +----------+------------+---------+-----------+----------+------------+ Axillary      Full       Yes       Yes                           +----------+------------+---------+-----------+----------+------------+ Brachial      Full       Yes       Yes                           +----------+------------+---------+-----------+----------+------------+ Radial        Full                                               +----------+------------+---------+-----------+----------+------------+  Ulnar         Full                                               +----------+------------+---------+-----------+----------+------------+ Cephalic      Full                                               +----------+------------+---------+-----------+----------+------------+ Basilic       None                                     Acute     +----------+------------+---------+-----------+----------+------------+  Left Findings: +----------+------------+---------+-----------+----------+-------+ LEFT      CompressiblePhasicitySpontaneousPropertiesSummary +----------+------------+---------+-----------+----------+-------+ Subclavian               Yes       Yes                      +----------+------------+---------+-----------+----------+-------+  Summary:  Right: No evidence of deep vein thrombosis in the upper extremity. Findings consistent with acute superficial vein thrombosis involving the right basilic vein.  Left: No evidence of thrombosis in the subclavian.  *See table(s) above for measurements and observations.  Diagnosing physician: Harold Barban MD Electronically signed by Harold Barban MD on 02/15/2022  at 8:52:39 PM.    Final    CT HEAD WO CONTRAST (5MM)  Result Date: 02/15/2022 CLINICAL DATA:  Altered mental status. EXAM: CT HEAD WITHOUT CONTRAST TECHNIQUE: Contiguous axial images were obtained from the base of the skull through the vertex without intravenous contrast. RADIATION DOSE REDUCTION: This exam was performed according to the departmental dose-optimization program which includes automated exposure control, adjustment of the mA and/or kV according to patient size and/or use of iterative reconstruction technique. COMPARISON:  None Available. FINDINGS: Brain: No evidence of acute infarction, hemorrhage, hydrocephalus, extra-axial collection or mass lesion/mass effect. Focal area of low density within the left cerebellar hemisphere is identified, image 7/3. This is suspicious for subacute to chronic infarct. Vascular: No hyperdense vessel or unexpected calcification. Skull: Normal. Negative for fracture or focal lesion. Sinuses/Orbits: No acute finding. Other: None. IMPRESSION: 1. No acute intracranial abnormalities. 2. Focal area of low density within the left cerebellar hemisphere is suspicious for subacute to chronic infarct. Electronically Signed   By: Kerby Moors M.D.   On: 02/15/2022 13:20   DG Abd Portable 1V  Result Date: 02/15/2022 CLINICAL DATA:  Feeding tube placement EXAM: PORTABLE ABDOMEN - 1 VIEW COMPARISON:  02/16/2020 FINDINGS: Feeding tube enters the stomach with the tip near the gastric antrum. Normal bowel gas pattern Cardiac enlargement. IMPRESSION: Feeding tube tip gastric antrum. Electronically Signed   By: Franchot Gallo M.D.   On: 02/15/2022 13:17   DG Abd 1 View  Result Date: 02/15/2022 CLINICAL DATA:  Ileus EXAM: ABDOMEN - 1 VIEW COMPARISON:  Portable exam 0820 hours compared to 02/08/2022 FINDINGS: Nonobstructive bowel gas pattern. No bowel dilatation or bowel wall thickening. Improved atelectasis versus consolidation LEFT lower lobe. No urinary tract calcification.  Levoconvex thoracolumbar scoliosis. IMPRESSION: No acute abnormalities. Electronically Signed   By: Lavonia Dana M.D.   On: 02/15/2022 08:27   DG CHEST PORT  1 VIEW  Result Date: 02/13/2022 CLINICAL DATA:  Endotracheal tube placement. EXAM: PORTABLE CHEST 1 VIEW COMPARISON:  Feb 12, 2022. FINDINGS: Stable cardiomegaly. Endotracheal and nasogastric tubes are in grossly good position. Right internal jugular catheter is in good position. Bibasilar atelectasis or edema is noted with associated pleural effusions. Bony thorax is unremarkable. IMPRESSION: Stable support apparatus. Stable bibasilar atelectasis or edema is noted with associated pleural effusions. Electronically Signed   By: Marijo Conception M.D.   On: 02/13/2022 09:23   DG CHEST PORT 1 VIEW  Result Date: 02/12/2022 CLINICAL DATA:  ETT assessment EXAM: PORTABLE CHEST 1 VIEW COMPARISON:  Feb 11, 2022 FINDINGS: The ETT terminates 3 cm above the carina. The right central line terminates in the central SVC. The NG tube terminates below today's film. No pneumothorax. Bilateral effusions, right greater than left. Atelectasis underlying the effusions. Mild interstitial prominence without overt edema. No change in the cardiomediastinal silhouette which is partially obscured by transcutaneous pacer leads. IMPRESSION: 1. Support apparatus as above. 2. Right greater than left pleural effusions with underlying atelectasis. 3. Mild pulmonary venous congestion. Electronically Signed   By: Dorise Bullion III M.D.   On: 02/12/2022 08:20   DG CHEST PORT 1 VIEW  Result Date: 02/11/2022 CLINICAL DATA:  56 year old female intubated. Cardiogenic shock. EXAM: PORTABLE CHEST 1 VIEW COMPARISON:  Portable chest 02/08/2022 and earlier. FINDINGS: Portable AP semi upright view at 0734 hours. Stable right IJ central line. ETT tip in good position between the clavicles and carina. Enteric tube courses to the abdomen, tip not included. Pacer or resuscitation pads now project over  the chest. Ongoing cardiomegaly, cardiac contour raising the possibility of pericardial effusion. Other mediastinal contours are within normal limits. No pneumothorax. Regressed but not resolved diffuse pulmonary interstitial opacity, apical pulmonary vascularity now appears normal. Continued veiling and confluent opacity at both lung bases. IMPRESSION: 1. Satisfactory visible lines and tubes. 2. Cardiomegaly, consider pericardial effusion. 3. Regressed but not resolved pulmonary interstitial opacity, favor regressed pulmonary edema. 4. Continued bibasilar opacity which probably reflects combination of pleural effusions and lower lobe collapse or consolidation. Electronically Signed   By: Genevie Ann M.D.   On: 02/11/2022 08:00   CARDIAC CATHETERIZATION  Result Date: 02/10/2022   Ost RCA to Prox RCA lesion is 20% stenosed.   Prox RCA to Mid RCA lesion is 40% stenosed.   Mid LAD lesion is 40% stenosed.   The left ventricular ejection fraction is 55-65% by visual estimate. Findings: Ao = 139/74 LV = 136/12 RA = 9 RV = 46/10 PA = 50/10 (26) PCW = 12 (no significant v waves) Fick cardiac output/index = 12.2/6.8 PVR = 1.8 WU Ao sat = 99% PA sat = 79%, 80% SVC sat = 85% Assessment: 1. Mild non-obstructive CAD 2. EF 60-65% 3. Severe MR by echo. No significant v-waves in PCWP tracing 4. High cardiac output - may be overestimated by Fick Plan/Discussion: Will plan to wean vent. TCTS consulted for MVR. Glori Bickers, MD 4:27 PM  ECHOCARDIOGRAM COMPLETE  Result Date: 02/08/2022    ECHOCARDIOGRAM REPORT   Patient Name:   [facility] Date of Exam: 02/08/2022 Medical Rec #:  [id number]       Height:       67.0 in Accession #:    [id number]      Weight:       150.2 lb Date of Birth:  1966/07/17        BSA:  1.791 m Patient Age:    77 years        BP:           124/91 mmHg Patient Gender: F               HR:           69 bpm. Exam Location:  Inpatient Procedure: 2D Echo, Cardiac Doppler and Color Doppler                            STAT ECHO Reported to: Dr Buford Dresser on 02/08/2022 5:30:00 PM. Indications:    Mitral valve insufficiency  History:        Patient has prior history of Echocardiogram examinations, most                 recent 08/25/2021. CHF, Mitral Valve Disease; Arrythmias:Atrial                 Flutter.  Sonographer:    Clayton Lefort RDCS (AE) Referring Phys: [id number] [place]  Sonographer Comments: Echo performed with patient supine and on artificial respirator. IMPRESSIONS  1. Left ventricular ejection fraction, by estimation, is 65 to 70%. The left ventricle has normal function. The left ventricle has no regional wall motion abnormalities. There is moderate concentric left ventricular hypertrophy. Left ventricular diastolic parameters are consistent with Grade III diastolic dysfunction (restrictive).  2. Right ventricular systolic function is mildly reduced. The right ventricular size is normal. There is mildly elevated pulmonary artery systolic pressure.  3. Left atrial size was severely dilated.  4. Right atrial size was mildly dilated.  5. A small pericardial effusion is present.  6. 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 Trivial mitral stenosis.  7. Tricuspid valve regurgitation is moderate.  8. The aortic valve is tricuspid. There is mild calcification of the aortic valve. Aortic valve regurgitation is mild.  9. The inferior vena cava is dilated in size with <50% respiratory variability, suggesting right atrial pressure of 15 mmHg. Comparison(s): Prior images reviewed side by side. Conclusion(s)/Recommendation(s): No significant changes from prior--discussed with Dr. Gardiner Rhyme. Severe MR. Valves better seen on prior imaging. FINDINGS  Left Ventricle: Left ventricular ejection fraction, by estimation, is 65 to 70%. The left ventricle has normal function. The left ventricle has no regional wall motion abnormalities. The left ventricular internal cavity size was  normal in size. There is  moderate concentric left ventricular hypertrophy. Left ventricular diastolic parameters are consistent with Grade III diastolic dysfunction (restrictive). Right Ventricle: The right ventricular size is normal. Right vetricular wall thickness was not well visualized. Right ventricular systolic function is mildly reduced. There is mildly elevated pulmonary artery systolic pressure. The tricuspid regurgitant velocity is 2.69 m/s, and with an assumed right atrial pressure of 15 mmHg, the estimated right ventricular systolic pressure is [id number] mmHg. Left Atrium: Left atrial size was severely dilated. Right Atrium: Right atrial size was mildly dilated. Pericardium: A small pericardial effusion is present. Mitral Valve: The mitral valve is rheumatic. There is severe thickening of the mitral valve leaflet(s). Moderately decreased mobility of the mitral valve leaflets. Severe mitral valve regurgitation, with eccentric posteriorly directed jet. Trivial mitral  valve stenosis. MV peak gradient, 4.5 mmHg. The mean mitral valve gradient is 1.0 mmHg. Tricuspid Valve: The tricuspid valve is not well visualized. Tricuspid valve regurgitation is moderate. Aortic Valve: The aortic valve is tricuspid. There is mild calcification of the aortic valve.  Aortic valve regurgitation is mild. Aortic valve mean gradient measures 4.0 mmHg. Aortic valve peak gradient measures 7.8 mmHg. Aortic valve area, by VTI measures 2.10 cm. Pulmonic Valve: The pulmonic valve was not well visualized. Pulmonic valve regurgitation is trivial. No evidence of pulmonic stenosis. Aorta: The aortic arch was not well visualized and the aortic root and ascending aorta are structurally normal, with no evidence of dilitation. Venous: The inferior vena cava is dilated in size with less than 50% respiratory variability, suggesting right atrial pressure of 15 mmHg. IAS/Shunts: The atrial septum is grossly normal.  LEFT VENTRICLE PLAX 2D LVIDd:          3.60 cm     Diastology LVIDs:         2.20 cm     LV e' medial:    5.25 cm/s LV PW:         1.60 cm     LV E/e' medial:  20.0 LV IVS:        1.70 cm     LV e' lateral:   9.31 cm/s LVOT diam:     1.90 cm     LV E/e' lateral: 11.3 LV SV:         48 LV SV Index:   27 LVOT Area:     2.84 cm  LV Volumes (MOD) LV vol d, MOD A2C: 95.1 ml LV vol d, MOD A4C: 90.8 ml LV vol s, MOD A2C: 41.9 ml LV vol s, MOD A4C: 44.1 ml LV SV MOD A2C:     53.2 ml LV SV MOD A4C:     90.8 ml LV SV MOD BP:      49.0 ml RIGHT VENTRICLE RV Basal diam:  3.30 cm RV S prime:     6.87 cm/s TAPSE (M-mode): 1.2 cm LEFT ATRIUM              Index        RIGHT ATRIUM           Index LA diam:        2.80 cm  1.56 cm/m   RA Area:     19.00 cm LA Vol (A2C):   171.0 ml 95.50 ml/m  RA Volume:   49.00 ml  27.37 ml/m LA Vol (A4C):   119.0 ml 66.46 ml/m LA Biplane Vol: 153.0 ml 85.45 ml/m  AORTIC VALVE AV Area (Vmax):    2.15 cm AV Area (Vmean):   2.05 cm AV Area (VTI):     2.10 cm AV Vmax:           140.00 cm/s AV Vmean:          89.000 cm/s AV VTI:            0.228 m AV Peak Grad:      7.8 mmHg AV Mean Grad:      4.0 mmHg LVOT Vmax:         106.00 cm/s LVOT Vmean:        64.500 cm/s LVOT VTI:          0.169 m LVOT/AV VTI ratio: 0.74  AORTA Ao Root diam: 2.90 cm Ao Asc diam:  3.40 cm MITRAL VALVE                  TRICUSPID VALVE MV Area (PHT): 1.55 cm       TR Peak grad:   28.9 mmHg MV Area VTI:   1.36 cm  TR Vmax:        269.00 cm/s MV Peak grad:  4.5 mmHg MV Mean grad:  1.0 mmHg       SHUNTS MV Vmax:       1.06 m/s       Systemic VTI:  0.17 m MV Vmean:      50.6 cm/s      Systemic Diam: 1.90 cm MV Decel Time: 488 msec MR Peak grad:    100.8 mmHg MR Mean grad:    65.0 mmHg MR Vmax:         502.00 cm/s MR Vmean:        378.0 cm/s MR PISA:         6.28 cm MR PISA Eff ROA: 48 mm MR PISA Radius:  1.00 cm MV E velocity: 105.00 cm/s MV A velocity: 28.30 cm/s MV E/A ratio:  3.71 Jodelle Red MD Electronically signed by Jodelle Red MD Signature Date/Time: 02/08/2022/5:55:39 PM    Final    DG Chest Port 1 View  Result Date: 02/08/2022 CLINICAL DATA:  Nasogastric tube placement and central line placement EXAM: PORTABLE CHEST 1 VIEW COMPARISON:  Earlier today at 2:41 p.m. FINDINGS: 3:57 p.m. Endotracheal tube terminates 2.2 cm above carina. Nasogastric tube extends beyond the inferior aspect of the film. Right internal jugular line tip is favored to terminate at the high right atrium or cavoatrial junction. Numerous leads and wires project over the chest. Patient is rotated left. Mild cardiomegaly. Probable small bilateral pleural effusions. No pneumothorax. Interstitial and airspace disease is symmetric, lower lung predominant, and progressive. IMPRESSION: Mild position degradation. Right internal jugular line tip is favored to terminate at the high right atrium or cavoatrial junction. No pneumothorax. Nasogastric tube extends beyond the inferior aspect of the film. Progressive interstitial and airspace disease, likely pulmonary edema. Electronically Signed   By: Jeronimo Greaves M.D.   On: 02/08/2022 16:17   DG Abd Portable 1V  Result Date: 02/08/2022 CLINICAL DATA:  NG tube placement EXAM: PORTABLE ABDOMEN - 1 VIEW COMPARISON:  06/18/2006 FINDINGS: Tip of enteric tube is seen in the antrum of the stomach. Bowel gas pattern is nonspecific. Pelvis is not included. Haziness in the lower lung fields suggest bilateral pleural effusions. Increased markings in the lower lung fields are noted, more so on the right side suggesting asymmetric pulmonary edema or multifocal pneumonia. IMPRESSION: Tip of enteric tube is seen in the antrum of the stomach. Electronically Signed   By: Ernie Avena M.D.   On: 02/08/2022 16:15   DG CHEST PORT 1 VIEW  Result Date: 02/08/2022 CLINICAL DATA:  Shortness of breath EXAM: PORTABLE CHEST 1 VIEW COMPARISON:  02/07/2022 FINDINGS: Endotracheal tube is 3 cm above the carina. Right central line tip  at the cavoatrial junction. No pneumothorax. Cardiomegaly. Diffuse bilateral interstitial and airspace opacities. This could reflect edema or infection. No visible effusions or pneumothorax. No acute bony abnormality. IMPRESSION: Cardiomegaly. Diffuse bilateral interstitial and alveolar opacities could reflect edema or infection. Electronically Signed   By: Charlett Nose M.D.   On: 02/08/2022 15:12   DG Chest Port 1 View  Result Date: 02/07/2022 CLINICAL DATA:  Tachycardia. EXAM: PORTABLE CHEST 1 VIEW COMPARISON:  Chest x-ray 07/02/2021. FINDINGS: Heart is enlarged, unchanged. There are small bilateral pleural effusions. There is some strandy opacities in the lung bases. No pneumothorax or acute fracture. IMPRESSION: 1. Cardiomegaly with small pleural effusions and minimal bibasilar atelectasis/edema. Electronically Signed   By: Mcneil Sober.D.  On: 02/07/2022 19:45     Assessment & Plan:   Acute on chronic diastolic (congestive) heart failure ([place]) - Ambulatory referral to Cardiology  2. Anoxic brain injury ([place])   3. Acute respiratory failure with hypoxia (H[facility])  Heart healthy diet   Follow up:  Follow up as scheduled     Fenton Foy, NP 03/03/2022

## 2022-03-04 LAB — CBC
Hematocrit: 36.7 % (ref 34.0–46.6)
Hemoglobin: 11.9 g/dL (ref 11.1–15.9)
MCH: 28.3 pg (ref 26.6–33.0)
MCHC: 32.4 g/dL (ref 31.5–35.7)
MCV: 87 fL (ref 79–97)
Platelets: 628 10*3/uL — ABNORMAL HIGH (ref 150–450)
RBC: 4.21 x10E6/uL (ref 3.77–5.28)
RDW: 15.5 % — ABNORMAL HIGH (ref 11.7–15.4)
WBC: 5.9 10*3/uL (ref 3.4–10.8)

## 2022-03-06 ENCOUNTER — Encounter (HOSPITAL_COMMUNITY): Payer: Self-pay

## 2022-03-06 ENCOUNTER — Other Ambulatory Visit: Payer: Self-pay

## 2022-03-06 ENCOUNTER — Ambulatory Visit (HOSPITAL_COMMUNITY)
Admission: RE | Admit: 2022-03-06 | Discharge: 2022-03-06 | Disposition: A | Discharge status: Home / Self Care | Payer: Medicaid Other | Source: Ambulatory Visit | Attending: Family Medicine | Admitting: Family Medicine

## 2022-03-06 ENCOUNTER — Other Ambulatory Visit (HOSPITAL_COMMUNITY): Payer: Self-pay

## 2022-03-06 VITALS — BP 120/80 | HR 90 | Wt 139.2 lb

## 2022-03-06 DIAGNOSIS — I4892 Unspecified atrial flutter: Secondary | ICD-10-CM | POA: Insufficient documentation

## 2022-03-06 DIAGNOSIS — E876 Hypokalemia: Secondary | ICD-10-CM | POA: Diagnosis not present

## 2022-03-06 DIAGNOSIS — Z79899 Other long term (current) drug therapy: Secondary | ICD-10-CM | POA: Insufficient documentation

## 2022-03-06 DIAGNOSIS — I2510 Atherosclerotic heart disease of native coronary artery without angina pectoris: Secondary | ICD-10-CM | POA: Diagnosis not present

## 2022-03-06 DIAGNOSIS — Z8674 Personal history of sudden cardiac arrest: Secondary | ICD-10-CM | POA: Diagnosis not present

## 2022-03-06 DIAGNOSIS — I480 Paroxysmal atrial fibrillation: Secondary | ICD-10-CM | POA: Insufficient documentation

## 2022-03-06 DIAGNOSIS — I5032 Chronic diastolic (congestive) heart failure: Secondary | ICD-10-CM | POA: Insufficient documentation

## 2022-03-06 DIAGNOSIS — I471 Supraventricular tachycardia: Secondary | ICD-10-CM | POA: Insufficient documentation

## 2022-03-06 DIAGNOSIS — I110 Hypertensive heart disease with heart failure: Secondary | ICD-10-CM | POA: Insufficient documentation

## 2022-03-06 DIAGNOSIS — Z7984 Long term (current) use of oral hypoglycemic drugs: Secondary | ICD-10-CM | POA: Diagnosis not present

## 2022-03-06 DIAGNOSIS — I340 Nonrheumatic mitral (valve) insufficiency: Secondary | ICD-10-CM

## 2022-03-06 DIAGNOSIS — I051 Rheumatic mitral insufficiency: Secondary | ICD-10-CM | POA: Diagnosis not present

## 2022-03-06 DIAGNOSIS — Z7901 Long term (current) use of anticoagulants: Secondary | ICD-10-CM | POA: Insufficient documentation

## 2022-03-06 DIAGNOSIS — Z596 Low income: Secondary | ICD-10-CM | POA: Insufficient documentation

## 2022-03-06 LAB — BASIC METABOLIC PANEL
Anion gap: 6 (ref 5–15)
BUN: 23 mg/dL — ABNORMAL HIGH (ref 6–20)
CO2: 24 mmol/L (ref 22–32)
Calcium: 9.7 mg/dL (ref 8.9–10.3)
Chloride: 108 mmol/L (ref 98–111)
Creatinine, Ser: 1.12 mg/dL — ABNORMAL HIGH (ref 0.44–1.00)
GFR, Estimated: 57 mL/min — ABNORMAL LOW (ref >60)
Glucose, Bld: 142 mg/dL — ABNORMAL HIGH (ref 70–99)
Potassium: 4.8 mmol/L (ref 3.5–5.1)
Sodium: 138 mmol/L (ref 135–145)

## 2022-03-06 LAB — CBC
HCT: 38 % (ref 36.0–46.0)
Hemoglobin: 11.6 g/dL — ABNORMAL LOW (ref 12.0–15.0)
MCH: 27.7 pg (ref 26.0–34.0)
MCHC: 30.5 g/dL (ref 30.0–36.0)
MCV: 90.7 fL (ref 80.0–100.0)
Platelets: 432 10*3/uL — ABNORMAL HIGH (ref 150–400)
RBC: 4.19 MIL/uL (ref 3.87–5.11)
RDW: 16.3 % — ABNORMAL HIGH (ref 11.5–15.5)
WBC: 7.4 10*3/uL (ref 4.0–10.5)
nRBC: 0 % (ref 0.0–0.2)

## 2022-03-06 MED ORDER — METOPROLOL SUCCINATE ER 25 MG PO TB24
12.5000 mg | ORAL_TABLET | Freq: Every day | ORAL | 6 refills | Status: DC
  Filled 2022-03-06: qty 15, 30d supply, fill #0

## 2022-03-06 MED ORDER — METOPROLOL SUCCINATE ER 25 MG PO TB24: 12.5000 mg | ORAL_TABLET | Freq: Every day | ORAL | 6 refills | Status: DC

## 2022-03-06 NOTE — Patient Instructions (Addendum)
START Toprol XL 12.5 mg, (one half) tab daily  Labs today We will only contact you if something comes back abnormal or we need to make some changes. Otherwise no news is good news!  You have been referred to [facility] Dr Zebedee Iba -they will be in contact with an appointment  Your physician recommends that you schedule a follow-up appointment in: 4 weeks with Dr Gala Romney  Do the following things EVERYDAY: Weigh yourself in the morning before breakfast. Write it down and keep it in a log. Take your medicines as prescribed Eat low salt foods--Limit salt (sodium) to 2000 mg per day.  Stay as active as you can everyday Limit all fluids for the day to less than 2 liters  At the [facility], you and your health needs are our priority. As part of our continuing mission to provide you with exceptional heart care, we have created designated Provider Care Teams. These Care Teams include your primary Cardiologist (physician) and Advanced Practice Providers (APPs- Physician Assistants and Nurse Practitioners) who all work together to provide you with the care you need, when you need it.   You may see any of the following providers on your designated Care Team at your next follow up: Dr Arvilla Meres Dr Carron Curie, NP Robbie Lis, [place] [facility] [place], [place] Karle Plumber, PharmD   Please be sure to bring in all your medications bottles to every appointment.

## 2022-03-06 NOTE — Progress Notes (Signed)
[facility] Consult Note  PCP: No primary care provider on file. HF Cardiologist: Dr. Haroldine Laws  HPI: Ms Ohagan is a 56 y.o. with a history of SVT and severe MR. Echo 08/2021 LVEF 70-75% with severe MR, mild MS, at least Mod TR and planned for TEE   Followed by EP for SVT. She was seen 07/2021. TEE had been planned to evaluate MR. She no showed for TEE.    Admitted 6/23 from EP office with SVT vs AFL, HR 145. UDS + cocaine. Given adenosine 6, 12, and 12 in ED. TEE was scheduled but aborted due to flash pulmonary edema.  AHF team consulted. PEA arrested in Endo. CPR , epi, and bicarb given with ROSC. Urgent intubation. Felt to have component of septic shock 2/2 PNA, as well as CS. Given IV lasix, started on milrinone and NE. Transferred to ICU. Underwent L/RHC showing  nonobstructive CAD. RA 9, PA 50/10, mean PCWP 12, CI 6.8.  Went into rapid SVT, and underwent emergent DCCV. Extubated, and drips weaned. Amio added for rate control. TCTS planning for MVR with MAZE once recovered from acute illness. Hospitalization c/b AKI, anxiety and hypertension. Required CIR for rehab. Discharged home after CIR, weight 141 lbs.  Today she returns for post hospital HF follow up with her daughter. Overall feeling great. She has no dyspnea with walking, lifting groceries or walking up stairs.  Denies palpitations, abnormal bleeding, CP, dizziness, edema, or PND/Orthopnea. Appetite ok. No fever or chills. Weight at home 140 pounds. Taking all medications. No ETOH or tobacco use. No further drug use since discharge.  Cardiac Studies: - R/LHC (5/23): mild non-obs CAD, EF 60-65%, severe MR by echo, no significant v-waves in PCWP tracing, high cardiac output - may be overestimated by Fick    Ost RCA to Prox RCA lesion is 20% stenosed.   Prox RCA to Mid RCA lesion is 40% stenosed.   Mid LAD lesion is 40% stenosed.   The left ventricular ejection fraction is 55-65% by visual estimate.   Ao =  139/74 LV = 136/12 RA = 9 RV = 46/10 PA = 50/10 (26) PCW = 12 (no significant v waves) Fick cardiac output/index = 12.2/6.8 PVR = 1.8 WU Ao sat = 99% PA sat = 79%, 80% SVC sat = 85%  Plan/Discussion:  CTS consulted for MVR.   - TEE (5/23, bedside): EF 60-65%, RV mildly reduced, small pericardial effusion, severe rheumatic MR, mild AI  - Echo (5/23): EF 65-70%, moderate LVH, grade III DD, moderate TR, severe MR  - Echo (12/22): EF 70-75%, severe MR  Review of Systems: [y] = yes, [ ]  = no   General: Weight gain [ ] ; Weight loss [ ] ; Anorexia [ ] ; Fatigue [ ] ; Fever [ ] ; Chills [ ] ; Weakness [ ]   Cardiac: Chest pain/pressure [ ] ; Resting SOB [ ] ; Exertional SOB [ ] ; Orthopnea [ ] ; Pedal Edema [ ] ; Palpitations [ ] ; Syncope [ ] ; Presyncope [ ] ; Paroxysmal nocturnal dyspnea[ ]   Pulmonary: Cough [ ] ; Wheezing[ ] ; Hemoptysis[ ] ; Sputum [ ] ; Snoring [ ]   GI: Vomiting[ ] ; Dysphagia[ ] ; Melena[ ] ; Hematochezia [ ] ; Heartburn[ ] ; Abdominal pain [ ] ; Constipation [ ] ; Diarrhea [ ] ; BRBPR [ ]   GU: Hematuria[ ] ; Dysuria [ ] ; Nocturia[ ]   Vascular: Pain in legs with walking [ ] ; Pain in feet with lying flat [ ] ; Non-healing sores [ ] ; Stroke [ ] ; TIA [ ] ; Slurred speech [ ] ;  Neuro:  Headaches[ ] ; Vertigo[ ] ; Seizures[ ] ; Paresthesias[ ] ;Blurred vision [ ] ; Diplopia [ ] ; Vision changes [ ]   Ortho/Skin: Arthritis [ ] ; Joint pain [ ] ; Muscle pain [ ] ; Joint swelling [ ] ; Back Pain [ y]; Rash [ ]   Psych: Depression[ ] ; Anxiety[ ]   Heme: Bleeding problems [ ] ; Clotting disorders [ ] ; Anemia [ ]   Endocrine: Diabetes [ ] ; Thyroid dysfunction[ ]    Past Medical History:  Diagnosis Date   Aortic insufficiency    Atrial flutter (HCC) 02/07/2022   PSVT (paroxysmal supraventricular tachycardia) (HCC)    Severe mitral regurgitation    a. 08/2021 Echo: EF 70-75%, no rwma, mod dil LV, GrII DD, nl RV fxn, massively dil LA, mild-mod dil RA, severe MR due to lack of central coaptation of MV leaflets. Mild MS.  Mod TR. Mild-mod AI.   Tricuspid regurgitation    Current Outpatient Medications  Medication Sig Dispense Refill   amiodarone (PACERONE) 200 MG tablet Take 1 tablet (200 mg total) by mouth 2 (two) times daily. 60 tablet 1   apixaban (ELIQUIS) 5 MG TABS tablet Take 1 tablet (5 mg total) by mouth 2 (two) times daily. 60 tablet 1   dapagliflozin propanediol (FARXIGA) 10 MG TABS tablet Take 1 tablet (10 mg total) by mouth daily. 30 tablet 0   docusate (COLACE) 50 MG/5ML liquid Take 10 mLs (100 mg total) by mouth 2 (two) times daily. 600 mL 0   magnesium oxide (MAG-OX) 400 MG tablet Take 1 tablet (400 mg total) by mouth 2 (two) times daily. 60 tablet 0   Multiple Vitamins-Minerals (CERTAVITE/ANTIOXIDANTS PO) Take 400 mg by mouth every morning.     pantoprazole (PROTONIX) 40 MG tablet Take 1 tablet (40 mg total) by mouth at bedtime. 30 tablet 0   polyethylene glycol powder (GLYCOLAX/MIRALAX) 17 GM/SCOOP powder Take 17 g by mouth daily dissolved in water. 238 g 0   QUEtiapine (SEROQUEL) 25 MG tablet Take 1 tablet (25 mg total) by mouth at bedtime as needed (sleep). 30 tablet 0   sacubitril-valsartan (ENTRESTO) 24-26 MG Take 1 tablet by mouth 2 (two) times daily. 180 tablet 3   spironolactone (ALDACTONE) 25 MG tablet Take 1/2 tablet (12.5 mg total) by mouth daily. 30 tablet 0   traZODone (DESYREL) 100 MG tablet Take 1 tablet (100 mg total) by mouth at bedtime. 30 tablet 0   No current facility-administered medications for this encounter.   No Known Allergies  Social History   Socioeconomic History   Marital status: Divorced    Spouse name: Not on file   Number of children: Not on file   Years of education: Not on file   Highest education level: Not on file  Occupational History   Not on file  Tobacco Use   Smoking status: Never   Smokeless tobacco: Never  Vaping Use   Vaping Use: Never used  Substance and Sexual Activity   Alcohol use: Not Currently   Drug use: Yes    Types:  Marijuana, Cocaine   Sexual activity: Not Currently  Other Topics Concern   Not on file  Social History Narrative   Lives locally.  Relatively active.   Social Determinants of Health   Financial Resource Strain: Not on file  Food Insecurity: Not on file  Transportation Needs: Not on file  Physical Activity: Not on file  Stress: Not on file  Social Connections: Not on file  Intimate Partner Violence: Not on file   Family History  Family history unknown:  Yes   BP 120/80   Pulse 90   Wt 63.1 kg (139 lb 3.2 oz)   SpO2 98%   BMI 21.80 kg/m   Wt Readings from Last 3 Encounters:  03/06/22 63.1 kg (139 lb 3.2 oz)  02/24/22 64.2 kg (141 lb 8.6 oz)  02/21/22 62.9 kg (138 lb 10.7 oz)   PHYSICAL EXAM: General:  NAD. No resp difficulty HEENT: Normal Neck: Supple. No JVD. Carotids 2+ bilat; no bruits. No lymphadenopathy or thryomegaly appreciated. Cor: PMI nondisplaced. Regular rate & rhythm. No rubs, gallops, 3/6 MR Lungs: Clear Abdomen: Soft, nontender, nondistended. No hepatosplenomegaly. No bruits or masses. Good bowel sounds. Extremities: No cyanosis, clubbing, rash, edema Neuro: Alert & oriented x 3, cranial nerves grossly intact. Moves all 4 extremities w/o difficulty. Affect pleasant.  ECG (personally reviewed): NSR 83 bpm, rBBB QRS 154 msec  ASSESSMENT & PLAN: 1. HFpEF - Echo (5/23): EF 65-70%, Grade III DD, R mildly reduced, severe MR and rheumatic - NYHA I-II. Volume looks good today. No need for diuretic. - Continue Farxiga 10 mg daily - Continue spiro 12.5 mg daily - Continue Entresto 24/26 mg bid. - Start Toprol XL 12.5 mg daily. - Labs today.   2. H/o PEA Arrest  - during TEE, 2/2 flash pulmonary edema - ACLS x1 min with ROSC - Recovered    3.  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  - Seen by TCTS.  Plan MVR once recovered from acute illness. Has f/u with TCTS in a couple week. - Significant improvement in function status with CIR. - Refer to Dr. Cheree Ditto at [facility] for MVR via mini-thoracotomy approach.    4 . PAF/SVT - A flutter on admit. Spontaneously converted to SR.  - Multiple episodes of AF during hospitalization. - NSR on ECG today. - Continue Eliquis 5 mg bid. No bleeding issues. - Continue amiodarone 200 mg bid. She does not do well out of rhythm. - Planning MAZE at time of MVR.  - CBC today.   5.  h/o AKI  - Labs today.   6. Tobacco/Polysubstance abuse  - UDS on admit + cocaine/THC - Discussed cessation.   7. H/o hypokalemia/hypomagnesemia - Labs today.   8. Hypertension - BP stable - GDMT as above.  9. SDOH - HF meds thru HF fund, Eliquis + Entresto patient assistance. - HFSW helping with Medicaid.  Follow up in 4 weeks with Dr. Haroldine Laws.  Allena Katz, FNP-BC 03/06/22  Patient seen and examined with the above-signed Advanced Practice Provider and/or Housestaff. I personally reviewed laboratory data, imaging studies and relevant notes. I independently examined the patient and formulated the important aspects of the plan. I have edited the note to reflect any of my changes or salient points. I have personally discussed the plan with the patient and/or family.  56 y/o woman with RHD with severe MR. Recent prolonged hospitalization with flash pulmonary edema, PAF, shock, VDRF and PNA. Now much improved.   Here for pos-hospital f/u with her daughter. Doing well. NYHA II Volume status ok Remains in NSR  General:  Well appearing. No resp difficulty HEENT: normal Neck: supple. no JVD. Carotids 2+ bilat; no bruits. No lymphadenopathy or thryomegaly appreciated. Cor: PMI nondisplaced. Regular rate & rhythm. 3/6 MR Lungs: clear Abdomen: soft, nontender, nondistended. No hepatosplenomegaly. No bruits or masses. Good bowel  sounds. Extremities: no cyanosis, clubbing,  rash, edema Neuro: alert & orientedx3, cranial nerves grossly intact. moves all 4 extremities w/o difficulty. Affect pleasant  She is stable post d/c. Maintaining NSR. We discussed possible referral to Dr. Cheree Ditto at [facility] for [facility] +/- Maze. She is interested. I contacted Dr. Cheree Ditto personally who will arrange. Check labs.   Glori Bickers, MD  5:15 PM

## 2022-03-07 NOTE — Progress Notes (Signed)
Heart and Vascular Care Navigation  03/07/2022  Lisa Ayala 1966/02/10 [id number]  Reason for Referral: CSW consulted to speak with pt about current insurance and financial concerns.   Engaged with patient face to face for initial visit for Heart and Vascular Care Coordination.                                                                                                   Assessment:  Met with pt and pt dtr during clinic visit to discuss above.  Per pt she had to stop working in November due to medical concerns and has been relying on family and friends as well as her savings to manage bills since then.  States her dtr who goes to [employer] helps out with some of the financial needs when able and that she also had a fiance that was helping but they have now broken up and he is no longer able to assist.    Has not applied for disability.  CSW able to confirm that referral was placed to [facility] for assistance with this but that she had not heard anything- pt numbers changed recently so [place] has incorrect contact information- Automotive engineer [place] regarding this change.  Per Dr. Haroldine Laws pt needing surgery and after this surgery she should improve enough to be able to return to work- pt very hopeful for this outcome so she can start paying bills again and because she likes to work- awaiting word from [facility] regarding availability for surgery.  Pt has managed to stay on top of her bills but is very worried about this coming month.  Rent is $1025  and she is not sure how she is going to manage this for July.  Similarly utility bills are up to date at this time but unsure how she will manage moving forward.  Reports no other source of support she can ask for assistance with these bills- reports no one she can stay with if she is unable to maintain housing.    Pt without insurance but Medicaid is pending with [employer]- awaiting determination.   Discussed CAFA as alternative option  for coverage for hospital bills if Medicaid is denied.            Reports she is getting food stamps at this time but this is not sufficient for the whole month.  Has utilized food pantries years ago but did not have positive experience with these.  CSW encouraged pt to retry this as an option and provided referral to [facility] as well as information about the mobile fresh food market to help pt get fresh produce.  Pt also expressed concerns about not being able to eat canned food- CSW informed that by rinsing any canned produce this would help to reduce the sodium levels significantly.       HRT/VAS Care Coordination     Living arrangements for the past 2 months Apartment   Lives with: Self   Patient Current Insurance Coverage Self-Pay   Patient Has Concern With [place]  Referrals: [employer]   Does Patient Have Prescription Coverage? No   Patient Prescription Assistance Programs Heart Failure Fund; Patient Assistance Programs   Home Assistive Devices/Equipment None       Social History:                                                                             SDOH Screenings   Alcohol Screen: Not on file  Depression (PHQ2-9): Not on file  Financial Resource Strain: High Risk (03/07/2022)   Overall Financial Resource Strain (CARDIA)    Difficulty of Paying Living Expenses: Very hard  Food Insecurity: Food Insecurity Present (03/06/2022)   Hunger Vital Sign    Worried About Running Out of Food in the Last Year: Sometimes true    Ran Out of Food in the Last Year: Sometimes true  Housing: Medium Risk (03/06/2022)   Housing    Last Housing Risk Score: 1  Physical Activity: Not on file  Social Connections: Not on file  Stress: Not on file  Tobacco Use: Low Risk  (03/06/2022)   Patient History    Smoking Tobacco Use: Never    Smokeless Tobacco Use: Never    Passive Exposure: Not on file  Transportation Needs: Unmet Transportation Needs (03/06/2022)    PRAPARE - Hydrologist (Medical): Yes    Lack of Transportation (Non-Medical): Yes    SDOH Interventions: Financial Resources:  Sales promotion account executive Interventions: Development worker, community, Other (Comment) (disability, local financial resources, GUM) DSS for financial assistance and Social Security for Disability application assistance  Food Insecurity:  Food Insecurity Interventions: Other (Comment) (food pantry referrals) gets food stamps  Housing Insecurity:  Housing Interventions: Other (Comment) (financial resources provided)  Transportation:   Transportation Interventions: Intervention Not Indicated     Follow-up plan:    Pt to work on disability in case she is unable to return to work.  Pt to access food pantries to help with food insecurity.  Pt to look into financial assistance options to assist with future bills.  CSW to reach back out to pt to see if we can assist with patient care fund referral to assist with rent.  Jorge Ny, LCSW Clinical Social Worker [facility] Desk#: [phone] Cell#: [phone]

## 2022-03-15 ENCOUNTER — Ambulatory Visit: Payer: Self-pay | Admitting: Thoracic Surgery (Cardiothoracic Vascular Surgery)

## 2022-03-17 ENCOUNTER — Telehealth (HOSPITAL_COMMUNITY): Payer: Self-pay

## 2022-03-20 ENCOUNTER — Other Ambulatory Visit (HOSPITAL_COMMUNITY): Payer: Self-pay

## 2022-03-20 ENCOUNTER — Telehealth (HOSPITAL_COMMUNITY): Payer: Self-pay | Admitting: Licensed Clinical Social Worker

## 2022-03-20 MED ORDER — DAPAGLIFLOZIN PROPANEDIOL 10 MG PO TABS
10.0000 mg | ORAL_TABLET | Freq: Every day | ORAL | 3 refills | Status: DC
  Filled 2022-03-20: qty 30, 30d supply, fill #0
  Filled 2022-04-10: qty 30, 30d supply, fill #1
  Filled 2022-05-17: qty 30, 30d supply, fill #2
  Filled 2022-06-13: qty 30, 30d supply, fill #3
  Filled 2022-07-12: qty 30, 30d supply, fill #4
  Filled 2022-08-14: qty 30, 30d supply, fill #5
  Filled 2022-09-12: qty 30, 30d supply, fill #6
  Filled 2022-10-11: qty 30, 30d supply, fill #7

## 2022-03-20 MED ORDER — AMIODARONE HCL 200 MG PO TABS
200.0000 mg | ORAL_TABLET | Freq: Two times a day (BID) | ORAL | 3 refills | Status: DC
  Filled 2022-03-20: qty 60, 30d supply, fill #0
  Filled 2022-04-10: qty 60, 30d supply, fill #1
  Filled 2022-05-26: qty 60, 30d supply, fill #2
  Filled 2022-06-13 – 2022-06-23 (×3): qty 60, 30d supply, fill #3

## 2022-03-20 MED ORDER — METOPROLOL SUCCINATE ER 25 MG PO TB24
12.5000 mg | ORAL_TABLET | Freq: Every day | ORAL | 3 refills | Status: DC
  Filled 2022-03-20: qty 45, 90d supply, fill #0
  Filled 2022-03-21: qty 15, 30d supply, fill #0
  Filled 2022-04-10: qty 15, 30d supply, fill #1

## 2022-03-20 MED ORDER — SPIRONOLACTONE 25 MG PO TABS
12.5000 mg | ORAL_TABLET | Freq: Every day | ORAL | 3 refills | Status: DC
  Filled 2022-03-20: qty 15, 30d supply, fill #0
  Filled 2022-04-10: qty 15, 30d supply, fill #1
  Filled 2022-05-17: qty 15, 30d supply, fill #2

## 2022-03-21 ENCOUNTER — Other Ambulatory Visit (HOSPITAL_COMMUNITY): Payer: Self-pay

## 2022-03-21 ENCOUNTER — Other Ambulatory Visit (HOSPITAL_COMMUNITY): Payer: Self-pay | Admitting: Internal Medicine

## 2022-03-22 ENCOUNTER — Telehealth (HOSPITAL_COMMUNITY): Payer: Self-pay | Admitting: Licensed Clinical Social Worker

## 2022-03-22 ENCOUNTER — Other Ambulatory Visit (HOSPITAL_COMMUNITY): Payer: Self-pay

## 2022-03-22 NOTE — Telephone Encounter (Signed)
Pt called CSW back- CSW informed check request being processed.  CSW explained that pt will need to figure out how to pay for rent next month as she has maxed out how much we can assist with.  Pt has consult with [facility] next week to see about surgery which should hopefully allow her to recover enough to start working again.  Will continue to follow and assist as needed  Burna Sis, LCSW Clinical Social Worker [facility] Desk#: [phone] Cell#: [phone]

## 2022-03-22 NOTE — Telephone Encounter (Signed)
CSW called pt to inform that check request had been started for patient- unable to reach but left message with pt dtr to have pt call me back.  Will continue to follow and assist as needed  Burna Sis, LCSW Clinical Social Worker [facility] Desk#: [phone] Cell#: [phone]

## 2022-03-23 ENCOUNTER — Other Ambulatory Visit (HOSPITAL_COMMUNITY): Payer: Self-pay

## 2022-03-23 ENCOUNTER — Inpatient Hospital Stay: Payer: Self-pay | Admitting: Internal Medicine

## 2022-03-24 ENCOUNTER — Ambulatory Visit: Payer: Self-pay

## 2022-03-24 ENCOUNTER — Other Ambulatory Visit (HOSPITAL_COMMUNITY): Payer: Self-pay

## 2022-03-24 NOTE — Telephone Encounter (Signed)
  Chief Complaint: Pt needs a hospital follow up appt. Symptoms: Needs medications refilled Frequency:  Pertinent Negatives: Patient denies  Disposition: [] ED /[] Urgent Care (no appt availability in office) / [] Appointment(In office/virtual)/ []  [place] [facility]/ [] Home Care/ [] Refused Recommended Disposition /[] [place] [facility]/ [x]  Follow-up with PCP   Additional Notes: Pt was hospitalized for heart failure. Pt was given a follow up appt with CHW. PT was unaware of appt. And so missed the appt. Pt needs another hospital follow up appt. And to establish care. Please return pt's call for scheduling.     Reason for Disposition  [1] Caller requesting NON-URGENT health information AND [2] PCP's office is the best resource  Answer Assessment - Initial Assessment Questions 1. REASON FOR CALL or QUESTION: "What is your reason for calling today?" or "How can I best help you?" or "What question do you have that I can help answer?"  PT needs hospital follow up visit and to establish care.  Protocols used: Information Only Call - No Triage-A-AH

## 2022-03-27 ENCOUNTER — Other Ambulatory Visit (HOSPITAL_COMMUNITY): Payer: Self-pay

## 2022-03-27 MED FILL — Magnesium Oxide Tab 400 MG: ORAL | 30 days supply | Qty: 60 | Fill #0 | Status: CN

## 2022-03-27 MED FILL — Quetiapine Fumarate Tab 25 MG: ORAL | 30 days supply | Qty: 30 | Fill #0 | Status: AC

## 2022-03-27 MED FILL — Pantoprazole Sodium EC Tab 40 MG (Base Equiv): ORAL | 30 days supply | Qty: 30 | Fill #0 | Status: AC

## 2022-03-27 NOTE — Telephone Encounter (Signed)
Called pt and appt was made 

## 2022-03-29 ENCOUNTER — Other Ambulatory Visit (HOSPITAL_COMMUNITY): Payer: Self-pay

## 2022-03-31 ENCOUNTER — Other Ambulatory Visit (HOSPITAL_COMMUNITY): Payer: Self-pay

## 2022-04-04 ENCOUNTER — Other Ambulatory Visit (HOSPITAL_COMMUNITY): Payer: Self-pay

## 2022-04-05 ENCOUNTER — Other Ambulatory Visit (HOSPITAL_COMMUNITY): Payer: Self-pay

## 2022-04-07 ENCOUNTER — Encounter (HOSPITAL_COMMUNITY): Payer: Self-pay | Admitting: Internal Medicine

## 2022-04-07 ENCOUNTER — Ambulatory Visit (HOSPITAL_COMMUNITY)
Admission: RE | Admit: 2022-04-07 | Discharge: 2022-04-07 | Disposition: A | Discharge status: Home / Self Care | Payer: Medicaid Other | Source: Ambulatory Visit | Attending: Internal Medicine | Admitting: Internal Medicine

## 2022-04-07 VITALS — BP 112/70 | HR 71 | Wt 148.0 lb

## 2022-04-07 DIAGNOSIS — I480 Paroxysmal atrial fibrillation: Secondary | ICD-10-CM | POA: Diagnosis not present

## 2022-04-07 DIAGNOSIS — I471 Supraventricular tachycardia: Secondary | ICD-10-CM | POA: Diagnosis not present

## 2022-04-07 DIAGNOSIS — Z79899 Other long term (current) drug therapy: Secondary | ICD-10-CM | POA: Diagnosis not present

## 2022-04-07 DIAGNOSIS — Z5941 Food insecurity: Secondary | ICD-10-CM | POA: Diagnosis not present

## 2022-04-07 DIAGNOSIS — Z87891 Personal history of nicotine dependence: Secondary | ICD-10-CM | POA: Insufficient documentation

## 2022-04-07 DIAGNOSIS — I340 Nonrheumatic mitral (valve) insufficiency: Secondary | ICD-10-CM | POA: Diagnosis not present

## 2022-04-07 DIAGNOSIS — I110 Hypertensive heart disease with heart failure: Secondary | ICD-10-CM | POA: Insufficient documentation

## 2022-04-07 DIAGNOSIS — I5032 Chronic diastolic (congestive) heart failure: Secondary | ICD-10-CM | POA: Diagnosis present

## 2022-04-07 DIAGNOSIS — I2510 Atherosclerotic heart disease of native coronary artery without angina pectoris: Secondary | ICD-10-CM | POA: Insufficient documentation

## 2022-04-07 DIAGNOSIS — Z5986 Financial insecurity: Secondary | ICD-10-CM | POA: Diagnosis not present

## 2022-04-07 DIAGNOSIS — Z7901 Long term (current) use of anticoagulants: Secondary | ICD-10-CM | POA: Insufficient documentation

## 2022-04-07 DIAGNOSIS — F1290 Cannabis use, unspecified, uncomplicated: Secondary | ICD-10-CM | POA: Diagnosis not present

## 2022-04-07 NOTE — Patient Instructions (Signed)
Your physician recommends that you schedule a follow-up appointment in: 3 months, **PLEASE CALL OUR OFFICE IN AUGUST TO SCHEDULE THIS APPOINTMENT  If you have any questions or concerns before your next appointment please send [place] a message through [place] or call our office at [phone].    TO LEAVE A MESSAGE FOR THE NURSE SELECT OPTION 2, PLEASE LEAVE A MESSAGE INCLUDING: YOUR NAME DATE OF BIRTH CALL BACK NUMBER REASON FOR CALL**this is important as we prioritize the call backs  YOU WILL RECEIVE A CALL BACK THE SAME DAY AS LONG AS YOU CALL BEFORE 4:00 PM  At the [facility], you and your health needs are our priority. As part of our continuing mission to provide you with exceptional heart care, we have created designated Provider Care Teams. These Care Teams include your primary Cardiologist (physician) and Advanced Practice Providers (APPs- Physician Assistants and Nurse Practitioners) who all work together to provide you with the care you need, when you need it.   You may see any of the following providers on your designated Care Team at your next follow up: Dr Arvilla Meres Dr Carron Curie, NP Robbie Lis, [place] [facility] [place], [place] Karle Plumber, PharmD   Please be sure to bring in all your medications bottles to every appointment.

## 2022-04-07 NOTE — Progress Notes (Signed)
Advanced [facility] Note  PCP: No primary care provider on file. HF Cardiologist: Dr. Gala Romney  HPI: Ms Akers is a 56 y.o. with a history of SVT and severe MR. Echo 08/2021 LVEF 70-75% with severe MR, mild MS, at least Mod TR and planned for TEE   Followed by EP for SVT. She was seen 07/2021. TEE had been planned to evaluate MR. She no showed for TEE.    Admitted 6/23 from EP office with SVT vs AFL, HR 145. UDS + cocaine. Given adenosine 6, 12, and 12 in ED. TEE was scheduled but aborted due to flash pulmonary edema.  AHF team consulted. PEA arrested in Endo. CPR , epi, and bicarb given with ROSC. Urgent intubation. Felt to have component of septic shock 2/2 PNA, as well as CS. Given IV lasix, started on milrinone and NE. Transferred to ICU. Underwent L/RHC showing  nonobstructive CAD. RA 9, PA 50/10, mean PCWP 12, CI 6.8.  Went into rapid SVT, and underwent emergent DCCV. Extubated, and drips weaned. Amio added for rate control. TCTS planning for MVR with MAZE once recovered from acute illness. Hospitalization c/b AKI, anxiety and hypertension. Required CIR for rehab. Discharged home after CIR, weight 141 lbs.  Her for f/u with her daughter. Feeling fine. Denies CP or SOB. Doing all ADLs without problem. Going to store. No palpitations, CP or SOB. No bleeding with Eliquis. Saw Dr. Zebedee Iba scheduled for triple valve surgery on 8/10.  Cardiac Studies: - R/LHC (5/23): mild non-obs CAD, EF 60-65%, severe MR by echo, no significant v-waves in PCWP tracing, high cardiac output - may be overestimated by Fick    Ost RCA to Prox RCA lesion is 20% stenosed.   Prox RCA to Mid RCA lesion is 40% stenosed.   Mid LAD lesion is 40% stenosed.   The left ventricular ejection fraction is 55-65% by visual estimate.   Ao = 139/74 LV = 136/12 RA = 9 RV = 46/10 PA = 50/10 (26) PCW = 12 (no significant v waves) Fick cardiac output/index = 12.2/6.8 PVR = 1.8 WU Ao sat = 99% PA sat = 79%,  80% SVC sat = 85%  Plan/Discussion:  CTS consulted for MVR.   - TEE (5/23, bedside): EF 60-65%, RV mildly reduced, small pericardial effusion, severe rheumatic MR, mild AI  - Echo (5/23): EF 65-70%, moderate LVH, grade III DD, moderate TR, severe MR  - Echo (12/22): EF 70-75%, severe MR  Past Medical History:  Diagnosis Date   Aortic insufficiency    Atrial flutter (HCC) 02/07/2022   PSVT (paroxysmal supraventricular tachycardia) (HCC)    Severe mitral regurgitation    a. 08/2021 Echo: EF 70-75%, no rwma, mod dil LV, GrII DD, nl RV fxn, massively dil LA, mild-mod dil RA, severe MR due to lack of central coaptation of MV leaflets. Mild MS. Mod TR. Mild-mod AI.   Tricuspid regurgitation    Current Outpatient Medications  Medication Sig Dispense Refill   amiodarone (PACERONE) 200 MG tablet Take 1 tablet (200 mg total) by mouth 2 (two) times daily. 180 tablet 3   apixaban (ELIQUIS) 5 MG TABS tablet Take 1 tablet (5 mg total) by mouth 2 (two) times daily. 60 tablet 1   dapagliflozin propanediol (FARXIGA) 10 MG TABS tablet Take 1 tablet (10 mg total) by mouth daily. 90 tablet 3   magnesium oxide (MAG-OX) 400 MG tablet Take 1 tablet (400 mg total) by mouth 2 (two) times daily. 60 tablet 0  metoprolol succinate (TOPROL XL) 25 MG 24 hr tablet Take 0.5 tablets (12.5 mg total) by mouth daily. 45 tablet 3   Multiple Vitamin (MULTIVITAMIN ADULT PO) Take 1 tablet by mouth daily.     pantoprazole (PROTONIX) 40 MG tablet Take 1 tablet (40 mg total) by mouth at bedtime. 30 tablet 0   polyethylene glycol powder (GLYCOLAX/MIRALAX) 17 GM/SCOOP powder Take 17 g by mouth daily dissolved in water. 238 g 0   QUEtiapine (SEROQUEL) 25 MG tablet Take 1 tablet (25 mg total) by mouth at bedtime as needed (sleep). 30 tablet 0   sacubitril-valsartan (ENTRESTO) 24-26 MG Take 1 tablet by mouth 2 (two) times daily. 180 tablet 3   spironolactone (ALDACTONE) 25 MG tablet Take 1/2 tablet (12.5 mg total) by mouth daily.  45 tablet 3   No current facility-administered medications for this encounter.   No Known Allergies  Social History   Socioeconomic History   Marital status: Divorced    Spouse name: Not on file   Number of children: Not on file   Years of education: Not on file   Highest education level: Not on file  Occupational History   Not on file  Tobacco Use   Smoking status: Never   Smokeless tobacco: Never  Vaping Use   Vaping Use: Never used  Substance and Sexual Activity   Alcohol use: Not Currently   Drug use: Yes    Types: Marijuana, Cocaine   Sexual activity: Not Currently  Other Topics Concern   Not on file  Social History Narrative   Lives locally.  Relatively active.   Social Determinants of Health   Financial Resource Strain: High Risk (03/20/2022)   Overall Financial Resource Strain (CARDIA)    Difficulty of Paying Living Expenses: Very hard  Food Insecurity: Food Insecurity Present (03/06/2022)   Hunger Vital Sign    Worried About Running Out of Food in the Last Year: Sometimes true    Ran Out of Food in the Last Year: Sometimes true  Transportation Needs: Unmet Transportation Needs (03/20/2022)   PRAPARE - Hydrologist (Medical): Yes    Lack of Transportation (Non-Medical): Yes  Physical Activity: Not on file  Stress: Not on file  Social Connections: Not on file  Intimate Partner Violence: Not on file   Family History  Family history unknown: Yes   BP 112/70   Pulse 71   Wt 67.1 kg (148 lb)   SpO2 100%   BMI 23.18 kg/m   Wt Readings from Last 3 Encounters:  04/07/22 67.1 kg (148 lb)  03/06/22 63.1 kg (139 lb 3.2 oz)  02/24/22 64.2 kg (141 lb 8.6 oz)   PHYSICAL EXAM: General:  Well appearing. No resp difficulty HEENT: normal Neck: supple. no JVD. Carotids 2+ bilat; no bruits. No lymphadenopathy or thryomegaly appreciated. Cor: PMI nondisplaced. Regular rate & rhythm. 2/6 SEM RUSB 2/6 MR Lungs: clear Abdomen: soft,  nontender, nondistended. No hepatosplenomegaly. No bruits or masses. Good bowel sounds. Extremities: no cyanosis, clubbing, rash, edema Neuro: alert & orientedx3, cranial nerves grossly intact. moves all 4 extremities w/o difficulty. Affect pleasant   ECG (personally reviewed): NSR 83 bpm, rBBB QRS 154 msec  ASSESSMENT & PLAN:  1. HFpEF - Echo (5/23): EF 65-70%, Grade III DD, R mildly reduced, severe MR and rheumatic - Stable NYHA I-II Volume status looks good - Continue Farxiga 10 mg daily - Continue spiro 12.5 mg daily - Continue Entresto 24/26 mg bid. - Continue Toprol  XL 12.5 mg daily.   2.  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  - Planning surgery on 05/04/22 with Dr. Zebedee Iba  3 . PAF/SVT - A flutter on admit. Spontaneously converted to SR.  - Multiple episodes of AF during hospitalization. - In NSR - Continue Eliquis 5 mg bid. No bleeding issues. - Continue amiodarone 200 mg bid. She does not do well out of rhythm. - Planning MAZE at time of MVR.   4. Tobacco/Polysubstance abuse  - UDS on admit + cocaine/THC - She is off smoking    Arvilla Meres, MD  3:43 PM

## 2022-04-10 ENCOUNTER — Other Ambulatory Visit (HOSPITAL_COMMUNITY): Payer: Self-pay

## 2022-04-11 ENCOUNTER — Ambulatory Visit: Payer: Medicaid Other | Attending: Critical Care Medicine | Admitting: Critical Care Medicine

## 2022-04-11 ENCOUNTER — Encounter: Payer: Self-pay | Admitting: Critical Care Medicine

## 2022-04-11 VITALS — BP 155/102 | HR 71 | Wt 149.4 lb

## 2022-04-11 DIAGNOSIS — E440 Moderate protein-calorie malnutrition: Secondary | ICD-10-CM

## 2022-04-11 DIAGNOSIS — I5032 Chronic diastolic (congestive) heart failure: Secondary | ICD-10-CM

## 2022-04-11 DIAGNOSIS — I340 Nonrheumatic mitral (valve) insufficiency: Secondary | ICD-10-CM

## 2022-04-11 DIAGNOSIS — R7401 Elevation of levels of liver transaminase levels: Secondary | ICD-10-CM

## 2022-04-11 DIAGNOSIS — G931 Anoxic brain damage, not elsewhere classified: Secondary | ICD-10-CM | POA: Diagnosis not present

## 2022-04-11 NOTE — Assessment & Plan Note (Signed)
This has resolved.

## 2022-04-11 NOTE — Patient Instructions (Signed)
No change in medication  No labs indicated at this visit  We are getting you in with dental medicine at [facility] for an assessment to see if you need any of the teeth pulled before your valve replacements  You have primary care screenings will be due after you recover from your surgery return to Dr. Delford Field in September/early October

## 2022-04-11 NOTE — H&P (View-Only) (Signed)
New Patient Office Visit  Subjective    Patient ID: Lisa Ayala, female    DOB: [birth year]  Age: 56 y.o. MRN: [id number]  CC:  Chief Complaint  Patient presents with   Hospitalization Follow-up   Establish Care    HPI Lisa Ayala presents to establish care and for post hospital follow-up This patient was admitted for a prolonged period of time in mid May with documentations as below for the rehab portion of the stay and also recapitulation of the hospital stay prior to the rehab.  This patient has significant mitral tricuspid and aortic valve disease and will need replacements of all the valves along with a Maze procedure and atrial appendage clipping on the left atrium.  This is all to be done at [facility].  Patient is seen today for primary care to establish.  She did see one of our nurse practitioners in June briefly.  She has since seen cardiology as documented below. Seen by NP nichols 02/2022 Hospital Admission: 02/21/22   Hospital Course: Lisa Ayala was admitted to rehab 02/21/2022 for inpatient therapies to consist of PT, ST and OT at least three hours five days a week. Past admission physiatrist, therapy team and rehab RN have worked together to provide customized collaborative inpatient rehab.  Of CBC showed H&H to be relatively stable and leukocytosis had resolved.  She has been afebrile during her stay and respiratory status has been stable.  Mild AKI noted and stable.  Her blood pressures were monitored on TID basis and have been stable.  No cardiac symptoms reported with increase in activity.  No signs of overload noted.  Dapagliflozin was added at discharge per cardiology recommendations.  She continues on trazodone to help manage sleep-wake disruption.  She made great gains during her rehab stay and is modified independent.  No further therapy recommended at this time.  She is to follow-up with cardiology for input on cardiac rehab.  She was discharged to  home in improved condition.      Rehab course: During patient's stay in rehab team conference was held to discuss patient's progress, set goals and barriers to discharge. At admission, patient required supervision with mobility and basic ADL tasks. Cognistat was [facility] with exception of one-point below normal for short-term recall with reports of mild memory change. She she is able to complete ADL tasks at modified independent level.  She is modified independent for transfers and is able to ambulate 1400 feet without rest breaks.  She is able to complete mildly complex for functional tasks at modified independent level.  Daughter will provide supervision and assistance as needed after discharge.     HPI   Lisa Ayala is a 56 y.o. female with history of PSVT, severe MR, moderate TR and mild to moderate AI, anxiety disorder, regular marijuana use, cocaine use.    Patient presents today for a hospital follow-up.  Please see hospital notes above.  Patient states that she has been doing well since hospital discharge.  Her daughter is here with her today and is helping care for her.  Patient is ambulating independently.  She states that she was able to get all of her medications at hospital discharge and has been taking them as directed.  Patient does have her follow-up appointments already scheduled.  She will be seeing cardiology next week and she also has an appointment to establish care with [facility] end of  the month.  Patient also has an appointment scheduled with cardiothoracic surgery for further evaluation and possible work-up for mitral valve replacement. Denies f/c/s, n/v/d, hemoptysis, PND, leg swelling Denies chest pain or edema   Hosp DC 01/2022 Admit date: 02/21/2022 Discharge date: 02/24/2022   Discharge Diagnoses:  Principal Problem:   Anoxic brain injury [facility]) Active Problems:   Severe mitral regurgitation   Chronic diastolic heart failure  [facility])     Discharged Condition: stable     Brief HPI:   Lisa Ayala is a 56 y.o. female with history of PSVT, severe MR, moderate TR and mild to moderate AI, anxiety disorder, regular marijuana use who was admitted on 02/07/2022 via cardiology office with reports of tachycardia and rapid breathing with SOB.  She was found to have SVT with a flutter, was treated with Herbert Pun sent and started on IV heparin with plans for TEE.  She did develop hypoxia with acute on chronic diastolic heart failure, demand ischemia and found to have transaminitis as well as UDS positive for THC and cocaine.  She was treated with IV diuresis and started on amiodarone due to ongoing issues with a flutter.   She did have worsening of respiratory status prior to TEE requiring intubation and pressors but deteriorated to PEA arrest requiring CPR and epi x1 minute with ROSC.  Hospital course significant for issues with agitation, SVT with heart rates up to 200 requiring emergent DCCV on 05/19 and 05/20 as well as fevers due to PNA requiring Zosyn.  Dr. Roxan Hockey was consulted for input and recommended MVR once medically optimized.  She tolerated extubation on 05/23 and was placed on regular diet.  PT/OT evaluation completed and patient noted to have weakness with balance deficits.  CIR was recommended due to functional decline.     Hospital Course: Lisa Ayala was admitted to rehab 02/21/2022 for inpatient therapies to consist of PT, ST and OT at least three hours five days a week. Past admission physiatrist, therapy team and rehab RN have worked together to provide customized collaborative inpatient rehab.  Of CBC showed H&H to be relatively stable and leukocytosis had resolved.  She has been afebrile during her stay and respiratory status has been stable.  Mild AKI noted and stable.  Her blood pressures were monitored on TID basis and have been stable.  No cardiac symptoms reported with increase in activity.  No signs of  overload noted.  Dapagliflozin was added at discharge per cardiology recommendations.  She continues on trazodone to help manage sleep-wake disruption.  She made great gains during her rehab stay and is modified independent.  No further therapy recommended at this time.  She is to follow-up with cardiology for input on cardiac rehab.  She was discharged to home in improved condition.      Rehab course: During patient's stay in rehab team conference was held to discuss patient's progress, set goals and barriers to discharge. At admission, patient required supervision with mobility and basic ADL tasks. Cognistat was [facility] with exception of one-point below normal for short-term recall with reports of mild memory change. She she is able to complete ADL tasks at modified independent level.  She is modified independent for transfers and is able to ambulate 1400 feet without rest breaks.  She is able to complete mildly complex for functional tasks at modified independent level.  Daughter will provide supervision and assistance as needed after discharge.   Disposition: Home  Below is heart failure clinic documentations  just this week Saw [facility] 7/14: Ms Cotrell is a 56 y.o. with a history of SVT and severe MR. Echo 08/2021 LVEF 70-75% with severe MR, mild MS, at least Mod TR and planned for TEE   Followed by EP for SVT. She was seen 07/2021. TEE had been planned to evaluate MR. She no showed for TEE.     Admitted 6/23 from EP office with SVT vs AFL, HR 145. UDS + cocaine. Given adenosine 6, 12, and 12 in ED. TEE was scheduled but aborted due to flash pulmonary edema.  AHF team consulted. PEA arrested in Endo. CPR , epi, and bicarb given with ROSC. Urgent intubation. Felt to have component of septic shock 2/2 PNA, as well as CS. Given IV lasix, started on milrinone and NE. Transferred to ICU. Underwent L/RHC showing  nonobstructive CAD. RA 9, PA 50/10, mean PCWP 12, CI 6.8.  Went into rapid SVT, and underwent  emergent DCCV. Extubated, and drips weaned. Amio added for rate control. TCTS planning for MVR with MAZE once recovered from acute illness. Hospitalization c/b AKI, anxiety and hypertension. Required CIR for rehab. Discharged home after CIR, weight 141 lbs.   Her for f/u with her daughter. Feeling fine. Denies CP or SOB. Doing all ADLs without problem. Going to store. No palpitations, CP or SOB. No bleeding with Eliquis. Saw Dr. Cheree Ditto scheduled for triple valve surgery on 8/10.       1. HFpEF - Echo (5/23): EF 65-70%, Grade III DD, R mildly reduced, severe MR and rheumatic - Stable NYHA I-II Volume status looks good - Continue Farxiga 10 mg daily - Continue spiro 12.5 mg daily - Continue Entresto 24/26 mg bid. - Continue Toprol XL 12.5 mg daily.   2.  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  - Planning surgery on 05/04/22 with Dr. Cheree Ditto   3 . PAF/SVT - A flutter on admit. Spontaneously converted to SR.  - Multiple episodes of AF during hospitalization. - In NSR - Continue Eliquis 5 mg bid. No bleeding issues. - Continue amiodarone 200 mg bid. She does not do well out of rhythm. - Planning MAZE at time of MVR.    4. Tobacco/Polysubstance abuse  - UDS on admit + cocaine/THC - She is off smoking    Below is documentation from the [place] clinic visit previously his surgery scheduled for August 10 Has MVR/AVR scheduled 8/10 at [facility]  1. Continue your medical care with your established primary care provider and/or cardiologist.  2. Contact Dr. Aundra Millet office at Phone: [phone] and Fax: [phone] directly with any additional questions or concerns.  3. Dr. Cheree Ditto has recommended proceeding with CT surgical procedure to include aortic valve replacement/mitral valve replacement/possible tricuspid valve repair or replacement/MAZE and  placement left atrial appendage clip.  4. Future date of surgery scheduled Dr. Cheree Ditto: 05/04/2022.  5. Take last dose of Eliquis on 04/28/2022  6. Dr. Aundra Millet office will arrange for you to be admitted to [facility] 05/03/2022 for initiation of IV heparin blood thinner preoperatively.  7. You have been provided preoperative chlorhexidine skin wipes and associated instructions for use to begin at the house on the night of 05/02/2022.    Note the patient has significantly poor dentition and this has yet to be assessed.  Patient has no other complaints at this time.  On arrival blood pressure elevated 155/102.  She is very stressed.  She is worried over her surgery.  Note blood pressures were normal at cardiology earlier this week.  Patient is compliant with all her medications.  Patient is accompanied by her daughter who goes with her to all her medical visits.  She is in [place] in Pharmacologist  The patient had memory deficits after her cardiac arrest in the hospital however these are rapidly improving This patient previously used cocaine but recently has been negative     Outpatient Encounter Medications as of 04/11/2022  Medication Sig   amiodarone (PACERONE) 200 MG tablet Take 1 tablet (200 mg total) by mouth 2 (two) times daily.   apixaban (ELIQUIS) 5 MG TABS tablet Take 1 tablet (5 mg total) by mouth 2 (two) times daily.   dapagliflozin propanediol (FARXIGA) 10 MG TABS tablet Take 1 tablet (10 mg total) by mouth daily.   magnesium oxide (MAG-OX) 400 MG tablet Take 1 tablet (400 mg total) by mouth 2 (two) times daily.   metoprolol succinate (TOPROL XL) 25 MG 24 hr tablet Take 0.5 tablets (12.5 mg total) by mouth daily.   Multiple Vitamin (MULTIVITAMIN ADULT PO) Take 1 tablet by mouth daily.   pantoprazole (PROTONIX) 40 MG tablet Take 1 tablet (40 mg total) by mouth at bedtime.   polyethylene glycol powder (GLYCOLAX/MIRALAX) 17 GM/SCOOP  powder Take 17 g by mouth daily dissolved in water.   QUEtiapine (SEROQUEL) 25 MG tablet Take 1 tablet (25 mg total) by mouth at bedtime as needed (sleep).   sacubitril-valsartan (ENTRESTO) 24-26 MG Take 1 tablet by mouth 2 (two) times daily.   spironolactone (ALDACTONE) 25 MG tablet Take 1/2 tablet (12.5 mg total) by mouth daily.   No facility-administered encounter medications on file as of 04/11/2022.    Past Medical History:  Diagnosis Date   Aortic insufficiency    Atrial flutter ([place]) 02/07/2022   PSVT (paroxysmal supraventricular tachycardia) (HCC)    Severe mitral regurgitation    a. 08/2021 Echo: EF 70-75%, no rwma, mod dil LV, GrII DD, nl RV fxn, massively dil LA, mild-mod dil RA, severe MR due to lack of central coaptation of MV leaflets. Mild MS. Mod TR. Mild-mod AI.   Tricuspid regurgitation     Past Surgical History:  Procedure Laterality Date   RIGHT/LEFT HEART CATH AND CORONARY ANGIOGRAPHY N/A 02/10/2022   Procedure: RIGHT/LEFT HEART CATH AND CORONARY ANGIOGRAPHY;  Surgeon: Jolaine Artist, MD;  Location: [place] [facility];  Service: Cardiovascular;  Laterality: N/A;   TEE WITHOUT CARDIOVERSION N/A 02/08/2022   Procedure: TRANSESOPHAGEAL ECHOCARDIOGRAM (TEE);  Surgeon: Geralynn Rile, MD;  Location: [place] [facility];  Service: Cardiovascular;  Laterality: N/A;    Family History  Family history unknown: Yes    Social History   Socioeconomic History   Marital status: Divorced    Spouse name: Not on file   Number of children: Not on file   Years of education: Not on file   Highest education level: Not on file  Occupational History   Not on file  Tobacco Use   Smoking status: Never   Smokeless tobacco: Never  Vaping Use   Vaping Use: Never used  Substance and Sexual Activity   Alcohol use: Not Currently   Drug use: Not Currently    Types: Marijuana, Cocaine   Sexual activity: Not Currently  Other Topics Concern   Not on file  Social History  Narrative  Lives locally.  Relatively active.   Social Determinants of Health   Financial Resource Strain: High Risk (03/20/2022)   Overall Financial Resource Strain (CARDIA)    Difficulty of Paying Living Expenses: Very hard  Food Insecurity: Food Insecurity Present (03/06/2022)   Hunger Vital Sign    Worried About Running Out of Food in the Last Year: Sometimes true    Ran Out of Food in the Last Year: Sometimes true  Transportation Needs: Unmet Transportation Needs (03/20/2022)   PRAPARE - Hydrologist (Medical): Yes    Lack of Transportation (Non-Medical): Yes  Physical Activity: Not on file  Stress: Not on file  Social Connections: Not on file  Intimate Partner Violence: Not on file    Review of Systems  Constitutional:  Negative for chills, diaphoresis, fever, malaise/fatigue and weight loss.  HENT:  Negative for congestion, hearing loss, nosebleeds, sore throat and tinnitus.   Eyes:  Negative for blurred vision, photophobia and redness.  Respiratory:  Negative for cough, hemoptysis, sputum production, shortness of breath, wheezing and stridor.   Cardiovascular:  Negative for chest pain, palpitations, orthopnea, claudication, leg swelling and PND.  Gastrointestinal:  Negative for abdominal pain, blood in stool, constipation, diarrhea, heartburn, nausea and vomiting.  Genitourinary:  Negative for dysuria, flank pain, frequency, hematuria and urgency.  Musculoskeletal:  Negative for back pain, falls, joint pain, myalgias and neck pain.  Skin:  Negative for itching and rash.  Neurological:  Negative for dizziness, tingling, tremors, sensory change, speech change, focal weakness, seizures, loss of consciousness, weakness and headaches.  Endo/Heme/Allergies:  Negative for environmental allergies and polydipsia. Does not bruise/bleed easily.  Psychiatric/Behavioral:  Negative for depression, memory loss, substance abuse and suicidal ideas. The patient is  not nervous/anxious and does not have insomnia.         Objective    BP (!) 155/102   Pulse 71   Wt 149 lb 6.4 oz (67.8 kg)   SpO2 100%   BMI 23.40 kg/m   Physical Exam Vitals reviewed.  Constitutional:      Appearance: Normal appearance. She is well-developed. She is not diaphoretic.  HENT:     Head: Normocephalic and atraumatic.     Nose: Nose normal. No nasal deformity, septal deviation, mucosal edema or rhinorrhea.     Right Sinus: No maxillary sinus tenderness or frontal sinus tenderness.     Left Sinus: No maxillary sinus tenderness or frontal sinus tenderness.     Mouth/Throat:     Mouth: Mucous membranes are moist.     Pharynx: No oropharyngeal exudate.     Comments: Extremely poor dentition several carious teeth seen with severe periodontal disease several missing teeth as well Eyes:     General: No scleral icterus.    Conjunctiva/sclera: Conjunctivae normal.     Pupils: Pupils are equal, round, and reactive to light.  Neck:     Thyroid: No thyromegaly.     Vascular: No carotid bruit or JVD.     Trachea: Trachea normal. No tracheal tenderness or tracheal deviation.  Cardiovascular:     Rate and Rhythm: Normal rate and regular rhythm.     Chest Wall: PMI is not displaced.     Pulses: Normal pulses. No decreased pulses.     Heart sounds: S1 normal and S2 normal. Heart sounds not distant. Murmur heard.     No systolic murmur is present.     No diastolic murmur is present.     No friction  rub. No gallop. No S3 or S4 sounds.  Pulmonary:     Effort: Pulmonary effort is normal. No tachypnea, accessory muscle usage or respiratory distress.     Breath sounds: Normal breath sounds. No stridor. No decreased breath sounds, wheezing, rhonchi or rales.  Chest:     Chest wall: No tenderness.  Abdominal:     General: Bowel sounds are normal. There is no distension.     Palpations: Abdomen is soft. Abdomen is not rigid.     Tenderness: There is no abdominal tenderness.  There is no guarding or rebound.  Musculoskeletal:        General: Normal range of motion.     Cervical back: Normal range of motion and neck supple. No edema, erythema or rigidity. No muscular tenderness. Normal range of motion.  Lymphadenopathy:     Head:     Right side of head: No submental or submandibular adenopathy.     Left side of head: No submental or submandibular adenopathy.     Cervical: No cervical adenopathy.  Skin:    General: Skin is warm and dry.     Coloration: Skin is not pale.     Findings: No rash.     Nails: There is no clubbing.  Neurological:     Mental Status: She is alert and oriented to person, place, and time.     Sensory: No sensory deficit.  Psychiatric:        Speech: Speech normal.        Behavior: Behavior normal.        Significant Diagnostic Studies: Cardiac Studies: - R/LHC (5/23): mild non-obs CAD, EF 60-65%, severe MR by echo, no significant v-waves in PCWP tracing, high cardiac output - may be overestimated by Fick     Ost RCA to Prox RCA lesion is 20% stenosed.   Prox RCA to Mid RCA lesion is 40% stenosed.   Mid LAD lesion is 40% stenosed.   The left ventricular ejection fraction is 55-65% by visual estimate.   Ao = 139/74 LV = 136/12 RA = 9 RV = 46/10 PA = 50/10 (26) PCW = 12 (no significant v waves) Fick cardiac output/index = 12.2/6.8 PVR = 1.8 WU Ao sat = 99% PA sat = 79%, 80% SVC sat = 85%   Plan/Discussion:  CTS consulted for MVR.    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 EXAM: ABDOMEN - 1 VIEW COMPARISON:  Radiograph 01/12/2022 FINDINGS: Feeding 2 with tip in the proximal duodenum. Increasing gaseous distention of the colon. Transverse colon measures  6 cm which is within normal limits. There is gas in the rectum. IMPRESSION: 1. Feeding tube in first portion duodenum. 2. Gas distended colon without evidence obstruction. Electronically Signed   By: Suzy Bouchard M.D.   On: 02/17/2022 14:25    DG Abd 1 View   Result Date: 02/15/2022 CLINICAL DATA:  Ileus EXAM: ABDOMEN - 1 VIEW COMPARISON:  Portable exam 0820 hours compared to 02/08/2022 FINDINGS: Nonobstructive bowel gas pattern. No bowel dilatation or bowel wall thickening. Improved atelectasis versus consolidation LEFT lower lobe. No urinary tract calcification. Levoconvex thoracolumbar scoliosis. IMPRESSION: No acute abnormalities. Electronically Signed   By: Elta Guadeloupe  Thornton Papas M.D.   On: 02/15/2022 08:27    CT HEAD WO CONTRAST (5MM)   Result Date: 02/15/2022 CLINICAL DATA:  Altered mental status. EXAM: CT HEAD WITHOUT CONTRAST TECHNIQUE: Contiguous axial images were obtained from the base of the skull through the vertex without intravenous contrast. RADIATION DOSE REDUCTION: This exam was performed according to the departmental dose-optimization program which includes automated exposure control, adjustment of the mA and/or kV according to patient size and/or use of iterative reconstruction technique. COMPARISON:  None Available. FINDINGS: Brain: No evidence of acute infarction, hemorrhage, hydrocephalus, extra-axial collection or mass lesion/mass effect. Focal area of low density within the left cerebellar hemisphere is identified, image 7/3. This is suspicious for subacute to chronic infarct. Vascular: No hyperdense vessel or unexpected calcification. Skull: Normal. Negative for fracture or focal lesion. Sinuses/Orbits: No acute finding. Other: None. IMPRESSION: 1. No acute intracranial abnormalities. 2. Focal area of low density within the left cerebellar hemisphere is suspicious for subacute to chronic infarct. Electronically Signed   By: Kerby Moors M.D.   On: 02/15/2022 13:20    CARDIAC  CATHETERIZATION   Result Date: 02/10/2022   Ost RCA to Prox RCA lesion is 20% stenosed.   Prox RCA to Mid RCA lesion is 40% stenosed.   Mid LAD lesion is 40% stenosed.   The left ventricular ejection fraction is 55-65% by visual estimate. Findings: Ao = 139/74 LV = 136/12 RA = 9 RV = 46/10 PA = 50/10 (26) PCW = 12 (no significant v waves) Fick cardiac output/index = 12.2/6.8 PVR = 1.8 WU Ao sat = 99% PA sat = 79%, 80% SVC sat = 85% Assessment: 1. Mild non-obstructive CAD 2. EF 60-65% 3. Severe MR by echo. No significant v-waves in PCWP tracing 4. High cardiac output - may be overestimated by Fick Plan/Discussion: Will plan to wean vent. TCTS consulted for MVR. Glori Bickers, MD 4:27 PM   DG CHEST PORT 1 VIEW   Result Date: 02/13/2022 CLINICAL DATA:  Endotracheal tube placement. EXAM: PORTABLE CHEST 1 VIEW COMPARISON:  Feb 12, 2022. FINDINGS: Stable cardiomegaly. Endotracheal and nasogastric tubes are in grossly good position. Right internal jugular catheter is in good position. Bibasilar atelectasis or edema is noted with associated pleural effusions. Bony thorax is unremarkable. IMPRESSION: Stable support apparatus. Stable bibasilar atelectasis or edema is noted with associated pleural effusions. Electronically Signed   By: Marijo Conception M.D.   On: 02/13/2022 09:23    DG CHEST PORT 1 VIEW   Result Date: 02/12/2022 CLINICAL DATA:  ETT assessment EXAM: PORTABLE CHEST 1 VIEW COMPARISON:  Feb 11, 2022 FINDINGS: The ETT terminates 3 cm above the carina. The right central line terminates in the central SVC. The NG tube terminates below today's film. No pneumothorax. Bilateral effusions, right greater than left. Atelectasis underlying the effusions. Mild interstitial prominence without overt edema. No change in the cardiomediastinal silhouette which is partially obscured by transcutaneous pacer leads. IMPRESSION: 1. Support apparatus as above. 2. Right greater than left pleural effusions with underlying  atelectasis. 3. Mild pulmonary venous congestion. Electronically Signed   By: Dorise Bullion III M.D.   On: 02/12/2022 08:20    DG CHEST PORT 1 VIEW   Result Date: 02/11/2022 CLINICAL DATA:  56 year old female intubated. Cardiogenic shock. EXAM: PORTABLE CHEST 1 VIEW COMPARISON:  Portable chest 02/08/2022 and earlier. FINDINGS: Portable AP semi upright view at 0734 hours. Stable right IJ central line. ETT tip in good position between the clavicles and carina. Enteric tube courses  to the abdomen, tip not included. Pacer or resuscitation pads now project over the chest. Ongoing cardiomegaly, cardiac contour raising the possibility of pericardial effusion. Other mediastinal contours are within normal limits. No pneumothorax. Regressed but not resolved diffuse pulmonary interstitial opacity, apical pulmonary vascularity now appears normal. Continued veiling and confluent opacity at both lung bases. IMPRESSION: 1. Satisfactory visible lines and tubes. 2. Cardiomegaly, consider pericardial effusion. 3. Regressed but not resolved pulmonary interstitial opacity, favor regressed pulmonary edema. 4. Continued bibasilar opacity which probably reflects combination of pleural effusions and lower lobe collapse or consolidation. Electronically Signed   By: Genevie Ann M.D.   On: 02/11/2022 08:00    DG Chest Port 1 View   Result Date: 02/08/2022 CLINICAL DATA:  Nasogastric tube placement and central line placement EXAM: PORTABLE CHEST 1 VIEW COMPARISON:  Earlier today at 2:41 p.m. FINDINGS: 3:57 p.m. Endotracheal tube terminates 2.2 cm above carina. Nasogastric tube extends beyond the inferior aspect of the film. Right internal jugular line tip is favored to terminate at the high right atrium or cavoatrial junction. Numerous leads and wires project over the chest. Patient is rotated left. Mild cardiomegaly. Probable small bilateral pleural effusions. No pneumothorax. Interstitial and airspace disease is symmetric, lower lung  predominant, and progressive. IMPRESSION: Mild position degradation. Right internal jugular line tip is favored to terminate at the high right atrium or cavoatrial junction. No pneumothorax. Nasogastric tube extends beyond the inferior aspect of the film. Progressive interstitial and airspace disease, likely pulmonary edema. Electronically Signed   By: Abigail Miyamoto M.D.   On: 02/08/2022 16:17    DG CHEST PORT 1 VIEW   Result Date: 02/08/2022 CLINICAL DATA:  Shortness of breath EXAM: PORTABLE CHEST 1 VIEW COMPARISON:  02/07/2022 FINDINGS: Endotracheal tube is 3 cm above the carina. Right central line tip at the cavoatrial junction. No pneumothorax. Cardiomegaly. Diffuse bilateral interstitial and airspace opacities. This could reflect edema or infection. No visible effusions or pneumothorax. No acute bony abnormality. IMPRESSION: Cardiomegaly. Diffuse bilateral interstitial and alveolar opacities could reflect edema or infection. Electronically Signed   By: Rolm Baptise M.D.   On: 02/08/2022 15:12    DG Chest Port 1 View   Result Date: 02/07/2022 CLINICAL DATA:  Tachycardia. EXAM: PORTABLE CHEST 1 VIEW COMPARISON:  Chest x-ray 07/02/2021. FINDINGS: Heart is enlarged, unchanged. There are small bilateral pleural effusions. There is some strandy opacities in the lung bases. No pneumothorax or acute fracture. IMPRESSION: 1. Cardiomegaly with small pleural effusions and minimal bibasilar atelectasis/edema. Electronically Signed   By: Ronney Asters M.D.   On: 02/07/2022 19:45    DG Abd Portable 1V   Result Date: 02/15/2022 CLINICAL DATA:  Feeding tube placement EXAM: PORTABLE ABDOMEN - 1 VIEW COMPARISON:  02/16/2020 FINDINGS: Feeding tube enters the stomach with the tip near the gastric antrum. Normal bowel gas pattern Cardiac enlargement. IMPRESSION: Feeding tube tip gastric antrum. Electronically Signed   By: Franchot Gallo M.D.   On: 02/15/2022 13:17    DG Abd Portable 1V   Result Date:  02/08/2022 CLINICAL DATA:  NG tube placement EXAM: PORTABLE ABDOMEN - 1 VIEW COMPARISON:  06/18/2006 FINDINGS: Tip of enteric tube is seen in the antrum of the stomach. Bowel gas pattern is nonspecific. Pelvis is not included. Haziness in the lower lung fields suggest bilateral pleural effusions. Increased markings in the lower lung fields are noted, more so on the right side suggesting asymmetric pulmonary edema or multifocal pneumonia. IMPRESSION: Tip of enteric tube is seen in  the antrum of the stomach. Electronically Signed   By: Elmer Picker M.D.   On: 02/08/2022 16:15    ECHOCARDIOGRAM COMPLETE   Result Date: 02/08/2022    ECHOCARDIOGRAM REPORT   Patient Name:   Lisa Ayala [facility] Date of Exam: 02/08/2022 Medical Rec #:  [id number]:[id number]       Height:       67.0 in Accession #:    [id number]      Weight:       150.2 lb Date of Birth:  08-14-1966        BSA:          1.791 m Patient Age:    56 years        BP:           124/91 mmHg Patient Gender: F               HR:           69 bpm. Exam Location:  Inpatient Procedure: 2D Echo, Cardiac Doppler and Color Doppler                           STAT ECHO Reported to: Dr Buford Dresser on 02/08/2022 5:30:00 PM. Indications:    Mitral valve insufficiency  History:        Patient has prior history of Echocardiogram examinations, most                 recent 08/25/2021. CHF, Mitral Valve Disease; Arrythmias:Atrial                 Flutter.  Sonographer:    Clayton Lefort RDCS (AE) Referring Phys: [id number] [place]  Sonographer Comments: Echo performed with patient supine and on artificial respirator. IMPRESSIONS  1. Left ventricular ejection fraction, by estimation, is 65 to 70%. The left ventricle has normal function. The left ventricle has no regional wall motion abnormalities. There is moderate concentric left ventricular hypertrophy. Left ventricular diastolic parameters are consistent with Grade III diastolic dysfunction (restrictive).  2. Right  ventricular systolic function is mildly reduced. The right ventricular size is normal. There is mildly elevated pulmonary artery systolic pressure.  3. Left atrial size was severely dilated.  4. Right atrial size was mildly dilated.  5. A small pericardial effusion is present.  6. 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 Trivial mitral stenosis.  7. Tricuspid valve regurgitation is moderate.  8. The aortic valve is tricuspid. There is mild calcification of the aortic valve. Aortic valve regurgitation is mild.  9. The inferior vena cava is dilated in size with <50% respiratory variability, suggesting right atrial pressure of 15 mmHg. Comparison(s): Prior images reviewed side by side. Conclusion(s)/Recommendation(s): No significant changes from prior--discussed with Dr. Gardiner Rhyme. Severe MR. Valves better seen on prior imaging. FINDINGS  Left Ventricle: Left ventricular ejection fraction, by estimation, is 65 to 70%. The left ventricle has normal function. The left ventricle has no regional wall motion abnormalities. The left ventricular internal cavity size was normal in size. There is  moderate concentric left ventricular hypertrophy. Left ventricular diastolic parameters are consistent with Grade III diastolic dysfunction (restrictive). Right Ventricle: The right ventricular size is normal. Right vetricular wall thickness was not well visualized. Right ventricular systolic function is mildly reduced. There is mildly elevated pulmonary artery systolic pressure. The tricuspid regurgitant velocity is 2.69 m/s, and with an assumed right atrial pressure of 15 mmHg, the estimated right ventricular  systolic pressure is 43.9 mmHg. Left Atrium: Left atrial size was severely dilated. Right Atrium: Right atrial size was mildly dilated. Pericardium: A small pericardial effusion is present. Mitral Valve: The mitral valve is rheumatic. There is severe thickening of the mitral valve leaflet(s). Moderately  decreased mobility of the mitral valve leaflets. Severe mitral valve regurgitation, with eccentric posteriorly directed jet. Trivial mitral  valve stenosis. MV peak gradient, 4.5 mmHg. The mean mitral valve gradient is 1.0 mmHg. Tricuspid Valve: The tricuspid valve is not well visualized. Tricuspid valve regurgitation is moderate. Aortic Valve: The aortic valve is tricuspid. There is mild calcification of the aortic valve. Aortic valve regurgitation is mild. Aortic valve mean gradient measures 4.0 mmHg. Aortic valve peak gradient measures 7.8 mmHg. Aortic valve area, by VTI measures 2.10 cm. Pulmonic Valve: The pulmonic valve was not well visualized. Pulmonic valve regurgitation is trivial. No evidence of pulmonic stenosis. Aorta: The aortic arch was not well visualized and the aortic root and ascending aorta are structurally normal, with no evidence of dilitation. Venous: The inferior vena cava is dilated in size with less than 50% respiratory variability, suggesting right atrial pressure of 15 mmHg. IAS/Shunts: The atrial septum is grossly normal.  LEFT VENTRICLE PLAX 2D LVIDd:         3.60 cm     Diastology LVIDs:         2.20 cm     LV e' medial:    5.25 cm/s LV PW:         1.60 cm     LV E/e' medial:  20.0 LV IVS:        1.70 cm     LV e' lateral:   9.31 cm/s LVOT diam:     1.90 cm     LV E/e' lateral: 11.3 LV SV:         48 LV SV Index:   27 LVOT Area:     2.84 cm  LV Volumes (MOD) LV vol d, MOD A2C: 95.1 ml LV vol d, MOD A4C: 90.8 ml LV vol s, MOD A2C: 41.9 ml LV vol s, MOD A4C: 44.1 ml LV SV MOD A2C:     53.2 ml LV SV MOD A4C:     90.8 ml LV SV MOD BP:      49.0 ml RIGHT VENTRICLE RV Basal diam:  3.30 cm RV S prime:     6.87 cm/s TAPSE (M-mode): 1.2 cm LEFT ATRIUM              Index        RIGHT ATRIUM           Index LA diam:        2.80 cm  1.56 cm/m   RA Area:     19.00 cm LA Vol (A2C):   171.0 ml 95.50 ml/m  RA Volume:   49.00 ml  27.37 ml/m LA Vol (A4C):   119.0 ml 66.46 ml/m LA Biplane Vol:  153.0 ml 85.45 ml/m  AORTIC VALVE AV Area (Vmax):    2.15 cm AV Area (Vmean):   2.05 cm AV Area (VTI):     2.10 cm AV Vmax:           140.00 cm/s AV Vmean:          89.000 cm/s AV VTI:            0.228 m AV Peak Grad:      7.8 mmHg AV Mean Grad:  4.0 mmHg LVOT Vmax:         106.00 cm/s LVOT Vmean:        64.500 cm/s LVOT VTI:          0.169 m LVOT/AV VTI ratio: 0.74  AORTA Ao Root diam: 2.90 cm Ao Asc diam:  3.40 cm MITRAL VALVE                  TRICUSPID VALVE MV Area (PHT): 1.55 cm       TR Peak grad:   28.9 mmHg MV Area VTI:   1.36 cm       TR Vmax:        269.00 cm/s MV Peak grad:  4.5 mmHg MV Mean grad:  1.0 mmHg       SHUNTS MV Vmax:       1.06 m/s       Systemic VTI:  0.17 m MV Vmean:      50.6 cm/s      Systemic Diam: 1.90 cm MV Decel Time: 488 msec MR Peak grad:    100.8 mmHg MR Mean grad:    65.0 mmHg MR Vmax:         502.00 cm/s MR Vmean:        378.0 cm/s MR PISA:         6.28 cm MR PISA Eff ROA: 48 mm MR PISA Radius:  1.00 cm MV E velocity: 105.00 cm/s MV A velocity: 28.30 cm/s MV E/A ratio:  3.71 Buford Dresser MD Electronically signed by Buford Dresser MD Signature Date/Time: 02/08/2022/5:55:39 PM    Final     ECHO TEE   Result Date: 02/21/2022    TRANSESOPHOGEAL ECHO REPORT   Patient Name:   Lisa Ayala [facility] Date of Exam: 02/09/2022 Medical Rec #:  [id number]       Height:       67.0 in Accession #:    [id number]      Weight:       148.6 lb Date of Birth:  06-19-1966        BSA:          1.782 m Patient Age:    25 years        BP:           140/73 mmHg Patient Gender: F               HR:           67 bpm. Exam Location:  Inpatient Procedure: 2D Echo, Color Doppler and Cardiac Doppler Indications:     Mitral Regurgitation  History:         Patient has prior history of Echocardiogram examinations, most                  recent 02/08/2022. Arrythmias:Atrial Flutter.  Sonographer:     Raquel Sarna Senior RDCS Referring Phys:  [id number] AMY D CLEGG Diagnosing Phys: Glori Bickers MD  PROCEDURE: After discussion of the risks and benefits of a TEE, an informed consent was obtained from the patient. The transesophogeal probe was passed without difficulty through the esophogus of the patient. Sedation performed by performing physician. The patient developed no complications during the procedure. IMPRESSIONS  1. Left ventricular ejection fraction, by estimation, is 60 to 65%. The left ventricle has normal function. The left ventricle has no regional wall motion abnormalities.  2. Right ventricular systolic function is mildly reduced. The right ventricular size is normal.  3. Left atrial size  was severely dilated. No left atrial/left atrial appendage thrombus was detected.  4. A small pericardial effusion is present. The pericardial effusion is localized near the right atrium and anterior to the right ventricle.  5. 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  6. The aortic valve is tricuspid. Aortic valve regurgitation is mild.  7. There is mild (Grade II) plaque involving the descending aorta. FINDINGS  Left Ventricle: Left ventricular ejection fraction, by estimation, is 60 to 65%. The left ventricle has normal function. The left ventricle has no regional wall motion abnormalities. The left ventricular internal cavity size was normal in size. Right Ventricle: The right ventricular size is normal. No increase in right ventricular wall thickness. Right ventricular systolic function is mildly reduced. Left Atrium: Left atrial size was severely dilated. No left atrial/left atrial appendage thrombus was detected. Right Atrium: Right atrial size was normal in size. Pericardium: A small pericardial effusion is present. The pericardial effusion is localized near the right atrium and anterior to the right ventricle. Mitral Valve: The mitral valve is rheumatic. There is moderate thickening of the mitral valve leaflet(s). Moderately decreased mobility of the mitral valve leaflets. Severe  mitral valve regurgitation, with centrally-directed jet. Tricuspid Valve: The tricuspid valve is normal in structure. Tricuspid valve regurgitation is mild. Aortic Valve: The aortic valve is tricuspid. Aortic valve regurgitation is mild. Pulmonic Valve: The pulmonic valve was grossly normal. Pulmonic valve regurgitation is not visualized. Aorta: The aortic root is normal in size and structure. There is mild (Grade II) plaque involving the descending aorta. IAS/Shunts: No atrial level shunt detected by color flow Doppler.  MR Peak grad:    219.0 mmHg MR Mean grad:    103.0 mmHg MR Vmax:         740.00 cm/s MR Vmean:        455.0 cm/s MR PISA:         3.08 cm MR PISA Eff ROA: 16 mm MR PISA Radius:  0.70 cm Glori Bickers MD Electronically signed by Glori Bickers MD Signature Date/Time: 02/21/2022/10:34:17 AM    Final     VAS US DOPPLER PRE CABG   Result Date: 02/15/2022 PREOPERATIVE VASCULAR EVALUATION Patient Name:  Lisa Ayala [facility]  Date of Exam:   02/15/2022 Medical Rec #: [id number]        Accession #:    [id number] Date of Birth: [birth year]         Patient Gender: F Patient Age:   42 years Exam Location:  [facility] Procedure:      VAS US DOPPLER PRE CABG Referring Phys: Remo Lipps HENDRICKSON --------------------------------------------------------------------------------  Indications:      Pre-CABG. Other Factors:    MV disease. Limitations:      Right bandaging/line Comparison Study: No prior study Performing Technologist: Maudry Mayhew MHA, RDMS, RVT, RDCS  Examination Guidelines: A complete evaluation includes B-mode imaging, spectral Doppler, color Doppler, and power Doppler as needed of all accessible portions of each vessel. Bilateral testing is considered an integral part of a complete examination. Limited examinations for reoccurring indications may be performed as noted.  Right Carotid Findings: +----------+--------+--------+--------+--------+--------+           PSV cm/sEDV  cm/sStenosisDescribeComments +----------+--------+--------+--------+--------+--------+ CCA Prox  57      13                               +----------+--------+--------+--------+--------+--------+ ICA Prox  62  11                               +----------+--------+--------+--------+--------+--------+ ICA Distal63      16                               +----------+--------+--------+--------+--------+--------+ +----------+--------+-------+----------------+------------+           PSV cm/sEDV cmsDescribe        Arm Pressure +----------+--------+-------+----------------+------------+ Subclavian120            Multiphasic, WNL             +----------+--------+-------+----------------+------------+ +---------+--------+---+--------+--+---------+ VertebralPSV cm/s103EDV cm/s17Antegrade +---------+--------+---+--------+--+---------+ Left Carotid Findings: +----------+--------+-------+--------+----------------------+------------------+           PSV cm/sEDV    StenosisDescribe              Comments                             cm/s                                                    +----------+--------+-------+--------+----------------------+------------------+ CCA Prox  117     16                                                      +----------+--------+-------+--------+----------------------+------------------+ CCA Distal81      22                                   intimal thickening +----------+--------+-------+--------+----------------------+------------------+ ICA Prox  47      12             heterogenous and                                                          smooth                                   +----------+--------+-------+--------+----------------------+------------------+ ICA Distal62      24                                                       +----------+--------+-------+--------+----------------------+------------------+ ECA       55      8                                                       +----------+--------+-------+--------+----------------------+------------------+ +----------+--------+--------+----------------+------------+  SubclavianPSV cm/sEDV cm/sDescribe        Arm Pressure +----------+--------+--------+----------------+------------+           163             Multiphasic, WNL             +----------+--------+--------+----------------+------------+ +---------+--------+--+--------+--+---------+ VertebralPSV cm/s53EDV cm/s19Antegrade +---------+--------+--+--------+--+---------+  ABI Findings: +--------+------------------+-----+---------+--------+ Right   Rt Pressure (mmHg)IndexWaveform Comment  +--------+------------------+-----+---------+--------+ Brachial                       triphasic         +--------+------------------+-----+---------+--------+ +--------+------------------+-----+---------+-------+ Left    Lt Pressure (mmHg)IndexWaveform Comment +--------+------------------+-----+---------+-------+ [id number]                    triphasic        +--------+------------------+-----+---------+-------+  Right Doppler Findings: +-----------+--------+-----+---------+-----------------------------------------+ Site       PressureIndexDoppler  Comments                                  +-----------+--------+-----+---------+-----------------------------------------+ Brachial                triphasic                                          +-----------+--------+-----+---------+-----------------------------------------+ Radial                  triphasic                                          +-----------+--------+-----+---------+-----------------------------------------+ Ulnar                   triphasic                                           +-----------+--------+-----+---------+-----------------------------------------+ Palmar Arch                      Signal obliterates with radial                                             compression, is unaffected with ulnar                                      comperssion                               +-----------+--------+-----+---------+-----------------------------------------+  Left Doppler Findings: +-----------+--------+-----+---------+--------------------+ Site       PressureIndexDoppler  Comments             +-----------+--------+-----+---------+--------------------+ Brachial   125          triphasic                     +-----------+--------+-----+---------+--------------------+ Radial                  triphasic                     +-----------+--------+-----+---------+--------------------+  Ulnar                   triphasic                     +-----------+--------+-----+---------+--------------------+ Palmar Arch                      Within normal limits +-----------+--------+-----+---------+--------------------+  Summary: Right Carotid: Velocities in the right ICA are consistent with a 1-39% stenosis.                Limited visualization secondary to bandaging. Left Carotid: Velocities in the left ICA are consistent with a 1-39% stenosis. Vertebrals:  Bilateral vertebral arteries demonstrate antegrade flow. Subclavians: Normal flow hemodynamics were seen in bilateral subclavian              arteries.  Electronically signed by Harold Barban MD on 02/15/2022 at 8:52:49 PM.    Final     VAS [place] UPPER EXTREMITY VENOUS DUPLEX   Result Date: 02/15/2022 UPPER VENOUS STUDY  Patient Name:  Lisa Ayala [facility]  Date of Exam:   02/15/2022 Medical Rec #: :[id number]        Accession #:    [id number] Date of Birth: 1966/09/11         Patient Gender: F Patient Age:   85 years Exam Location:  [facility] Procedure:      VAS [place] UPPER EXTREMITY VENOUS DUPLEX Referring  Phys: Ina Homes --------------------------------------------------------------------------------  Indications: Edema Comparison Study: No prior study Performing Technologist: Maudry Mayhew MHA, RDMS, RVT, RDCS  Examination Guidelines: A complete evaluation includes B-mode imaging, spectral Doppler, color Doppler, and power Doppler as needed of all accessible portions of each vessel. Bilateral testing is considered an integral part of a complete examination. Limited examinations for reoccurring indications may be performed as noted.  Right Findings: +----------+------------+---------+-----------+----------+------------+ RIGHT     CompressiblePhasicitySpontaneousProperties  Summary    +----------+------------+---------+-----------+----------+------------+ IJV                                Yes              line present +----------+------------+---------+-----------+----------+------------+ Subclavian    Full       Yes       Yes                           +----------+------------+---------+-----------+----------+------------+ Axillary      Full       Yes       Yes                           +----------+------------+---------+-----------+----------+------------+ Brachial      Full       Yes       Yes                           +----------+------------+---------+-----------+----------+------------+ Radial        Full                                               +----------+------------+---------+-----------+----------+------------+ Ulnar         Full                                               +----------+------------+---------+-----------+----------+------------+  Cephalic      Full                                               +----------+------------+---------+-----------+----------+------------+ Basilic       None                                     Acute     +----------+------------+---------+-----------+----------+------------+  Left Findings:  +----------+------------+---------+-----------+----------+-------+ LEFT      CompressiblePhasicitySpontaneousPropertiesSummary +----------+------------+---------+-----------+----------+-------+ Subclavian               Yes       Yes                      +----------+------------+---------+-----------+----------+-------+  Summary:  Right: No evidence of deep vein thrombosis in the upper extremity. Findings consistent with acute superficial vein thrombosis involving the right basilic vein.  Left: No evidence of thrombosis in the subclavian.  *See table(s) above for measurements and observations.  Diagnosing physician: Harold Barban MD Electronically signed by Harold Barban MD on 02/15/2022 at 8:52:39 PM.    Final     US Abdomen Limited RUQ (LIVER/GB)   Result Date: 02/17/2022 CLINICAL DATA:  Cholecystitis. EXAM: ULTRASOUND ABDOMEN LIMITED RIGHT UPPER QUADRANT COMPARISON:  None Available. FINDINGS: Gallbladder: No gallstones or wall thickening visualized. No sonographic Murphy sign noted by sonographer. Common bile duct: Diameter: 6 mm. Upper limits of normal. No intrahepatic bile duct dilatation. Liver: No focal lesion identified. Within normal limits in parenchymal echogenicity. Portal vein is patent on color Doppler imaging with normal direction of blood flow towards the liver. Other: None. IMPRESSION: 1. No acute findings. No evidence for cholecystitis. 2. Common bile duct is upper limits of normal measuring 6 mm. Electronically Signed   By: Kerby Moors M.D.   On: 02/17/2022 15:13     Assessment & Plan:   Problem List Items Addressed This Visit       Cardiovascular and Mediastinum   Severe mitral regurgitation    Patient with significant mitral and tricuspid regurgitation she has mild aortic insufficiency and the plan apparently is to replace all 3 heart valves at [facility] August 10  I am concerned about periodontal disease and I would like her cleared by dentistry  first  I called the [facility] and they are willing to see this patient today given the fact she is yet to achieve Medicaid it is pending  I spoke to Dr. Jeffie Pollock who understands and agrees with this plan she will get a Panorex at the dental clinic should be seen ASAP  The patient will not make changes in current medications and she has refills in process and is compliant with all medicines listed        Chronic diastolic heart failure ([place])    Currently compensated at this time no change in medication  Blood pressure is elevated this visit but was normal 3 days ago we will monitor        Nervous and Auditory   RESOLVED: Anoxic brain injury ([place])    This has resolved        Other   RESOLVED: Transaminitis    Resolved      RESOLVED: Malnutrition of moderate degree    Resolved  We will obtain colonoscopy and mammogram and Pap smear and give tetanus vaccine after planned surgical interventions  Return in about 2 months (around 06/12/2022).   Shan Levans, MD

## 2022-04-11 NOTE — Assessment & Plan Note (Signed)
Currently compensated at this time no change in medication  Blood pressure is elevated this visit but was normal 3 days ago we will monitor

## 2022-04-11 NOTE — Assessment & Plan Note (Signed)
Resolved

## 2022-04-11 NOTE — Assessment & Plan Note (Signed)
Patient with significant mitral and tricuspid regurgitation she has mild aortic insufficiency and the plan apparently is to replace all 3 heart valves at [facility] August 10  I am concerned about periodontal disease and I would like her cleared by dentistry first  I called the [facility] and they are willing to see this patient today given the fact she is yet to achieve Medicaid it is pending  I spoke to Dr. Teressa Lower who understands and agrees with this plan she will get a Panorex at the dental clinic should be seen ASAP  The patient will not make changes in current medications and she has refills in process and is compliant with all medicines listed

## 2022-04-11 NOTE — Progress Notes (Signed)
New Patient Office Visit  Subjective    Patient ID: Lisa Ayala, female    DOB: September 17, 1966  Age: 56 y.o. MRN: [facility]  CC:  Chief Complaint  Patient presents with   Hospitalization Follow-up   Establish Care    HPI Lisa [facility]ala presents to establish care and for post hospital follow-up This patient was admitted for a prolonged period of time in mid May with documentations as below for the rehab portion of the stay and also recapitulation of the hospital stay prior to the rehab.  This patient has significant mitral tricuspid and aortic valve disease and will need replacements of all the valves along with a Maze procedure and atrial appendage clipping on the left atrium.  This is all to be done at [facility].  Patient is seen today for primary care to establish.  She did see one of our nurse practitioners in June briefly.  She has since seen cardiology as documented below. Seen by NP nichols 02/2022 Hospital Admission: 02/21/22   Hospital Course: Lisa Ayala was admitted to rehab 02/21/2022 for inpatient therapies to consist of PT, ST and OT at least three hours five days a week. Past admission physiatrist, therapy team and rehab RN have worked together to provide customized collaborative inpatient rehab.  Of CBC showed H&H to be relatively stable and leukocytosis had resolved.  She has been afebrile during her stay and respiratory status has been stable.  Mild AKI noted and stable.  Her blood pressures were monitored on TID basis and have been stable.  No cardiac symptoms reported with increase in activity.  No signs of overload noted.  Dapagliflozin was added at discharge per cardiology recommendations.  She continues on trazodone to help manage sleep-wake disruption.  She made great gains during her rehab stay and is modified independent.  No further therapy recommended at this time.  She is to follow-up with cardiology for input on cardiac rehab.  She was discharged to  home in improved condition.      Rehab course: During patient's stay in rehab team conference was held to discuss patient's progress, set goals and barriers to discharge. At admission, patient required supervision with mobility and basic ADL tasks. Cognistat was [facility]ala with exception of one-point below normal for short-term recall with reports of mild memory change. She she is able to complete ADL tasks at modified independent level.  She is modified independent for transfers and is able to ambulate 1400 feet without rest breaks.  She is able to complete mildly complex for functional tasks at modified independent level.  Daughter will provide supervision and assistance as needed after discharge.     HPI   Lisa Ayala is a 56 y.o. female with history of PSVT, severe MR, moderate TR and mild to moderate AI, anxiety disorder, regular marijuana use, cocaine use.    Patient presents today for a hospital follow-up.  Please see hospital notes above.  Patient states that she has been doing well since hospital discharge.  Her daughter is here with her today and is helping care for her.  Patient is ambulating independently.  She states that she was able to get all of her medications at hospital discharge and has been taking them as directed.  Patient does have her follow-up appointments already scheduled.  She will be seeing cardiology next week and she also has an appointment to establish [facility]  the month.  Patient also has an appointment scheduled with cardiothoracic surgery for further evaluation and possible work-up for mitral valve replacement. Denies f/c/s, n/v/d, hemoptysis, PND, leg swelling Denies chest pain or edema   Hosp DC 01/2022 Admit date: 02/21/2022 Discharge date: 02/24/2022   Discharge Diagnoses:  Principal Problem:   Anoxic brain injury [facility]) Active Problems:   Severe mitral regurgitation   Chronic diastolic heart failure  [facility])     Discharged Condition: stable     Brief HPI:   Lisa Ayala is a 56 y.o. female with history of PSVT, severe MR, moderate TR and mild to moderate AI, anxiety disorder, regular marijuana use who was admitted on 02/07/2022 via cardiology office with reports of tachycardia and rapid breathing with SOB.  She was found to have SVT with a flutter, was treated with Herbert Pun sent and started on IV heparin with plans for TEE.  She did develop hypoxia with acute on chronic diastolic heart failure, demand ischemia and found to have transaminitis as well as UDS positive for THC and cocaine.  She was treated with IV diuresis and started on amiodarone due to ongoing issues with a flutter.   She did have worsening of respiratory status prior to TEE requiring intubation and pressors but deteriorated to PEA arrest requiring CPR and epi x1 minute with ROSC.  Hospital course significant for issues with agitation, SVT with heart rates up to 200 requiring emergent DCCV on 05/19 and 05/20 as well as fevers due to PNA requiring Zosyn.  Dr. Roxan Hockey was consulted for input and recommended MVR once medically optimized.  She tolerated extubation on 05/23 and was placed on regular diet.  PT/OT evaluation completed and patient noted to have weakness with balance deficits.  CIR was recommended due to functional decline.     Hospital Course: Lisa [facility]ala was admitted to rehab 02/21/2022 for inpatient therapies to consist of PT, ST and OT at least three hours five days a week. Past admission physiatrist, therapy team and rehab RN have worked together to provide customized collaborative inpatient rehab.  Of CBC showed H&H to be relatively stable and leukocytosis had resolved.  She has been afebrile during her stay and respiratory status has been stable.  Mild AKI noted and stable.  Her blood pressures were monitored on TID basis and have been stable.  No cardiac symptoms reported with increase in activity.  No signs of  overload noted.  Dapagliflozin was added at discharge per cardiology recommendations.  She continues on trazodone to help manage sleep-wake disruption.  She made great gains during her rehab stay and is modified independent.  No further therapy recommended at this time.  She is to follow-up with cardiology for input on cardiac rehab.  She was discharged to home in improved condition.      Rehab course: During patient's stay in rehab team conference was held to discuss patient's progress, set goals and barriers to discharge. At admission, patient required supervision with mobility and basic ADL tasks. Cognistat was [facility] with exception of one-point below normal for short-term recall with reports of mild memory change. She she is able to complete ADL tasks at modified independent level.  She is modified independent for transfers and is able to ambulate 1400 feet without rest breaks.  She is able to complete mildly complex for functional tasks at modified independent level.  Daughter will provide supervision and assistance as needed after discharge.   Disposition: Home  Below is heart failure clinic documentations  just this week Saw [facility] 7/14: Lisa Ayala is a 56 y.o. with a history of SVT and severe MR. Echo 08/2021 LVEF 70-75% with severe MR, mild Lisa, at least Mod TR and planned for TEE   Followed by EP for SVT. She was seen 07/2021. TEE had been planned to evaluate MR. She no showed for TEE.     Admitted 6/23 from EP office with SVT vs AFL, HR 145. UDS + cocaine. Given adenosine 6, 12, and 12 in ED. TEE was scheduled but aborted due to flash pulmonary edema.  AHF team consulted. PEA arrested in Endo. CPR , epi, and bicarb given with ROSC. Urgent intubation. Felt to have component of septic shock 2/2 PNA, as well as CS. Given IV lasix, started on milrinone and NE. Transferred to ICU. Underwent L/RHC showing  nonobstructive CAD. RA 9, PA 50/10, mean PCWP 12, CI 6.8.  Went into rapid SVT, and underwent  emergent DCCV. Extubated, and drips weaned. Amio added for rate control. TCTS planning for MVR with MAZE once recovered from acute illness. Hospitalization c/b AKI, anxiety and hypertension. Required CIR for rehab. Discharged home after CIR, weight 141 lbs.   Her for f/u with her daughter. Feeling fine. Denies CP or SOB. Doing all ADLs without problem. Going to store. No palpitations, CP or SOB. No bleeding with Eliquis. Saw Dr. Cheree Ditto scheduled for triple valve surgery on 8/10.       1. HFpEF - Echo (5/23): EF 65-70%, Grade III DD, R mildly reduced, severe MR and rheumatic - Stable NYHA I-II Volume status looks good - Continue Farxiga 10 mg daily - Continue spiro 12.5 mg daily - Continue Entresto 24/26 mg bid. - Continue Toprol XL 12.5 mg daily.   2.  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  - Planning surgery on 05/04/22 with Dr. Cheree Ditto   3 . PAF/SVT - A flutter on admit. Spontaneously converted to SR.  - Multiple episodes of AF during hospitalization. - In NSR - Continue Eliquis 5 mg bid. No bleeding issues. - Continue amiodarone 200 mg bid. She does not do well out of rhythm. - Planning MAZE at time of MVR.    4. Tobacco/Polysubstance abuse  - UDS on admit + cocaine/THC - She is off smoking    Below is documentation from the [place] clinic visit previously his surgery scheduled for August 10 Has MVR/AVR scheduled 8/10 at [facility]  1. Continue your medical care with your established primary care provider and/or cardiologist.  2. Contact Dr. Aundra Millet office at Phone: [phone] and Fax: [phone] directly with any additional questions or concerns.  3. Dr. Cheree Ditto has recommended proceeding with CT surgical procedure to include aortic valve replacement/mitral valve replacement/possible tricuspid valve repair or replacement/MAZE and  placement left atrial appendage clip.  4. Future date of surgery scheduled Dr. Cheree Ditto: 05/04/2022.  5. Take last dose of Eliquis on 04/28/2022  6. Dr. Aundra Millet office will arrange for you to be admitted to [facility] 05/03/2022 for initiation of IV heparin blood thinner preoperatively.  7. You have been provided preoperative chlorhexidine skin wipes and associated instructions for use to begin at the house on the night of 05/02/2022.    Note the patient has significantly poor dentition and this has yet to be assessed.  Patient has no other complaints at this time.  On arrival blood pressure elevated 155/102.  She is very stressed.  She is worried over her surgery.  Note blood pressures were normal at cardiology earlier this week.  Patient is compliant with all her medications.  Patient is accompanied by her daughter who goes with her to all her medical visits.  She is in [place] in Pharmacologist  The patient had memory deficits after her cardiac arrest in the hospital however these are rapidly improving This patient previously used cocaine but recently has been negative     Outpatient Encounter Medications as of 04/11/2022  Medication Sig   amiodarone (PACERONE) 200 MG tablet Take 1 tablet (200 mg total) by mouth 2 (two) times daily.   apixaban (ELIQUIS) 5 MG TABS tablet Take 1 tablet (5 mg total) by mouth 2 (two) times daily.   dapagliflozin propanediol (FARXIGA) 10 MG TABS tablet Take 1 tablet (10 mg total) by mouth daily.   magnesium oxide (MAG-OX) 400 MG tablet Take 1 tablet (400 mg total) by mouth 2 (two) times daily.   metoprolol succinate (TOPROL XL) 25 MG 24 hr tablet Take 0.5 tablets (12.5 mg total) by mouth daily.   Multiple Vitamin (MULTIVITAMIN ADULT PO) Take 1 tablet by mouth daily.   pantoprazole (PROTONIX) 40 MG tablet Take 1 tablet (40 mg total) by mouth at bedtime.   polyethylene glycol powder (GLYCOLAX/MIRALAX) 17 GM/SCOOP  powder Take 17 g by mouth daily dissolved in water.   QUEtiapine (SEROQUEL) 25 MG tablet Take 1 tablet (25 mg total) by mouth at bedtime as needed (sleep).   sacubitril-valsartan (ENTRESTO) 24-26 MG Take 1 tablet by mouth 2 (two) times daily.   spironolactone (ALDACTONE) 25 MG tablet Take 1/2 tablet (12.5 mg total) by mouth daily.   No facility-administered encounter medications on file as of 04/11/2022.    Past Medical History:  Diagnosis Date   Aortic insufficiency    Atrial flutter ([place]) 02/07/2022   PSVT (paroxysmal supraventricular tachycardia) (HCC)    Severe mitral regurgitation    a. 08/2021 Echo: EF 70-75%, no rwma, mod dil LV, GrII DD, nl RV fxn, massively dil LA, mild-mod dil RA, severe MR due to lack of central coaptation of MV leaflets. Mild Lisa. Mod TR. Mild-mod AI.   Tricuspid regurgitation     Past Surgical History:  Procedure Laterality Date   RIGHT/LEFT HEART CATH AND CORONARY ANGIOGRAPHY N/A 02/10/2022   Procedure: RIGHT/LEFT HEART CATH AND CORONARY ANGIOGRAPHY;  Surgeon: Jolaine Artist, MD;  Location: [facility];  Service: Cardiovascular;  Laterality: N/A;   TEE WITHOUT CARDIOVERSION N/A 02/08/2022   Procedure: TRANSESOPHAGEAL ECHOCARDIOGRAM (TEE);  Surgeon: Geralynn Rile, MD;  Location: [facility];  Service: Cardiovascular;  Laterality: N/A;    Family History  Family history unknown: Yes    Social History   Socioeconomic History   Marital status: Divorced    Spouse name: Not on file   Number of children: Not on file   Years of education: Not on file   Highest education level: Not on file  Occupational History   Not on file  Tobacco Use   Smoking status: Never   Smokeless tobacco: Never  Vaping Use   Vaping Use: Never used  Substance and Sexual Activity   Alcohol use: Not Currently   Drug use: Not Currently    Types: Marijuana, Cocaine   Sexual activity: Not Currently  Other Topics Concern   Not on file  Social History  Narrative  Lives locally.  Relatively active.   Social Determinants of Health   Financial Resource Strain: High Risk (03/20/2022)   Overall Financial Resource Strain (CARDIA)    Difficulty of Paying Living Expenses: Very hard  Food Insecurity: Food Insecurity Present (03/06/2022)   Hunger Vital Sign    Worried About Running Out of Food in the Last Year: Sometimes true    Ran Out of Food in the Last Year: Sometimes true  Transportation Needs: Unmet Transportation Needs (03/20/2022)   PRAPARE - Hydrologist (Medical): Yes    Lack of Transportation (Non-Medical): Yes  Physical Activity: Not on file  Stress: Not on file  Social Connections: Not on file  Intimate Partner Violence: Not on file    Review of Systems  Constitutional:  Negative for chills, diaphoresis, fever, malaise/fatigue and weight loss.  HENT:  Negative for congestion, hearing loss, nosebleeds, sore throat and tinnitus.   Eyes:  Negative for blurred vision, photophobia and redness.  Respiratory:  Negative for cough, hemoptysis, sputum production, shortness of breath, wheezing and stridor.   Cardiovascular:  Negative for chest pain, palpitations, orthopnea, claudication, leg swelling and PND.  Gastrointestinal:  Negative for abdominal pain, blood in stool, constipation, diarrhea, heartburn, nausea and vomiting.  Genitourinary:  Negative for dysuria, flank pain, frequency, hematuria and urgency.  Musculoskeletal:  Negative for back pain, falls, joint pain, myalgias and neck pain.  Skin:  Negative for itching and rash.  Neurological:  Negative for dizziness, tingling, tremors, sensory change, speech change, focal weakness, seizures, loss of consciousness, weakness and headaches.  Endo/Heme/Allergies:  Negative for environmental allergies and polydipsia. Does not bruise/bleed easily.  Psychiatric/Behavioral:  Negative for depression, memory loss, substance abuse and suicidal ideas. The patient is  not nervous/anxious and does not have insomnia.         Objective    BP (!) 155/102   Pulse 71   Wt 149 lb 6.4 oz (67.8 kg)   SpO2 100%   BMI 23.40 kg/m   Physical Exam Vitals reviewed.  Constitutional:      Appearance: Normal appearance. She is well-developed. She is not diaphoretic.  HENT:     Head: Normocephalic and atraumatic.     Nose: Nose normal. No nasal deformity, septal deviation, mucosal edema or rhinorrhea.     Right Sinus: No maxillary sinus tenderness or frontal sinus tenderness.     Left Sinus: No maxillary sinus tenderness or frontal sinus tenderness.     Mouth/Throat:     Mouth: Mucous membranes are moist.     Pharynx: No oropharyngeal exudate.     Comments: Extremely poor dentition several carious teeth seen with severe periodontal disease several missing teeth as well Eyes:     General: No scleral icterus.    Conjunctiva/sclera: Conjunctivae normal.     Pupils: Pupils are equal, round, and reactive to light.  Neck:     Thyroid: No thyromegaly.     Vascular: No carotid bruit or JVD.     Trachea: Trachea normal. No tracheal tenderness or tracheal deviation.  Cardiovascular:     Rate and Rhythm: Normal rate and regular rhythm.     Chest Wall: PMI is not displaced.     Pulses: Normal pulses. No decreased pulses.     Heart sounds: S1 normal and S2 normal. Heart sounds not distant. Murmur heard.     No systolic murmur is present.     No diastolic murmur is present.     No friction  rub. No gallop. No S3 or S4 sounds.  Pulmonary:     Effort: Pulmonary effort is normal. No tachypnea, accessory muscle usage or respiratory distress.     Breath sounds: Normal breath sounds. No stridor. No decreased breath sounds, wheezing, rhonchi or rales.  Chest:     Chest wall: No tenderness.  Abdominal:     General: Bowel sounds are normal. There is no distension.     Palpations: Abdomen is soft. Abdomen is not rigid.     Tenderness: There is no abdominal tenderness.  There is no guarding or rebound.  Musculoskeletal:        General: Normal range of motion.     Cervical back: Normal range of motion and neck supple. No edema, erythema or rigidity. No muscular tenderness. Normal range of motion.  Lymphadenopathy:     Head:     Right side of head: No submental or submandibular adenopathy.     Left side of head: No submental or submandibular adenopathy.     Cervical: No cervical adenopathy.  Skin:    General: Skin is warm and dry.     Coloration: Skin is not pale.     Findings: No rash.     Nails: There is no clubbing.  Neurological:     Mental Status: She is alert and oriented to person, place, and time.     Sensory: No sensory deficit.  Psychiatric:        Speech: Speech normal.        Behavior: Behavior normal.        Significant Diagnostic Studies: Cardiac Studies: - R/LHC (5/23): mild non-obs CAD, EF 60-65%, severe MR by echo, no significant v-waves in PCWP tracing, high cardiac output - may be overestimated by Fick     Ost RCA to Prox RCA lesion is 20% stenosed.   Prox RCA to Mid RCA lesion is 40% stenosed.   Mid LAD lesion is 40% stenosed.   The left ventricular ejection fraction is 55-65% by visual estimate.   Ao = 139/74 LV = 136/12 RA = 9 RV = 46/10 PA = 50/10 (26) PCW = 12 (no significant v waves) Fick cardiac output/index = 12.2/6.8 PVR = 1.8 WU Ao sat = 99% PA sat = 79%, 80% SVC sat = 85%   Plan/Discussion:  CTS consulted for MVR.    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 EXAM: ABDOMEN - 1 VIEW COMPARISON:  Radiograph 01/12/2022 FINDINGS: Feeding 2 with tip in the proximal duodenum. Increasing gaseous distention of the colon. Transverse colon measures  6 cm which is within normal limits. There is gas in the rectum. IMPRESSION: 1. Feeding tube in first portion duodenum. 2. Gas distended colon without evidence obstruction. Electronically Signed   By: Suzy Bouchard M.D.   On: 02/17/2022 14:25    DG Abd 1 View   Result Date: 02/15/2022 CLINICAL DATA:  Ileus EXAM: ABDOMEN - 1 VIEW COMPARISON:  Portable exam 0820 hours compared to 02/08/2022 FINDINGS: Nonobstructive bowel gas pattern. No bowel dilatation or bowel wall thickening. Improved atelectasis versus consolidation LEFT lower lobe. No urinary tract calcification. Levoconvex thoracolumbar scoliosis. IMPRESSION: No acute abnormalities. Electronically Signed   By: Elta Guadeloupe  Thornton Papas M.D.   On: 02/15/2022 08:27    CT HEAD WO CONTRAST (5MM)   Result Date: 02/15/2022 CLINICAL DATA:  Altered mental status. EXAM: CT HEAD WITHOUT CONTRAST TECHNIQUE: Contiguous axial images were obtained from the base of the skull through the vertex without intravenous contrast. RADIATION DOSE REDUCTION: This exam was performed according to the departmental dose-optimization program which includes automated exposure control, adjustment of the mA and/or kV according to patient size and/or use of iterative reconstruction technique. COMPARISON:  None Available. FINDINGS: Brain: No evidence of acute infarction, hemorrhage, hydrocephalus, extra-axial collection or mass lesion/mass effect. Focal area of low density within the left cerebellar hemisphere is identified, image 7/3. This is suspicious for subacute to chronic infarct. Vascular: No hyperdense vessel or unexpected calcification. Skull: Normal. Negative for fracture or focal lesion. Sinuses/Orbits: No acute finding. Other: None. IMPRESSION: 1. No acute intracranial abnormalities. 2. Focal area of low density within the left cerebellar hemisphere is suspicious for subacute to chronic infarct. Electronically Signed   By: Kerby Moors M.D.   On: 02/15/2022 13:20    CARDIAC  CATHETERIZATION   Result Date: 02/10/2022   Ost RCA to Prox RCA lesion is 20% stenosed.   Prox RCA to Mid RCA lesion is 40% stenosed.   Mid LAD lesion is 40% stenosed.   The left ventricular ejection fraction is 55-65% by visual estimate. Findings: Ao = 139/74 LV = 136/12 RA = 9 RV = 46/10 PA = 50/10 (26) PCW = 12 (no significant v waves) Fick cardiac output/index = 12.2/6.8 PVR = 1.8 WU Ao sat = 99% PA sat = 79%, 80% SVC sat = 85% Assessment: 1. Mild non-obstructive CAD 2. EF 60-65% 3. Severe MR by echo. No significant v-waves in PCWP tracing 4. High cardiac output - may be overestimated by Fick Plan/Discussion: Will plan to wean vent. TCTS consulted for MVR. Glori Bickers, MD 4:27 PM   DG CHEST PORT 1 VIEW   Result Date: 02/13/2022 CLINICAL DATA:  Endotracheal tube placement. EXAM: PORTABLE CHEST 1 VIEW COMPARISON:  Feb 12, 2022. FINDINGS: Stable cardiomegaly. Endotracheal and nasogastric tubes are in grossly good position. Right internal jugular catheter is in good position. Bibasilar atelectasis or edema is noted with associated pleural effusions. Bony thorax is unremarkable. IMPRESSION: Stable support apparatus. Stable bibasilar atelectasis or edema is noted with associated pleural effusions. Electronically Signed   By: Marijo Conception M.D.   On: 02/13/2022 09:23    DG CHEST PORT 1 VIEW   Result Date: 02/12/2022 CLINICAL DATA:  ETT assessment EXAM: PORTABLE CHEST 1 VIEW COMPARISON:  Feb 11, 2022 FINDINGS: The ETT terminates 3 cm above the carina. The right central line terminates in the central SVC. The NG tube terminates below today's film. No pneumothorax. Bilateral effusions, right greater than left. Atelectasis underlying the effusions. Mild interstitial prominence without overt edema. No change in the cardiomediastinal silhouette which is partially obscured by transcutaneous pacer leads. IMPRESSION: 1. Support apparatus as above. 2. Right greater than left pleural effusions with underlying  atelectasis. 3. Mild pulmonary venous congestion. Electronically Signed   By: Dorise Bullion III M.D.   On: 02/12/2022 08:20    DG CHEST PORT 1 VIEW   Result Date: 02/11/2022 CLINICAL DATA:  56 year old female intubated. Cardiogenic shock. EXAM: PORTABLE CHEST 1 VIEW COMPARISON:  Portable chest 02/08/2022 and earlier. FINDINGS: Portable AP semi upright view at 0734 hours. Stable right IJ central line. ETT tip in good position between the clavicles and carina. Enteric tube courses  to the abdomen, tip not included. Pacer or resuscitation pads now project over the chest. Ongoing cardiomegaly, cardiac contour raising the possibility of pericardial effusion. Other mediastinal contours are within normal limits. No pneumothorax. Regressed but not resolved diffuse pulmonary interstitial opacity, apical pulmonary vascularity now appears normal. Continued veiling and confluent opacity at both lung bases. IMPRESSION: 1. Satisfactory visible lines and tubes. 2. Cardiomegaly, consider pericardial effusion. 3. Regressed but not resolved pulmonary interstitial opacity, favor regressed pulmonary edema. 4. Continued bibasilar opacity which probably reflects combination of pleural effusions and lower lobe collapse or consolidation. Electronically Signed   By: Genevie Ann M.D.   On: 02/11/2022 08:00    DG Chest Port 1 View   Result Date: 02/08/2022 CLINICAL DATA:  Nasogastric tube placement and central line placement EXAM: PORTABLE CHEST 1 VIEW COMPARISON:  Earlier today at 2:41 p.m. FINDINGS: 3:57 p.m. Endotracheal tube terminates 2.2 cm above carina. Nasogastric tube extends beyond the inferior aspect of the film. Right internal jugular line tip is favored to terminate at the high right atrium or cavoatrial junction. Numerous leads and wires project over the chest. Patient is rotated left. Mild cardiomegaly. Probable small bilateral pleural effusions. No pneumothorax. Interstitial and airspace disease is symmetric, lower lung  predominant, and progressive. IMPRESSION: Mild position degradation. Right internal jugular line tip is favored to terminate at the high right atrium or cavoatrial junction. No pneumothorax. Nasogastric tube extends beyond the inferior aspect of the film. Progressive interstitial and airspace disease, likely pulmonary edema. Electronically Signed   By: Abigail Miyamoto M.D.   On: 02/08/2022 16:17    DG CHEST PORT 1 VIEW   Result Date: 02/08/2022 CLINICAL DATA:  Shortness of breath EXAM: PORTABLE CHEST 1 VIEW COMPARISON:  02/07/2022 FINDINGS: Endotracheal tube is 3 cm above the carina. Right central line tip at the cavoatrial junction. No pneumothorax. Cardiomegaly. Diffuse bilateral interstitial and airspace opacities. This could reflect edema or infection. No visible effusions or pneumothorax. No acute bony abnormality. IMPRESSION: Cardiomegaly. Diffuse bilateral interstitial and alveolar opacities could reflect edema or infection. Electronically Signed   By: Rolm Baptise M.D.   On: 02/08/2022 15:12    DG Chest Port 1 View   Result Date: 02/07/2022 CLINICAL DATA:  Tachycardia. EXAM: PORTABLE CHEST 1 VIEW COMPARISON:  Chest x-ray 07/02/2021. FINDINGS: Heart is enlarged, unchanged. There are small bilateral pleural effusions. There is some strandy opacities in the lung bases. No pneumothorax or acute fracture. IMPRESSION: 1. Cardiomegaly with small pleural effusions and minimal bibasilar atelectasis/edema. Electronically Signed   By: Ronney Asters M.D.   On: 02/07/2022 19:45    DG Abd Portable 1V   Result Date: 02/15/2022 CLINICAL DATA:  Feeding tube placement EXAM: PORTABLE ABDOMEN - 1 VIEW COMPARISON:  02/16/2020 FINDINGS: Feeding tube enters the stomach with the tip near the gastric antrum. Normal bowel gas pattern Cardiac enlargement. IMPRESSION: Feeding tube tip gastric antrum. Electronically Signed   By: Franchot Gallo M.D.   On: 02/15/2022 13:17    DG Abd Portable 1V   Result Date:  02/08/2022 CLINICAL DATA:  NG tube placement EXAM: PORTABLE ABDOMEN - 1 VIEW COMPARISON:  06/18/2006 FINDINGS: Tip of enteric tube is seen in the antrum of the stomach. Bowel gas pattern is nonspecific. Pelvis is not included. Haziness in the lower lung fields suggest bilateral pleural effusions. Increased markings in the lower lung fields are noted, more so on the right side suggesting asymmetric pulmonary edema or multifocal pneumonia. IMPRESSION: Tip of enteric tube is seen in  the antrum of the stomach. Electronically Signed   By: Elmer Picker M.D.   On: 02/08/2022 16:15    ECHOCARDIOGRAM COMPLETE   Result Date: 02/08/2022    ECHOCARDIOGRAM REPORT   Patient Name:   Lisa [facility] Date of Exam: 02/08/2022 Medical Rec #:  Lisa [facility]ala       Height:       67.0 in Accession #:    [id number]      Weight:       150.2 lb Date of Birth:  1966/06/28        BSA:          1.791 m Patient Age:    10 years        BP:           124/91 mmHg Patient Gender: F               HR:           69 bpm. Exam Location:  Inpatient Procedure: 2D Echo, Cardiac Doppler and Color Doppler                           STAT ECHO Reported to: Dr Buford Dresser on 02/08/2022 5:30:00 PM. Indications:    Mitral valve insufficiency  History:        Patient has prior history of Echocardiogram examinations, most                 recent 08/25/2021. CHF, Mitral Valve Disease; Arrythmias:Atrial                 Flutter.  Sonographer:    Clayton Lefort RDCS (AE) Referring Phys: [id number] [place]  Sonographer Comments: Echo performed with patient supine and on artificial respirator. IMPRESSIONS  1. Left ventricular ejection fraction, by estimation, is 65 to 70%. The left ventricle has normal function. The left ventricle has no regional wall motion abnormalities. There is moderate concentric left ventricular hypertrophy. Left ventricular diastolic parameters are consistent with Grade III diastolic dysfunction (restrictive).  2. Right  ventricular systolic function is mildly reduced. The right ventricular size is normal. There is mildly elevated pulmonary artery systolic pressure.  3. Left atrial size was severely dilated.  4. Right atrial size was mildly dilated.  5. A small pericardial effusion is present.  6. 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 Trivial mitral stenosis.  7. Tricuspid valve regurgitation is moderate.  8. The aortic valve is tricuspid. There is mild calcification of the aortic valve. Aortic valve regurgitation is mild.  9. The inferior vena cava is dilated in size with <50% respiratory variability, suggesting right atrial pressure of 15 mmHg. Comparison(s): Prior images reviewed side by side. Conclusion(s)/Recommendation(s): No significant changes from prior--discussed with Dr. Gardiner Rhyme. Severe MR. Valves better seen on prior imaging. FINDINGS  Left Ventricle: Left ventricular ejection fraction, by estimation, is 65 to 70%. The left ventricle has normal function. The left ventricle has no regional wall motion abnormalities. The left ventricular internal cavity size was normal in size. There is  moderate concentric left ventricular hypertrophy. Left ventricular diastolic parameters are consistent with Grade III diastolic dysfunction (restrictive). Right Ventricle: The right ventricular size is normal. Right vetricular wall thickness was not well visualized. Right ventricular systolic function is mildly reduced. There is mildly elevated pulmonary artery systolic pressure. The tricuspid regurgitant velocity is 2.69 m/s, and with an assumed right atrial pressure of 15 mmHg, the estimated right ventricular  systolic pressure is 43.9 mmHg. Left Atrium: Left atrial size was severely dilated. Right Atrium: Right atrial size was mildly dilated. Pericardium: A small pericardial effusion is present. Mitral Valve: The mitral valve is rheumatic. There is severe thickening of the mitral valve leaflet(s). Moderately  decreased mobility of the mitral valve leaflets. Severe mitral valve regurgitation, with eccentric posteriorly directed jet. Trivial mitral  valve stenosis. MV peak gradient, 4.5 mmHg. The mean mitral valve gradient is 1.0 mmHg. Tricuspid Valve: The tricuspid valve is not well visualized. Tricuspid valve regurgitation is moderate. Aortic Valve: The aortic valve is tricuspid. There is mild calcification of the aortic valve. Aortic valve regurgitation is mild. Aortic valve mean gradient measures 4.0 mmHg. Aortic valve peak gradient measures 7.8 mmHg. Aortic valve area, by VTI measures 2.10 cm. Pulmonic Valve: The pulmonic valve was not well visualized. Pulmonic valve regurgitation is trivial. No evidence of pulmonic stenosis. Aorta: The aortic arch was not well visualized and the aortic root and ascending aorta are structurally normal, with no evidence of dilitation. Venous: The inferior vena cava is dilated in size with less than 50% respiratory variability, suggesting right atrial pressure of 15 mmHg. IAS/Shunts: The atrial septum is grossly normal.  LEFT VENTRICLE PLAX 2D LVIDd:         3.60 cm     Diastology LVIDs:         2.20 cm     LV e' medial:    5.25 cm/s LV PW:         1.60 cm     LV E/e' medial:  20.0 LV IVS:        1.70 cm     LV e' lateral:   9.31 cm/s LVOT diam:     1.90 cm     LV E/e' lateral: 11.3 LV SV:         48 LV SV Index:   27 LVOT Area:     2.84 cm  LV Volumes (MOD) LV vol d, MOD A2C: 95.1 ml LV vol d, MOD A4C: 90.8 ml LV vol s, MOD A2C: 41.9 ml LV vol s, MOD A4C: 44.1 ml LV SV MOD A2C:     53.2 ml LV SV MOD A4C:     90.8 ml LV SV MOD BP:      49.0 ml RIGHT VENTRICLE RV Basal diam:  3.30 cm RV S prime:     6.87 cm/s TAPSE (M-mode): 1.2 cm LEFT ATRIUM              Index        RIGHT ATRIUM           Index LA diam:        2.80 cm  1.56 cm/m   RA Area:     19.00 cm LA Vol (A2C):   171.0 ml 95.50 ml/m  RA Volume:   49.00 ml  27.37 ml/m LA Vol (A4C):   119.0 ml 66.46 ml/m LA Biplane Vol:  153.0 ml 85.45 ml/m  AORTIC VALVE AV Area (Vmax):    2.15 cm AV Area (Vmean):   2.05 cm AV Area (VTI):     2.10 cm AV Vmax:           140.00 cm/s AV Vmean:          89.000 cm/s AV VTI:            0.228 m AV Peak Grad:      7.8 mmHg AV Mean Grad:  4.0 mmHg LVOT Vmax:         106.00 cm/s LVOT Vmean:        64.500 cm/s LVOT VTI:          0.169 m LVOT/AV VTI ratio: 0.74  AORTA Ao Root diam: 2.90 cm Ao Asc diam:  3.40 cm MITRAL VALVE                  TRICUSPID VALVE MV Area (PHT): 1.55 cm       TR Peak grad:   28.9 mmHg MV Area VTI:   1.36 cm       TR Vmax:        269.00 cm/s MV Peak grad:  4.5 mmHg MV Mean grad:  1.0 mmHg       SHUNTS MV Vmax:       1.06 m/s       Systemic VTI:  0.17 m MV Vmean:      50.6 cm/s      Systemic Diam: 1.90 cm MV Decel Time: 488 msec MR Peak grad:    100.8 mmHg MR Mean grad:    65.0 mmHg MR Vmax:         502.00 cm/s MR Vmean:        378.0 cm/s MR PISA:         6.28 cm MR PISA Eff ROA: 48 mm MR PISA Radius:  1.00 cm MV E velocity: 105.00 cm/s MV A velocity: 28.30 cm/s MV E/A ratio:  3.71 Buford Dresser MD Electronically signed by Buford Dresser MD Signature Date/Time: 02/08/2022/5:55:39 PM    Final     ECHO TEE   Result Date: 02/21/2022    TRANSESOPHOGEAL ECHO REPORT   Patient Name:   Lisa Ayala [facility] Date of Exam: 02/09/2022 Medical Rec #:  Lisa Ayala       Height:       67.0 in Accession #:    [id number]      Weight:       148.6 lb Date of Birth:  1965/11/09        BSA:          1.782 m Patient Age:    24 years        BP:           140/73 mmHg Patient Gender: F               HR:           67 bpm. Exam Location:  Inpatient Procedure: 2D Echo, Color Doppler and Cardiac Doppler Indications:     Mitral Regurgitation  History:         Patient has prior history of Echocardiogram examinations, most                  recent 02/08/2022. Arrythmias:Atrial Flutter.  Sonographer:     Raquel Sarna Senior RDCS Referring Phys:  [phone] AMY D CLEGG Diagnosing Phys: Glori Bickers MD  PROCEDURE: After discussion of the risks and benefits of a TEE, an informed consent was obtained from the patient. The transesophogeal probe was passed without difficulty through the esophogus of the patient. Sedation performed by performing physician. The patient developed no complications during the procedure. IMPRESSIONS  1. Left ventricular ejection fraction, by estimation, is 60 to 65%. The left ventricle has normal function. The left ventricle has no regional wall motion abnormalities.  2. Right ventricular systolic function is mildly reduced. The right ventricular size is normal.  3. Left atrial size  was severely dilated. No left atrial/left atrial appendage thrombus was detected.  4. A small pericardial effusion is present. The pericardial effusion is localized near the right atrium and anterior to the right ventricle.  5. 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  6. The aortic valve is tricuspid. Aortic valve regurgitation is mild.  7. There is mild (Grade II) plaque involving the descending aorta. FINDINGS  Left Ventricle: Left ventricular ejection fraction, by estimation, is 60 to 65%. The left ventricle has normal function. The left ventricle has no regional wall motion abnormalities. The left ventricular internal cavity size was normal in size. Right Ventricle: The right ventricular size is normal. No increase in right ventricular wall thickness. Right ventricular systolic function is mildly reduced. Left Atrium: Left atrial size was severely dilated. No left atrial/left atrial appendage thrombus was detected. Right Atrium: Right atrial size was normal in size. Pericardium: A small pericardial effusion is present. The pericardial effusion is localized near the right atrium and anterior to the right ventricle. Mitral Valve: The mitral valve is rheumatic. There is moderate thickening of the mitral valve leaflet(s). Moderately decreased mobility of the mitral valve leaflets. Severe  mitral valve regurgitation, with centrally-directed jet. Tricuspid Valve: The tricuspid valve is normal in structure. Tricuspid valve regurgitation is mild. Aortic Valve: The aortic valve is tricuspid. Aortic valve regurgitation is mild. Pulmonic Valve: The pulmonic valve was grossly normal. Pulmonic valve regurgitation is not visualized. Aorta: The aortic root is normal in size and structure. There is mild (Grade II) plaque involving the descending aorta. IAS/Shunts: No atrial level shunt detected by color flow Doppler.  MR Peak grad:    219.0 mmHg MR Mean grad:    103.0 mmHg MR Vmax:         740.00 cm/s MR Vmean:        455.0 cm/s MR PISA:         3.08 cm MR PISA Eff ROA: 16 mm MR PISA Radius:  0.70 cm Glori Bickers MD Electronically signed by Glori Bickers MD Signature Date/Time: 02/21/2022/10:34:17 AM    Final     VAS US DOPPLER PRE CABG   Result Date: 02/15/2022 PREOPERATIVE VASCULAR EVALUATION Patient Name:  Lisa Ayala [facility]  Date of Exam:   02/15/2022 Medical Rec #: Lisa Ayala        Accession #:    [id number] Date of Birth: 1966-06-18         Patient Gender: F Patient Age:   64 years Exam Location:  [facility] Procedure:      VAS US DOPPLER PRE CABG Referring Phys: Remo Lipps HENDRICKSON --------------------------------------------------------------------------------  Indications:      Pre-CABG. Other Factors:    MV disease. Limitations:      Right bandaging/line Comparison Study: No prior study Performing Technologist: Maudry Mayhew MHA, RDMS, RVT, RDCS  Examination Guidelines: A complete evaluation includes B-mode imaging, spectral Doppler, color Doppler, and power Doppler as needed of all accessible portions of each vessel. Bilateral testing is considered an integral part of a complete examination. Limited examinations for reoccurring indications may be performed as noted.  Right Carotid Findings: +----------+--------+--------+--------+--------+--------+           PSV cm/sEDV  cm/sStenosisDescribeComments +----------+--------+--------+--------+--------+--------+ CCA Prox  57      13                               +----------+--------+--------+--------+--------+--------+ ICA Prox  62  11                               +----------+--------+--------+--------+--------+--------+ ICA Distal63      16                               +----------+--------+--------+--------+--------+--------+ +----------+--------+-------+----------------+------------+           PSV cm/sEDV cmsDescribe        Arm Pressure +----------+--------+-------+----------------+------------+ Subclavian120            Multiphasic, WNL             +----------+--------+-------+----------------+------------+ +---------+--------+---+--------+--+---------+ VertebralPSV cm/s103EDV cm/s17Antegrade +---------+--------+---+--------+--+---------+ Left Carotid Findings: +----------+--------+-------+--------+----------------------+------------------+           PSV cm/sEDV    StenosisDescribe              Comments                             cm/s                                                    +----------+--------+-------+--------+----------------------+------------------+ CCA Prox  117     16                                                      +----------+--------+-------+--------+----------------------+------------------+ CCA Distal81      22                                   intimal thickening +----------+--------+-------+--------+----------------------+------------------+ ICA Prox  47      12             heterogenous and                                                          smooth                                   +----------+--------+-------+--------+----------------------+------------------+ ICA Distal62      24                                                       +----------+--------+-------+--------+----------------------+------------------+ ECA       55      8                                                       +----------+--------+-------+--------+----------------------+------------------+ +----------+--------+--------+----------------+------------+  SubclavianPSV cm/sEDV cm/sDescribe        Arm Pressure +----------+--------+--------+----------------+------------+           163             Multiphasic, WNL             +----------+--------+--------+----------------+------------+ +---------+--------+--+--------+--+---------+ VertebralPSV cm/s53EDV cm/s19Antegrade +---------+--------+--+--------+--+---------+  ABI Findings: +--------+------------------+-----+---------+--------+ Right   Rt Pressure (mmHg)IndexWaveform Comment  +--------+------------------+-----+---------+--------+ Brachial                       triphasic         +--------+------------------+-----+---------+--------+ +--------+------------------+-----+---------+-------+ Left    Lt Pressure (mmHg)IndexWaveform Comment +--------+------------------+-----+---------+-------+ [id number]                    triphasic        +--------+------------------+-----+---------+-------+  Right Doppler Findings: +-----------+--------+-----+---------+-----------------------------------------+ Site       PressureIndexDoppler  Comments                                  +-----------+--------+-----+---------+-----------------------------------------+ Brachial                triphasic                                          +-----------+--------+-----+---------+-----------------------------------------+ Radial                  triphasic                                          +-----------+--------+-----+---------+-----------------------------------------+ Ulnar                   triphasic                                           +-----------+--------+-----+---------+-----------------------------------------+ Palmar Arch                      Signal obliterates with radial                                             compression, is unaffected with ulnar                                      comperssion                               +-----------+--------+-----+---------+-----------------------------------------+  Left Doppler Findings: +-----------+--------+-----+---------+--------------------+ Site       PressureIndexDoppler  Comments             +-----------+--------+-----+---------+--------------------+ Brachial   125          triphasic                     +-----------+--------+-----+---------+--------------------+ Radial                  triphasic                     +-----------+--------+-----+---------+--------------------+  Ulnar                   triphasic                     +-----------+--------+-----+---------+--------------------+ Palmar Arch                      Within normal limits +-----------+--------+-----+---------+--------------------+  Summary: Right Carotid: Velocities in the right ICA are consistent with a 1-39% stenosis.                Limited visualization secondary to bandaging. Left Carotid: Velocities in the left ICA are consistent with a 1-39% stenosis. Vertebrals:  Bilateral vertebral arteries demonstrate antegrade flow. Subclavians: Normal flow hemodynamics were seen in bilateral subclavian              arteries.  Electronically signed by Harold Barban MD on 02/15/2022 at 8:52:49 PM.    Final     VAS [place] UPPER EXTREMITY VENOUS DUPLEX   Result Date: 02/15/2022 UPPER VENOUS STUDY  Patient Name:  Lisa Ayala [facility]  Date of Exam:   02/15/2022 Medical Rec #: [id number]:[id number]        Accession #:    [id number] Date of Birth: [birth year]         Patient Gender: F Patient Age:   55 years Exam Location:  [facility] Procedure:      VAS [place] UPPER EXTREMITY VENOUS DUPLEX Referring  Phys: Ina Homes --------------------------------------------------------------------------------  Indications: Edema Comparison Study: No prior study Performing Technologist: Maudry Mayhew MHA, RDMS, RVT, RDCS  Examination Guidelines: A complete evaluation includes B-mode imaging, spectral Doppler, color Doppler, and power Doppler as needed of all accessible portions of each vessel. Bilateral testing is considered an integral part of a complete examination. Limited examinations for reoccurring indications may be performed as noted.  Right Findings: +----------+------------+---------+-----------+----------+------------+ RIGHT     CompressiblePhasicitySpontaneousProperties  Summary    +----------+------------+---------+-----------+----------+------------+ IJV                                Yes              line present +----------+------------+---------+-----------+----------+------------+ Subclavian    Full       Yes       Yes                           +----------+------------+---------+-----------+----------+------------+ Axillary      Full       Yes       Yes                           +----------+------------+---------+-----------+----------+------------+ Brachial      Full       Yes       Yes                           +----------+------------+---------+-----------+----------+------------+ Radial        Full                                               +----------+------------+---------+-----------+----------+------------+ Ulnar         Full                                               +----------+------------+---------+-----------+----------+------------+  Cephalic      Full                                               +----------+------------+---------+-----------+----------+------------+ Basilic       None                                     Acute     +----------+------------+---------+-----------+----------+------------+  Left Findings:  +----------+------------+---------+-----------+----------+-------+ LEFT      CompressiblePhasicitySpontaneousPropertiesSummary +----------+------------+---------+-----------+----------+-------+ Subclavian               Yes       Yes                      +----------+------------+---------+-----------+----------+-------+  Summary:  Right: No evidence of deep vein thrombosis in the upper extremity. Findings consistent with acute superficial vein thrombosis involving the right basilic vein.  Left: No evidence of thrombosis in the subclavian.  *See table(s) above for measurements and observations.  Diagnosing physician: Harold Barban MD Electronically signed by Harold Barban MD on 02/15/2022 at 8:52:39 PM.    Final     US Abdomen Limited RUQ (LIVER/GB)   Result Date: 02/17/2022 CLINICAL DATA:  Cholecystitis. EXAM: ULTRASOUND ABDOMEN LIMITED RIGHT UPPER QUADRANT COMPARISON:  None Available. FINDINGS: Gallbladder: No gallstones or wall thickening visualized. No sonographic Murphy sign noted by sonographer. Common bile duct: Diameter: 6 mm. Upper limits of normal. No intrahepatic bile duct dilatation. Liver: No focal lesion identified. Within normal limits in parenchymal echogenicity. Portal vein is patent on color Doppler imaging with normal direction of blood flow towards the liver. Other: None. IMPRESSION: 1. No acute findings. No evidence for cholecystitis. 2. Common bile duct is upper limits of normal measuring 6 mm. Electronically Signed   By: Kerby Moors M.D.   On: 02/17/2022 15:13     Assessment & Plan:   Problem List Items Addressed This Visit       Cardiovascular and Mediastinum   Severe mitral regurgitation    Patient with significant mitral and tricuspid regurgitation she has mild aortic insufficiency and the plan apparently is to replace all 3 heart valves at [facility] [facility] August 10  I am concerned about periodontal disease and I would like her cleared by dentistry  first  I called the [facility] and they are willing to see this patient today given the fact she is yet to achieve Medicaid it is pending  I spoke to Dr. Jeffie Pollock who understands and agrees with this plan she will get a Panorex at the dental clinic should be seen ASAP  The patient will not make changes in current medications and she has refills in process and is compliant with all medicines listed        Chronic diastolic heart failure ([place])    Currently compensated at this time no change in medication  Blood pressure is elevated this visit but was normal 3 days ago we will monitor        Nervous and Auditory   RESOLVED: Anoxic brain injury ([place])    This has resolved        Other   RESOLVED: Transaminitis    Resolved      RESOLVED: Malnutrition of moderate degree    Resolved  We will obtain colonoscopy and mammogram and Pap smear and give tetanus vaccine after planned surgical interventions  Return in about 2 months (around 06/12/2022).   Shan Levans, MD

## 2022-04-17 ENCOUNTER — Encounter (HOSPITAL_COMMUNITY): Payer: Self-pay | Admitting: Dentistry

## 2022-04-17 ENCOUNTER — Ambulatory Visit (INDEPENDENT_AMBULATORY_CARE_PROVIDER_SITE_OTHER): Payer: Self-pay | Admitting: Dentistry

## 2022-04-17 VITALS — BP 145/94 | HR 72 | Temp 98.1°F

## 2022-04-17 DIAGNOSIS — F40232 Fear of other medical care: Secondary | ICD-10-CM

## 2022-04-17 DIAGNOSIS — I051 Rheumatic mitral insufficiency: Secondary | ICD-10-CM

## 2022-04-17 DIAGNOSIS — M270 Developmental disorders of jaws: Secondary | ICD-10-CM | POA: Insufficient documentation

## 2022-04-17 DIAGNOSIS — K029 Dental caries, unspecified: Secondary | ICD-10-CM | POA: Insufficient documentation

## 2022-04-17 DIAGNOSIS — K045 Chronic apical periodontitis: Secondary | ICD-10-CM | POA: Insufficient documentation

## 2022-04-17 DIAGNOSIS — K083 Retained dental root: Secondary | ICD-10-CM | POA: Insufficient documentation

## 2022-04-17 DIAGNOSIS — K08109 Complete loss of teeth, unspecified cause, unspecified class: Secondary | ICD-10-CM | POA: Insufficient documentation

## 2022-04-17 DIAGNOSIS — K036 Deposits [accretions] on teeth: Secondary | ICD-10-CM | POA: Insufficient documentation

## 2022-04-17 DIAGNOSIS — Z01818 Encounter for other preprocedural examination: Secondary | ICD-10-CM | POA: Insufficient documentation

## 2022-04-17 DIAGNOSIS — M264 Malocclusion, unspecified: Secondary | ICD-10-CM | POA: Insufficient documentation

## 2022-04-17 DIAGNOSIS — I5032 Chronic diastolic (congestive) heart failure: Secondary | ICD-10-CM

## 2022-04-17 DIAGNOSIS — K0889 Other specified disorders of teeth and supporting structures: Secondary | ICD-10-CM | POA: Insufficient documentation

## 2022-04-17 DIAGNOSIS — K056 Periodontal disease, unspecified: Secondary | ICD-10-CM | POA: Insufficient documentation

## 2022-04-17 DIAGNOSIS — Z7901 Long term (current) use of anticoagulants: Secondary | ICD-10-CM | POA: Insufficient documentation

## 2022-04-17 HISTORY — DX: Fear of other medical care: F40.232

## 2022-04-17 NOTE — Patient Instructions (Signed)
[facility] DEPARTMENT OF DENTAL MEDICINE Dr. Kacelyn Rowzee B. Karlis Cregg, DMD Phone: [phone] Fax: [phone]       It was a pleasure seeing you today!  Please refer to the information below regarding your dental visit with us.  Please do not hesitate to give us a call if any questions or concerns come up after you leave.    Thank you for letting us provide care for you.  If there is anything we can do for you, please let us know.    HEART VALVES AND MOUTH CARE  FACTS: If you have any infection in your mouth, it can infect your heart valve. If you heart valve is infected, you will be seriously ill. Infections in the mouth can be SILENT and do not always cause pain. Examples of infections in the mouth are gum disease, dental cavities and abscesses. Some possible signs of infection are:  Bad breath, bleeding gums, or teeth that are sensitive to sweets, hot, and/or cold. There are many other signs as well.     WHAT YOU HAVE TO DO: Brush your teeth after meals and at bedtime.  Spend at least 2 minutes brushing well, especially behind your back teeth and all around your teeth that stand alone.  Brush at the gumline also. Do not go to bed without brushing your teeth and flossing.  If your gums bleed when you brush or floss, do NOT stop brushing or flossing.  Bleeding can be a sign of inflammation or irritation from bacteria.  It usually means that your gums need more attention and better cleaning.  If your dentist or Dr. Del Wiseman gave you a prescription mouthwash to use, make sure to use it as directed. If you run out of the medication, notify your pharmacy. If you were given any other medications or directions by your dentist, please follow them.  If you did not understand the directions or forget what you were told, please call.  We will be happy to refresh your memory. If you need antibiotics before dental procedures, make sure you take them one hour prior to  every dental visit as directed.  Get a dental check-up every 4-6 months in order to keep your mouth healthy, or to find and treat any new infection. You will most likely need your teeth cleaned or gums treated at the same time. If you are not able to come in for your scheduled appointment, call your dentist as soon as possible to reschedule. If you have a problem in between dental visits, call your dentist.    WE ARE A TEAM.  OUR GOAL IS: HEALTHY MOUTH, HEALTHY HEART    Questions?  Call our office during office hours at [phone].   

## 2022-04-17 NOTE — Progress Notes (Signed)
Department of Dental Medicine   Service Date:   04/17/2022  Patient Name:  Lisa Ayala Date of Birth:   [birth year] Medical Record Number: [id number]  Referring Provider:           Shan Levans, MD     Arvilla Meres, MD  OUTPATIENT CONSULTATION PLAN/RECOMMENDATIONS   ASSESSMENT: There are no current signs of acute odontogenic infection including abscess, edema or erythema, or suspicious lesion requiring biopsy.   Caries, retained root tips, severe periodontal disease  RECOMMENDATIONS: Extractions of all remaining teeth to decrease the risk of perioperative and postoperative systemic infection and complications. Establish dental care at an outside office of the patient's choice for routine care including replacement of missing teeth as needed.  PLAN: Discuss case with medical team and coordinate treatment as needed.   O.R. on Thursday, August 3rd for full mouth extractions under general anesthesia, pending medical team's recommendations. Hold Eliquis 24 hours prior to surgery and resume 24 hours after surgery.   Discussed in detail all treatment options and recommendations with the patient and they are agreeable to the plan.    Thank you for consulting with [facility] and for the opportunity to participate in this patient's treatment.  Should you have any questions or concerns, please contact the [facility] at [phone].   04/17/2022   HISTORY OF PRESENT ILLNESS: Lisa Ayala is a very pleasant 56 y.o. female with history of PSVT, severe mitral regurgitation, moderate tricuspid regurgitation, mild-moderate aortic insufficiency, long-term (current use) of anticoagulation (on Eliquis), anxiety disorder, regular marijuana use and cocaine use who was recently diagnosed with severe mitral tricuspid and aortic valve disease and is anticipating replacements of all the valves along with a Maze procedure and atrial appendage clipping of the left atrium at  [facility] on 05/04/22.  She was admitted at [facility] for an extended period of time in May for cardiogenic shock and rehabilitation. The patient presents today for a medically necessary dental consultation as part of their pre-cardiac surgery work-up.  She was referred by Dr. Delford Field, MD who saw the patient on 04/11/22 for an initial visit to establish care.  He noted very poor dentition and concern for postoperative complications if she does have her heart surgery without having this addressed first.  He discussed the patient's case with Dr. Gala Romney who last saw the patient on 7/14, and Dr. Gala Romney agreed with his concerns and recommended referral to our outpatient dental clinic to address dental issues prior to cardiac surgery on 8/10.   DENTAL HISTORY: The patient reports that she has never been to the dentist.  She says that her teeth hurt sometimes on and off.  She says that she recently lost her lower front teeth during her most recent hospital admission (they fell out on their own).  She says that several of her teeth have fallen out naturally.  She currently denies any dental/orofacial pain or sensitivity. Patient is able to manage oral secretions.  Patient denies dysphagia, odynophagia, dysphonia, and neck pain.  Patient denies fever, rigors and malaise.   CHIEF COMPLAINT:  Here for a preoperative dental exam.  Patient Active Problem List   Diagnosis Date Noted   Chronic diastolic heart failure (HCC) 03/01/2022   Rheumatic mitral regurgitation    Severe mitral regurgitation    Past Medical History:  Diagnosis Date   Aortic insufficiency    Atrial flutter (HCC) 02/07/2022   PSVT (paroxysmal supraventricular tachycardia) (HCC)    Severe mitral  regurgitation    a. 08/2021 Echo: EF 70-75%, no rwma, mod dil LV, GrII DD, nl RV fxn, massively dil LA, mild-mod dil RA, severe MR due to lack of central coaptation of MV leaflets. Mild MS. Mod TR. Mild-mod AI.   Tricuspid  regurgitation    Past Surgical History:  Procedure Laterality Date   RIGHT/LEFT HEART CATH AND CORONARY ANGIOGRAPHY N/A 02/10/2022   Procedure: RIGHT/LEFT HEART CATH AND CORONARY ANGIOGRAPHY;  Surgeon: Dolores Patty, MD;  Location: [facility] INVASIVE CV LAB;  Service: Cardiovascular;  Laterality: N/A;   TEE WITHOUT CARDIOVERSION N/A 02/08/2022   Procedure: TRANSESOPHAGEAL ECHOCARDIOGRAM (TEE);  Surgeon: Sande Rives, MD;  Location: [facility] ENDOSCOPY;  Service: Cardiovascular;  Laterality: N/A;   No Known Allergies Current Outpatient Medications  Medication Sig Dispense Refill   amiodarone (PACERONE) 200 MG tablet Take 1 tablet (200 mg total) by mouth 2 (two) times daily. 180 tablet 3   apixaban (ELIQUIS) 5 MG TABS tablet Take 1 tablet (5 mg total) by mouth 2 (two) times daily. 60 tablet 1   dapagliflozin propanediol (FARXIGA) 10 MG TABS tablet Take 1 tablet (10 mg total) by mouth daily. 90 tablet 3   magnesium oxide (MAG-OX) 400 MG tablet Take 1 tablet (400 mg total) by mouth 2 (two) times daily. 60 tablet 0   metoprolol succinate (TOPROL XL) 25 MG 24 hr tablet Take 0.5 tablets (12.5 mg total) by mouth daily. 45 tablet 3   Multiple Vitamin (MULTIVITAMIN ADULT PO) Take 1 tablet by mouth daily.     pantoprazole (PROTONIX) 40 MG tablet Take 1 tablet (40 mg total) by mouth at bedtime. 30 tablet 0   polyethylene glycol powder (GLYCOLAX/MIRALAX) 17 GM/SCOOP powder Take 17 g by mouth daily dissolved in water. 238 g 0   QUEtiapine (SEROQUEL) 25 MG tablet Take 1 tablet (25 mg total) by mouth at bedtime as needed (sleep). 30 tablet 0   sacubitril-valsartan (ENTRESTO) 24-26 MG Take 1 tablet by mouth 2 (two) times daily. 180 tablet 3   spironolactone (ALDACTONE) 25 MG tablet Take 1/2 tablet (12.5 mg total) by mouth daily. 45 tablet 3   No current facility-administered medications for this visit.    LABS: Lab Results  Component Value Date   WBC 7.4 03/06/2022   HGB 11.6 (L) 03/06/2022   HCT 38.0  03/06/2022   MCV 90.7 03/06/2022   PLT 432 (H) 03/06/2022      Component Value Date/Time   NA 138 03/06/2022 1616   NA 140 09/02/2021 0908   K 4.8 03/06/2022 1616   CL 108 03/06/2022 1616   CO2 24 03/06/2022 1616   GLUCOSE 142 (H) 03/06/2022 1616   BUN 23 (H) 03/06/2022 1616   BUN 19 09/02/2021 0908   CREATININE 1.12 (H) 03/06/2022 1616   CALCIUM 9.7 03/06/2022 1616   GFRNONAA 57 (L) 03/06/2022 1616   Lab Results  Component Value Date   INR 1.3 (H) 02/07/2022   INR 1.7 (H) 07/02/2021   No results found for: "PTT"  Social History   Socioeconomic History   Marital status: Divorced    Spouse name: Not on file   Number of children: Not on file   Years of education: Not on file   Highest education level: Not on file  Occupational History   Not on file  Tobacco Use   Smoking status: Never   Smokeless tobacco: Never  Vaping Use   Vaping Use: Never used  Substance and Sexual Activity   Alcohol use: Not  Currently   Drug use: Not Currently    Types: Marijuana, Cocaine   Sexual activity: Not Currently  Other Topics Concern   Not on file  Social History Narrative   Lives locally.  Relatively active.   Social Determinants of Health   Financial Resource Strain: High Risk (03/20/2022)   Overall Financial Resource Strain (CARDIA)    Difficulty of Paying Living Expenses: Very hard  Food Insecurity: Food Insecurity Present (03/06/2022)   Hunger Vital Sign    Worried About Running Out of Food in the Last Year: Sometimes true    Ran Out of Food in the Last Year: Sometimes true  Transportation Needs: Unmet Transportation Needs (03/20/2022)   PRAPARE - Administrator, Civil Service (Medical): Yes    Lack of Transportation (Non-Medical): Yes  Physical Activity: Not on file  Stress: Not on file  Social Connections: Not on file  Intimate Partner Violence: Not on file   Family History  Family history unknown: Yes    REVIEW OF SYSTEMS:  Reviewed with the patient  as per HPI. PSYCH:  [+] Dental phobia   VITAL SIGNS: BP (!) 145/94 (BP Location: Right Arm, Patient Position: Sitting, Cuff Size: Normal)   Pulse 72   Temp 98.1 F (36.7 C) (Oral)    PHYSICAL EXAM: Soft tissue exam completed and charted. GENERAL:  Well-developed, comfortable and in no apparent distress. NEUROLOGICAL:  Alert and oriented to person, place and  time. EXTRAORAL:  Facial symmetry present without any edema or erythema.  No swelling or lymphadenopathy.  TMJ asymptomatic without clicks or crepitations.  INTRAORAL:  Soft tissues appear well-perfused and mucous membranes moist.  FOM and vestibules soft and not raised. Oral cavity without mass or lesion. No signs of infection, parulis, sinus tract, edema or erythema evident upon exam. [+] Palatal tori   DENTAL EXAM: Hard tissue exam completed and charted.    OVERALL IMPRESSION:  Poor remaining dentition.       ORAL HYGIENE:  Poor     PERIODONTAL:  Inflamed, erythematous gingival tissue.   Generalized heavy calculus accumulation. [+] Mobility:  Class III- #21, #26, #27;  Class II- #5, #6, #10;  Class I- #11, #12 CARIES:  #5, #6, #10, #11, #12, #13, #19, #21, #26, #27, #28 RETAINED ROOT TIPS:  #13, #19, #28 PROSTHODONTICS:  Patient denies history of wearing partial dentures. OCCLUSION:  Class 3 shift/tendency (mandible slides forward upon closing) Non-functional teeth:  Remaining dentition is non-functional   RADIOGRAPHIC EXAM:  PAN and Full Mouth Series were exposed and interpreted.  Condyles seated bilaterally in fossas.  No evidence of abnormal pathology.  All visualized osseous structures appear WNL.  Anterior mandible left of midline has large region of bone loss; there is a small radiopaque area distal to this region that may be loose bone vs tooth root vs calculus (could be any of these considering pt's severe periodontal disease and hx of teeth falling out on their own). Missing teeth #'s 1-4, 7, 8, 9, 14, 15, 16,  17, 18, 20, 22-25 & 29-32.  Remaining teeth appear supra-erupted. #21 drifting distal.     Generalized severe horizontal bone loss consistent with severe periodontitis.  Radiographic calculus evident. Retained root tips #13, #19 & #28.  Periapical radiolucencies evident on teeth #'s 13, 19, 21, 26, 27 & 28.  Caries-  #58M&D, #89M, #45M&D, #34M&D, #98M&D, #21D, #26D & #62M.   ASSESSMENT:  1.  Severe mitral regurgitation, chronic diastolic heart failure 2.  Preoperative dental  exam 3.  Long-term (current) use of anticoagulation 4.  Missing teeth 5.  Caries 6.  Retained root tips 7.  Chronic apical periodontitis 8.  Loose teeth 9.  Accretions on teeth 10.  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  Malocclusion   PLAN AND RECOMMENDATIONS: I discussed the risks, benefits, and complications of various scenarios with the patient in relationship to their medical and dental conditions, which included systemic infection such as endocarditis, bacteremia or other serious issues that could potentially occur either before, during or after their anticipated heart surgery if dental/oral concerns are not addressed.  I explained that if any chronic or acute dental/oral infection(s) are addressed and subsequently not maintained following medical optimization and recovery, their risk of the previously mentioned complications are just as high and could potentially occur postoperatively.  I explained all significant findings of the dental consultation with the patient including severe periodontal disease causing loose teeth and chronic inflammation and infection of her gums, retained tooth roots and several teeth with cavities, and the recommended care including extraction off all remaining teeth due to the severity of gum disease and chronic infection in order to optimize the patient for heart surgery from a dental standpoint.  The patient verbalized  understanding of all findings, discussion, and recommendations. We then discussed various treatment options to include no treatment, multiple extractions with alveoloplasty, pre-prosthetic surgery as indicated, periodontal therapy, dental restorations, root canal therapy, crown and bridge therapy, implant therapy, and replacement of missing teeth as indicated.  Also discussed the option of referral to oral surgeon for extractions under IV sedation or general anesthesia as indicated or having dental procedure at [facility] in the operating room under general anesthesia due to her postoperative bleeding risk, extensive medical history, urgency to complete dental extractions/treatment prior to her scheduled heart surgery and severe dental phobia.  The patient verbalized understanding of all options, and currently wishes to proceed with full mouth extractions as recommended in the O.R. under general anesthesia.  We discussed potential surgery date being next Thursday (8/3).  I explained that I will need to make sure she is a candidate for general anesthesia and that there are no contraindications to this.  I will also make sure it is okay for her to hold her Eliquis 24 hours prior to surgery and resume 24 hours after surgery.  The patient verbalized understanding and is agreeable to this plan.  Plan to discuss all findings and recommendations with medical team and coordinate future care as needed.   The patient will need to establish care at a dental office of her choice for routine dental care including replacement of missing teeth as needed.  Recommend discussing plans to return to the dentist for routine dental care with her medical team following her surgery and medical optimization for new antibiotic prophylaxis recommendations.   All questions and concerns were invited and addressed.  The patient tolerated today's visit well and departed in stable condition. I spent in excess of 120 minutes during the  conduct of this consultation and >50% of this time involved direct face-to-face encounter for counseling and/or coordination of the patient's care.  - Jessilyn Catino B. Chales Salmon, DMD

## 2022-04-19 ENCOUNTER — Other Ambulatory Visit (HOSPITAL_COMMUNITY): Payer: Self-pay

## 2022-04-21 ENCOUNTER — Telehealth (HOSPITAL_COMMUNITY): Payer: Self-pay | Admitting: Internal Medicine

## 2022-04-21 NOTE — Telephone Encounter (Signed)
Pt request triage to call back regarding letter  from [employer] to support why daughter is not able to go back to school, need to break lease, she is the primary caregiver,  pt can be reached @[phone]  please advise

## 2022-04-25 NOTE — Telephone Encounter (Signed)
Pts daughter aware that Dr.Bensimhon is on vacation and that we will follow up next week. She also gave apartment leasing office phone number [phone] Lisa Ayala)

## 2022-04-26 ENCOUNTER — Other Ambulatory Visit: Payer: Self-pay

## 2022-04-26 ENCOUNTER — Encounter (HOSPITAL_COMMUNITY): Payer: Self-pay | Admitting: Dentistry

## 2022-04-26 NOTE — Progress Notes (Signed)
Anesthesia Chart Review: Lisa Ayala  Case: [id number] Date/Time: 04/27/22 0915   Procedure: DENTAL RESTORATION/EXTRACTIONS   Anesthesia type: General   Pre-op diagnosis: DENTAL CARIES   Location: [facility] OR ROOM 08 / [facility] OR   Surgeons: Sharman Cheek, DMD       DISCUSSION: Patient is a 56 year old female scheduled for the above procedure. She is being considered for future MVR, possible AVR/TVR on 05/04/22 at [facility]. PCP evaluation by PCP Storm Frisk, MD on 04/11/22.   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 + UDS for cocaine/THC 02/07/22.       [facility] admission 02/07/22-02/11/22 for rapid atrial tachycardia (SVT s/p adenosine-->aflutter). TEE/DCCV planned on 02/08/22 but she developed flash pulmonary edema/cardiogenic shock, brief PEA arrest (s/p CPR, epi, bicarb, ROSC within 2 min). Intubated and placed on milrinone, nonepi, and furosemide drip. UDS + cocaine and THC. Also treated for Group C Strep PNA. 02/10/22 TEE showed severe MR and RHC/LHC showed non-obstructive CAD, RA 9, PA 50/10, mean PCWP 12, CI 6.8. Post cath she developed rapid SVT, underwent emergent DCCV. Developed recurrent afib 02/11/22. Extubated 02/14/22, off milrinone. Afib managed with diltiazem, amiodarone and Eliquis. CT surgery consulted with plan for MVR and MAZE once recovered from acute illness. Discharged to CIR with stay until 02/24/22.   Last visit with HF cardiologist Dr. Gala Romney was on 04/07/22. He notes she is scheduled for triple valve surgery (aortic valve replacement/mitral valve replacement/possible tricuspid valve repair or replacement/MAZE and placement left atrial appendage clip) on 05/04/22 by Dr. Zebedee Iba.   Dr. Chales Salmon advised to hold Eliquis 24 hours prior to surgery.   She is a same day work-up, so labs and anesthesia team  evaluation on the day of surgery.    VS:  BP Readings from Last 3 Encounters:  04/17/22 (!) 145/94  04/11/22 (!) 155/102  04/07/22 112/70   Pulse Readings from Last 3 Encounters:  04/17/22 72  04/11/22 71  04/07/22 71     PROVIDERS: Storm Frisk, MD is PCP [facility]) Gala Romney, Reuel Boom, MD is HF cardiologist Loman Brooklyn, MD is EP Clent Jacks, MD is CT surgeon Kateri Mc)   LABS: For day of surgery. Last results include: Lab Results  Component Value Date   WBC 7.4 03/06/2022   HGB 11.6 (L) 03/06/2022   HCT 38.0 03/06/2022   PLT 432 (H) 03/06/2022   GLUCOSE 142 (H) 03/06/2022   TRIG 64 02/20/2022   ALT 33 02/22/2022   AST 21 02/22/2022   NA 138 03/06/2022   K 4.8 03/06/2022   CL 108 03/06/2022   CREATININE 1.12 (H) 03/06/2022   BUN 23 (H) 03/06/2022   CO2 24 03/06/2022   TSH 1.569 02/07/2022   INR 1.3 (H) 02/07/2022   HGBA1C 5.3 02/07/2022    IMAGES: [place] Abd 02/17/22: IMPRESSION: 1. No acute findings. No evidence for cholecystitis. 2. Common bile duct is upper limits of normal measuring 6 mm.   CT Head 02/15/22: IMPRESSION: 1. No acute intracranial abnormalities. 2. Focal area of low density within the left cerebellar hemisphere is suspicious for subacute to chronic infarct.   1V PCXR 02/13/22: IMPRESSION: Stable support apparatus. Stable bibasilar atelectasis or edema is noted with associated pleural effusions.   EKG: 03/06/22: Normal sinus rhythm Biatrial enlargement Right bundle branch block Abnormal ECG When compared with ECG  of 24-Feb-2022 08:45, No significant change was found Confirmed by Yates Decamp ([id number]) on 03/06/2022 6:08:28 PM   CV: US Carotid 02/15/22: Summary:  - Right Carotid: Velocities in the right ICA are consistent with a 1-39%  stenosis. Limited visualization secondary to bandaging.  - Left Carotid: Velocities in the left ICA are consistent with a 1-39%  stenosis.  - Vertebrals:   Bilateral vertebral arteries demonstrate antegrade flow.  - Subclavians: Normal flow hemodynamics were seen in bilateral subclavian arteries.    RHC/LHC 02/10/22:   Ost RCA to Prox RCA lesion is 20% stenosed.   Prox RCA to Mid RCA lesion is 40% stenosed.   Mid LAD lesion is 40% stenosed.   The left ventricular ejection fraction is 55-65% by visual estimate.   Findings: Ao = 139/74 LV = 136/12 RA = 9 RV = 46/10 PA = 50/10 (26) PCW = 12 (no significant v waves) Fick cardiac output/index = 12.2/6.8 PVR = 1.8 WU Ao sat = 99% PA sat = 79%, 80% SVC sat = 85%   Assessment: 1. Mild non-obstructive CAD 2. EF 60-65% 3. Severe MR by echo. No significant v-waves in PCWP tracing 4. High cardiac output - may be overestimated by Fick   Plan/Discussion:  Will plan to wean vent. TCTS consulted for MVR.    TEE 02/09/22: IMPRESSIONS   1. Left ventricular ejection fraction, by estimation, is 60 to 65%. The  left ventricle has normal function. The left ventricle has no regional  wall motion abnormalities.   2. Right ventricular systolic function is mildly reduced. The right  ventricular size is normal.   3. Left atrial size was severely dilated. No left atrial/left atrial  appendage thrombus was detected.   4. A small pericardial effusion is present. The pericardial effusion is  localized near the right atrium and anterior to the right ventricle.   5. 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   6. The aortic valve is tricuspid. Aortic valve regurgitation is mild.   7. There is mild (Grade II) plaque involving the descending aorta.    TTE 02/08/22: IMPRESSIONS   1. Left ventricular ejection fraction, by estimation, is 65 to 70%. The  left ventricle has normal function. The left ventricle has no regional  wall motion abnormalities. There is moderate concentric left ventricular  hypertrophy. Left ventricular  diastolic parameters are consistent with Grade III  diastolic dysfunction  (restrictive).   2. Right ventricular systolic function is mildly reduced. The right  ventricular size is normal. There is mildly elevated pulmonary artery  systolic pressure.   3. Left atrial size was severely dilated.   4. Right atrial size was mildly dilated.   5. A small pericardial effusion is present.   6. 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  Trivial mitral stenosis.   7. Tricuspid valve regurgitation is moderate.   8. The aortic valve is tricuspid. There is mild calcification of the  aortic valve. Aortic valve regurgitation is mild.   9. The inferior vena cava is dilated in size with <50% respiratory  variability, suggesting right atrial pressure of 15 mmHg.  - Comparison(s): Prior images reviewed side by side.  - Conclusion(s)/Recommendation(s): No significant changes from  prior--discussed with Dr. Bjorn Pippin. Severe MR. Valves better seen on prior  imaging.   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 08/2021 Echo: EF 70-75%, no rwma, mod dil LV, GrII DD, nl RV fxn, massively dil LA, mild-mod dil RA, severe MR due to lack of central coaptation of MV leaflets. Mild MS. Mod TR. Mild-mod AI.   Tricuspid regurgitation     Past Surgical History:  Procedure Laterality Date   RIGHT/LEFT HEART CATH AND CORONARY ANGIOGRAPHY N/A 02/10/2022   Procedure: RIGHT/LEFT HEART CATH AND CORONARY ANGIOGRAPHY;  Surgeon: Jolaine Artist, MD;  Location: [facility];  Service: Cardiovascular;  Laterality: N/A;   TEE WITHOUT CARDIOVERSION N/A 02/08/2022   Procedure: TRANSESOPHAGEAL ECHOCARDIOGRAM (TEE);  Surgeon: Geralynn Rile, MD;  Location: [facility];  Service: Cardiovascular;  Laterality: N/A;    MEDICATIONS: No current  facility-administered medications for this encounter.    amiodarone (PACERONE) 200 MG tablet   apixaban (ELIQUIS) 5 MG TABS tablet   dapagliflozin propanediol (FARXIGA) 10 MG TABS tablet   magnesium oxide (MAG-OX) 400 MG tablet   metoprolol succinate (TOPROL XL) 25 MG 24 hr tablet   Multiple Vitamin (MULTIVITAMIN ADULT PO)   pantoprazole (PROTONIX) 40 MG tablet   polyethylene glycol powder (GLYCOLAX/MIRALAX) 17 GM/SCOOP powder   QUEtiapine (SEROQUEL) 25 MG tablet   sacubitril-valsartan (ENTRESTO) 24-26 MG   spironolactone (ALDACTONE) 25 MG tablet    Myra Gianotti, PA-C Surgical Short Stay/Anesthesiology [facility] Phone [phone] [facility] Phone [phone] 04/26/2022 1:58 PM

## 2022-04-26 NOTE — Progress Notes (Signed)
PCP - Dr. Kennyth Arnold  Cardiologist - Dr. Gala Romney  EP- Denies  Endocrine- Denies  Pulm- Denies  Chest x-ray - 02/13/22 (E)  EKG - 03/06/22 (E)  Stress Test - Denies  ECHO - 02/08/22 (E)  Cardiac Cath - 02/10/22 (E)  AICD-na PM-na LOOP-na  Nerve Stimulator- Denies  Dialysis- Denies  Sleep Study - Denies CPAP - Denies  LABS- CBC, CMP, UPT  ASA- Denies ELIQUIS- LD- 8/2 in am- see progress not regarding instructions  ERAS- No  HA1C- 02/07/22(E): 5.3- Pt states she is not a diabetic, she takes [place] for her heart.  Anesthesia- Yes- Cardiac history- anesthesia PAs notified  Pt denies having chest pain, sob, or fever during the pre-op phone call. All instructions explained to the pt, with a verbal understanding of the material including: as of today, stop taking all Aspirin (unless instructed by your doctor) and Other Aspirin containing products, Vitamins, Fish oils, and Herbal medications. Also stop all NSAIDS i.e. Advil, Ibuprofen, Motrin, Aleve, Anaprox, Naproxen, BC, Goody Powders, and all Supplements. Pt also instructed to wear a mask and social distance if she goes out. The opportunity to ask questions was provided.

## 2022-04-26 NOTE — Anesthesia Preprocedure Evaluation (Signed)
Anesthesia Evaluation  Patient identified by MRN, date of birth, ID band Patient awake    Reviewed: Allergy & Precautions, H&P , NPO status , Patient's Chart, lab work & pertinent test results, reviewed documented beta blocker date and time   Airway Mallampati: II  TM Distance: >3 FB Neck ROM: Full    Dental no notable dental hx. (+) Poor Dentition, Dental Advisory Given   Pulmonary neg pulmonary ROS, Patient abstained from smoking.,    Pulmonary exam normal breath sounds clear to auscultation       Cardiovascular hypertension, Pt. on medications and Pt. on home beta blockers + dysrhythmias Atrial Fibrillation + Valvular Problems/Murmurs MR and AI  Rhythm:Regular Rate:Normal     Neuro/Psych Anxiety negative neurological ROS     GI/Hepatic Neg liver ROS, GERD  Medicated,  Endo/Other  negative endocrine ROS  Renal/GU negative Renal ROS  negative genitourinary   Musculoskeletal   Abdominal   Peds  Hematology negative hematology ROS (+)   Anesthesia Other Findings   Reproductive/Obstetrics negative OB ROS                           Anesthesia Physical Anesthesia Plan  ASA: 3  Anesthesia Plan: General   Post-op Pain Management: Tylenol PO (pre-op)*   Induction: Intravenous  PONV Risk Score and Plan: 4 or greater and Ondansetron, Dexamethasone and Midazolam  Airway Management Planned: Nasal ETT and Video Laryngoscope Planned  Additional Equipment:   Intra-op Plan:   Post-operative Plan: Extubation in OR  Informed Consent: I have reviewed the patients History and Physical, chart, labs and discussed the procedure including the risks, benefits and alternatives for the proposed anesthesia with the patient or authorized representative who has indicated his/her understanding and acceptance.     Dental advisory given  Plan Discussed with: CRNA  Anesthesia Plan Comments: (PAT note  written 04/26/2022 by Shonna Chock, PA-C. )       Anesthesia Quick Evaluation

## 2022-04-26 NOTE — Progress Notes (Signed)
Dr. Chales Salmon made aware that the pt took a dose of Eliquis this am 04/26/22, and would like the pt to hold until after surgery. Will relay to the pt.

## 2022-04-27 ENCOUNTER — Other Ambulatory Visit: Payer: Self-pay

## 2022-04-27 ENCOUNTER — Ambulatory Visit (HOSPITAL_COMMUNITY)
Admission: RE | Admit: 2022-04-27 | Discharge: 2022-04-27 | Disposition: A | Discharge status: Home / Self Care | Payer: Medicaid Other | Attending: Dentistry | Admitting: Dentistry

## 2022-04-27 ENCOUNTER — Ambulatory Visit (HOSPITAL_COMMUNITY): Payer: Medicaid Other | Admitting: Vascular Surgery

## 2022-04-27 ENCOUNTER — Ambulatory Visit (HOSPITAL_BASED_OUTPATIENT_CLINIC_OR_DEPARTMENT_OTHER): Payer: Medicaid Other | Admitting: Vascular Surgery

## 2022-04-27 ENCOUNTER — Encounter (HOSPITAL_COMMUNITY)
Admission: RE | Disposition: A | Discharge status: Home / Self Care | Payer: Self-pay | Source: Home / Self Care | Attending: Dentistry

## 2022-04-27 ENCOUNTER — Encounter (HOSPITAL_COMMUNITY): Payer: Self-pay | Admitting: Dentistry

## 2022-04-27 DIAGNOSIS — K083 Retained dental root: Secondary | ICD-10-CM

## 2022-04-27 DIAGNOSIS — N179 Acute kidney failure, unspecified: Secondary | ICD-10-CM | POA: Diagnosis not present

## 2022-04-27 DIAGNOSIS — K045 Chronic apical periodontitis: Secondary | ICD-10-CM

## 2022-04-27 DIAGNOSIS — F1290 Cannabis use, unspecified, uncomplicated: Secondary | ICD-10-CM | POA: Diagnosis not present

## 2022-04-27 DIAGNOSIS — F1490 Cocaine use, unspecified, uncomplicated: Secondary | ICD-10-CM | POA: Diagnosis not present

## 2022-04-27 DIAGNOSIS — K029 Dental caries, unspecified: Secondary | ICD-10-CM | POA: Diagnosis present

## 2022-04-27 DIAGNOSIS — I4892 Unspecified atrial flutter: Secondary | ICD-10-CM | POA: Diagnosis not present

## 2022-04-27 DIAGNOSIS — K056 Periodontal disease, unspecified: Secondary | ICD-10-CM | POA: Insufficient documentation

## 2022-04-27 DIAGNOSIS — K219 Gastro-esophageal reflux disease without esophagitis: Secondary | ICD-10-CM | POA: Diagnosis not present

## 2022-04-27 DIAGNOSIS — I110 Hypertensive heart disease with heart failure: Secondary | ICD-10-CM | POA: Diagnosis not present

## 2022-04-27 DIAGNOSIS — I10 Essential (primary) hypertension: Secondary | ICD-10-CM

## 2022-04-27 DIAGNOSIS — Z79899 Other long term (current) drug therapy: Secondary | ICD-10-CM | POA: Diagnosis not present

## 2022-04-27 DIAGNOSIS — F419 Anxiety disorder, unspecified: Secondary | ICD-10-CM | POA: Insufficient documentation

## 2022-04-27 DIAGNOSIS — I083 Combined rheumatic disorders of mitral, aortic and tricuspid valves: Secondary | ICD-10-CM | POA: Diagnosis not present

## 2022-04-27 DIAGNOSIS — K0889 Other specified disorders of teeth and supporting structures: Secondary | ICD-10-CM

## 2022-04-27 DIAGNOSIS — I2510 Atherosclerotic heart disease of native coronary artery without angina pectoris: Secondary | ICD-10-CM | POA: Insufficient documentation

## 2022-04-27 DIAGNOSIS — I471 Supraventricular tachycardia: Secondary | ICD-10-CM | POA: Insufficient documentation

## 2022-04-27 DIAGNOSIS — I5033 Acute on chronic diastolic (congestive) heart failure: Secondary | ICD-10-CM | POA: Diagnosis not present

## 2022-04-27 DIAGNOSIS — Z7901 Long term (current) use of anticoagulants: Secondary | ICD-10-CM | POA: Insufficient documentation

## 2022-04-27 DIAGNOSIS — I4891 Unspecified atrial fibrillation: Secondary | ICD-10-CM | POA: Diagnosis not present

## 2022-04-27 HISTORY — DX: Gastro-esophageal reflux disease without esophagitis: K21.9

## 2022-04-27 HISTORY — DX: Essential (primary) hypertension: I10

## 2022-04-27 HISTORY — PX: TOOTH EXTRACTION: SHX859

## 2022-04-27 LAB — CBC
HCT: 46.5 % — ABNORMAL HIGH (ref 36.0–46.0)
Hemoglobin: 15 g/dL (ref 12.0–15.0)
MCH: 28.2 pg (ref 26.0–34.0)
MCHC: 32.3 g/dL (ref 30.0–36.0)
MCV: 87.4 fL (ref 80.0–100.0)
Platelets: 323 10*3/uL (ref 150–400)
RBC: 5.32 MIL/uL — ABNORMAL HIGH (ref 3.87–5.11)
RDW: 15.9 % — ABNORMAL HIGH (ref 11.5–15.5)
WBC: 9.9 10*3/uL (ref 4.0–10.5)
nRBC: 0 % (ref 0.0–0.2)

## 2022-04-27 LAB — COMPREHENSIVE METABOLIC PANEL
ALT: 14 U/L (ref 0–44)
AST: 19 U/L (ref 15–41)
Albumin: 4 g/dL (ref 3.5–5.0)
Alkaline Phosphatase: 97 U/L (ref 38–126)
Anion gap: 14 (ref 5–15)
BUN: 22 mg/dL — ABNORMAL HIGH (ref 6–20)
CO2: 23 mmol/L (ref 22–32)
Calcium: 10.5 mg/dL — ABNORMAL HIGH (ref 8.9–10.3)
Chloride: 105 mmol/L (ref 98–111)
Creatinine, Ser: 1.59 mg/dL — ABNORMAL HIGH (ref 0.44–1.00)
GFR, Estimated: 38 mL/min — ABNORMAL LOW (ref >60)
Glucose, Bld: 116 mg/dL — ABNORMAL HIGH (ref 70–99)
Potassium: 4.5 mmol/L (ref 3.5–5.1)
Sodium: 142 mmol/L (ref 135–145)
Total Bilirubin: 0.9 mg/dL (ref 0.3–1.2)
Total Protein: 7.9 g/dL (ref 6.5–8.1)

## 2022-04-27 LAB — POCT PREGNANCY, URINE: Preg Test, Ur: NEGATIVE

## 2022-04-27 SURGERY — DENTAL RESTORATION/EXTRACTIONS
Anesthesia: General | Site: Mouth

## 2022-04-27 MED ORDER — PROPOFOL 10 MG/ML IV BOLUS
INTRAVENOUS | Status: AC
  Filled 2022-04-27: qty 20

## 2022-04-27 MED ORDER — CEFAZOLIN SODIUM-DEXTROSE 2-4 GM/100ML-% IV SOLN
2.0000 g | INTRAVENOUS | Status: AC
  Administered 2022-04-27: 2 g via INTRAVENOUS
  Filled 2022-04-27: qty 100

## 2022-04-27 MED ORDER — ORAL CARE MOUTH RINSE: 15.0000 mL | Freq: Once | OROMUCOSAL | Status: AC

## 2022-04-27 MED ORDER — DEXAMETHASONE SODIUM PHOSPHATE 10 MG/ML IJ SOLN
INTRAMUSCULAR | Status: DC | PRN
  Administered 2022-04-27: 10 mg via INTRAVENOUS

## 2022-04-27 MED ORDER — CHLORHEXIDINE GLUCONATE 0.12 % MT SOLN
15.0000 mL | Freq: Once | OROMUCOSAL | Status: AC
  Administered 2022-04-27: 15 mL via OROMUCOSAL
  Filled 2022-04-27: qty 15

## 2022-04-27 MED ORDER — LIDOCAINE 2% (20 MG/ML) 5 ML SYRINGE
INTRAMUSCULAR | Status: AC
  Filled 2022-04-27: qty 5

## 2022-04-27 MED ORDER — ONDANSETRON HCL 4 MG/2ML IJ SOLN
INTRAMUSCULAR | Status: DC | PRN
  Administered 2022-04-27: 4 mg via INTRAVENOUS

## 2022-04-27 MED ORDER — LIDOCAINE-EPINEPHRINE 2 %-1:100000 IJ SOLN
INTRAMUSCULAR | Status: DC | PRN
  Administered 2022-04-27 (×2): 1.7 mL

## 2022-04-27 MED ORDER — LIDOCAINE-EPINEPHRINE 2 %-1:100000 IJ SOLN
INTRAMUSCULAR | Status: AC
  Filled 2022-04-27: qty 10.2

## 2022-04-27 MED ORDER — ROCURONIUM BROMIDE 10 MG/ML (PF) SYRINGE
PREFILLED_SYRINGE | INTRAVENOUS | Status: DC | PRN
  Administered 2022-04-27: 50 mg via INTRAVENOUS

## 2022-04-27 MED ORDER — PROPOFOL 10 MG/ML IV BOLUS
INTRAVENOUS | Status: DC | PRN
  Administered 2022-04-27: 200 mg via INTRAVENOUS

## 2022-04-27 MED ORDER — ACETAMINOPHEN 500 MG PO TABS
1000.0000 mg | ORAL_TABLET | Freq: Once | ORAL | Status: AC
Start: 2022-04-27 — End: 2022-04-27
  Administered 2022-04-27: 1000 mg via ORAL

## 2022-04-27 MED ORDER — HYDROMORPHONE HCL 1 MG/ML IJ SOLN
INTRAMUSCULAR | Status: AC
  Filled 2022-04-27: qty 1

## 2022-04-27 MED ORDER — MIDAZOLAM HCL 5 MG/5ML IJ SOLN
INTRAMUSCULAR | Status: DC | PRN
  Administered 2022-04-27: 2 mg via INTRAVENOUS

## 2022-04-27 MED ORDER — FENTANYL CITRATE (PF) 250 MCG/5ML IJ SOLN
INTRAMUSCULAR | Status: AC
  Filled 2022-04-27: qty 5

## 2022-04-27 MED ORDER — MIDAZOLAM HCL 2 MG/2ML IJ SOLN
INTRAMUSCULAR | Status: AC
  Filled 2022-04-27: qty 2

## 2022-04-27 MED ORDER — HYDRALAZINE HCL 20 MG/ML IJ SOLN
INTRAMUSCULAR | Status: AC
  Administered 2022-04-27: 5 mg via INTRAVENOUS
  Filled 2022-04-27: qty 1

## 2022-04-27 MED ORDER — METOPROLOL SUCCINATE ER 25 MG PO TB24
ORAL_TABLET | ORAL | Status: AC
  Filled 2022-04-27: qty 1

## 2022-04-27 MED ORDER — LABETALOL HCL 5 MG/ML IV SOLN
INTRAVENOUS | Status: DC | PRN
  Administered 2022-04-27: 10 mg via INTRAVENOUS

## 2022-04-27 MED ORDER — FENTANYL CITRATE (PF) 250 MCG/5ML IJ SOLN
INTRAMUSCULAR | Status: DC | PRN
  Administered 2022-04-27: 100 ug via INTRAVENOUS
  Administered 2022-04-27: 50 ug via INTRAVENOUS

## 2022-04-27 MED ORDER — LIDOCAINE 2% (20 MG/ML) 5 ML SYRINGE
INTRAMUSCULAR | Status: DC | PRN
  Administered 2022-04-27: 60 mg via INTRAVENOUS

## 2022-04-27 MED ORDER — BUPIVACAINE-EPINEPHRINE (PF) 0.5% -1:200000 IJ SOLN
INTRAMUSCULAR | Status: AC
  Filled 2022-04-27: qty 3.6

## 2022-04-27 MED ORDER — SUGAMMADEX SODIUM 200 MG/2ML IV SOLN
INTRAVENOUS | Status: DC | PRN
  Administered 2022-04-27: 200 mg via INTRAVENOUS

## 2022-04-27 MED ORDER — METOPROLOL SUCCINATE ER 25 MG PO TB24
12.5000 mg | ORAL_TABLET | Freq: Every day | ORAL | Status: DC
  Administered 2022-04-27: 12.5 mg via ORAL

## 2022-04-27 MED ORDER — DEXAMETHASONE SODIUM PHOSPHATE 10 MG/ML IJ SOLN
INTRAMUSCULAR | Status: AC
  Filled 2022-04-27: qty 1

## 2022-04-27 MED ORDER — ACETAMINOPHEN 500 MG PO TABS
ORAL_TABLET | ORAL | Status: AC
  Filled 2022-04-27: qty 2

## 2022-04-27 MED ORDER — PHENYLEPHRINE 80 MCG/ML (10ML) SYRINGE FOR IV PUSH (FOR BLOOD PRESSURE SUPPORT): PREFILLED_SYRINGE | INTRAVENOUS | Status: DC | PRN

## 2022-04-27 MED ORDER — PHENYLEPHRINE HCL-NACL 20-0.9 MG/250ML-% IV SOLN
INTRAVENOUS | Status: DC | PRN
  Administered 2022-04-27: 40 ug/min via INTRAVENOUS

## 2022-04-27 MED ORDER — ONDANSETRON HCL 4 MG/2ML IJ SOLN
INTRAMUSCULAR | Status: AC
  Filled 2022-04-27: qty 2

## 2022-04-27 MED ORDER — HYDROMORPHONE HCL 1 MG/ML IJ SOLN
0.2500 mg | INTRAMUSCULAR | Status: DC | PRN
  Administered 2022-04-27 (×2): 0.25 mg via INTRAVENOUS
  Administered 2022-04-27: 0.5 mg via INTRAVENOUS

## 2022-04-27 MED ORDER — 0.9 % SODIUM CHLORIDE (POUR BTL) OPTIME
TOPICAL | Status: DC | PRN
  Administered 2022-04-27: 1000 mL

## 2022-04-27 MED ORDER — ROCURONIUM BROMIDE 10 MG/ML (PF) SYRINGE
PREFILLED_SYRINGE | INTRAVENOUS | Status: AC
  Filled 2022-04-27: qty 10

## 2022-04-27 MED ORDER — LACTATED RINGERS IV SOLN
INTRAVENOUS | Status: DC
Start: 2022-04-27 — End: 2022-04-27

## 2022-04-27 MED ORDER — HYDRALAZINE HCL 20 MG/ML IJ SOLN: 5.0000 mg | Freq: Once | INTRAMUSCULAR | Status: AC

## 2022-04-27 SURGICAL SUPPLY — 36 items
ALCOHOL 70% 16 OZ (MISCELLANEOUS) ×2 IMPLANT
BAG COUNTER SPONGE SURGICOUNT (BAG) ×2 IMPLANT
BLADE SURG 15 STRL LF DISP TIS (BLADE) ×1 IMPLANT
BLADE SURG 15 STRL SS (BLADE) ×2
COVER SURGICAL LIGHT HANDLE (MISCELLANEOUS) ×2 IMPLANT
GAUZE 4X4 16PLY ~~LOC~~+RFID DBL (SPONGE) ×2 IMPLANT
GAUZE PACKING FOLDED 2  STR (GAUZE/BANDAGES/DRESSINGS) ×2
GAUZE PACKING FOLDED 2 STR (GAUZE/BANDAGES/DRESSINGS) ×1 IMPLANT
GLOVE SURG ENC MOIS LTX SZ6.5 (GLOVE) ×2 IMPLANT
GLOVE SURG POLYISO LF SZ6 (GLOVE) ×2 IMPLANT
GOWN STRL REUS W/ TWL LRG LVL3 (GOWN DISPOSABLE) ×2 IMPLANT
GOWN STRL REUS W/TWL LRG LVL3 (GOWN DISPOSABLE) ×4
KIT BASIN OR (CUSTOM PROCEDURE TRAY) ×2 IMPLANT
KIT TURNOVER KIT B (KITS) ×2 IMPLANT
MANIFOLD NEPTUNE II (INSTRUMENTS) ×2 IMPLANT
NDL BLUNT 16X1.5 OR ONLY (NEEDLE) ×1 IMPLANT
NDL DENTAL 27 LONG (NEEDLE) ×2 IMPLANT
NEEDLE BLUNT 16X1.5 OR ONLY (NEEDLE) ×2 IMPLANT
NEEDLE DENTAL 27 LONG (NEEDLE) ×4 IMPLANT
NS IRRIG 1000ML POUR BTL (IV SOLUTION) ×2 IMPLANT
PACK EENT II TURBAN DRAPE (CUSTOM PROCEDURE TRAY) ×2 IMPLANT
PAD ARMBOARD 7.5X6 YLW CONV (MISCELLANEOUS) ×2 IMPLANT
SPONGE SURGIFOAM ABS GEL 100 (HEMOSTASIS) IMPLANT
SPONGE SURGIFOAM ABS GEL 12-7 (HEMOSTASIS) IMPLANT
SPONGE SURGIFOAM ABS GEL SZ50 (HEMOSTASIS) ×1 IMPLANT
SUCTION FRAZIER HANDLE 10FR (MISCELLANEOUS) ×2
SUCTION TUBE FRAZIER 10FR DISP (MISCELLANEOUS) ×1 IMPLANT
SUT CHROMIC 3 0 PS 2 (SUTURE) ×4 IMPLANT
SUT CHROMIC 4 0 P 3 18 (SUTURE) IMPLANT
SYR 50ML SLIP (SYRINGE) ×2 IMPLANT
SYR BULB IRRIG 60ML STRL (SYRINGE) ×2 IMPLANT
TOWEL GREEN STERILE FF (TOWEL DISPOSABLE) ×2 IMPLANT
TUBE CONNECTING 12X1/4 (SUCTIONS) ×2 IMPLANT
WATER STERILE IRR 1000ML POUR (IV SOLUTION) ×2 IMPLANT
WATER TABLETS ICX (MISCELLANEOUS) ×2 IMPLANT
YANKAUER SUCT BULB TIP NO VENT (SUCTIONS) ×2 IMPLANT

## 2022-04-27 NOTE — Anesthesia Postprocedure Evaluation (Signed)
Anesthesia Post Note  Patient: Lisa Ayala  Procedure(s) Performed: DENTAL RESTORATION/EXTRACTIONS (Mouth)     Patient location during evaluation: PACU Anesthesia Type: General Level of consciousness: awake and alert Pain management: pain level controlled Vital Signs Assessment: post-procedure vital signs reviewed and stable Respiratory status: spontaneous breathing, nonlabored ventilation and respiratory function stable Cardiovascular status: blood pressure returned to baseline and stable Postop Assessment: no apparent nausea or vomiting Anesthetic complications: no   No notable events documented.  Last Vitals:  Vitals:   04/27/22 1115 04/27/22 1130  BP: 132/83 132/82  Pulse: 64 61  Resp: 17 15  Temp:  36.4 C  SpO2: 92% 92%    Last Pain:  Vitals:   04/27/22 1130  TempSrc:   PainSc: 3                  Anthone Prieur,W. EDMOND

## 2022-04-27 NOTE — Anesthesia Procedure Notes (Signed)
Procedure Name: Intubation Date/Time: 04/27/2022 9:17 AM  Performed by: Quentin Ore, CRNAPre-anesthesia Checklist: Patient identified, Emergency Drugs available, Suction available and Patient being monitored Patient Re-evaluated:Patient Re-evaluated prior to induction Oxygen Delivery Method: Circle system utilized Preoxygenation: Pre-oxygenation with 100% oxygen Induction Type: IV induction Ventilation: Mask ventilation without difficulty Laryngoscope Size: Glidescope and 3 Nasal Tubes: Nasal Rae and Right Tube size: 7.0 mm Number of attempts: 1 Placement Confirmation: ETT inserted through vocal cords under direct vision, positive ETCO2 and breath sounds checked- equal and bilateral Tube secured with: Tape Dental Injury: Teeth and Oropharynx as per pre-operative assessment  Comments: Grade I view on Glidescope screen.

## 2022-04-27 NOTE — Interval H&P Note (Signed)
History and Physical Interval Note:  04/27/2022   Lisa Ayala  has presented today for surgery, with the diagnosis of dental caries.  The various methods of treatment have been discussed with the patient and family. After consideration of risks, benefits and other options for treatment, the patient has consented to the procedure(s): MULTIPLE EXTRACTIONS WITH ALVEOLOPLASTY as a surgical intervention.  The patient's history has been reviewed, patient examined, no change in status, stable for surgery.  I have reviewed the patient's chart and labs.  Questions were answered to the patient's satisfaction.     Alda Berthold, DMD

## 2022-04-27 NOTE — Op Note (Signed)
[facility] Department of Dental Medicine   OPERATIVE REPORT  DATE OF SURGERY:   04/27/2022  PATIENT'S NAME:   Lisa Ayala DATE OF BIRTH:   1966-03-15 MEDICAL RECORD NUMBER:  [id number]  SURGEON:   Deeanne Deininger B. 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  MEDICATIONS: Ancef 2 g IV prior to invasive dental procedures. Local anesthesia with a total utilization of 2 cartridges of 34 mg of lidocaine with 0.018 mg of epinephrine/ea.  SPECIMENS:  11 teeth that were extracted and discarded  DRAINS/CULTURES:  None  COMPLICATIONS:  None  ESTIMATED BLOOD LOSS:  5 mL  INTRAVENOUS FLUIDS:  10 mL of Lactated ringers solution  INDICATIONS:  The patient was recently diagnosed with significant mitral tricuspid and aortic valve disease and is anticipating triple valve replacement and Maze procedure.  A medically necessary dental consult was then requested to evaluate the patient for any dental/orofacial infection and  their overall oral health.  The patient was examined and subsequently treatment planned for full mouth extractions due to severe decay and periodontal disease .  This treatment plan was made to decrease the perioperative and postoperative risks and complications associated with dental/orofacial infection from affecting the patient's systemic health.  OPERATIVE FINDINGS:  The patient was examined in operating room number 8.  The indicated teeth were identified and verified for extraction. The patient was noted be affected by severe dental decay and periodontal disease.  DESCRIPTION OF PROCEDURE:  The patient was identified in the holding area and brought to the main operating room number 8 by the anesthesia team. The patient was then placed in the supine position on the operating table.  General anesthesia was then induced per the anesthesia team. The patient was then prepped and draped in the usual sterile fashion for dental medicine procedures.  A timeout was performed. The patient was identified and procedures were verified. A throat pack was placed at this time. The oral cavity was then thoroughly examined with the findings noted above. The patient was then ready for the dental medicine procedure as follows:   ANESTHESIA: Local anesthesia was administered sequentially with a total utilization of 2 cartridges each containing 34 mg of lidocaine with 0.018 mg of epinephrine.  Location of anesthesia included upper right+left infiltration and palatal, and lower right+left mental nerve block and infiltration.  Aspiration negative.  ROUTINE EXTRACTIONS: The maxillary left and right quadrants were first approached. The teeth were then subluxated with a series of straight elevators.  Teeth numbers 5, 6, 10, 11 and 13 were then removed with a  150 forceps and rongeurs without complications.  Alveoloplasty was then performed utilizing a ronguers and bone file.  The tissues were approximated and trimmed  appropriately to help achieve primary closure.  The surgical sites were then irrigated with copious amounts of sterile saline.  Surgi Foam was placed in each extraction site.   The surgical sites were closed using 3-0 chromic gut sutures as follows: 3 figure-8 style sutures.  The mandibular left and right quadrants were then approached. The teeth were subluxated with a series of straight elevators.  Teeth numbers 19, 21, 26, 27 and 28 were then removed utilizing a 151 forceps and rongeurs without complications. The tissues were approximated and trimmed appropriately to help achieve primary closure. The surgical sites were then irrigated with copious amounts of sterile saline.  Surgi Foam was placed in each extraction site.  The surgical sites were closed using 3-0 chromic gut sutures as follows: 1 simple interrupted and 2 figure-8 style sutures.  SURGICAL EXTRACTIONS: Routine forceps extraction was attempted on tooth number 12 as described above.  Tooth number 12 broke off to the gingival margin.  A 15 blade incision was then made from the distal of number 11 to the distal of 13.  A FTMP flap was then carefully reflected with the periosteal elevator.  A surgical handpiece was used with copious amounts of sterile irrigation to remove mesial and buccal bone.  Tooth number 12 and all remaining root tips were extracted using East/West elevators and rongeurs without complications.  Alveoloplasty was performed utilizing a rongeurs and bone file.  The tissues were approximated and trimmed appropriately to help achieve primary closure.  The surgical site was then irrigated with copious amounts of sterile saline.  Surgi Foam was placed in the extraction site.  The surgical sites were closed using 3-0 chromic gut sutures as described above.   END OF PROCEDURE: Thorough oral irrigation with sterile saline was performed.  Hemostasis was observed.  The patient was examined for complications, and seeing none, the dental  medicine procedure was deemed to be complete.  The throat pack was removed at this time.  A series of 4x4 gauze were placed in the mouth to aid hemostasis as needed.  The patient was then handed over to the anesthesia team for final disposition.  After an appropriate amount of time, the patient was extubated and taken to the postanesthsia care unit in stable condition.  All counts were correct for the dental medicine procedure.    Ebbie Sorenson B. Chales Salmon, DMD    Phone: [phone]    Pager:  [phone]

## 2022-04-27 NOTE — Transfer of Care (Signed)
Immediate Anesthesia Transfer of Care Note  Patient: Lisa Ayala  Procedure(s) Performed: DENTAL RESTORATION/EXTRACTIONS (Mouth)  Patient Location: PACU  Anesthesia Type:General  Level of Consciousness: awake, alert  and oriented  Airway & Oxygen Therapy: Patient Spontanous Breathing and Patient connected to face mask oxygen  Post-op Assessment: Report given to RN, Post -op Vital signs reviewed and stable and Patient moving all extremities X 4  Post vital signs: Reviewed and stable  Last Vitals:  Vitals Value Taken Time  BP 166/104 04/27/22 1038  Temp    Pulse 67 04/27/22 1041  Resp 15 04/27/22 1041  SpO2 93 % 04/27/22 1041  Vitals shown include unvalidated device data.  Last Pain:  Vitals:   04/27/22 0734  TempSrc: Oral  PainSc: 0-No pain      Patients Stated Pain Goal: 2 (04/26/22 1344)  Complications: No notable events documented.

## 2022-04-27 NOTE — Discharge Instructions (Signed)
[facility] DEPARTMENT OF DENTAL MEDICINE Dr. Wyn Forster B. Chales Salmon, D.M.D. Phone: [phone] Fax: [phone]    MOUTH CARE AFTER SURGERY     FACTS: Ice used in ice bag helps keep the swelling down, and can help lessen the pain for the first 24 hours after surgery. It is easier to treat pain BEFORE it happens. Spitting disturbs the clot and may cause bleeding to start again, or to get worse. Smoking delays healing and can cause complications. Sharing prescriptions can be dangerous.  Do not take medications not recently prescribed for you. Antibiotics may stop birth control pills from working.  Use other means of birth control while on antibiotics. Warm salt water rinses after the first 24 hours will help lessen the swelling:  Use 1/2 teaspoonful of table salt per oz.of water.    DO NOT: Spit Drink through a straw It is strongly advised not to smoke, dip snuff or chew tobacco for at least 3 days. Eat sharp or crunchy foods.  Avoid the area of surgery when chewing. Stop your antibiotics before your instructions say to do so. Eat hot foods until bleeding has stopped.  If you need to, let your food cool down to room temperature.      WHAT TO EXPECT: Some swelling, especially during the first 2-3 days. Soreness or discomfort in varying degrees.  Follow your dentist's instructions about how to handle pain before it starts. Pinkish saliva or light blood in saliva, or on your pillow in the morning.  This can last around 24 hours. Bruising inside or outside the mouth.  This may not show up until 2-3 days after surgery.  Don't worry, it will go away in time. Pieces of "bone" may work themselves loose.  It's OK.  If they bother you, let us know.     WHAT TO DO IMMEDIATELY AFTER SURGERY: Bite on gauze with steady pressure for 30-45 minutes at a time.  Switch out the gauze after 30-45 minutes for clean gauze, and continue this for 1-2 hours or until  bleeding subsides. Do not chew on the gauze. Do not lie down flat.  Raise your head support especially for the first 24 hours. Apply ice to your face on the side of the surgery.  You may apply it 20 minutes on and a few minutes off.  Ice for 8-12 hours.  You may use ice up to 24 hours. Before the numbness wears off, take a pain pill as instructed. Prescription pain medication is not always required. Alternate between Tylenol (500 mg) and Ibuprofen (600 mg) every 4-6 hours for up to 3 days.  This works the best for managing pain after teeth extractions (unless you are are not able to take NSAIDs or Acetaminophen due to other medical issues).     SWELLING: Expect swelling for the first couple of days.  It should get better after that. If swelling increases 3 days or so after surgery, let us know as soon as possible.    FEVER: Take Tylenol every 4 hours if needed to lower your temperature, especially if it is at 100oF or higher. Drink lots of fluids. If the fever does not go away, let us know.    BREATHING: Any unusual difficulty breathing means you have to have someone bring you to the emergency room ASAP.    BLEEDING: Light oozing is expected for 24 hours or so. Prop head up with pillows. Do not spit. Do not confuse bright red fresh flowing  blood with lots of saliva colored with a little bit of blood. If you notice some bleeding, place gauze or a tea bag where it is bleeding and apply CONSTANT pressure by biting down for 1 hour.  Avoid talking during this time.  Do not remove the gauze or tea bag during this hour to "check" the bleeding. If you notice bright RED bleeding FLOWING out of particular area, and filling the floor of your mouth, put a wad of gauze on that area, bite down firmly and constantly.  Call us immediately.  If we're closed, have someone bring you to the emergency room.     ORAL HYGIENE: Brush your teeth as usual after meals and before bedtime. Use a soft toothbrush  around the area of surgery. DO NOT AVOID BRUSHING.  Otherwise bacteria(germs) will grow and may delay healing or encourage infection. Since you cannot spit, just gently rinse and let the water flow out of your mouth. DO NOT SWISH HARD.     EATING: Cool liquids are a good point to start.  Increase to soft foods as tolerated.     PRESCRIPTIONS: Follow the directions for your prescriptions exactly as written. If your doctor gave you a narcotic pain medication, do not drive, operate machinery or drink alcohol when on that medication.    Questions?  Call our office during office hours at [phone] or call the Emergency Room at [phone].

## 2022-04-28 ENCOUNTER — Telehealth (HOSPITAL_COMMUNITY): Payer: Self-pay | Admitting: Licensed Clinical Social Worker

## 2022-04-28 ENCOUNTER — Encounter (HOSPITAL_COMMUNITY): Payer: Self-pay | Admitting: Dentistry

## 2022-04-28 NOTE — Telephone Encounter (Signed)
H&V Care Navigation CSW Progress Note  Clinical Social Worker Pt and pt dtr reached out to ask for help getting letter explaining pt medical condition and need for dtr to be with her to help with caregiving so that dtr can get out of her current lease early as she cannot afford to keep up now that she is in [place] helping the patient.  CSW wrote letter and sent to management company.   SDOH Screenings   Alcohol Screen: Not on file  Depression (PHQ2-9): Low Risk  (04/11/2022)   Depression (PHQ2-9)    PHQ-2 Score: 0  Financial Resource Strain: High Risk (03/20/2022)   Overall Financial Resource Strain (CARDIA)    Difficulty of Paying Living Expenses: Very hard  Food Insecurity: Food Insecurity Present (03/06/2022)   Hunger Vital Sign    Worried About Running Out of Food in the Last Year: Sometimes true    Ran Out of Food in the Last Year: Sometimes true  Housing: Medium Risk (03/06/2022)   Housing    Last Housing Risk Score: 1  Physical Activity: Not on file  Social Connections: Not on file  Stress: Not on file  Tobacco Use: Low Risk  (04/28/2022)   Patient History    Smoking Tobacco Use: Never    Smokeless Tobacco Use: Never    Passive Exposure: Not on file  Transportation Needs: Unmet Transportation Needs (03/20/2022)   PRAPARE - Transportation    Lack of Transportation (Medical): Yes    Lack of Transportation (Non-Medical): Yes   Burna Sis, LCSW Clinical Social Worker [facility] Desk#: [phone] Cell#: [phone]

## 2022-05-01 NOTE — Telephone Encounter (Signed)
Patient called to ask for assistance with gas to an appointment for follow up at [facility]. CSW provided gift cards and patient will pick up later today from the clinic. Lasandra Beech, LCSW, CCSW-MCS [phone]

## 2022-05-03 ENCOUNTER — Encounter (HOSPITAL_COMMUNITY): Payer: Self-pay | Admitting: Dentistry

## 2022-05-03 ENCOUNTER — Other Ambulatory Visit (HOSPITAL_COMMUNITY): Payer: Self-pay

## 2022-05-03 ENCOUNTER — Ambulatory Visit (INDEPENDENT_AMBULATORY_CARE_PROVIDER_SITE_OTHER): Payer: Self-pay | Admitting: Dentistry

## 2022-05-03 ENCOUNTER — Other Ambulatory Visit (HOSPITAL_COMMUNITY): Payer: Self-pay | Admitting: Internal Medicine

## 2022-05-03 VITALS — BP 157/76 | HR 85 | Temp 97.7°F

## 2022-05-03 DIAGNOSIS — K08199 Complete loss of teeth due to other specified cause, unspecified class: Secondary | ICD-10-CM | POA: Insufficient documentation

## 2022-05-03 MED ORDER — QUETIAPINE FUMARATE 25 MG PO TABS
25.0000 mg | ORAL_TABLET | Freq: Every evening | ORAL | 0 refills | Status: DC | PRN
  Filled 2022-05-03 – 2022-05-17 (×2): qty 30, 30d supply, fill #0

## 2022-05-03 MED ORDER — PANTOPRAZOLE SODIUM 40 MG PO TBEC
40.0000 mg | DELAYED_RELEASE_TABLET | Freq: Every day | ORAL | 0 refills | Status: DC
  Filled 2022-05-03 – 2022-05-17 (×2): qty 30, 30d supply, fill #0

## 2022-05-03 NOTE — Progress Notes (Signed)
Department of Dental Medicine   Service Date:   05/03/2022  Patient Name:  Lisa Ayala Date of Birth:   November 01, 1965 Medical Record Number: [id number]  TODAY'S VISIT: POSTOPERATIVE APPOINTMENT   ASSESSMENT: The patient continues to heal well and consistent with dental procedures performed.  RECOMMENDATIONS: Establish dental care at an outside office of the patient's choice for routine care including replacement of missing teeth as needed.  PLAN: Follow-up as needed.  Discussed in detail all treatment options and recommendations with the patient and they are agreeable to the plan.   05/03/2022   HISTORY OF PRESENT ILLNESS: Lisa Ayala presents today for a postoperative visit status-post multiple extractions with alveoloplasty in the operating room on 04/27/22.   Medical and dental history reviewed with the patient.  No changes reported. Her heart surgery at [facility] is scheduled for tomorrow (05/04/22) as planned.   CHIEF COMPLAINT:   Here for a postoperative appointment; patient with no complaints.  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 08/2021 Echo: EF 70-75%, no rwma, mod dil LV, GrII DD, nl RV fxn, massively dil LA, mild-mod dil RA, severe MR due to lack of central coaptation of MV leaflets. Mild MS. Mod TR. Mild-mod AI.   Tricuspid regurgitation    Past Surgical History:  Procedure Laterality Date   DILATION AND CURETTAGE OF UTERUS     RIGHT/LEFT HEART CATH AND CORONARY ANGIOGRAPHY N/A 02/10/2022   Procedure: RIGHT/LEFT HEART CATH AND CORONARY ANGIOGRAPHY;  Surgeon: Jolaine Artist, MD;  Location: [facility];  Service: Cardiovascular;  Laterality: N/A;   TEE WITHOUT CARDIOVERSION N/A 02/08/2022   Procedure: TRANSESOPHAGEAL ECHOCARDIOGRAM (TEE);  Surgeon: Geralynn Rile, MD;  Location: [facility];  Service: Cardiovascular;  Laterality: N/A;   TOOTH EXTRACTION N/A 04/27/2022   Procedure: DENTAL RESTORATION/EXTRACTIONS;  Surgeon: Charlaine Dalton, DMD;  Location: [facility];  Service: Dentistry;  Laterality: N/A;   Current Outpatient Medications  Medication Sig Dispense Refill   amiodarone (PACERONE) 200 MG tablet Take 1 tablet (200 mg total) by mouth 2 (two) times daily. 180 tablet 3   apixaban (ELIQUIS) 5 MG TABS tablet Take 1 tablet (5 mg total) by mouth 2 (two) times daily. 60 tablet 1   dapagliflozin propanediol (FARXIGA) 10 MG TABS tablet Take 1 tablet (10 mg total) by mouth daily. 90 tablet 3   magnesium oxide (MAG-OX) 400 MG tablet Take 1 tablet (400 mg total) by mouth 2 (two) times daily. 60 tablet 0   metoprolol succinate (TOPROL XL) 25 MG 24 hr tablet Take 0.5 tablets (12.5 mg total) by mouth daily. 45 tablet 3   Multiple  Vitamin (MULTIVITAMIN ADULT PO) Take 1 tablet by mouth daily.     pantoprazole (PROTONIX) 40 MG tablet Take 1 tablet (40 mg total) by mouth at bedtime. 30 tablet 0   polyethylene glycol powder (GLYCOLAX/MIRALAX) 17 GM/SCOOP powder Take 17 g by mouth daily dissolved in water. 238 g 0   QUEtiapine (SEROQUEL) 25 MG tablet Take 1 tablet (25 mg total) by mouth at bedtime as needed  (sleep). 30 tablet 0   sacubitril-valsartan (ENTRESTO) 24-26 MG Take 1 tablet by mouth 2 (two) times daily. 180 tablet 3   spironolactone (ALDACTONE) 25 MG tablet Take 1/2 tablet (12.5 mg total) by mouth daily. 45 tablet 3   No current facility-administered medications for this visit.   No Known Allergies   LABS: Lab Results  Component Value Date   WBC 9.9 04/27/2022   HGB 15.0 04/27/2022   HCT 46.5 (H) 04/27/2022   MCV 87.4 04/27/2022   PLT 323 04/27/2022   BMET    Component Value Date/Time   NA 142 04/27/2022 0807   NA 140 09/02/2021 0908   K 4.5 04/27/2022 0807   CL 105 04/27/2022 0807   CO2 23 04/27/2022 0807   GLUCOSE 116 (H) 04/27/2022 0807   BUN 22 (H) 04/27/2022 0807   BUN 19 09/02/2021 0908   CREATININE 1.59 (H) 04/27/2022 0807   CALCIUM 10.5 (H) 04/27/2022 0807   EGFR 69 09/02/2021 0908   GFRNONAA 38 (L) 04/27/2022 0807    Lab Results  Component Value Date   INR 1.3 (H) 02/07/2022   INR 1.7 (H) 07/02/2021   No results found for: "PTT"   VITALS: BP (!) 157/76 (BP Location: Right Arm, Patient Position: Sitting, Cuff Size: Normal)   Pulse 85   Temp 97.7 F (36.5 C) (Oral)   LMP 02/14/2022 (Approximate)    EXAM: Extraction sites appear to be healing WNL.  No signs of wound dehiscence or infection evident upon examination. 1 small suture remains in-tact and is working its way out.   ASSESSMENT:   Postoperative course is consistent with dental procedures performed.   PLAN AND RECOMMENDATIONS: Follow-up as needed in the hospital dental clinic.    Establish care at an outside dental office for routine dental care including replacement of missing teeth as needed and exams following cardiac surgery and medical optimization.   Recommend that the patient discuss plans to return to the dentist for non-urgent treatment with medical team to ensure they are medically optimized and for new antibiotic prophylaxis recommendations. Call if any questions or  concerns arise.  All questions and concerns were invited and addressed.  The patient tolerated today's visit well and departed in stable condition.  -Sandi Mariscal, DMD

## 2022-05-04 DIAGNOSIS — I351 Nonrheumatic aortic (valve) insufficiency: Secondary | ICD-10-CM | POA: Insufficient documentation

## 2022-05-05 DIAGNOSIS — G8918 Other acute postprocedural pain: Secondary | ICD-10-CM | POA: Insufficient documentation

## 2022-05-05 DIAGNOSIS — D62 Acute posthemorrhagic anemia: Secondary | ICD-10-CM | POA: Insufficient documentation

## 2022-05-05 DIAGNOSIS — Z8679 Personal history of other diseases of the circulatory system: Secondary | ICD-10-CM | POA: Insufficient documentation

## 2022-05-09 ENCOUNTER — Telehealth (HOSPITAL_COMMUNITY): Payer: Self-pay

## 2022-05-09 ENCOUNTER — Telehealth: Payer: Self-pay | Admitting: Critical Care Medicine

## 2022-05-09 DIAGNOSIS — I340 Nonrheumatic mitral (valve) insufficiency: Secondary | ICD-10-CM

## 2022-05-09 NOTE — Telephone Encounter (Signed)
Rolly Salter a nurse with [facility] called wanting to know if the office can do an INR Thursday for the patient,  She just underwent a Mitral Valve replacement and is discharging tomorrow.  She will need a fu which I scheduled but she needs labs on Thursday  CB@  [phone]

## 2022-05-09 NOTE — Telephone Encounter (Signed)
Luke pls do a POC INR OV with this patient on Thursday this week  if you cannot do this pls enter INR PT LAB order and CARLY:  pls contact the patient if luke unable to see Thursday THIS WEEK

## 2022-05-09 NOTE — Telephone Encounter (Signed)
[facility] called wanting to know if we could have the patient come in for an INR Friday 8/18 due to her being discharged recently from having her Mitral valve repaired. Please advise

## 2022-05-11 ENCOUNTER — Telehealth: Payer: Self-pay | Admitting: Critical Care Medicine

## 2022-05-11 ENCOUNTER — Ambulatory Visit: Payer: Self-pay | Admitting: Pharmacist

## 2022-05-11 DIAGNOSIS — R5381 Other malaise: Secondary | ICD-10-CM

## 2022-05-11 DIAGNOSIS — Z952 Presence of prosthetic heart valve: Secondary | ICD-10-CM

## 2022-05-11 DIAGNOSIS — I5032 Chronic diastolic (congestive) heart failure: Secondary | ICD-10-CM

## 2022-05-11 DIAGNOSIS — I051 Rheumatic mitral insufficiency: Secondary | ICD-10-CM

## 2022-05-11 NOTE — Telephone Encounter (Signed)
Copied from CRM [id number]. Topic: General - Other >> May 11, 2022  9:27 AM Clide Dales wrote: Patient's daughter is requesting patient be sent to a rehab center when discharged from [facility]. Daughter said patient is not strong enough to come home. Please advise.

## 2022-05-12 ENCOUNTER — Other Ambulatory Visit (HOSPITAL_COMMUNITY): Payer: Self-pay

## 2022-05-12 NOTE — Telephone Encounter (Signed)
Called patient and spoke with daughter she stated that she spoke with a PA at the hospital about rehab and is now requesting if we can refer her to a outpatient rehab center help with her strength.  Patients is being discharged today as well.  She is also needing a appt with you Lisa Ayala for INR, do you want me to double book or do first available

## 2022-05-14 NOTE — Telephone Encounter (Signed)
It does not appear she made her appt with luke  It is CRITICAL she come Monday 8/21 for a PT INR POCT  with Franky Macho we need immediate result no lab draw  Also needs TOC OV with me , I see appt with angela see about getting her in sooner for appt with me ok to Eustaquio Boyden : can you do a TOC call this week?  I made referral to PM and R and also to PT /OT

## 2022-05-15 ENCOUNTER — Ambulatory Visit (HOSPITAL_COMMUNITY)
Admission: RE | Admit: 2022-05-15 | Discharge: 2022-05-15 | Disposition: A | Discharge status: Home / Self Care | Payer: Medicaid Other | Source: Ambulatory Visit | Attending: Internal Medicine | Admitting: Internal Medicine

## 2022-05-15 DIAGNOSIS — I340 Nonrheumatic mitral (valve) insufficiency: Secondary | ICD-10-CM | POA: Diagnosis not present

## 2022-05-15 LAB — PROTIME-INR
INR: 2 — ABNORMAL HIGH (ref 0.8–1.2)
Prothrombin Time: 22.7 seconds — ABNORMAL HIGH (ref 11.4–15.2)

## 2022-05-15 NOTE — Telephone Encounter (Signed)
I spoke to patient this morning and she was on her way to have labs done.   She has an appointment today at 1100 at [facility] for labs.   I also rescheduled her hospital follow up appointment at [facility] and informed her daughter that the appointment is now 05/23/2022 with Dr Delford Field.

## 2022-05-15 NOTE — Telephone Encounter (Signed)
Noted  

## 2022-05-15 NOTE — Addendum Note (Signed)
Addended byRob Bunting R on: 05/15/2022 10:12 AM   Modules accepted: Orders

## 2022-05-16 ENCOUNTER — Other Ambulatory Visit (HOSPITAL_COMMUNITY): Payer: Self-pay

## 2022-05-17 ENCOUNTER — Other Ambulatory Visit (HOSPITAL_COMMUNITY): Payer: Self-pay

## 2022-05-22 NOTE — Progress Notes (Unsigned)
New Patient Office Visit  Subjective    Patient ID: Lisa Ayala, female    DOB: [birth year]  Age: 56 y.o. MRN: [id number]  CC:  No chief complaint on file.   HPI REMEE Ayala presents to establish care and for post hospital follow-up This patient was admitted for a prolonged period of time in mid May with documentations as below for the rehab portion of the stay and also recapitulation of the hospital stay prior to the rehab.  This patient has significant mitral tricuspid and aortic valve disease and will need replacements of all the valves along with a Maze procedure and atrial appendage clipping on the left atrium.  This is all to be done at [facility].  Patient is seen today for primary care to establish.  She did see one of our nurse practitioners in June briefly.  She has since seen cardiology as documented below. Seen by NP nichols 02/2022 Hospital Admission: 02/21/22   Hospital Course: LATOSHA GAYLORD was admitted to rehab 02/21/2022 for inpatient therapies to consist of PT, ST and OT at least three hours five days a week. Past admission physiatrist, therapy team and rehab RN have worked together to provide customized collaborative inpatient rehab.  Of CBC showed H&H to be relatively stable and leukocytosis had resolved.  She has been afebrile during her stay and respiratory status has been stable.  Mild AKI noted and stable.  Her blood pressures were monitored on TID basis and have been stable.  No cardiac symptoms reported with increase in activity.  No signs of overload noted.  Dapagliflozin was added at discharge per cardiology recommendations.  She continues on trazodone to help manage sleep-wake disruption.  She made great gains during her rehab stay and is modified independent.  No further therapy recommended at this time.  She is to follow-up with cardiology for input on cardiac rehab.  She was discharged to home in improved condition.      Rehab course: During  patient's stay in rehab team conference was held to discuss patient's progress, set goals and barriers to discharge. At admission, patient required supervision with mobility and basic ADL tasks. Cognistat was [facility] with exception of one-point below normal for short-term recall with reports of mild memory change. She she is able to complete ADL tasks at modified independent level.  She is modified independent for transfers and is able to ambulate 1400 feet without rest breaks.  She is able to complete mildly complex for functional tasks at modified independent level.  Daughter will provide supervision and assistance as needed after discharge.     HPI   Lisa Ayala is a 56 y.o. female with history of PSVT, severe MR, moderate TR and mild to moderate AI, anxiety disorder, regular marijuana use, cocaine use.    Patient presents today for a hospital follow-up.  Please see hospital notes above.  Patient states that she has been doing well since hospital discharge.  Her daughter is here with her today and is helping care for her.  Patient is ambulating independently.  She states that she was able to get all of her medications at hospital discharge and has been taking them as directed.  Patient does have her follow-up appointments already scheduled.  She will be seeing cardiology next week and she also has an appointment to establish care with [facility] at the end of the month.  Patient also has an appointment scheduled with  cardiothoracic surgery for further evaluation and possible work-up for mitral valve replacement. Denies f/c/s, n/v/d, hemoptysis, PND, leg swelling Denies chest pain or edema   Hosp DC 01/2022 Admit date: 02/21/2022 Discharge date: 02/24/2022   Discharge Diagnoses:  Principal Problem:   Anoxic brain injury [facility]) Active Problems:   Severe mitral regurgitation   Chronic diastolic heart failure [facility])     Discharged Condition: stable     Brief HPI:    Lisa Ayala is a 56 y.o. female with history of PSVT, severe MR, moderate TR and mild to moderate AI, anxiety disorder, regular marijuana use who was admitted on 02/07/2022 via cardiology office with reports of tachycardia and rapid breathing with SOB.  She was found to have SVT with a flutter, was treated with Herbert Pun sent and started on IV heparin with plans for TEE.  She did develop hypoxia with acute on chronic diastolic heart failure, demand ischemia and found to have transaminitis as well as UDS positive for THC and cocaine.  She was treated with IV diuresis and started on amiodarone due to ongoing issues with a flutter.   She did have worsening of respiratory status prior to TEE requiring intubation and pressors but deteriorated to PEA arrest requiring CPR and epi x1 minute with ROSC.  Hospital course significant for issues with agitation, SVT with heart rates up to 200 requiring emergent DCCV on 05/19 and 05/20 as well as fevers due to PNA requiring Zosyn.  Dr. Roxan Hockey was consulted for input and recommended MVR once medically optimized.  She tolerated extubation on 05/23 and was placed on regular diet.  PT/OT evaluation completed and patient noted to have weakness with balance deficits.  CIR was recommended due to functional decline.     Hospital Course: Lisa Ayala was admitted to rehab 02/21/2022 for inpatient therapies to consist of PT, ST and OT at least three hours five days a week. Past admission physiatrist, therapy team and rehab RN have worked together to provide customized collaborative inpatient rehab.  Of CBC showed H&H to be relatively stable and leukocytosis had resolved.  She has been afebrile during her stay and respiratory status has been stable.  Mild AKI noted and stable.  Her blood pressures were monitored on TID basis and have been stable.  No cardiac symptoms reported with increase in activity.  No signs of overload noted.  Dapagliflozin was added at discharge per  cardiology recommendations.  She continues on trazodone to help manage sleep-wake disruption.  She made great gains during her rehab stay and is modified independent.  No further therapy recommended at this time.  She is to follow-up with cardiology for input on cardiac rehab.  She was discharged to home in improved condition.      Rehab course: During patient's stay in rehab team conference was held to discuss patient's progress, set goals and barriers to discharge. At admission, patient required supervision with mobility and basic ADL tasks. Cognistat was [facility] with exception of one-point below normal for short-term recall with reports of mild memory change. She she is able to complete ADL tasks at modified independent level.  She is modified independent for transfers and is able to ambulate 1400 feet without rest breaks.  She is able to complete mildly complex for functional tasks at modified independent level.  Daughter will provide supervision and assistance as needed after discharge.   Disposition: Home  Below is heart failure clinic documentations just this week Saw B[facility]ohn 7/14: Ms Nestor is a  56 y.o. with a history of SVT and severe MR. Echo 08/2021 LVEF 70-75% with severe MR, mild MS, at least Mod TR and planned for TEE   Followed by EP for SVT. She was seen 07/2021. TEE had been planned to evaluate MR. She no showed for TEE.     Admitted 6/23 from EP office with SVT vs AFL, HR 145. UDS + cocaine. Given adenosine 6, 12, and 12 in ED. TEE was scheduled but aborted due to flash pulmonary edema.  AHF team consulted. PEA arrested in Endo. CPR , epi, and bicarb given with ROSC. Urgent intubation. Felt to have component of septic shock 2/2 PNA, as well as CS. Given IV lasix, started on milrinone and NE. Transferred to ICU. Underwent L/RHC showing  nonobstructive CAD. RA 9, PA 50/10, mean PCWP 12, CI 6.8.  Went into rapid SVT, and underwent emergent DCCV. Extubated, and drips weaned. Amio added for  rate control. TCTS planning for MVR with MAZE once recovered from acute illness. Hospitalization c/b AKI, anxiety and hypertension. Required CIR for rehab. Discharged home after CIR, weight 141 lbs.   Her for f/u with her daughter. Feeling fine. Denies CP or SOB. Doing all ADLs without problem. Going to store. No palpitations, CP or SOB. No bleeding with Eliquis. Saw Dr. Cheree Ditto scheduled for triple valve surgery on 8/10.       1. HFpEF - Echo (5/23): EF 65-70%, Grade III DD, R mildly reduced, severe MR and rheumatic - Stable NYHA I-II Volume status looks good - Continue Farxiga 10 mg daily - Continue spiro 12.5 mg daily - Continue Entresto 24/26 mg bid. - Continue Toprol XL 12.5 mg daily.   2.  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  - Planning surgery on 05/04/22 with Dr. Cheree Ditto   3 . PAF/SVT - A flutter on admit. Spontaneously converted to SR.  - Multiple episodes of AF during hospitalization. - In NSR - Continue Eliquis 5 mg bid. No bleeding issues. - Continue amiodarone 200 mg bid. She does not do well out of rhythm. - Planning MAZE at time of MVR.    4. Tobacco/Polysubstance abuse  - UDS on admit + cocaine/THC - She is off smoking    Below is documentation from the [place] clinic visit previously his surgery scheduled for August 10 Has MVR/AVR scheduled 8/10 at [facility]  1. Continue your medical care with your established primary care provider and/or cardiologist.  2. Contact Dr. Aundra Millet office at Phone: [phone] and Fax: [phone] directly with any additional questions or concerns.  3. Dr. Cheree Ditto has recommended proceeding with CT surgical procedure to include aortic valve replacement/mitral valve replacement/possible tricuspid valve repair or replacement/MAZE and placement left atrial appendage clip.  4. Future date of  surgery scheduled Dr. Cheree Ditto: 05/04/2022.  5. Take last dose of Eliquis on 04/28/2022  6. Dr. Aundra Millet office will arrange for you to be admitted to [facility] 05/03/2022 for initiation of IV heparin blood thinner preoperatively.  7. You have been provided preoperative chlorhexidine skin wipes and associated instructions for use to begin at the house on the night of 05/02/2022.    Note the patient has significantly poor dentition and this has yet to be assessed.  Patient has no other complaints at this time.  On arrival blood pressure elevated 155/102.  She is very  stressed.  She is worried over her surgery.  Note blood pressures were normal at cardiology earlier this week.  Patient is compliant with all her medications.  Patient is accompanied by her daughter who goes with her to all her medical visits.  She is in [place] in Pharmacologist  The patient had memory deficits after her cardiac arrest in the hospital however these are rapidly improving This patient previously used cocaine but recently has been negative     Outpatient Encounter Medications as of 05/23/2022  Medication Sig   amiodarone (PACERONE) 200 MG tablet Take 1 tablet (200 mg total) by mouth 2 (two) times daily.   apixaban (ELIQUIS) 5 MG TABS tablet Take 1 tablet (5 mg total) by mouth 2 (two) times daily.   dapagliflozin propanediol (FARXIGA) 10 MG TABS tablet Take 1 tablet (10 mg total) by mouth daily.   magnesium oxide (MAG-OX) 400 MG tablet Take 1 tablet (400 mg total) by mouth 2 (two) times daily.   metoprolol succinate (TOPROL XL) 25 MG 24 hr tablet Take 0.5 tablets (12.5 mg total) by mouth daily.   Multiple Vitamin (MULTIVITAMIN ADULT PO) Take 1 tablet by mouth daily.   pantoprazole (PROTONIX) 40 MG tablet Take 1 tablet (40 mg total) by mouth at bedtime.   polyethylene glycol powder (GLYCOLAX/MIRALAX) 17 GM/SCOOP powder Take 17 g by mouth daily dissolved in water.    QUEtiapine (SEROQUEL) 25 MG tablet Take 1 tablet (25 mg total) by mouth at bedtime as needed (sleep).   sacubitril-valsartan (ENTRESTO) 24-26 MG Take 1 tablet by mouth 2 (two) times daily.   spironolactone (ALDACTONE) 25 MG tablet Take 0.5 tablets (12.5 mg total) by mouth daily.   No facility-administered encounter medications on file as of 05/23/2022.    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 08/2021 Echo: EF 70-75%, no rwma, mod dil LV, GrII DD, nl RV fxn, massively dil LA, mild-mod dil RA, severe MR due to lack of central coaptation of MV leaflets. Mild MS. Mod TR. Mild-mod AI.   Tricuspid regurgitation     Past Surgical History:  Procedure Laterality Date   DILATION AND CURETTAGE OF UTERUS     RIGHT/LEFT HEART CATH AND CORONARY ANGIOGRAPHY N/A 02/10/2022   Procedure: RIGHT/LEFT HEART CATH AND CORONARY ANGIOGRAPHY;  Surgeon: Jolaine Artist, MD;  Location: [place] [facility];  Service: Cardiovascular;  Laterality: N/A;   TEE WITHOUT CARDIOVERSION N/A 02/08/2022   Procedure: TRANSESOPHAGEAL ECHOCARDIOGRAM (TEE);  Surgeon: Geralynn Rile, MD;  Location: [place];  Service: Cardiovascular;  Laterality: N/A;   TOOTH EXTRACTION N/A 04/27/2022   Procedure: DENTAL RESTORATION/EXTRACTIONS;  Surgeon: Charlaine Dalton, DMD;  Location: [place];  Service: Dentistry;  Laterality: N/A;    Family History  Family history unknown: Yes    Social History   Socioeconomic History   Marital status: Divorced    Spouse name: Not on file   Number of children: Not on file   Years of education: Not on file   Highest education level: Not on file  Occupational History   Not on file  Tobacco Use   Smoking status: Never   Smokeless tobacco:  Never  Vaping Use   Vaping Use:  Never used  Substance and Sexual Activity   Alcohol use: Not Currently    Comment: Occasional   Drug use: Not Currently    Types: Marijuana, Cocaine, Other-see comments    Comment: No cocaine since March. Edible Marijuana for sleep- nightly   Sexual activity: Not Currently  Other Topics Concern   Not on file  Social History Narrative   Lives locally.  Relatively active.   Social Determinants of Health   Financial Resource Strain: High Risk (03/20/2022)   Overall Financial Resource Strain (CARDIA)    Difficulty of Paying Living Expenses: Very hard  Food Insecurity: Food Insecurity Present (03/06/2022)   Hunger Vital Sign    Worried About Running Out of Food in the Last Year: Sometimes true    Ran Out of Food in the Last Year: Sometimes true  Transportation Needs: Unmet Transportation Needs (03/20/2022)   PRAPARE - Hydrologist (Medical): Yes    Lack of Transportation (Non-Medical): Yes  Physical Activity: Not on file  Stress: Not on file  Social Connections: Not on file  Intimate Partner Violence: Not on file    Review of Systems  Constitutional:  Negative for chills, diaphoresis, fever, malaise/fatigue and weight loss.  HENT:  Negative for congestion, hearing loss, nosebleeds, sore throat and tinnitus.   Eyes:  Negative for blurred vision, photophobia and redness.  Respiratory:  Negative for cough, hemoptysis, sputum production, shortness of breath, wheezing and stridor.   Cardiovascular:  Negative for chest pain, palpitations, orthopnea, claudication, leg swelling and PND.  Gastrointestinal:  Negative for abdominal pain, blood in stool, constipation, diarrhea, heartburn, nausea and vomiting.  Genitourinary:  Negative for dysuria, flank pain, frequency, hematuria and urgency.  Musculoskeletal:  Negative for back pain, falls, joint pain, myalgias and neck pain.  Skin:  Negative for itching and rash.  Neurological:   Negative for dizziness, tingling, tremors, sensory change, speech change, focal weakness, seizures, loss of consciousness, weakness and headaches.  Endo/Heme/Allergies:  Negative for environmental allergies and polydipsia. Does not bruise/bleed easily.  Psychiatric/Behavioral:  Negative for depression, memory loss, substance abuse and suicidal ideas. The patient is not nervous/anxious and does not have insomnia.         Objective    LMP 02/14/2022 (Approximate)   Physical Exam Vitals reviewed.  Constitutional:      Appearance: Normal appearance. She is well-developed. She is not diaphoretic.  HENT:     Head: Normocephalic and atraumatic.     Nose: Nose normal. No nasal deformity, septal deviation, mucosal edema or rhinorrhea.     Right Sinus: No maxillary sinus tenderness or frontal sinus tenderness.     Left Sinus: No maxillary sinus tenderness or frontal sinus tenderness.     Mouth/Throat:     Mouth: Mucous membranes are moist.     Pharynx: No oropharyngeal exudate.     Comments: Extremely poor dentition several carious teeth seen with severe periodontal disease several missing teeth as well Eyes:     General: No scleral icterus.    Conjunctiva/sclera: Conjunctivae normal.     Pupils: Pupils are equal, round, and reactive to light.  Neck:     Thyroid: No thyromegaly.     Vascular: No carotid bruit or JVD.     Trachea: Trachea normal. No tracheal tenderness or tracheal deviation.  Cardiovascular:     Rate and Rhythm: Normal rate and regular rhythm.     Chest Wall: PMI is not displaced.     Pulses: Normal pulses.  No decreased pulses.     Heart sounds: S1 normal and S2 normal. Heart sounds not distant. Murmur heard.     No systolic murmur is present.     No diastolic murmur is present.     No friction rub. No gallop. No S3 or S4 sounds.  Pulmonary:     Effort: Pulmonary effort is normal. No tachypnea, accessory muscle usage or respiratory distress.     Breath sounds: Normal  breath sounds. No stridor. No decreased breath sounds, wheezing, rhonchi or rales.  Chest:     Chest wall: No tenderness.  Abdominal:     General: Bowel sounds are normal. There is no distension.     Palpations: Abdomen is soft. Abdomen is not rigid.     Tenderness: There is no abdominal tenderness. There is no guarding or rebound.  Musculoskeletal:        General: Normal range of motion.     Cervical back: Normal range of motion and neck supple. No edema, erythema or rigidity. No muscular tenderness. Normal range of motion.  Lymphadenopathy:     Head:     Right side of head: No submental or submandibular adenopathy.     Left side of head: No submental or submandibular adenopathy.     Cervical: No cervical adenopathy.  Skin:    General: Skin is warm and dry.     Coloration: Skin is not pale.     Findings: No rash.     Nails: There is no clubbing.  Neurological:     Mental Status: She is alert and oriented to person, place, and time.     Sensory: No sensory deficit.  Psychiatric:        Speech: Speech normal.        Behavior: Behavior normal.        Significant Diagnostic Studies: Cardiac Studies: - R/LHC (5/23): mild non-obs CAD, EF 60-65%, severe MR by echo, no significant v-waves in PCWP tracing, high cardiac output - may be overestimated by Fick     Ost RCA to Prox RCA lesion is 20% stenosed.   Prox RCA to Mid RCA lesion is 40% stenosed.   Mid LAD lesion is 40% stenosed.   The left ventricular ejection fraction is 55-65% by visual estimate.   Ao = 139/74 LV = 136/12 RA = 9 RV = 46/10 PA = 50/10 (26) PCW = 12 (no significant v waves) Fick cardiac output/index = 12.2/6.8 PVR = 1.8 WU Ao sat = 99% PA sat = 79%, 80% SVC sat = 85%   Plan/Discussion:  CTS consulted for MVR.    - TEE (5/23, bedside): EF 60-65%, RV mildly reduced, small pericardial effusion, severe rheumatic MR, mild AI   - Echo (5/23): EF 65-70%, moderate LVH, grade III DD, moderate TR, severe  MR   - Echo (12/22): EF 70-75%, severe MR Imaging Results  DG Abd 1 View   Result Date: 02/17/2022 CLINICAL DATA:  Ileus. EXAM: ABDOMEN - 1 VIEW COMPARISON:  Radiograph 01/12/2022 FINDINGS: Feeding 2 with tip in the proximal duodenum. Increasing gaseous distention of the colon. Transverse colon measures 6 cm which is within normal limits. There is gas in the rectum. IMPRESSION: 1. Feeding tube in first portion duodenum. 2. Gas distended colon without evidence obstruction. Electronically Signed   By: Genevive Bi M.D.   On: 02/17/2022 14:25    DG Abd 1 View   Result Date: 02/15/2022 CLINICAL DATA:  Ileus EXAM: ABDOMEN - 1 VIEW COMPARISON:  Portable exam 0820 hours compared to 02/08/2022 FINDINGS: Nonobstructive bowel gas pattern. No bowel dilatation or bowel wall thickening. Improved atelectasis versus consolidation LEFT lower lobe. No urinary tract calcification. Levoconvex thoracolumbar scoliosis. IMPRESSION: No acute abnormalities. Electronically Signed   By: Lavonia Dana M.D.   On: 02/15/2022 08:27    CT HEAD WO CONTRAST (5MM)   Result Date: 02/15/2022 CLINICAL DATA:  Altered mental status. EXAM: CT HEAD WITHOUT CONTRAST TECHNIQUE: Contiguous axial images were obtained from the base of the skull through the vertex without intravenous contrast. RADIATION DOSE REDUCTION: This exam was performed according to the departmental dose-optimization program which includes automated exposure control, adjustment of the mA and/or kV according to patient size and/or use of iterative reconstruction technique. COMPARISON:  None Available. FINDINGS: Brain: No evidence of acute infarction, hemorrhage, hydrocephalus, extra-axial collection or mass lesion/mass effect. Focal area of low density within the left cerebellar hemisphere is identified, image 7/3. This is suspicious for subacute to chronic infarct. Vascular: No hyperdense vessel or unexpected calcification. Skull: Normal. Negative for fracture or focal  lesion. Sinuses/Orbits: No acute finding. Other: None. IMPRESSION: 1. No acute intracranial abnormalities. 2. Focal area of low density within the left cerebellar hemisphere is suspicious for subacute to chronic infarct. Electronically Signed   By: Kerby Moors M.D.   On: 02/15/2022 13:20    CARDIAC CATHETERIZATION   Result Date: 02/10/2022   Ost RCA to Prox RCA lesion is 20% stenosed.   Prox RCA to Mid RCA lesion is 40% stenosed.   Mid LAD lesion is 40% stenosed.   The left ventricular ejection fraction is 55-65% by visual estimate. Findings: Ao = 139/74 LV = 136/12 RA = 9 RV = 46/10 PA = 50/10 (26) PCW = 12 (no significant v waves) Fick cardiac output/index = 12.2/6.8 PVR = 1.8 WU Ao sat = 99% PA sat = 79%, 80% SVC sat = 85% Assessment: 1. Mild non-obstructive CAD 2. EF 60-65% 3. Severe MR by echo. No significant v-waves in PCWP tracing 4. High cardiac output - may be overestimated by Fick Plan/Discussion: Will plan to wean vent. TCTS consulted for MVR. Glori Bickers, MD 4:27 PM   DG CHEST PORT 1 VIEW   Result Date: 02/13/2022 CLINICAL DATA:  Endotracheal tube placement. EXAM: PORTABLE CHEST 1 VIEW COMPARISON:  Feb 12, 2022. FINDINGS: Stable cardiomegaly. Endotracheal and nasogastric tubes are in grossly good position. Right internal jugular catheter is in good position. Bibasilar atelectasis or edema is noted with associated pleural effusions. Bony thorax is unremarkable. IMPRESSION: Stable support apparatus. Stable bibasilar atelectasis or edema is noted with associated pleural effusions. Electronically Signed   By: Marijo Conception M.D.   On: 02/13/2022 09:23    DG CHEST PORT 1 VIEW   Result Date: 02/12/2022 CLINICAL DATA:  ETT assessment EXAM: PORTABLE CHEST 1 VIEW COMPARISON:  Feb 11, 2022 FINDINGS: The ETT terminates 3 cm above the carina. The right central line terminates in the central SVC. The NG tube terminates below today's film. No pneumothorax. Bilateral effusions, right greater than  left. Atelectasis underlying the effusions. Mild interstitial prominence without overt edema. No change in the cardiomediastinal silhouette which is partially obscured by transcutaneous pacer leads. IMPRESSION: 1. Support apparatus as above. 2. Right greater than left pleural effusions with underlying atelectasis. 3. Mild pulmonary venous congestion. Electronically Signed   By: Dorise Bullion III M.D.   On: 02/12/2022 08:20    DG CHEST PORT 1 VIEW   Result Date: 02/11/2022 CLINICAL DATA:  56 year old female intubated. Cardiogenic shock. EXAM: PORTABLE CHEST 1 VIEW COMPARISON:  Portable chest 02/08/2022 and earlier. FINDINGS: Portable AP semi upright view at 0734 hours. Stable right IJ central line. ETT tip in good position between the clavicles and carina. Enteric tube courses to the abdomen, tip not included. Pacer or resuscitation pads now project over the chest. Ongoing cardiomegaly, cardiac contour raising the possibility of pericardial effusion. Other mediastinal contours are within normal limits. No pneumothorax. Regressed but not resolved diffuse pulmonary interstitial opacity, apical pulmonary vascularity now appears normal. Continued veiling and confluent opacity at both lung bases. IMPRESSION: 1. Satisfactory visible lines and tubes. 2. Cardiomegaly, consider pericardial effusion. 3. Regressed but not resolved pulmonary interstitial opacity, favor regressed pulmonary edema. 4. Continued bibasilar opacity which probably reflects combination of pleural effusions and lower lobe collapse or consolidation. Electronically Signed   By: Genevie Ann M.D.   On: 02/11/2022 08:00    DG Chest Port 1 View   Result Date: 02/08/2022 CLINICAL DATA:  Nasogastric tube placement and central line placement EXAM: PORTABLE CHEST 1 VIEW COMPARISON:  Earlier today at 2:41 p.m. FINDINGS: 3:57 p.m. Endotracheal tube terminates 2.2 cm above carina. Nasogastric tube extends beyond the inferior aspect of the film. Right internal  jugular line tip is favored to terminate at the high right atrium or cavoatrial junction. Numerous leads and wires project over the chest. Patient is rotated left. Mild cardiomegaly. Probable small bilateral pleural effusions. No pneumothorax. Interstitial and airspace disease is symmetric, lower lung predominant, and progressive. IMPRESSION: Mild position degradation. Right internal jugular line tip is favored to terminate at the high right atrium or cavoatrial junction. No pneumothorax. Nasogastric tube extends beyond the inferior aspect of the film. Progressive interstitial and airspace disease, likely pulmonary edema. Electronically Signed   By: Abigail Miyamoto M.D.   On: 02/08/2022 16:17    DG CHEST PORT 1 VIEW   Result Date: 02/08/2022 CLINICAL DATA:  Shortness of breath EXAM: PORTABLE CHEST 1 VIEW COMPARISON:  02/07/2022 FINDINGS: Endotracheal tube is 3 cm above the carina. Right central line tip at the cavoatrial junction. No pneumothorax. Cardiomegaly. Diffuse bilateral interstitial and airspace opacities. This could reflect edema or infection. No visible effusions or pneumothorax. No acute bony abnormality. IMPRESSION: Cardiomegaly. Diffuse bilateral interstitial and alveolar opacities could reflect edema or infection. Electronically Signed   By: Rolm Baptise M.D.   On: 02/08/2022 15:12    DG Chest Port 1 View   Result Date: 02/07/2022 CLINICAL DATA:  Tachycardia. EXAM: PORTABLE CHEST 1 VIEW COMPARISON:  Chest x-ray 07/02/2021. FINDINGS: Heart is enlarged, unchanged. There are small bilateral pleural effusions. There is some strandy opacities in the lung bases. No pneumothorax or acute fracture. IMPRESSION: 1. Cardiomegaly with small pleural effusions and minimal bibasilar atelectasis/edema. Electronically Signed   By: Ronney Asters M.D.   On: 02/07/2022 19:45    DG Abd Portable 1V   Result Date: 02/15/2022 CLINICAL DATA:  Feeding tube placement EXAM: PORTABLE ABDOMEN - 1 VIEW COMPARISON:   02/16/2020 FINDINGS: Feeding tube enters the stomach with the tip near the gastric antrum. Normal bowel gas pattern Cardiac enlargement. IMPRESSION: Feeding tube tip gastric antrum. Electronically Signed   By: Franchot Gallo M.D.   On: 02/15/2022 13:17    DG Abd Portable 1V   Result Date: 02/08/2022 CLINICAL DATA:  NG tube placement EXAM: PORTABLE ABDOMEN - 1 VIEW COMPARISON:  06/18/2006 FINDINGS: Tip of enteric tube is seen in the antrum of the stomach. Bowel gas pattern is nonspecific.  Pelvis is not included. Haziness in the lower lung fields suggest bilateral pleural effusions. Increased markings in the lower lung fields are noted, more so on the right side suggesting asymmetric pulmonary edema or multifocal pneumonia. IMPRESSION: Tip of enteric tube is seen in the antrum of the stomach. Electronically Signed   By: Elmer Picker M.D.   On: 02/08/2022 16:15    ECHOCARDIOGRAM COMPLETE   Result Date: 02/08/2022    ECHOCARDIOGRAM REPORT   Patient Name:   EVELIA TOLLIS [facility] Date of Exam: 02/08/2022 Medical Rec #:  [id number]       Height:       67.0 in Accession #:    [id number]      Weight:       150.2 lb Date of Birth:  Aug 16, 1966        BSA:          1.791 m Patient Age:    49 years        BP:           124/91 mmHg Patient Gender: F               HR:           69 bpm. Exam Location:  Inpatient Procedure: 2D Echo, Cardiac Doppler and Color Doppler                           STAT ECHO Reported to: Dr Buford Dresser on 02/08/2022 5:30:00 PM. Indications:    Mitral valve insufficiency  History:        Patient has prior history of Echocardiogram examinations, most                 recent 08/25/2021. CHF, Mitral Valve Disease; Arrythmias:Atrial                 Flutter.  Sonographer:    Clayton Lefort RDCS (AE) Referring Phys: [id number] [place]  Sonographer Comments: Echo performed with patient supine and on artificial respirator. IMPRESSIONS  1. Left ventricular ejection fraction, by estimation, is 65  to 70%. The left ventricle has normal function. The left ventricle has no regional wall motion abnormalities. There is moderate concentric left ventricular hypertrophy. Left ventricular diastolic parameters are consistent with Grade III diastolic dysfunction (restrictive).  2. Right ventricular systolic function is mildly reduced. The right ventricular size is normal. There is mildly elevated pulmonary artery systolic pressure.  3. Left atrial size was severely dilated.  4. Right atrial size was mildly dilated.  5. A small pericardial effusion is present.  6. 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 Trivial mitral stenosis.  7. Tricuspid valve regurgitation is moderate.  8. The aortic valve is tricuspid. There is mild calcification of the aortic valve. Aortic valve regurgitation is mild.  9. The inferior vena cava is dilated in size with <50% respiratory variability, suggesting right atrial pressure of 15 mmHg. Comparison(s): Prior images reviewed side by side. Conclusion(s)/Recommendation(s): No significant changes from prior--discussed with Dr. Gardiner Rhyme. Severe MR. Valves better seen on prior imaging. FINDINGS  Left Ventricle: Left ventricular ejection fraction, by estimation, is 65 to 70%. The left ventricle has normal function. The left ventricle has no regional wall motion abnormalities. The left ventricular internal cavity size was normal in size. There is  moderate concentric left ventricular hypertrophy. Left ventricular diastolic parameters are consistent with Grade III diastolic dysfunction (restrictive). Right Ventricle: The right ventricular size is normal.  Right vetricular wall thickness was not well visualized. Right ventricular systolic function is mildly reduced. There is mildly elevated pulmonary artery systolic pressure. The tricuspid regurgitant velocity is 2.69 m/s, and with an assumed right atrial pressure of 15 mmHg, the estimated right ventricular systolic pressure is  [id number] mmHg. Left Atrium: Left atrial size was severely dilated. Right Atrium: Right atrial size was mildly dilated. Pericardium: A small pericardial effusion is present. Mitral Valve: The mitral valve is rheumatic. There is severe thickening of the mitral valve leaflet(s). Moderately decreased mobility of the mitral valve leaflets. Severe mitral valve regurgitation, with eccentric posteriorly directed jet. Trivial mitral  valve stenosis. MV peak gradient, 4.5 mmHg. The mean mitral valve gradient is 1.0 mmHg. Tricuspid Valve: The tricuspid valve is not well visualized. Tricuspid valve regurgitation is moderate. Aortic Valve: The aortic valve is tricuspid. There is mild calcification of the aortic valve. Aortic valve regurgitation is mild. Aortic valve mean gradient measures 4.0 mmHg. Aortic valve peak gradient measures 7.8 mmHg. Aortic valve area, by VTI measures 2.10 cm. Pulmonic Valve: The pulmonic valve was not well visualized. Pulmonic valve regurgitation is trivial. No evidence of pulmonic stenosis. Aorta: The aortic arch was not well visualized and the aortic root and ascending aorta are structurally normal, with no evidence of dilitation. Venous: The inferior vena cava is dilated in size with less than 50% respiratory variability, suggesting right atrial pressure of 15 mmHg. IAS/Shunts: The atrial septum is grossly normal.  LEFT VENTRICLE PLAX 2D LVIDd:         3.60 cm     Diastology LVIDs:         2.20 cm     LV e' medial:    5.25 cm/s LV PW:         1.60 cm     LV E/e' medial:  20.0 LV IVS:        1.70 cm     LV e' lateral:   9.31 cm/s LVOT diam:     1.90 cm     LV E/e' lateral: 11.3 LV SV:         48 LV SV Index:   27 LVOT Area:     2.84 cm  LV Volumes (MOD) LV vol d, MOD A2C: 95.1 ml LV vol d, MOD A4C: 90.8 ml LV vol s, MOD A2C: 41.9 ml LV vol s, MOD A4C: 44.1 ml LV SV MOD A2C:     53.2 ml LV SV MOD A4C:     90.8 ml LV SV MOD BP:      49.0 ml RIGHT VENTRICLE RV Basal diam:  3.30 cm RV S prime:     6.87  cm/s TAPSE (M-mode): 1.2 cm LEFT ATRIUM              Index        RIGHT ATRIUM           Index LA diam:        2.80 cm  1.56 cm/m   RA Area:     19.00 cm LA Vol (A2C):   171.0 ml 95.50 ml/m  RA Volume:   49.00 ml  27.37 ml/m LA Vol (A4C):   119.0 ml 66.46 ml/m LA Biplane Vol: 153.0 ml 85.45 ml/m  AORTIC VALVE AV Area (Vmax):    2.15 cm AV Area (Vmean):   2.05 cm AV Area (VTI):     2.10 cm AV Vmax:           140.00 cm/s  AV Vmean:          89.000 cm/s AV VTI:            0.228 m AV Peak Grad:      7.8 mmHg AV Mean Grad:      4.0 mmHg LVOT Vmax:         106.00 cm/s LVOT Vmean:        64.500 cm/s LVOT VTI:          0.169 m LVOT/AV VTI ratio: 0.74  AORTA Ao Root diam: 2.90 cm Ao Asc diam:  3.40 cm MITRAL VALVE                  TRICUSPID VALVE MV Area (PHT): 1.55 cm       TR Peak grad:   28.9 mmHg MV Area VTI:   1.36 cm       TR Vmax:        269.00 cm/s MV Peak grad:  4.5 mmHg MV Mean grad:  1.0 mmHg       SHUNTS MV Vmax:       1.06 m/s       Systemic VTI:  0.17 m MV Vmean:      50.6 cm/s      Systemic Diam: 1.90 cm MV Decel Time: 488 msec MR Peak grad:    100.8 mmHg MR Mean grad:    65.0 mmHg MR Vmax:         502.00 cm/s MR Vmean:        378.0 cm/s MR PISA:         6.28 cm MR PISA Eff ROA: 48 mm MR PISA Radius:  1.00 cm MV E velocity: 105.00 cm/s MV A velocity: 28.30 cm/s MV E/A ratio:  3.71 Buford Dresser MD Electronically signed by Buford Dresser MD Signature Date/Time: 02/08/2022/5:55:39 PM    Final     ECHO TEE   Result Date: 02/21/2022    TRANSESOPHOGEAL ECHO REPORT   Patient Name:   JAKAYLA SHAMES [facility] Date of Exam: 02/09/2022 Medical Rec #:  [id number]       Height:       67.0 in Accession #:    [id number]      Weight:       148.6 lb Date of Birth:  01-24-1966        BSA:          1.782 m Patient Age:    62 years        BP:           140/73 mmHg Patient Gender: F               HR:           67 bpm. Exam Location:  Inpatient Procedure: 2D Echo, Color Doppler and Cardiac Doppler Indications:      Mitral Regurgitation  History:         Patient has prior history of Echocardiogram examinations, most                  recent 02/08/2022. Arrythmias:Atrial Flutter.  Sonographer:     Raquel Sarna Senior RDCS Referring Phys:  [phone] AMY D CLEGG Diagnosing Phys: Glori Bickers MD PROCEDURE: After discussion of the risks and benefits of a TEE, an informed consent was obtained from the patient. The transesophogeal probe was passed without difficulty through the esophogus of the patient. Sedation performed by performing physician. The patient developed no complications during the procedure. IMPRESSIONS  1. Left ventricular ejection fraction, by estimation, is 60 to 65%. The left ventricle has normal function. The left ventricle has no regional wall motion abnormalities.  2. Right ventricular systolic function is mildly reduced. The right ventricular size is normal.  3. Left atrial size was severely dilated. No left atrial/left atrial appendage thrombus was detected.  4. A small pericardial effusion is present. The pericardial effusion is localized near the right atrium and anterior to the right ventricle.  5. 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  6. The aortic valve is tricuspid. Aortic valve regurgitation is mild.  7. There is mild (Grade II) plaque involving the descending aorta. FINDINGS  Left Ventricle: Left ventricular ejection fraction, by estimation, is 60 to 65%. The left ventricle has normal function. The left ventricle has no regional wall motion abnormalities. The left ventricular internal cavity size was normal in size. Right Ventricle: The right ventricular size is normal. No increase in right ventricular wall thickness. Right ventricular systolic function is mildly reduced. Left Atrium: Left atrial size was severely dilated. No left atrial/left atrial appendage thrombus was detected. Right Atrium: Right atrial size was normal in size. Pericardium: A small pericardial effusion is  present. The pericardial effusion is localized near the right atrium and anterior to the right ventricle. Mitral Valve: The mitral valve is rheumatic. There is moderate thickening of the mitral valve leaflet(s). Moderately decreased mobility of the mitral valve leaflets. Severe mitral valve regurgitation, with centrally-directed jet. Tricuspid Valve: The tricuspid valve is normal in structure. Tricuspid valve regurgitation is mild. Aortic Valve: The aortic valve is tricuspid. Aortic valve regurgitation is mild. Pulmonic Valve: The pulmonic valve was grossly normal. Pulmonic valve regurgitation is not visualized. Aorta: The aortic root is normal in size and structure. There is mild (Grade II) plaque involving the descending aorta. IAS/Shunts: No atrial level shunt detected by color flow Doppler.  MR Peak grad:    219.0 mmHg MR Mean grad:    103.0 mmHg MR Vmax:         740.00 cm/s MR Vmean:        455.0 cm/s MR PISA:         3.08 cm MR PISA Eff ROA: 16 mm MR PISA Radius:  0.70 cm Glori Bickers MD Electronically signed by Glori Bickers MD Signature Date/Time: 02/21/2022/10:34:17 AM    Final     VAS US DOPPLER PRE CABG   Result Date: 02/15/2022 PREOPERATIVE VASCULAR EVALUATION Patient Name:  KACEE GOOSTREE [facility]  Date of Exam:   02/15/2022 Medical Rec #: [id number]        Accession #:    [id number] Date of Birth: November 15, 1965         Patient Gender: F Patient Age:   62 years Exam Location[facility]  [facility] Procedure:      VAS US DOPPLER PRE CABG Referring Phys: Remo Lipps HENDRICKSON --------------------------------------------------------------------------------  Indications:      Pre-CABG. Other Factors:    MV disease. Limitations:      Right bandaging/line Comparison Study: No prior study Performing Technologist: Maudry Mayhew MHA, RDMS, RVT, RDCS  Examination Guidelines: A complete evaluation includes B-mode imaging, spectral Doppler, color Doppler, and power Doppler as needed of all accessible portions of each  vessel. Bilateral testing is considered an integral part of a complete examination. Limited examinations for reoccurring indications may be performed as noted.  Right Carotid Findings: +----------+--------+--------+--------+--------+--------+           PSV cm/sEDV cm/sStenosisDescribeComments +----------+--------+--------+--------+--------+--------+ CCA Prox  57      13                               +----------+--------+--------+--------+--------+--------+ ICA Prox  62      11                               +----------+--------+--------+--------+--------+--------+ ICA Distal63      16                               +----------+--------+--------+--------+--------+--------+ +----------+--------+-------+----------------+------------+           PSV cm/sEDV cmsDescribe        Arm Pressure +----------+--------+-------+----------------+------------+ Subclavian120            Multiphasic, WNL             +----------+--------+-------+----------------+------------+ +---------+--------+---+--------+--+---------+ VertebralPSV cm/s103EDV cm/s17Antegrade +---------+--------+---+--------+--+---------+ Left Carotid Findings: +----------+--------+-------+--------+----------------------+------------------+           PSV cm/sEDV    StenosisDescribe              Comments                             cm/s                                                    +----------+--------+-------+--------+----------------------+------------------+ CCA Prox  117     16                                                      +----------+--------+-------+--------+----------------------+------------------+ CCA Distal81      22                                   intimal thickening +----------+--------+-------+--------+----------------------+------------------+ ICA Prox  47      12             heterogenous and                                                          smooth                                    +----------+--------+-------+--------+----------------------+------------------+ ICA Distal62      24                                                      +----------+--------+-------+--------+----------------------+------------------+ ECA       55      8                                                       +----------+--------+-------+--------+----------------------+------------------+ +----------+--------+--------+----------------+------------+  SubclavianPSV cm/sEDV cm/sDescribe        Arm Pressure +----------+--------+--------+----------------+------------+           163             Multiphasic, WNL             +----------+--------+--------+----------------+------------+ +---------+--------+--+--------+--+---------+ VertebralPSV cm/s53EDV cm/s19Antegrade +---------+--------+--+--------+--+---------+  ABI Findings: +--------+------------------+-----+---------+--------+ Right   Rt Pressure (mmHg)IndexWaveform Comment  +--------+------------------+-----+---------+--------+ Brachial                       triphasic         +--------+------------------+-----+---------+--------+ +--------+------------------+-----+---------+-------+ Left    Lt Pressure (mmHg)IndexWaveform Comment +--------+------------------+-----+---------+-------+ [id number]                    triphasic        +--------+------------------+-----+---------+-------+  Right Doppler Findings: +-----------+--------+-----+---------+-----------------------------------------+ Site       PressureIndexDoppler  Comments                                  +-----------+--------+-----+---------+-----------------------------------------+ Brachial                triphasic                                          +-----------+--------+-----+---------+-----------------------------------------+ Radial                  triphasic                                           +-----------+--------+-----+---------+-----------------------------------------+ Ulnar                   triphasic                                          +-----------+--------+-----+---------+-----------------------------------------+ Palmar Arch                      Signal obliterates with radial                                             compression, is unaffected with ulnar                                      comperssion                               +-----------+--------+-----+---------+-----------------------------------------+  Left Doppler Findings: +-----------+--------+-----+---------+--------------------+ Site       PressureIndexDoppler  Comments             +-----------+--------+-----+---------+--------------------+ Brachial   125          triphasic                     +-----------+--------+-----+---------+--------------------+ Radial                  triphasic                     +-----------+--------+-----+---------+--------------------+  Ulnar                   triphasic                     +-----------+--------+-----+---------+--------------------+ Palmar Arch                      Within normal limits +-----------+--------+-----+---------+--------------------+  Summary: Right Carotid: Velocities in the right ICA are consistent with a 1-39% stenosis.                Limited visualization secondary to bandaging. Left Carotid: Velocities in the left ICA are consistent with a 1-39% stenosis. Vertebrals:  Bilateral vertebral arteries demonstrate antegrade flow. Subclavians: Normal flow hemodynamics were seen in bilateral subclavian              arteries.  Electronically signed by Harold Barban MD on 02/15/2022 at 8:52:49 PM.    Final     VAS [place] UPPER EXTREMITY VENOUS DUPLEX   Result Date: 02/15/2022 UPPER VENOUS STUDY  Patient Name:  MAKIYAH HAUVER [facility]  Date of Exam:   02/15/2022 Medical Rec #: [id number]        Accession #:    [id number] Date of Birth:  1966-02-19         Patient Gender: F Patient Age:   2 years Exam Location:  [facility] Procedure:      VAS [place] UPPER EXTREMITY VENOUS DUPLEX Referring Phys: Ina Homes --------------------------------------------------------------------------------  Indications: Edema Comparison Study: No prior study Performing Technologist: Maudry Mayhew MHA, RDMS, RVT, RDCS  Examination Guidelines: A complete evaluation includes B-mode imaging, spectral Doppler, color Doppler, and power Doppler as needed of all accessible portions of each vessel. Bilateral testing is considered an integral part of a complete examination. Limited examinations for reoccurring indications may be performed as noted.  Right Findings: +----------+------------+---------+-----------+----------+------------+ RIGHT     CompressiblePhasicitySpontaneousProperties  Summary    +----------+------------+---------+-----------+----------+------------+ IJV                                Yes              line present +----------+------------+---------+-----------+----------+------------+ Subclavian    Full       Yes       Yes                           +----------+------------+---------+-----------+----------+------------+ Axillary      Full       Yes       Yes                           +----------+------------+---------+-----------+----------+------------+ Brachial      Full       Yes       Yes                           +----------+------------+---------+-----------+----------+------------+ Radial        Full                                               +----------+------------+---------+-----------+----------+------------+ Ulnar         Full                                               +----------+------------+---------+-----------+----------+------------+  Cephalic      Full                                               +----------+------------+---------+-----------+----------+------------+  Basilic       None                                     Acute     +----------+------------+---------+-----------+----------+------------+  Left Findings: +----------+------------+---------+-----------+----------+-------+ LEFT      CompressiblePhasicitySpontaneousPropertiesSummary +----------+------------+---------+-----------+----------+-------+ Subclavian               Yes       Yes                      +----------+------------+---------+-----------+----------+-------+  Summary:  Right: No evidence of deep vein thrombosis in the upper extremity. Findings consistent with acute superficial vein thrombosis involving the right basilic vein.  Left: No evidence of thrombosis in the subclavian.  *See table(s) above for measurements and observations.  Diagnosing physician: Harold Barban MD Electronically signed by Harold Barban MD on 02/15/2022 at 8:52:39 PM.    Final     US Abdomen Limited RUQ (LIVER/GB)   Result Date: 02/17/2022 CLINICAL DATA:  Cholecystitis. EXAM: ULTRASOUND ABDOMEN LIMITED RIGHT UPPER QUADRANT COMPARISON:  None Available. FINDINGS: Gallbladder: No gallstones or wall thickening visualized. No sonographic Murphy sign noted by sonographer. Common bile duct: Diameter: 6 mm. Upper limits of normal. No intrahepatic bile duct dilatation. Liver: No focal lesion identified. Within normal limits in parenchymal echogenicity. Portal vein is patent on color Doppler imaging with normal direction of blood flow towards the liver. Other: None. IMPRESSION: 1. No acute findings. No evidence for cholecystitis. 2. Common bile duct is upper limits of normal measuring 6 mm. Electronically Signed   By: Kerby Moors M.D.   On: 02/17/2022 15:13     Assessment & Plan:   Problem List Items Addressed This Visit   None We will obtain colonoscopy and mammogram and Pap smear and give tetanus vaccine after planned surgical interventions  No follow-ups on file.   Asencion Noble, MD

## 2022-05-23 ENCOUNTER — Encounter: Payer: Self-pay | Admitting: Critical Care Medicine

## 2022-05-23 ENCOUNTER — Other Ambulatory Visit (HOSPITAL_COMMUNITY): Payer: Self-pay

## 2022-05-23 ENCOUNTER — Ambulatory Visit: Payer: Medicaid Other | Attending: Critical Care Medicine | Admitting: Critical Care Medicine

## 2022-05-23 VITALS — BP 115/77 | HR 73 | Ht 67.0 in | Wt 148.8 lb

## 2022-05-23 DIAGNOSIS — Z8679 Personal history of other diseases of the circulatory system: Secondary | ICD-10-CM | POA: Diagnosis not present

## 2022-05-23 DIAGNOSIS — D62 Acute posthemorrhagic anemia: Secondary | ICD-10-CM | POA: Diagnosis not present

## 2022-05-23 DIAGNOSIS — I5032 Chronic diastolic (congestive) heart failure: Secondary | ICD-10-CM | POA: Diagnosis not present

## 2022-05-23 DIAGNOSIS — Z7901 Long term (current) use of anticoagulants: Secondary | ICD-10-CM

## 2022-05-23 DIAGNOSIS — Z9889 Other specified postprocedural states: Secondary | ICD-10-CM | POA: Insufficient documentation

## 2022-05-23 DIAGNOSIS — I061 Rheumatic aortic insufficiency: Secondary | ICD-10-CM

## 2022-05-23 DIAGNOSIS — G8918 Other acute postprocedural pain: Secondary | ICD-10-CM

## 2022-05-23 DIAGNOSIS — K08109 Complete loss of teeth, unspecified cause, unspecified class: Secondary | ICD-10-CM

## 2022-05-23 MED ORDER — PANTOPRAZOLE SODIUM 40 MG PO TBEC
40.0000 mg | DELAYED_RELEASE_TABLET | Freq: Every day | ORAL | 3 refills | Status: DC
  Filled 2022-05-23 – 2022-06-13 (×2): qty 30, 30d supply, fill #0
  Filled 2022-07-12: qty 30, 30d supply, fill #1
  Filled 2022-08-08: qty 30, 30d supply, fill #2
  Filled 2022-09-12: qty 30, 30d supply, fill #3

## 2022-05-23 MED ORDER — QUETIAPINE FUMARATE 25 MG PO TABS
25.0000 mg | ORAL_TABLET | Freq: Every evening | ORAL | 3 refills | Status: DC | PRN
  Filled 2022-05-23 – 2022-06-13 (×2): qty 30, 30d supply, fill #0
  Filled 2022-07-12: qty 30, 30d supply, fill #1

## 2022-05-23 MED ORDER — WARFARIN SODIUM 1 MG PO TABS: ORAL_TABLET | ORAL | 3 refills | Status: DC

## 2022-05-23 NOTE — Assessment & Plan Note (Signed)
Follow-up CBC ?

## 2022-05-23 NOTE — Assessment & Plan Note (Signed)
Now on warfarin we will increase dose to 3 mg a day recheck on INR in 1 week

## 2022-05-23 NOTE — Assessment & Plan Note (Signed)
Compensated diastolic heart failure we will get patient back into the cardiology clinic

## 2022-05-23 NOTE — Assessment & Plan Note (Signed)
Pain syndrome improved she is now off opiates

## 2022-05-23 NOTE — Assessment & Plan Note (Signed)
Patient now edentulous after having had multiple teeth removed and will have be fitted for dentures once cleared by cardiothoracic surgery

## 2022-05-23 NOTE — Assessment & Plan Note (Signed)
History of mitral rheumatic regurgitation resolved with mitral valve replacement

## 2022-05-23 NOTE — Assessment & Plan Note (Signed)
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

## 2022-05-23 NOTE — Patient Instructions (Addendum)
No change in medications refill sent to [facility] outpatient except warfarin is now 3mg  daily  Labs today include metabolic panel and blood count  We obtained a fingerstick pro time INR on you today it was 1.6  you need to INCREASE Coumadin to 3 tablets daily and return in one week for PT INR with  Talk to heart surgeon tomorrow about timing for outpatient physical therapy so we can make referrals  We gave you a list of dental resources that take Medicaid  Return to Dr. Franky Macho 4 months  Return also to Dr. Delford Field clinic we will ensure you get another follow-up appointment

## 2022-05-23 NOTE — Assessment & Plan Note (Signed)
Status post maze operation for atrial fibrillation appears to been successful has follow-up with cardiothoracic surgery tomorrow and will get follow-up with cardiology scheduled

## 2022-05-23 NOTE — Assessment & Plan Note (Signed)
Appears to not be severe enough to be replaced will have follow-up with cardiology

## 2022-05-24 ENCOUNTER — Other Ambulatory Visit: Payer: Self-pay | Admitting: Critical Care Medicine

## 2022-05-24 DIAGNOSIS — E875 Hyperkalemia: Secondary | ICD-10-CM | POA: Insufficient documentation

## 2022-05-24 DIAGNOSIS — Z7901 Long term (current) use of anticoagulants: Secondary | ICD-10-CM | POA: Insufficient documentation

## 2022-05-24 LAB — CBC WITH DIFFERENTIAL/PLATELET
Basophils Absolute: 0.1 10*3/uL (ref 0.0–0.2)
Basos: 1 %
EOS (ABSOLUTE): 0.2 10*3/uL (ref 0.0–0.4)
Eos: 3 %
Hematocrit: 32.5 % — ABNORMAL LOW (ref 34.0–46.6)
Hemoglobin: 10 g/dL — ABNORMAL LOW (ref 11.1–15.9)
Immature Grans (Abs): 0 10*3/uL (ref 0.0–0.1)
Immature Granulocytes: 0 %
Lymphocytes Absolute: 1.8 10*3/uL (ref 0.7–3.1)
Lymphs: 24 %
MCH: 26.2 pg — ABNORMAL LOW (ref 26.6–33.0)
MCHC: 30.8 g/dL — ABNORMAL LOW (ref 31.5–35.7)
MCV: 85 fL (ref 79–97)
Monocytes Absolute: 0.7 10*3/uL (ref 0.1–0.9)
Monocytes: 10 %
Neutrophils Absolute: 4.6 10*3/uL (ref 1.4–7.0)
Neutrophils: 62 %
Platelets: 775 10*3/uL — ABNORMAL HIGH (ref 150–450)
RBC: 3.81 x10E6/uL (ref 3.77–5.28)
RDW: 15.1 % (ref 11.7–15.4)
WBC: 7.3 10*3/uL (ref 3.4–10.8)

## 2022-05-24 LAB — COMPREHENSIVE METABOLIC PANEL
ALT: 14 IU/L (ref 0–32)
AST: 18 IU/L (ref 0–40)
Albumin/Globulin Ratio: 1 — ABNORMAL LOW (ref 1.2–2.2)
Albumin: 3.9 g/dL (ref 3.8–4.9)
Alkaline Phosphatase: 192 IU/L — ABNORMAL HIGH (ref 44–121)
BUN/Creatinine Ratio: 16 (ref 9–23)
BUN: 23 mg/dL (ref 6–24)
Bilirubin Total: 0.2 mg/dL (ref 0.0–1.2)
CO2: 24 mmol/L (ref 20–29)
Calcium: 9.6 mg/dL (ref 8.7–10.2)
Chloride: 102 mmol/L (ref 96–106)
Creatinine, Ser: 1.46 mg/dL — ABNORMAL HIGH (ref 0.57–1.00)
Globulin, Total: 3.8 g/dL (ref 1.5–4.5)
Glucose: 92 mg/dL (ref 70–99)
Potassium: 6.1 mmol/L — ABNORMAL HIGH (ref 3.5–5.2)
Sodium: 141 mmol/L (ref 134–144)
Total Protein: 7.7 g/dL (ref 6.0–8.5)
eGFR: 42 mL/min/{1.73_m2} — ABNORMAL LOW (ref >59)

## 2022-05-24 NOTE — Progress Notes (Signed)
Pt aware potassium is high , blood counts better  renal function stable. Copied this result note to drs Camnitz and Bensimhon and our CPP luke  bmp future order placed  Told to STOP Klor con potassium supp and STOP aldactone  STAY on entresto   pt told when she comes in for PT INR check with our CPPLuke she is to obtain another BMP in one week.

## 2022-05-26 ENCOUNTER — Other Ambulatory Visit (HOSPITAL_COMMUNITY): Payer: Self-pay

## 2022-05-30 ENCOUNTER — Ambulatory Visit: Payer: Medicaid Other | Attending: Critical Care Medicine | Admitting: Pharmacist

## 2022-05-30 ENCOUNTER — Other Ambulatory Visit: Payer: Self-pay

## 2022-05-30 DIAGNOSIS — Z9889 Other specified postprocedural states: Secondary | ICD-10-CM

## 2022-05-30 LAB — POCT INR: INR: 2 (ref 2.0–3.0)

## 2022-05-30 MED ORDER — WARFARIN SODIUM 4 MG PO TABS
4.0000 mg | ORAL_TABLET | Freq: Every day | ORAL | 2 refills | Status: DC
  Filled 2022-05-30: qty 30, 30d supply, fill #0
  Filled 2022-06-13 – 2022-06-23 (×3): qty 30, 30d supply, fill #1
  Filled 2022-07-25: qty 30, 30d supply, fill #2

## 2022-05-31 ENCOUNTER — Inpatient Hospital Stay: Payer: Self-pay | Admitting: Physician Assistant

## 2022-05-31 LAB — CMP14+EGFR
ALT: 22 IU/L (ref 0–32)
AST: 26 IU/L (ref 0–40)
Albumin/Globulin Ratio: 1.1 — ABNORMAL LOW (ref 1.2–2.2)
Albumin: 3.7 g/dL — ABNORMAL LOW (ref 3.8–4.9)
Alkaline Phosphatase: 175 IU/L — ABNORMAL HIGH (ref 44–121)
BUN/Creatinine Ratio: 14 (ref 9–23)
BUN: 18 mg/dL (ref 6–24)
Bilirubin Total: 0.3 mg/dL (ref 0.0–1.2)
CO2: 22 mmol/L (ref 20–29)
Calcium: 9.3 mg/dL (ref 8.7–10.2)
Chloride: 105 mmol/L (ref 96–106)
Creatinine, Ser: 1.27 mg/dL — ABNORMAL HIGH (ref 0.57–1.00)
Globulin, Total: 3.4 g/dL (ref 1.5–4.5)
Glucose: 93 mg/dL (ref 70–99)
Potassium: 5.2 mmol/L (ref 3.5–5.2)
Sodium: 140 mmol/L (ref 134–144)
Total Protein: 7.1 g/dL (ref 6.0–8.5)
eGFR: 49 mL/min/{1.73_m2} — ABNORMAL LOW (ref >59)

## 2022-05-31 NOTE — Progress Notes (Signed)
Let pt know potassium is back to normal  kidney better , INR back to 2.0 where it should be

## 2022-06-02 ENCOUNTER — Telehealth: Payer: Self-pay

## 2022-06-02 NOTE — Telephone Encounter (Signed)
Pt was called and vm was left, Information has been sent to nurse pool.   

## 2022-06-02 NOTE — Telephone Encounter (Signed)
-----   Message from Storm Frisk, MD sent at 05/31/2022  5:44 AM EDT ----- Let pt know potassium is back to normal  kidney better , INR back to 2.0 where it should be

## 2022-06-06 ENCOUNTER — Ambulatory Visit: Payer: Medicaid Other | Attending: Critical Care Medicine | Admitting: Pharmacist

## 2022-06-06 DIAGNOSIS — Z9889 Other specified postprocedural states: Secondary | ICD-10-CM | POA: Diagnosis not present

## 2022-06-06 LAB — POCT INR: INR: 3.6 — AB (ref 2.0–3.0)

## 2022-06-07 ENCOUNTER — Encounter: Payer: Self-pay | Admitting: Physical Medicine and Rehabilitation

## 2022-06-07 ENCOUNTER — Encounter
Payer: Medicaid Other | Attending: Physical Medicine and Rehabilitation | Admitting: Physical Medicine and Rehabilitation

## 2022-06-07 VITALS — BP 153/97 | HR 61 | Ht 67.0 in | Wt 162.8 lb

## 2022-06-07 DIAGNOSIS — R531 Weakness: Secondary | ICD-10-CM | POA: Diagnosis present

## 2022-06-07 DIAGNOSIS — G8918 Other acute postprocedural pain: Secondary | ICD-10-CM | POA: Insufficient documentation

## 2022-06-07 DIAGNOSIS — Z87898 Personal history of other specified conditions: Secondary | ICD-10-CM | POA: Diagnosis present

## 2022-06-07 DIAGNOSIS — Z9889 Other specified postprocedural states: Secondary | ICD-10-CM | POA: Insufficient documentation

## 2022-06-07 NOTE — Assessment & Plan Note (Addendum)
Likely cardiovascular deconditioning related to recent heart surgery.   PT script written for endurance training once sternal precautions lifted. Maintain cardiac precautions in therapies.  Follow up in 2 months after therapies to re-assess symptoms.

## 2022-06-07 NOTE — Assessment & Plan Note (Signed)
Daily weights consistently taken per patient and without notable increase since surgery. No s/s HF or volume overload on exam.   Patient endorses she is no longer using cocaine.   Follow up with cardiology as scheduled, no concerning signs warranting immediate evaluation.

## 2022-06-07 NOTE — Assessment & Plan Note (Signed)
Patient denies current cocaine use.  Given Hx marijuana use, cautioned against chronic use of inhalants given Hx cardiovascular disease and need to maintain lung health.

## 2022-06-07 NOTE — Progress Notes (Signed)
Subjective:    Patient ID: Lisa Ayala, female    DOB: [birth year], 56 y.o.   MRN: [id number]  HPI  Lisa Ayala is a 56 year old female wiith pmhx PSVT, severe MR, moderate TR and mild to moderate AI, anxiety disorder, regular marijuana use, and hx cocaine use presenting for follow up from IPR admission 02/21/22-02/24/22 s/p hospitalizaiton for CHF d/t mitral, tricuspid, and aortic valve disease pending Maze procedure and atrial appendage clipping at [facility].   At IPR discharge: She she was able to complete ADL tasks at modified independent level.  She is modified independent for transfers and is able to ambulate 1400 feet without rest breaks.  She is able to complete mildly complex for functional tasks at modified independent level.  Daughter will provide supervision and assistance as needed after discharge.  Interval Hx:   Since IPR admission, she states she has had mitral and tricuspid valve replacements and had atrial appendage clipping 05/04/22. Since then, she endorses she has felt "weak" and she has been having aching in her right chest.   R chest pain: Has been taking Tylenol 975 mg 4x daily for pain, with benefit, as well as ice and . She describes the pain as an ache, 6/10, worse at nighttime when laying flat or propped up. She sleeps on her side, and hasn't noticed one side vs the other improving it. It is overall improving, not present 24/7. She is on sternal precautions until September 24th, and has not been offered PT or OT post-op.   Weakness: New since surgery in August, diffuse, associated with fatigue. Denies SOB or CP. She can walk about 5 minutes before becoming fatigued. She uses a walker in the community and is independent in the home. She is staying on the first floor of her home d/t dfficulty with steps, and goes upstairs 2x per week to shower. She does ADLs independently as long as she can sit down. Daughter is staying with her to assist.    Pain Inventory Average Pain  4 Pain Right Now 1 My pain is dull and aching  In the last 24 hours, has pain interfered with the following? General activity 0 Relation with others 0 Enjoyment of life 0 What TIME of day is your pain at its worst? night Sleep (in general) Poor  Pain is worse with: inactivity Pain improves with: heat/ice and medication Relief from Meds: 1  walk without assistance use a cane how many minutes can you walk? 5-10 ability to climb steps?  yes do you drive?  no Do you have any goals in this area?  yes  disabled: date disabled .  No problems in this area  Any changes since last visit?  no  Any changes since last visit?  no    Family History  Family history unknown: Yes   Social History   Socioeconomic History   Marital status: Divorced    Spouse name: Not on file   Number of children: Not on file   Years of education: Not on file   Highest education level: Not on file  Occupational History   Not on file  Tobacco Use   Smoking status: Never   Smokeless tobacco: Never  Vaping Use   Vaping Use: Never used  Substance and Sexual Activity   Alcohol use: Not Currently    Comment: Occasional   Drug use: Not Currently    Types: Marijuana, Cocaine, Other-see comments    Comment: No cocaine since March. Edible  Marijuana for sleep- nightly   Sexual activity: Not Currently  Other Topics Concern   Not on file  Social History Narrative   Lives locally.  Relatively active.   Social Determinants of Health   Financial Resource Strain: High Risk (03/20/2022)   Overall Financial Resource Strain (CARDIA)    Difficulty of Paying Living Expenses: Very hard  Food Insecurity: Food Insecurity Present (03/06/2022)   Hunger Vital Sign    Worried About Running Out of Food in the Last Year: Sometimes true    Ran Out of Food in the Last Year: Sometimes true  Transportation Needs: Unmet Transportation Needs (03/20/2022)   PRAPARE - Administrator, Civil Service (Medical):  Yes    Lack of Transportation (Non-Medical): Yes  Physical Activity: Not on file  Stress: Not on file  Social Connections: Not on file   Past Surgical History:  Procedure Laterality Date   DILATION AND CURETTAGE OF UTERUS     RIGHT/LEFT HEART CATH AND CORONARY ANGIOGRAPHY N/A 02/10/2022   Procedure: RIGHT/LEFT HEART CATH AND CORONARY ANGIOGRAPHY;  Surgeon: Dolores Patty, MD;  Location: [facility] INVASIVE CV LAB;  Service: Cardiovascular;  Laterality: N/A;   TEE WITHOUT CARDIOVERSION N/A 02/08/2022   Procedure: TRANSESOPHAGEAL ECHOCARDIOGRAM (TEE);  Surgeon: Sande Rives, MD;  Location: [facility] ENDOSCOPY;  Service: Cardiovascular;  Laterality: N/A;   TOOTH EXTRACTION N/A 04/27/2022   Procedure: DENTAL RESTORATION/EXTRACTIONS;  Surgeon: Sharman Cheek, DMD;  Location: [facility] OR;  Service: Dentistry;  Laterality: N/A;   Past Medical History:  Diagnosis Date   Aortic insufficiency    Atrial flutter (HCC) 02/07/2022   GERD (gastroesophageal reflux disease)    Hypertension    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 08/2021 Echo: EF 70-75%, no rwma, mod dil LV, GrII DD, nl RV fxn, massively dil LA, mild-mod dil RA, severe MR due to lack of central coaptation of MV leaflets. Mild MS. Mod TR. Mild-mod AI.   Tricuspid regurgitation    Ht 5\' 7"  (1.702 m)   Wt 162 lb 12.8 oz (73.8 kg)   BMI 25.50 kg/m   Opioid Risk Score:   Fall Risk Score:  `1  Depression screen [facility] 2/9     06/07/2022    9:21 AM 04/11/2022   11:26 AM  Depression screen PHQ 2/9  Decreased Interest 0 0  Down, Depressed, Hopeless 0 0  PHQ - 2 Score 0 0  Altered sleeping 0   Tired, decreased energy 0   Change in appetite 0   Feeling bad or failure about yourself  0   Trouble concentrating 0   Moving slowly or fidgety/restless 0   Suicidal thoughts 0   PHQ-9 Score 0   Difficult doing work/chores Not difficult at all        ROS: Denies fevers, chills, N/V, abdominal pain, constipation, diarrhea, SOB, cough, chest pressure, new weakness or paraesthesias.       Objective:   Physical Exam  Constitution: Appropriate appearance for age. No apparent distress. HEENT: PERRL, EOMI grossly intact.  Resp: CTAB. No rales, rhonchi, or wheezing. Cardio: RRR. +mumur, no rubs, or gallops. No peripheral edema. Abdomen: Nondistended. Nontender. +bowel sounds. Psych: Appropriate mood and affect. Neuro: AAOx4. No apparent deficits  Skin: Well healed sternal scar from prior surgical incision. No erythema, fluctuance, or warmth. Multiple ? Surgical scars along upper abdomen.  MSK: 5/5 strength in BL UE and LEs,  limited in UE testing by sternal precautions. Full AROM BL LE. Gait with normal stance and stride.   TTP over right pectoral muscle.      Assessment & Plan:   Lisa Ayala is a 56 year old female wiith pmhx PSVT, severe MR, moderate TR and mild to moderate AI, anxiety disorder, regular marijuana use, and hx cocaine use presenting for follow up from IPR admission 02/21/22-02/24/22, now s/p mitral and tricuspid valve replacements + atrial appendage clipping at [facility] with post-op R chest pain and generalized weakness/fatigue.  Acute post-operative pain Assessment & Plan: Chest pain right sided and reproducible on palpation of pectoralis muscle; likely post-op MSK pain from surgical intervention at the sternum.   OT referral to begin ROM, strengthening after sternal precautions lifted. Maintain cardiac precautions.  Orders: -     Ambulatory referral to Occupational Therapy  Weakness generalized Assessment & Plan: Likely cardiovascular deconditioning related to recent heart surgery.   PT script written for endurance training once sternal precautions lifted. Maintain cardiac precautions in therapies.  Follow up in 2 months after therapies to re-assess symptoms.    Orders: -     Ambulatory referral to Physical  Therapy -     Ambulatory referral to Occupational Therapy  S/P MVR (mitral valve repair) Assessment & Plan: Daily weights consistently taken per patient and without notable increase since surgery. No s/s HF or volume overload on exam.   Patient endorses she is no longer using cocaine.   Follow up with cardiology as scheduled, no concerning signs warranting immediate evaluation.  Orders: -     Ambulatory referral to Physical Therapy -     Ambulatory referral to Occupational Therapy  History of substance use Assessment & Plan: Patient denies current cocaine use.  Given Hx marijuana use, cautioned against chronic use of inhalants given Hx cardiovascular disease and need to maintain lung health.        Gertie Gowda, DO 06/07/2022

## 2022-06-07 NOTE — Assessment & Plan Note (Signed)
Chest pain right sided and reproducible on palpation of pectoralis muscle; likely post-op MSK pain from surgical intervention at the sternum.   OT referral to begin ROM, strengthening after sternal precautions lifted. Maintain cardiac precautions.

## 2022-06-07 NOTE — Patient Instructions (Addendum)
Please use lidocaine/SalonPas patches for musculoskeletal pain over the right chest. These can be obtained over the counter. You are also free to try ben-gay or aspercreme. Do not apply heat or ice over these creams or patches.   Referrals have been sent for PT and OT after 06/20/22.  Follow up in 2 months.

## 2022-06-13 ENCOUNTER — Other Ambulatory Visit (HOSPITAL_COMMUNITY): Payer: Self-pay

## 2022-06-13 ENCOUNTER — Ambulatory Visit: Payer: Medicaid Other | Attending: Critical Care Medicine | Admitting: Pharmacist

## 2022-06-13 ENCOUNTER — Other Ambulatory Visit: Payer: Self-pay | Admitting: Critical Care Medicine

## 2022-06-13 DIAGNOSIS — Z9889 Other specified postprocedural states: Secondary | ICD-10-CM | POA: Diagnosis not present

## 2022-06-13 LAB — POCT INR: INR: 3.1 — AB (ref 2.0–3.0)

## 2022-06-13 MED ORDER — ASPIRIN 81 MG PO CHEW
81.0000 mg | CHEWABLE_TABLET | Freq: Every day | ORAL | 2 refills | Status: DC
  Filled 2022-06-13: qty 30, 30d supply, fill #0
  Filled 2022-07-12 – 2022-07-25 (×2): qty 30, 30d supply, fill #1

## 2022-06-13 MED ORDER — SENNOSIDES-DOCUSATE SODIUM 8.6-50 MG PO TABS
2.0000 | ORAL_TABLET | Freq: Every day | ORAL | 4 refills | Status: DC
  Filled 2022-06-13 – 2022-07-10 (×3): qty 60, 30d supply, fill #0
  Filled 2022-08-08: qty 60, 30d supply, fill #1

## 2022-06-13 MED FILL — Magnesium Oxide Tab 400 MG: ORAL | 30 days supply | Qty: 60 | Fill #0 | Status: CN

## 2022-06-14 ENCOUNTER — Telehealth (HOSPITAL_COMMUNITY): Payer: Self-pay | Admitting: Pharmacy Technician

## 2022-06-14 ENCOUNTER — Other Ambulatory Visit (HOSPITAL_COMMUNITY): Payer: Self-pay

## 2022-06-14 ENCOUNTER — Telehealth (HOSPITAL_COMMUNITY): Payer: Self-pay

## 2022-06-14 ENCOUNTER — Other Ambulatory Visit: Payer: Self-pay

## 2022-06-14 NOTE — Telephone Encounter (Signed)
Advanced Heart Failure Patient Advocate Encounter  Patient called in requesting help with refilling Entresto. The patient was previously uninsured and now has a Engineer, site. Called and spoke with the patient. She is aware that insurance will be used now instead of the hf fund. Will submit PAs for both [place]. Provided phone number to [employer] for her to continue to use until her enrollment period is over.  Of note, the patient's Eliquis has been replaced with Warfarin. Provided the phone number to [employer] so that she can request a cancellation of enrollment.  Charlann Boxer, CPhT

## 2022-06-14 NOTE — Telephone Encounter (Signed)
Advanced Heart Failure Patient Advocate Encounter   Received notification from [facility] that prior authorization is required for Farxiga 10MG  tablets  Submitted: 06/14/2022 Key [id number]   Clista Bernhardt, CPhT Rx Patient Advocate Phone: [phone]

## 2022-06-14 NOTE — Telephone Encounter (Signed)
Advanced Heart Failure Patient Advocate Encounter   Received notification from [facility] that prior authorization is required for Entresto 24-26MG  tablets  Submitted: 06/14/2022 Key [place], CPhT Rx Patient Advocate Phone: [phone]

## 2022-06-14 NOTE — Telephone Encounter (Signed)
Advanced Heart Failure Patient Advocate Encounter  Prior Authorization for Farxiga 10MG  has been approved.   Effective: 06/14/2022 until further notice.  Clista Bernhardt, CPhT Rx Patient Advocate Phone: [phone]

## 2022-06-15 ENCOUNTER — Other Ambulatory Visit (HOSPITAL_COMMUNITY): Payer: Self-pay

## 2022-06-15 NOTE — Telephone Encounter (Signed)
Advanced Heart Failure Patient Advocate Encounter  PA for Entresto denied. Reasoning, EF reported is greater than 40%. [facility]) will work on appealing this denial.   Charlann Boxer, CPhT

## 2022-06-16 ENCOUNTER — Other Ambulatory Visit (HOSPITAL_COMMUNITY): Payer: Self-pay

## 2022-06-23 ENCOUNTER — Other Ambulatory Visit (HOSPITAL_COMMUNITY): Payer: Self-pay

## 2022-06-27 ENCOUNTER — Other Ambulatory Visit (HOSPITAL_COMMUNITY): Payer: Self-pay

## 2022-06-27 ENCOUNTER — Ambulatory Visit: Payer: Medicaid Other | Attending: Critical Care Medicine | Admitting: Pharmacist

## 2022-06-27 DIAGNOSIS — Z9889 Other specified postprocedural states: Secondary | ICD-10-CM | POA: Diagnosis not present

## 2022-06-27 LAB — POCT INR: INR: 3.1 — AB (ref 2.0–3.0)

## 2022-06-28 ENCOUNTER — Other Ambulatory Visit (HOSPITAL_COMMUNITY): Payer: Self-pay

## 2022-06-28 ENCOUNTER — Telehealth (HOSPITAL_COMMUNITY): Payer: Self-pay | Admitting: Pharmacist

## 2022-06-28 NOTE — Telephone Encounter (Signed)
Advanced Heart Failure Patient Advocate Encounter  Prior Authorization appeal for Lisa Ayala has been approved.    PA# [id number] Effective dates: 06/28/22 through 06/28/23  Document scanned to chart.   Charlann Boxer, CPhT

## 2022-06-28 NOTE — Telephone Encounter (Signed)
Advanced Heart Failure Patient Advocate Encounter  Prior Authorization for Delene Loll was denied from [facility] as patient's EF is >40%. Appeal submitted.   Patient has access to [facility] through [employer] PAP and should not have an interruption in therapy while appeal is processed.   Audry Riles, PharmD, BCPS, BCCP, CPP Heart Failure Clinic Pharmacist [phone]

## 2022-06-29 ENCOUNTER — Ambulatory Visit: Payer: Medicaid Other | Admitting: Occupational Therapy

## 2022-07-03 ENCOUNTER — Ambulatory Visit: Payer: Medicaid Other | Attending: Physical Medicine and Rehabilitation | Admitting: Occupational Therapy

## 2022-07-07 ENCOUNTER — Other Ambulatory Visit (HOSPITAL_COMMUNITY): Payer: Self-pay

## 2022-07-10 ENCOUNTER — Other Ambulatory Visit (HOSPITAL_COMMUNITY): Payer: Self-pay

## 2022-07-10 ENCOUNTER — Encounter (HOSPITAL_COMMUNITY): Payer: Self-pay | Admitting: Internal Medicine

## 2022-07-10 ENCOUNTER — Ambulatory Visit (HOSPITAL_COMMUNITY)
Admission: RE | Admit: 2022-07-10 | Discharge: 2022-07-10 | Disposition: A | Discharge status: Home / Self Care | Payer: Medicaid Other | Source: Ambulatory Visit | Attending: Internal Medicine | Admitting: Internal Medicine

## 2022-07-10 ENCOUNTER — Other Ambulatory Visit: Payer: Self-pay | Admitting: Physical Medicine and Rehabilitation

## 2022-07-10 ENCOUNTER — Other Ambulatory Visit: Payer: Self-pay

## 2022-07-10 VITALS — BP 152/98 | HR 88 | Wt 169.4 lb

## 2022-07-10 DIAGNOSIS — Z7984 Long term (current) use of oral hypoglycemic drugs: Secondary | ICD-10-CM | POA: Insufficient documentation

## 2022-07-10 DIAGNOSIS — I340 Nonrheumatic mitral (valve) insufficiency: Secondary | ICD-10-CM | POA: Diagnosis not present

## 2022-07-10 DIAGNOSIS — Z8674 Personal history of sudden cardiac arrest: Secondary | ICD-10-CM | POA: Diagnosis not present

## 2022-07-10 DIAGNOSIS — F1910 Other psychoactive substance abuse, uncomplicated: Secondary | ICD-10-CM | POA: Diagnosis not present

## 2022-07-10 DIAGNOSIS — K5900 Constipation, unspecified: Secondary | ICD-10-CM

## 2022-07-10 DIAGNOSIS — Z952 Presence of prosthetic heart valve: Secondary | ICD-10-CM | POA: Diagnosis not present

## 2022-07-10 DIAGNOSIS — Z5941 Food insecurity: Secondary | ICD-10-CM | POA: Diagnosis not present

## 2022-07-10 DIAGNOSIS — Z79899 Other long term (current) drug therapy: Secondary | ICD-10-CM | POA: Insufficient documentation

## 2022-07-10 DIAGNOSIS — I480 Paroxysmal atrial fibrillation: Secondary | ICD-10-CM | POA: Diagnosis not present

## 2022-07-10 DIAGNOSIS — I5032 Chronic diastolic (congestive) heart failure: Secondary | ICD-10-CM | POA: Diagnosis present

## 2022-07-10 DIAGNOSIS — I110 Hypertensive heart disease with heart failure: Secondary | ICD-10-CM | POA: Diagnosis not present

## 2022-07-10 DIAGNOSIS — Z7982 Long term (current) use of aspirin: Secondary | ICD-10-CM | POA: Diagnosis not present

## 2022-07-10 DIAGNOSIS — Z7901 Long term (current) use of anticoagulants: Secondary | ICD-10-CM | POA: Diagnosis not present

## 2022-07-10 DIAGNOSIS — I471 Supraventricular tachycardia, unspecified: Secondary | ICD-10-CM | POA: Insufficient documentation

## 2022-07-10 DIAGNOSIS — I082 Rheumatic disorders of both aortic and tricuspid valves: Secondary | ICD-10-CM | POA: Diagnosis not present

## 2022-07-10 DIAGNOSIS — Z5986 Financial insecurity: Secondary | ICD-10-CM | POA: Insufficient documentation

## 2022-07-10 DIAGNOSIS — I2510 Atherosclerotic heart disease of native coronary artery without angina pectoris: Secondary | ICD-10-CM | POA: Diagnosis not present

## 2022-07-10 DIAGNOSIS — K219 Gastro-esophageal reflux disease without esophagitis: Secondary | ICD-10-CM | POA: Diagnosis not present

## 2022-07-10 DIAGNOSIS — E875 Hyperkalemia: Secondary | ICD-10-CM | POA: Insufficient documentation

## 2022-07-10 LAB — CBC
HCT: 38.2 % (ref 36.0–46.0)
Hemoglobin: 11.1 g/dL — ABNORMAL LOW (ref 12.0–15.0)
MCH: 23.8 pg — ABNORMAL LOW (ref 26.0–34.0)
MCHC: 29.1 g/dL — ABNORMAL LOW (ref 30.0–36.0)
MCV: 81.8 fL (ref 80.0–100.0)
Platelets: 472 10*3/uL — ABNORMAL HIGH (ref 150–400)
RBC: 4.67 MIL/uL (ref 3.87–5.11)
RDW: 15.8 % — ABNORMAL HIGH (ref 11.5–15.5)
WBC: 7 10*3/uL (ref 4.0–10.5)
nRBC: 0 % (ref 0.0–0.2)

## 2022-07-10 LAB — COMPREHENSIVE METABOLIC PANEL
ALT: 24 U/L (ref 0–44)
AST: 31 U/L (ref 15–41)
Albumin: 3.3 g/dL — ABNORMAL LOW (ref 3.5–5.0)
Alkaline Phosphatase: 148 U/L — ABNORMAL HIGH (ref 38–126)
Anion gap: 7 (ref 5–15)
BUN: 14 mg/dL (ref 6–20)
CO2: 26 mmol/L (ref 22–32)
Calcium: 9.4 mg/dL (ref 8.9–10.3)
Chloride: 106 mmol/L (ref 98–111)
Creatinine, Ser: 1.26 mg/dL — ABNORMAL HIGH (ref 0.44–1.00)
GFR, Estimated: 50 mL/min — ABNORMAL LOW (ref >60)
Glucose, Bld: 102 mg/dL — ABNORMAL HIGH (ref 70–99)
Potassium: 5.1 mmol/L (ref 3.5–5.1)
Sodium: 139 mmol/L (ref 135–145)
Total Bilirubin: 0.2 mg/dL — ABNORMAL LOW (ref 0.3–1.2)
Total Protein: 8.1 g/dL (ref 6.5–8.1)

## 2022-07-10 LAB — BRAIN NATRIURETIC PEPTIDE: B Natriuretic Peptide: 145.3 pg/mL — ABNORMAL HIGH (ref 0.0–100.0)

## 2022-07-10 LAB — PROTIME-INR
INR: 3.2 — ABNORMAL HIGH (ref 0.8–1.2)
Prothrombin Time: 32.2 seconds — ABNORMAL HIGH (ref 11.4–15.2)

## 2022-07-10 MED ORDER — POLYETHYLENE GLYCOL 3350 17 GM/SCOOP PO POWD: 17.0000 g | Freq: Every day | ORAL | 0 refills | Status: DC

## 2022-07-10 MED ORDER — ENTRESTO 49-51 MG PO TABS
1.0000 | ORAL_TABLET | Freq: Two times a day (BID) | ORAL | 3 refills | Status: DC
  Filled 2022-07-10 – 2022-08-14 (×3): qty 60, 30d supply, fill #0
  Filled 2022-09-12: qty 60, 30d supply, fill #1
  Filled 2022-10-11: qty 60, 30d supply, fill #2

## 2022-07-10 MED ORDER — AMIODARONE HCL 200 MG PO TABS: 200.0000 mg | ORAL_TABLET | Freq: Every day | ORAL | 3 refills | Status: DC

## 2022-07-10 NOTE — Patient Instructions (Signed)
Medication Changes:  Increase Entresto to 49/51 mg Twice daily   Decrease Amiodarone to 200 mg DAILY  Lab Work:  Labs done today, your results will be available in MyChart, we will contact you for abnormal readings.  Testing/Procedures:  Your physician has requested that you have an echocardiogram. Echocardiography is a painless test that uses sound waves to create images of your heart. It provides your doctor with information about the size and shape of your heart and how well your heart's chambers and valves are working. This procedure takes approximately one hour. There are no restrictions for this procedure. IN 3 MONTHS Please do NOT wear cologne, perfume, aftershave, or lotions (deodorant is allowed). Please arrive 15 minutes prior to your appointment time.  Referrals:  You have been referred to Pulmonary Rehab, they will call you to schedule   Special Instructions // Education:  Do the following things EVERYDAY: Weigh yourself in the morning before breakfast. Write it down and keep it in a log. Take your medicines as prescribed Eat low salt foods--Limit salt (sodium) to 2000 mg per day.  Stay as active as you can everyday Limit all fluids for the day to less than 2 liters   Follow-Up in: 3 months with echocardiogram  At the [facility], you and your health needs are our priority. We have a designated team specialized in the treatment of Heart Failure. This Care Team includes your primary Heart Failure Specialized Cardiologist (physician), Advanced Practice Providers (APPs- Physician Assistants and Nurse Practitioners), and Pharmacist who all work together to provide you with the care you need, when you need it.   You may see any of the following providers on your designated Care Team at your next follow up:  Dr. Glori Bickers Dr. Loralie Champagne Dr. Roxana Hires, NP Lyda Jester, [place] [facility] [place], [place] Forestine Na,  NP Audry Riles, PharmD   Please be sure to bring in all your medications bottles to every appointment.   Need to Contact us:  If you have any questions or concerns before your next appointment please send [place] a message through [place] or call our office at [phone].    TO LEAVE A MESSAGE FOR THE NURSE SELECT OPTION 2, PLEASE LEAVE A MESSAGE INCLUDING: YOUR NAME DATE OF BIRTH CALL BACK NUMBER REASON FOR CALL**this is important as we prioritize the call backs  YOU WILL RECEIVE A CALL BACK THE SAME DAY AS LONG AS YOU CALL BEFORE 4:00 PM

## 2022-07-10 NOTE — Progress Notes (Signed)
Advanced Heart Failure Clinic Note  PCP: Elsie Stain, MD HF Cardiologist: Dr. Haroldine Laws  HPI: Lisa Ayala is a 56 y.o. with a history of SVT and severe MR. Echo 08/2021 LVEF 70-75% with severe MR, mild Lisa, at least Mod TR and planned for TEE  Admitted 6/23 from EP office with SVT vs AFL, HR 145. UDS + cocaine. Given adenosine 6, 12, and 12 in ED. TEE was scheduled but aborted due to flash pulmonary edema.  AHF team consulted. PEA arrested in Endo. CPR , epi, and bicarb given with ROSC. Urgent intubation. Felt to have component of septic shock 2/2 PNA, as well as CS. Given IV lasix, started on milrinone and NE. Transferred to ICU. Underwent L/RHC showing  nonobstructive CAD. RA 9, PA 50/10, mean PCWP 12, CI 6.8.  Went into rapid SVT, and underwent emergent DCCV. Extubated, and drips weaned. Amio added for rate control. TCTS planning for MVR with MAZE once recovered from acute illness. Hospitalization c/b AKI, anxiety and hypertension. Required CIR for rehab. Discharged home after CIR, weight 141 lbs.  Underwent mechanical MVR/TV repair + Maze and LAA ligation on at 05/04/22 at [facility]   Her for f/u with her daughter. Feels good. Doing all ADLs without problem. Energy returning. No palpitation. No edema, SOB, orthopnea or PND. Warfarin managed by PCP. No bleeding. No teeth now so no SBE prophylaxis   Cardiac Studies: - R/LHC (5/23): mild non-obs CAD, EF 60-65%, severe MR by echo, no significant v-waves in PCWP tracing, high cardiac output - may be overestimated by Fick    Ost RCA to Prox RCA lesion is 20% stenosed.   Prox RCA to Mid RCA lesion is 40% stenosed.   Mid LAD lesion is 40% stenosed.   The left ventricular ejection fraction is 55-65% by visual estimate.   Ao = 139/74 LV = 136/12 RA = 9 RV = 46/10 PA = 50/10 (26) PCW = 12 (no significant v waves) Fick cardiac output/index = 12.2/6.8 PVR = 1.8 WU Ao sat = 99% PA sat = 79%, 80% SVC sat = 85%  Plan/Discussion:  CTS  consulted for MVR.   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 08/2021 Echo: EF 70-75%, no rwma, mod dil LV, GrII DD, nl RV fxn, massively dil LA, mild-mod dil RA, severe MR due to lack of central coaptation of MV leaflets. Mild Lisa. Mod TR. Mild-mod AI.   Tricuspid regurgitation    Current Outpatient Medications  Medication Sig Dispense Refill   amiodarone (PACERONE) 200 MG tablet Take 1 tablet (200 mg total) by mouth 2 (two) times daily. 180 tablet 3   aspirin 81 MG chewable tablet Chew 1 tablet (81 mg total) by mouth daily. 100 tablet 2   dapagliflozin propanediol (FARXIGA) 10 MG TABS tablet Take 1 tablet (10 mg total) by mouth daily. 90 tablet 3   magnesium oxide (MAG-OX) 400 MG tablet Take 1 tablet (400 mg total) by mouth 2 (two) times daily. 60 tablet 0  Multiple Vitamin (MULTIVITAMIN ADULT PO) Take 1 tablet by mouth daily.     pantoprazole (PROTONIX) 40 MG tablet Take 1 tablet (40 mg total) by mouth at bedtime. 30 tablet 3   polyethylene glycol powder (GLYCOLAX/MIRALAX) 17 GM/SCOOP powder Take 17 g by mouth daily dissolved in water. 238 g 0   QUEtiapine (SEROQUEL) 25 MG tablet Take 1 tablet (25 mg total) by mouth at bedtime as needed (sleep). 30 tablet 3   sacubitril-valsartan (ENTRESTO) 24-26 MG Take 1 tablet by mouth 2 (two) times daily. 180 tablet 3   senna-docusate (SENOKOT-S) 8.6-50 MG tablet Take 2 tablets by mouth at bedtime. 60 tablet 4   warfarin (COUMADIN) 4 MG tablet Take 1 tablet (4 mg total) by mouth daily.  30 tablet 2   No current facility-administered medications for this encounter.   No Known Allergies  Social History   Socioeconomic History   Marital status: Divorced    Spouse name: Not on file   Number of children: Not on file   Years of education: Not on file   Highest education level: Not on file  Occupational History   Not on file  Tobacco Use   Smoking status: Never   Smokeless tobacco: Never  Vaping Use   Vaping Use: Never used  Substance and Sexual Activity   Alcohol use: Not Currently    Comment: Occasional   Drug use: Not Currently    Types: Marijuana, Cocaine, Other-see comments    Comment: No cocaine since March. Edible Marijuana for sleep- nightly   Sexual activity: Not Currently  Other Topics Concern   Not on file  Social History Narrative   Lives locally.  Relatively active.   Social Determinants of Health   Financial Resource Strain: High Risk (03/20/2022)   Overall Financial Resource Strain (CARDIA)    Difficulty of Paying Living Expenses: Very hard  Food Insecurity: Food Insecurity Present (03/06/2022)   Hunger Vital Sign    Worried About Running Out of Food in the Last Year: Sometimes true    Ran Out of Food in the Last Year: Sometimes true  Transportation Needs: Unmet Transportation Needs (03/20/2022)   PRAPARE - Hydrologist (Medical): Yes    Lack of Transportation (Non-Medical): Yes  Physical Activity: Not on file  Stress: Not on file  Social Connections: Not on file  Intimate Partner Violence: Not on file   Family History  Family history unknown: Yes   BP (!) 140/88   Pulse 88   Wt 76.8 kg (169 lb 6.4 oz)   SpO2 97%   BMI 26.53 kg/m   Wt Readings from Last 3 Encounters:  07/10/22 76.8 kg (169 lb 6.4 oz)  06/07/22 73.8 kg (162 lb 12.8 oz)  05/23/22 67.5 kg (148 lb 12.8 oz)   PHYSICAL EXAM: General:  Well appearing. No resp difficulty HEENT: normal Neck: supple. no JVD. Carotids 2+ bilat; no bruits. No  lymphadenopathy or thryomegaly appreciated. Cor: PMI nondisplaced. Regular rate & rhythm. Mechanical s1 Lungs: clear Abdomen: soft, nontender, nondistended. No hepatosplenomegaly. No bruits or masses. Good bowel sounds. Extremities: no cyanosis, clubbing, rash, edema Neuro: alert & orientedx3, cranial nerves grossly intact. moves all 4 extremities w/o difficulty. Affect pleasant  ECG: Sinus 82 RBBB Personally reviewed  ASSESSMENT & PLAN:  1. 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 Recheck labs - can restart if able  - BP up increase Entresto to 49/51 bid   2.  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  - Underwent mechanical MVR/TV repair + Maze and LAA ligation on at 05/04/22 at [facility]  - Repeat echo - Reviewed need for SBE prophylaxis  3 . PAF/SVT - In NSR today.  - Underwent Maze with surgery 05/04/22 - Will decrease amio to 200 daily. Can likely stop 3 months after surgery  - Continue warfarin   4. Tobacco/Polysubstance abuse  - UDS on admit + cocaine/THC - She is off smoking    Glori Bickers, MD  9:43 AM

## 2022-07-12 ENCOUNTER — Other Ambulatory Visit (HOSPITAL_COMMUNITY): Payer: Self-pay

## 2022-07-13 ENCOUNTER — Other Ambulatory Visit (HOSPITAL_COMMUNITY): Payer: Self-pay

## 2022-07-14 ENCOUNTER — Other Ambulatory Visit (HOSPITAL_COMMUNITY): Payer: Self-pay

## 2022-07-17 ENCOUNTER — Other Ambulatory Visit: Payer: Self-pay

## 2022-07-21 ENCOUNTER — Other Ambulatory Visit (HOSPITAL_COMMUNITY): Payer: Self-pay

## 2022-07-25 ENCOUNTER — Other Ambulatory Visit (HOSPITAL_COMMUNITY): Payer: Self-pay

## 2022-08-01 ENCOUNTER — Ambulatory Visit: Payer: Medicaid Other | Attending: Family Medicine | Admitting: Pharmacist

## 2022-08-01 DIAGNOSIS — Z9889 Other specified postprocedural states: Secondary | ICD-10-CM | POA: Diagnosis not present

## 2022-08-01 LAB — POCT INR: INR: 2.3 (ref 2.0–3.0)

## 2022-08-02 ENCOUNTER — Other Ambulatory Visit (HOSPITAL_COMMUNITY): Payer: Self-pay

## 2022-08-08 ENCOUNTER — Other Ambulatory Visit (HOSPITAL_COMMUNITY): Payer: Self-pay | Admitting: Internal Medicine

## 2022-08-08 ENCOUNTER — Other Ambulatory Visit (HOSPITAL_COMMUNITY): Payer: Self-pay

## 2022-08-09 ENCOUNTER — Encounter: Payer: Self-pay | Admitting: Physical Medicine and Rehabilitation

## 2022-08-09 ENCOUNTER — Encounter
Payer: Medicaid Other | Attending: Physical Medicine and Rehabilitation | Admitting: Physical Medicine and Rehabilitation

## 2022-08-09 ENCOUNTER — Other Ambulatory Visit (HOSPITAL_COMMUNITY): Payer: Self-pay

## 2022-08-09 VITALS — BP 150/91 | HR 89 | Ht 67.0 in | Wt 170.6 lb

## 2022-08-09 DIAGNOSIS — R531 Weakness: Secondary | ICD-10-CM | POA: Diagnosis not present

## 2022-08-09 DIAGNOSIS — G8918 Other acute postprocedural pain: Secondary | ICD-10-CM | POA: Insufficient documentation

## 2022-08-09 NOTE — Assessment & Plan Note (Signed)
Based on exam, likely from tightness in internal/external shoulder rotator cuff muscles from surgery and immobilization due to sternal precautions.  You have been given a handout outlining some exercises for your left shoulder (orthopedic rotator cuff general exercises and stretches); highlighted are some important stretches for you. Please perform these at least 1-2x daily.   If after a few weeks you are still concerned about shoulder pain, call the office and we can re-send your referral to OT.

## 2022-08-09 NOTE — Assessment & Plan Note (Signed)
Please complete cardiac rehab per your cardiologist's office, which should continue to help with your endurance and fatigue.   Congratulations on the walks with you dog; keep up the good work!  Please continue abstaining from cocaine due to your heart disease.   You can follow up with [place] again only as needed.

## 2022-08-09 NOTE — Patient Instructions (Addendum)
Please complete cardiac rehab per your cardiologist's office, which should continue to help with your endurance and fatigue. Congratulations on the walks with you dog; keep up the good work!   You have been given a handout outlining some exercises for your left shoulder; highlighted are some important stretches for you. Please perform these at least 1-2x daily. If after a few weeks you are still concerned about shoulder pain, call the office and we can re-send your referral to OT.  Please continue abstaining from cocaine due to your heart disease.   You can follow up with [place] again only as needed.

## 2022-08-09 NOTE — Progress Notes (Signed)
Subjective:    Patient ID: NARELLE SCHOENING, female    DOB: [birth year], 56 y.o.   MRN: [id number]  HPI Ms. Weger is a 56 year old female wiith pmhx PSVT, severe MR, moderate TR and mild to moderate AI, anxiety disorder, regular marijuana use, and hx cocaine use with IPR admission 02/21/22-02/24/22, now s/p mitral and tricuspid valve replacements + atrial appendage clipping at [facility]. She is presenting for follow up of fatigue and pain.   Plan from last visit:  Acute post-operative pain Assessment & Plan: Chest pain right sided and reproducible on palpation of pectoralis muscle; likely post-op MSK pain from surgical intervention at the sternum.    OT referral to begin ROM, strengthening after sternal precautions lifted. Maintain cardiac precautions. Weakness generalized Assessment & Plan: Likely cardiovascular deconditioning related to recent heart surgery.    PT script written for endurance training once sternal precautions lifted. Maintain cardiac precautions in therapies.   Follow up in 2 months after therapies to re-assess symptoms.    S/P MVR (mitral valve repair) Assessment & Plan: Daily weights consistently taken per patient and without notable increase since surgery. No s/s HF or volume overload on exam.    Patient endorses she is no longer using cocaine.    Follow up with cardiology as scheduled, no concerning signs warranting immediate evaluation.  History of substance use Assessment & Plan: Patient denies current cocaine use.   Given Hx marijuana use, cautioned against chronic use of inhalants given Hx cardiovascular disease and need to maintain lung health.     Interval Hx:  - Patient states she is doing well; still not at 100%, states she is at 50% of energy level prior to her hospitalizations/surgeries.  - She states she will be doing cardiac rehab soon, waiting for a call on that.  - Got a puppy recently, which has been helping her get outside and walk. She is  walking 4x per day, can go about a mile before getting tired and needing a break. - Pain in R pect sometimes occurs; she states it aches and cramps, and she still has issues with range of motion with that arm. She is taking Tylenol 500 mg daily and ice for pain control.   Pain Inventory Average Pain 1 Pain Right Now 1 My pain is aching  In the last 24 hours, has pain interfered with the following? General activity 0 Relation with others 0 Enjoyment of life 0 What TIME of day is your pain at its worst? morning  Sleep (in general) Good  Pain is worse with: some activites Pain improves with: medication Relief from Meds: 4  Family History  Family history unknown: Yes   Social History   Socioeconomic History   Marital status: Divorced    Spouse name: Not on file   Number of children: Not on file   Years of education: Not on file   Highest education level: Not on file  Occupational History   Not on file  Tobacco Use   Smoking status: Never   Smokeless tobacco: Never  Vaping Use   Vaping Use: Never used  Substance and Sexual Activity   Alcohol use: Not Currently    Comment: Occasional   Drug use: Not Currently    Types: Marijuana, Cocaine, Other-see comments    Comment: No cocaine since March. Edible Marijuana for sleep- nightly   Sexual activity: Not Currently  Other Topics Concern   Not on file  Social History Narrative   Lives  locally.  Relatively active.   Social Determinants of Health   Financial Resource Strain: High Risk (03/20/2022)   Overall Financial Resource Strain (CARDIA)    Difficulty of Paying Living Expenses: Very hard  Food Insecurity: Food Insecurity Present (03/06/2022)   Hunger Vital Sign    Worried About Running Out of Food in the Last Year: Sometimes true    Ran Out of Food in the Last Year: Sometimes true  Transportation Needs: Unmet Transportation Needs (03/20/2022)   PRAPARE - Administrator, Civil Service (Medical): Yes    Lack  of Transportation (Non-Medical): Yes  Physical Activity: Not on file  Stress: Not on file  Social Connections: Not on file   Past Surgical History:  Procedure Laterality Date   DILATION AND CURETTAGE OF UTERUS     RIGHT/LEFT HEART CATH AND CORONARY ANGIOGRAPHY N/A 02/10/2022   Procedure: RIGHT/LEFT HEART CATH AND CORONARY ANGIOGRAPHY;  Surgeon: Dolores Patty, MD;  Location: [facility] INVASIVE CV LAB;  Service: Cardiovascular;  Laterality: N/A;   TEE WITHOUT CARDIOVERSION N/A 02/08/2022   Procedure: TRANSESOPHAGEAL ECHOCARDIOGRAM (TEE);  Surgeon: Sande Rives, MD;  Location: [facility] ENDOSCOPY;  Service: Cardiovascular;  Laterality: N/A;   TOOTH EXTRACTION N/A 04/27/2022   Procedure: DENTAL RESTORATION/EXTRACTIONS;  Surgeon: Sharman Cheek, DMD;  Location: [facility] OR;  Service: Dentistry;  Laterality: N/A;   Past Surgical History:  Procedure Laterality Date   DILATION AND CURETTAGE OF UTERUS     RIGHT/LEFT HEART CATH AND CORONARY ANGIOGRAPHY N/A 02/10/2022   Procedure: RIGHT/LEFT HEART CATH AND CORONARY ANGIOGRAPHY;  Surgeon: Dolores Patty, MD;  Location: [facility] INVASIVE CV LAB;  Service: Cardiovascular;  Laterality: N/A;   TEE WITHOUT CARDIOVERSION N/A 02/08/2022   Procedure: TRANSESOPHAGEAL ECHOCARDIOGRAM (TEE);  Surgeon: Sande Rives, MD;  Location: 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 08/2021 Echo: EF 70-75%, no rwma, mod dil LV, GrII DD, nl RV fxn, massively dil LA, mild-mod dil RA, severe MR due to lack of  central coaptation of MV leaflets. Mild MS. Mod TR. Mild-mod AI.   Tricuspid regurgitation    BP (!) 150/91   Pulse 89   Ht 5\' 7"  (1.702 m)   Wt 170 lb 9.6 oz (77.4 kg)   SpO2 96%   BMI 26.72 kg/m   Opioid Risk Score:   Fall Risk Score:  `1  Depression screen [facility] 2/9     08/09/2022   11:12 AM 06/07/2022    9:21 AM 04/11/2022   11:26 AM  Depression screen PHQ 2/9  Decreased Interest 0 0 0  Down, Depressed, Hopeless 0 0 0  PHQ - 2 Score 0 0 0  Altered sleeping  0   Tired, decreased energy  0   Change in appetite  0   Feeling bad or failure about yourself   0   Trouble concentrating  0   Moving slowly or fidgety/restless  0   Suicidal thoughts  0   PHQ-9 Score  0   Difficult doing work/chores  Not difficult  at all      Review of Systems  Constitutional: Negative.   HENT: Negative.    Eyes: Negative.   Respiratory: Negative.    Cardiovascular: Negative.   Gastrointestinal: Negative.   Endocrine: Negative.   Genitourinary: Negative.   Musculoskeletal:        Chest area  Skin: Negative.   Allergic/Immunologic: Negative.   Neurological: Negative.   Hematological:  Bruises/bleeds easily.       Warfarin  Psychiatric/Behavioral: Negative.    All other systems reviewed and are negative.      Objective:   Physical Exam   Constitution: Appropriate appearance for age. No apparnet distress   HEENT: PERRL, EOMI grossly intact.  Resp: CTAB. No rales, rhonchi, or wheezing. Cardio: RRR. +murmur. No peripheral edema. + compression stockings Abdomen: Nondistended. Nontender. +bowel sounds. Psych: Appropriate mood and affect. Neuro: AAOx4. No apparent deficits  Skin: Well healed sternal scar    MSK: 5/5 strength in bilateral upper extremities. AROM in R shoulder adduction, internal and external rotation limited; shoulder abduction, flexion, and extension WNL. Mild TTP over right pectoralis muscle.      Assessment & Plan:  Ms. Huettner is a 56 year old female wiith  pmhx PSVT, severe MR, moderate TR and mild to moderate AI, anxiety disorder, regular marijuana use, and hx cocaine use with IPR admission 02/21/22-02/24/22, now s/p mitral and tricuspid valve replacements + atrial appendage clipping at [facility]. She is presenting for follow up of fatigue and R shoulder pain.  Weakness generalized Assessment & Plan: Please complete cardiac rehab per your cardiologist's office, which should continue to help with your endurance and fatigue.   Congratulations on the walks with you dog; keep up the good work!  Please continue abstaining from cocaine due to your heart disease.   You can follow up with [place] again only as needed.    Acute post-operative pain Assessment & Plan: Based on exam, likely from tightness in internal/external shoulder rotator cuff muscles from surgery and immobilization due to sternal precautions.  You have been given a handout outlining some exercises for your left shoulder (orthopedic rotator cuff general exercises and stretches); highlighted are some important stretches for you. Please perform these at least 1-2x daily.   If after a few weeks you are still concerned about shoulder pain, call the office and we can re-send your referral to OT.      Angelina Sheriff, DO 08/09/2022

## 2022-08-10 ENCOUNTER — Other Ambulatory Visit (HOSPITAL_COMMUNITY): Payer: Self-pay

## 2022-08-14 ENCOUNTER — Other Ambulatory Visit: Payer: Self-pay

## 2022-08-14 ENCOUNTER — Other Ambulatory Visit (HOSPITAL_COMMUNITY): Payer: Self-pay

## 2022-08-16 ENCOUNTER — Other Ambulatory Visit (HOSPITAL_COMMUNITY): Payer: Self-pay

## 2022-08-16 MED ORDER — AMIODARONE HCL 200 MG PO TABS
200.0000 mg | ORAL_TABLET | Freq: Every day | ORAL | 3 refills | Status: DC
  Filled 2022-08-16: qty 90, 90d supply, fill #0

## 2022-08-29 ENCOUNTER — Other Ambulatory Visit: Payer: Self-pay

## 2022-08-29 ENCOUNTER — Other Ambulatory Visit: Payer: Self-pay | Admitting: Critical Care Medicine

## 2022-08-29 ENCOUNTER — Other Ambulatory Visit (HOSPITAL_COMMUNITY): Payer: Self-pay

## 2022-08-29 DIAGNOSIS — Z9889 Other specified postprocedural states: Secondary | ICD-10-CM

## 2022-08-29 MED ORDER — WARFARIN SODIUM 4 MG PO TABS
4.0000 mg | ORAL_TABLET | Freq: Every day | ORAL | 2 refills | Status: DC
  Filled 2022-08-29 (×3): qty 30, 30d supply, fill #0
  Filled 2022-09-27 – 2022-09-29 (×2): qty 30, 30d supply, fill #1
  Filled 2022-11-01: qty 30, 30d supply, fill #2

## 2022-09-07 ENCOUNTER — Ambulatory Visit: Payer: Medicaid Other | Attending: Critical Care Medicine | Admitting: Pharmacist

## 2022-09-07 DIAGNOSIS — Z9889 Other specified postprocedural states: Secondary | ICD-10-CM | POA: Diagnosis not present

## 2022-09-07 LAB — POCT INR: INR: 3.2 — AB (ref 2.0–3.0)

## 2022-09-12 ENCOUNTER — Other Ambulatory Visit (HOSPITAL_COMMUNITY): Payer: Self-pay

## 2022-09-19 ENCOUNTER — Telehealth (HOSPITAL_COMMUNITY): Payer: Self-pay | Admitting: *Deleted

## 2022-09-19 NOTE — Telephone Encounter (Signed)
Echo auth request faxed to [facility]   

## 2022-09-29 ENCOUNTER — Other Ambulatory Visit (HOSPITAL_COMMUNITY): Payer: Self-pay

## 2022-10-10 ENCOUNTER — Ambulatory Visit (HOSPITAL_COMMUNITY): Payer: Medicaid Other

## 2022-10-10 ENCOUNTER — Encounter (HOSPITAL_COMMUNITY): Payer: Medicaid Other

## 2022-10-12 ENCOUNTER — Other Ambulatory Visit (HOSPITAL_COMMUNITY): Payer: Self-pay

## 2022-10-13 ENCOUNTER — Other Ambulatory Visit (HOSPITAL_COMMUNITY): Payer: Self-pay

## 2022-10-20 ENCOUNTER — Ambulatory Visit: Payer: Medicaid Other | Admitting: Pharmacist

## 2022-10-20 ENCOUNTER — Ambulatory Visit: Payer: Medicaid Other | Attending: Family Medicine | Admitting: Pharmacist

## 2022-10-20 DIAGNOSIS — Z9889 Other specified postprocedural states: Secondary | ICD-10-CM | POA: Diagnosis not present

## 2022-10-20 LAB — POCT INR: POC INR: 3.3

## 2022-10-23 NOTE — Progress Notes (Signed)
Advanced Heart Failure Clinic Note  PCP: Elsie Stain, MD HF Cardiologist: Dr. Haroldine Laws  HPI: Ms Fukuhara is a 57 y.o. with a history of SVT and severe MR. Echo 08/2021 LVEF 70-75% with severe MR, mild MS, at least Mod TR and planned for TEE  Admitted 6/23 from EP office with SVT vs AFL, HR 145. UDS + cocaine. Given adenosine 6, 12, and 12 in ED. TEE was scheduled but aborted due to flash pulmonary edema.  AHF team consulted. PEA arrested in Endo. CPR , epi, and bicarb given with ROSC. Urgent intubation. Felt to have component of septic shock 2/2 PNA, as well as CS. Given IV lasix, started on milrinone and NE. Transferred to ICU. Underwent L/RHC showing  nonobstructive CAD. RA 9, PA 50/10, mean PCWP 12, CI 6.8.  Went into rapid SVT, and underwent emergent DCCV. Extubated, and drips weaned. Amio added for rate control. TCTS planning for MVR with MAZE once recovered from acute illness. Hospitalization c/b AKI, anxiety and hypertension. Required CIR for rehab. Discharged home after CIR, weight 141 lbs.  Underwent mechanical MVR/TV repair + Maze and LAA ligation on at 05/04/22 at [facility]   Her for f/u with her daughter. Feels good. Doing all ADLs without problem. Energy returning. No palpitation. No edema, SOB, orthopnea or PND. Warfarin managed by PCP. No bleeding. No teeth now so no SBE prophylaxis   Cardiac Studies: - R/LHC (5/23): mild non-obs CAD, EF 60-65%, severe MR by echo, no significant v-waves in PCWP tracing, high cardiac output - may be overestimated by Fick    Ost RCA to Prox RCA lesion is 20% stenosed.   Prox RCA to Mid RCA lesion is 40% stenosed.   Mid LAD lesion is 40% stenosed.   The left ventricular ejection fraction is 55-65% by visual estimate.   Ao = 139/74 LV = 136/12 RA = 9 RV = 46/10 PA = 50/10 (26) PCW = 12 (no significant v waves) Fick cardiac output/index = 12.2/6.8 PVR = 1.8 WU Ao sat = 99% PA sat = 79%, 80% SVC sat = 85%  Plan/Discussion:  CTS  consulted for MVR.   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 08/2021 Echo: EF 70-75%, no rwma, mod dil LV, GrII DD, nl RV fxn, massively dil LA, mild-mod dil RA, severe MR due to lack of central coaptation of MV leaflets. Mild MS. Mod TR. Mild-mod AI.   Tricuspid regurgitation    Current Outpatient Medications  Medication Sig Dispense Refill   amiodarone (PACERONE) 200 MG tablet Take 1 tablet (200 mg total) by mouth daily. 180 tablet 3   aspirin 81 MG chewable tablet Chew 1 tablet (81 mg total) by mouth daily. 100 tablet 2   dapagliflozin propanediol (FARXIGA) 10 MG TABS tablet Take 1 tablet (10 mg total) by mouth daily. 90 tablet 3   magnesium oxide (MAG-OX) 400 MG tablet Take 1 tablet (400 mg total) by mouth 2 (two) times daily. 60 tablet 0   Multiple Vitamin (MULTIVITAMIN  ADULT PO) Take 1 tablet by mouth daily.     pantoprazole (PROTONIX) 40 MG tablet Take 1 tablet (40 mg total) by mouth at bedtime. 30 tablet 3   polyethylene glycol powder (GLYCOLAX/MIRALAX) 17 GM/SCOOP powder Take 17 g by mouth daily dissolved in water. 238 g 0   QUEtiapine (SEROQUEL) 25 MG tablet Take 1 tablet (25 mg total) by mouth at bedtime as needed (sleep). 30 tablet 3   sacubitril-valsartan (ENTRESTO) 49-51 MG Take 1 tablet by mouth 2 (two) times daily. 60 tablet 3   senna-docusate (SENOKOT-S) 8.6-50 MG tablet Take 2 tablets by mouth at bedtime. 60 tablet 4   warfarin (COUMADIN) 4 MG tablet Take 1 tablet (4 mg total) by mouth daily. 30 tablet 2    No current facility-administered medications for this visit.   No Known Allergies  Social History   Socioeconomic History   Marital status: Divorced    Spouse name: Not on file   Number of children: Not on file   Years of education: Not on file   Highest education level: Not on file  Occupational History   Not on file  Tobacco Use   Smoking status: Never   Smokeless tobacco: Never  Vaping Use   Vaping Use: Never used  Substance and Sexual Activity   Alcohol use: Not Currently    Comment: Occasional   Drug use: Not Currently    Types: Marijuana, Cocaine, Other-see comments    Comment: No cocaine since March. Edible Marijuana for sleep- nightly   Sexual activity: Not Currently  Other Topics Concern   Not on file  Social History Narrative   Lives locally.  Relatively active.   Social Determinants of Health   Financial Resource Strain: High Risk (03/20/2022)   Overall Financial Resource Strain (CARDIA)    Difficulty of Paying Living Expenses: Very hard  Food Insecurity: Food Insecurity Present (03/06/2022)   Hunger Vital Sign    Worried About Running Out of Food in the Last Year: Sometimes true    Ran Out of Food in the Last Year: Sometimes true  Transportation Needs: Unmet Transportation Needs (03/20/2022)   PRAPARE - Administrator, Civil Service (Medical): Yes    Lack of Transportation (Non-Medical): Yes  Physical Activity: Not on file  Stress: Not on file  Social Connections: Not on file  Intimate Partner Violence: Not on file   Family History  Family history unknown: Yes   There were no vitals taken for this visit.  Wt Readings from Last 3 Encounters:  08/09/22 77.4 kg (170 lb 9.6 oz)  07/10/22 76.8 kg (169 lb 6.4 oz)  06/07/22 73.8 kg (162 lb 12.8 oz)   PHYSICAL EXAM: General:  Well appearing. No resp difficulty HEENT: normal Neck: supple. no JVD. Carotids 2+ bilat; no bruits. No lymphadenopathy or thryomegaly appreciated. Cor: PMI  nondisplaced. Regular rate & rhythm. Mechanical s1 Lungs: clear Abdomen: soft, nontender, nondistended. No hepatosplenomegaly. No bruits or masses. Good bowel sounds. Extremities: no cyanosis, clubbing, rash, edema Neuro: alert & orientedx3, cranial nerves grossly intact. moves all 4 extremities w/o difficulty. Affect pleasant  ECG: Sinus 82 RBBB Personally reviewed  ASSESSMENT & PLAN:  1. 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 Recheck labs - can restart if able  - BP up increase Entresto  to 49/51 bid   2.  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  - Underwent mechanical MVR/TV repair + Maze and LAA ligation on at 05/04/22 at [facility]  - Repeat echo - Reviewed need for SBE prophylaxis  3 . PAF/SVT - In NSR today.  - Underwent Maze with surgery 05/04/22 - Will decrease amio to 200 daily. Can likely stop 3 months after surgery  - Continue warfarin   4. Tobacco/Polysubstance abuse  - UDS on admit + cocaine/THC - She is off smoking    Rafael Bihari, FNP  10:39 AM

## 2022-10-25 ENCOUNTER — Encounter (HOSPITAL_COMMUNITY): Payer: Self-pay

## 2022-10-25 ENCOUNTER — Ambulatory Visit (HOSPITAL_COMMUNITY)
Admission: RE | Admit: 2022-10-25 | Discharge: 2022-10-25 | Disposition: A | Discharge status: Home / Self Care | Payer: Medicaid Other | Source: Ambulatory Visit | Attending: Internal Medicine | Admitting: Internal Medicine

## 2022-10-25 ENCOUNTER — Ambulatory Visit (HOSPITAL_BASED_OUTPATIENT_CLINIC_OR_DEPARTMENT_OTHER)
Admission: RE | Admit: 2022-10-25 | Discharge: 2022-10-25 | Disposition: A | Discharge status: Home / Self Care | Payer: Medicaid Other | Source: Ambulatory Visit | Attending: Family Medicine | Admitting: Family Medicine

## 2022-10-25 VITALS — BP 150/100 | HR 89 | Wt 173.0 lb

## 2022-10-25 DIAGNOSIS — I471 Supraventricular tachycardia, unspecified: Secondary | ICD-10-CM

## 2022-10-25 DIAGNOSIS — Z952 Presence of prosthetic heart valve: Secondary | ICD-10-CM

## 2022-10-25 DIAGNOSIS — I480 Paroxysmal atrial fibrillation: Secondary | ICD-10-CM | POA: Insufficient documentation

## 2022-10-25 DIAGNOSIS — F1210 Cannabis abuse, uncomplicated: Secondary | ICD-10-CM | POA: Diagnosis not present

## 2022-10-25 DIAGNOSIS — I340 Nonrheumatic mitral (valve) insufficiency: Secondary | ICD-10-CM

## 2022-10-25 DIAGNOSIS — F1910 Other psychoactive substance abuse, uncomplicated: Secondary | ICD-10-CM | POA: Diagnosis not present

## 2022-10-25 DIAGNOSIS — F1410 Cocaine abuse, uncomplicated: Secondary | ICD-10-CM | POA: Diagnosis not present

## 2022-10-25 DIAGNOSIS — I4510 Unspecified right bundle-branch block: Secondary | ICD-10-CM | POA: Insufficient documentation

## 2022-10-25 DIAGNOSIS — I5032 Chronic diastolic (congestive) heart failure: Secondary | ICD-10-CM

## 2022-10-25 DIAGNOSIS — I2510 Atherosclerotic heart disease of native coronary artery without angina pectoris: Secondary | ICD-10-CM | POA: Diagnosis not present

## 2022-10-25 DIAGNOSIS — Z87891 Personal history of nicotine dependence: Secondary | ICD-10-CM | POA: Insufficient documentation

## 2022-10-25 DIAGNOSIS — I082 Rheumatic disorders of both aortic and tricuspid valves: Secondary | ICD-10-CM | POA: Diagnosis not present

## 2022-10-25 DIAGNOSIS — F1911 Other psychoactive substance abuse, in remission: Secondary | ICD-10-CM

## 2022-10-25 DIAGNOSIS — Z7901 Long term (current) use of anticoagulants: Secondary | ICD-10-CM | POA: Diagnosis not present

## 2022-10-25 DIAGNOSIS — Z79899 Other long term (current) drug therapy: Secondary | ICD-10-CM | POA: Diagnosis not present

## 2022-10-25 DIAGNOSIS — I110 Hypertensive heart disease with heart failure: Secondary | ICD-10-CM | POA: Insufficient documentation

## 2022-10-25 LAB — ECHOCARDIOGRAM COMPLETE
AR max vel: 1.25 cm2
AV Area VTI: 1.25 cm2
AV Area mean vel: 1.22 cm2
AV Mean grad: 13 mmHg
AV Peak grad: 25.2 mmHg
Ao pk vel: 2.51 m/s
Area-P 1/2: 3.99 cm2
MV VTI: 1.56 cm2
S' Lateral: 3 cm
Single Plane A4C EF: 58.7 %

## 2022-10-25 LAB — BASIC METABOLIC PANEL
Anion gap: 11 (ref 5–15)
BUN: 12 mg/dL (ref 6–20)
CO2: 24 mmol/L (ref 22–32)
Calcium: 9.2 mg/dL (ref 8.9–10.3)
Chloride: 105 mmol/L (ref 98–111)
Creatinine, Ser: 1.34 mg/dL — ABNORMAL HIGH (ref 0.44–1.00)
GFR, Estimated: 46 mL/min — ABNORMAL LOW (ref >60)
Glucose, Bld: 116 mg/dL — ABNORMAL HIGH (ref 70–99)
Potassium: 4.2 mmol/L (ref 3.5–5.1)
Sodium: 140 mmol/L (ref 135–145)

## 2022-10-25 LAB — BRAIN NATRIURETIC PEPTIDE: B Natriuretic Peptide: 104.5 pg/mL — ABNORMAL HIGH (ref 0.0–100.0)

## 2022-10-25 NOTE — Patient Instructions (Signed)
STOP Amiodarone  Labs today We will only contact you if something comes back abnormal or we need to make some changes. Otherwise no news is good news!  You have been referred to [facility] -they will be in touch with an appointment to arrange orientation  Your physician wants you to follow-up in: 3-4 months with Dr Haroldine Laws. You will receive a reminder letter in the mail two months in advance. If you don't receive a letter, please call our office to schedule the follow-up appointment.  Do the following things EVERYDAY: Weigh yourself in the morning before breakfast. Write it down and keep it in a log. Take your medicines as prescribed Eat low salt foods--Limit salt (sodium) to 2000 mg per day.  Stay as active as you can everyday Limit all fluids for the day to less than 2 liters  At the [facility], you and your health needs are our priority. As part of our continuing mission to provide you with exceptional heart care, we have created designated Provider Care Teams. These Care Teams include your primary Cardiologist (physician) and Advanced Practice Providers (APPs- Physician Assistants and Nurse Practitioners) who all work together to provide you with the care you need, when you need it.   You may see any of the following providers on your designated Care Team at your next follow up: Dr Glori Bickers Dr Loralie Champagne Dr. Roxana Hires, NP Lyda Jester, [place] [facility] [place], [place] Forestine Na, NP Audry Riles, PharmD   Please be sure to bring in all your medications bottles to every appointment.    Thank you for choosing [facility]   If you have any questions or concerns before your next appointment please send [place] a message through [place] or call our office at [phone].    TO LEAVE A MESSAGE FOR THE NURSE SELECT OPTION 2, PLEASE LEAVE A MESSAGE INCLUDING: YOUR  NAME DATE OF BIRTH CALL BACK NUMBER REASON FOR CALL**this is important as we prioritize the call backs  YOU WILL RECEIVE A CALL BACK THE SAME DAY AS LONG AS YOU CALL BEFORE 4:00 PM

## 2022-10-25 NOTE — Progress Notes (Signed)
  Echocardiogram 2D Echocardiogram has been performed.  Lisa Ayala 10/25/2022, 9:07 AM

## 2022-10-26 ENCOUNTER — Encounter (HOSPITAL_COMMUNITY): Payer: Self-pay

## 2022-10-26 ENCOUNTER — Telehealth (HOSPITAL_COMMUNITY): Payer: Self-pay

## 2022-10-26 NOTE — Telephone Encounter (Signed)
Pt insurance is active and benefits verified through [facility] Co-pay $4, DED 0/0 met, out of pocket 0/0 met, co-insurance 0%. no pre-authorization required, Kevin/[employer] 10/26/22@2 :02pm, REF# [id number] G CODES ARE NOT COVERED   How many CR sessions are covered? (36 sessions for TCR)36 Is this a lifetime maximum or an annual maximum? annual Has the member used any of these services to date? no Is there a time limit (weeks/months) on start of program and/or program completion? no

## 2022-10-26 NOTE — Telephone Encounter (Signed)
Called patient to see if she was interested in participating in the Cardiac Rehab Program. Patient stated yes. Patient will come in for orientation on 11/01/22@10am  and will attend the 12:30pm exercise class.   Sent information via myChart.

## 2022-10-27 ENCOUNTER — Ambulatory Visit (HOSPITAL_COMMUNITY)
Admission: EM | Admit: 2022-10-27 | Discharge: 2022-10-27 | Disposition: A | Discharge status: Home / Self Care | Payer: Medicaid Other | Attending: Family Medicine | Admitting: Family Medicine

## 2022-10-27 ENCOUNTER — Ambulatory Visit: Payer: Self-pay

## 2022-10-27 ENCOUNTER — Encounter (HOSPITAL_COMMUNITY): Payer: Self-pay | Admitting: Emergency Medicine

## 2022-10-27 DIAGNOSIS — H1132 Conjunctival hemorrhage, left eye: Secondary | ICD-10-CM

## 2022-10-27 DIAGNOSIS — Z0189 Encounter for other specified special examinations: Secondary | ICD-10-CM

## 2022-10-27 NOTE — ED Notes (Signed)
Pt stuck 3 times for lab specimen ordered all without success. Pt didn't want to be stuck anymore. Stated that she would follow up with her provider.  Made dr Windy Carina aware.

## 2022-10-27 NOTE — ED Provider Notes (Addendum)
[facility]    CSN: [id number] Arrival date & time: 10/27/22  1715      History   Chief Complaint Chief Complaint  Patient presents with   Eye Problem    HPI Lisa Ayala is a 57 y.o. female.    Eye Problem  Here for a red eye.  She noticed a red spot in her left eye this afternoon.  No pain and no irritation.  No eye discharge.  She takes warfarin for cardiac disease, and so comes in today for a pro time measurement.  No other bleeding.  No bleeding from her gums.  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 08/2021 Echo: EF 70-75%, no rwma, mod dil LV, GrII DD, nl RV fxn, massively dil LA, mild-mod dil RA, severe MR due to lack of central coaptation of MV leaflets. Mild MS. Mod TR. Mild-mod AI.   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   Long term current use of anticoagulant therapy 04/17/2022   Chronic diastolic heart failure ([place]) 03/01/2022   History of rheumatic heart disease     Past Surgical History:  Procedure Laterality Date   DILATION AND CURETTAGE OF UTERUS     RIGHT/LEFT HEART CATH AND CORONARY ANGIOGRAPHY N/A 02/10/2022   Procedure: RIGHT/LEFT HEART CATH AND CORONARY ANGIOGRAPHY;  Surgeon: Jolaine Artist, MD;   Location: [facility];  Service: Cardiovascular;  Laterality: N/A;   TEE WITHOUT CARDIOVERSION N/A 02/08/2022   Procedure: TRANSESOPHAGEAL ECHOCARDIOGRAM (TEE);  Surgeon: Geralynn Rile, MD;  Location: [facility];  Service: Cardiovascular;  Laterality: N/A;   TOOTH EXTRACTION N/A 04/27/2022   Procedure: DENTAL RESTORATION/EXTRACTIONS;  Surgeon: Charlaine Dalton, DMD;  Location: [facility];  Service: Dentistry;  Laterality: N/A;    OB History   No obstetric history on file.      Home Medications    Prior to Admission medications   Medication Sig Start Date End Date Taking? Authorizing Provider  aspirin 81 MG chewable tablet Chew 1 tablet (81 mg total) by mouth daily. 06/13/22   Elsie Stain, MD  dapagliflozin propanediol (FARXIGA) 10 MG TABS tablet Take 1 tablet (10 mg total) by mouth daily. 03/20/22   Bensimhon, Shaune Pascal, MD  magnesium oxide (MAG-OX) 400 MG tablet Take 1 tablet (400 mg total) by mouth 2 (two) times daily. Patient not taking: Reported on 10/25/2022 03/27/22   Bensimhon, Shaune Pascal, MD  Multiple Vitamin (MULTIVITAMIN ADULT PO) Take 1 tablet by mouth daily.    [provider]  polyethylene glycol powder (GLYCOLAX/MIRALAX) 17 GM/SCOOP powder Take 17 g by mouth daily dissolved in water. 07/10/22   Gertie Gowda, DO  sacubitril-valsartan (ENTRESTO) 49-51 MG Take 1 tablet by mouth 2 (two) times  daily. 07/10/22   Bensimhon, Shaune Pascal, MD  warfarin (COUMADIN) 4 MG tablet Take 1 tablet (4 mg total) by mouth daily. 08/29/22   Elsie Stain, MD    Family History Family History  Family history unknown: Yes    Social History Social History   Tobacco Use   Smoking status: Never   Smokeless tobacco: Never  Vaping Use   Vaping Use: Never used  Substance Use Topics   Alcohol use: Not Currently    Comment: Occasional   Drug use: Not Currently    Types: Marijuana, Cocaine, Other-see comments    Comment: No cocaine since March. Edible Marijuana for  sleep- nightly     Allergies   Patient has no known allergies.   Review of Systems Review of Systems   Physical Exam Triage Vital Signs ED Triage Vitals  Enc Vitals Group     BP 10/27/22 1758 (!) 162/107     Pulse Rate 10/27/22 1758 94     Resp 10/27/22 1758 18     Temp 10/27/22 1758 98.2 F (36.8 C)     Temp src --      SpO2 10/27/22 1758 96 %     Weight --      Height --      Head Circumference --      Peak Flow --      Pain Score 10/27/22 1756 0     Pain Loc --      Pain Edu? --      Excl. in [place]? --    No data found.  Updated Vital Signs BP (!) 162/107 (BP Location: Left Arm)   Pulse 94   Temp 98.2 F (36.8 C)   Resp 18   SpO2 96%   Visual Acuity Right Eye Distance:   Left Eye Distance:   Bilateral Distance:    Right Eye Near:   Left Eye Near:    Bilateral Near:     Physical Exam Vitals reviewed.  Constitutional:      General: She is not in acute distress.    Appearance: She is not ill-appearing, toxic-appearing or diaphoretic.  HENT:     Nose: Nose normal.     Mouth/Throat:     Mouth: Mucous membranes are moist.  Eyes:     Extraocular Movements: Extraocular movements intact.     Pupils: Pupils are equal, round, and reactive to light.     Comments: There is an erythematous spot about 1 x 1/2 cm in the lateral left conjunctiva.  There is no discharge on the lids or in the canthi.  The lids are not swollen.  Cardiovascular:     Rate and Rhythm: Normal rate and regular rhythm.  Pulmonary:     Effort: Pulmonary effort is normal.     Breath sounds: Normal breath sounds.  Skin:    Coloration: Skin is not jaundiced or pale.  Neurological:     General: No focal deficit present.     Mental Status: She is alert.  Psychiatric:        Behavior: Behavior normal.      UC Treatments / Results  Labs (all labs ordered are listed, but only abnormal results are displayed) Labs Reviewed  PROTIME-INR    EKG   Radiology No results  found.  Procedures Procedures (including critical care time)  Medications Ordered in UC Medications - No data to display  Initial Impression / Assessment and Plan / UC Course  I have reviewed the  triage vital signs and the nursing notes.  Pertinent labs & imaging results that were available during my care of the patient were reviewed by me and considered in my medical decision making (see chart for details).        Pro time is drawn today.  She has MyChart, and if she sees that her INR is greater than 3.5 she will call her cardiologist (they manage her warfarin) for recommendations. Final Clinical Impressions(s) / UC Diagnoses   Final diagnoses:  Subconjunctival hemorrhage of left eye     Discharge Instructions      We have drawn blood to check your pro time and INR.  If you see on my chart that your INR is greater than 3.5, please call your cardiology office to speak to someone on call.  They should be able to help you decide what to do next and manage your warfarin dosing.     ED Prescriptions   None    PDMP not reviewed this encounter.   Barrett Henle, MD 10/27/22 [place]    Barrett Henle, MD 10/27/22 [phone]

## 2022-10-27 NOTE — ED Triage Notes (Signed)
Pt reports had blood vessel in left eye to bust this afternoon and since on Warfarin told to be seen. Pt denies pain, injury, or visual impairment.

## 2022-10-27 NOTE — Discharge Instructions (Signed)
We have drawn blood to check your pro time and INR.  If you see on my chart that your INR is greater than 3.5, please call your cardiology office to speak to someone on call.  They should be able to help you decide what to do next and manage your warfarin dosing.

## 2022-10-27 NOTE — Telephone Encounter (Signed)
  Chief Complaint: Broken blood vessel in eye Symptoms: above Frequency: today Pertinent Negatives: Patient denies  Disposition: [] ED /[x] Urgent Care (no appt availability in office) / [] Appointment(In office/virtual)/ []  [facility]/ [] Home Care/ [] Refused Recommended Disposition /[] [facility]/ []  Follow-up with PCP Additional Notes: PT noticed a broken blood vessel in her left eye today. Pt is on coumadin. PT will go to UC for evaluation.    Reason for Disposition  [1] Bleeding on white of the eye AND [2] taking Coumadin (warfarin) or other strong blood thinner, or known bleeding disorder (e.g., thrombocytopenia)  Answer Assessment - Initial Assessment Questions 1. LOCATION: Location: "What's red, the eyeball or the outer eyelids?" (Note: when callers say the eye is red, they usually mean the sclera is red)       Broken blood vessel 2. REDNESS OF SCLERA: "Is the redness in one or both eyes?" "When did the redness start?"      Left eye 3. ONSET: "When did the eye become red?" (e.g., hours, days)      This morning 4. EYELIDS: "Are the eyelids red or swollen?" If Yes, ask: "How much?"      no 5. VISION: "Is there any difficulty seeing clearly?"      no 6. ITCHING: "Does it feel itchy?" If so ask: "How bad is it" (e.g., Scale 1-10; or mild, moderate, severe)     no 7. PAIN: "Is there any pain? If Yes, ask: "How bad is it?" (e.g., Scale 1-10; or mild, moderate, severe)     no 8. CONTACT LENS: "Do you wear contacts?"     no 9. CAUSE: "What do you think is causing the redness?"     Broken blood vessels 10. OTHER SYMPTOMS: "Do you have any other symptoms?" (e.g., fever, runny nose, cough, vomiting)  Protocols used: Eye - Red Without Pus-A-AH

## 2022-10-30 ENCOUNTER — Telehealth (HOSPITAL_COMMUNITY): Payer: Self-pay

## 2022-10-30 NOTE — Telephone Encounter (Signed)
Called pt to confirm appt for Cardiac Rehab on 11/01/22 at 1000. Spoke with daughter/ emergency contact Arnoldo Lenis. Reviewed instructions for visit and expectations, taking meds/eating before coming, wearing comfortable clothing, and where to find department. All questions answered, daughter voiced understanding.   Colbert Ewing, MS 10/30/2022 5:16 PM

## 2022-11-01 ENCOUNTER — Encounter (HOSPITAL_COMMUNITY)
Admission: RE | Admit: 2022-11-01 | Discharge: 2022-11-01 | Disposition: A | Discharge status: Home / Self Care | Payer: Medicaid Other | Source: Ambulatory Visit | Attending: Internal Medicine | Admitting: Internal Medicine

## 2022-11-01 VITALS — BP 142/88 | HR 68 | Ht 66.0 in | Wt 173.5 lb

## 2022-11-01 DIAGNOSIS — Z48812 Encounter for surgical aftercare following surgery on the circulatory system: Secondary | ICD-10-CM | POA: Diagnosis not present

## 2022-11-01 DIAGNOSIS — Z952 Presence of prosthetic heart valve: Secondary | ICD-10-CM | POA: Insufficient documentation

## 2022-11-01 DIAGNOSIS — Z9889 Other specified postprocedural states: Secondary | ICD-10-CM | POA: Diagnosis not present

## 2022-11-01 NOTE — Progress Notes (Signed)
Cardiac Individual Treatment Plan  Patient Details  Name: Lisa Ayala MRN: [id number] Date of Birth: 06/30/1966 Referring Provider:   Flowsheet Row CARDIAC REHAB PHASE II ORIENTATION from 11/01/2022 in [facility] [place]  Referring Provider Glori Bickers, MD       Initial Encounter Date:  [place] PHASE II ORIENTATION from 11/01/2022 in [facility]  Date 11/01/22       Visit Diagnosis: S/P mitral valve replacement  S/P tricuspid valve repair  Patient's Home Medications on Admission:  Current Outpatient Medications:    aspirin 81 MG chewable tablet, Chew 1 tablet (81 mg total) by mouth daily., Disp: 100 tablet, Rfl: 2   dapagliflozin propanediol (FARXIGA) 10 MG TABS tablet, Take 1 tablet (10 mg total) by mouth daily., Disp: 90 tablet, Rfl: 3   magnesium oxide (MAG-OX) 400 MG tablet, Take 1 tablet (400 mg total) by mouth 2 (two) times daily. (Patient not taking: Reported on 10/25/2022), Disp: 60 tablet, Rfl: 0   Multiple Vitamin (MULTIVITAMIN ADULT PO), Take 1 tablet by mouth daily., Disp: , Rfl:    polyethylene glycol powder (GLYCOLAX/MIRALAX) 17 GM/SCOOP powder, Take 17 g by mouth daily dissolved in water., Disp: 238 g, Rfl: 0   sacubitril-valsartan (ENTRESTO) 49-51 MG, Take 1 tablet by mouth 2 (two) times daily., Disp: 60 tablet, Rfl: 3   warfarin (COUMADIN) 4 MG tablet, Take 1 tablet (4 mg total) by mouth daily., Disp: 30 tablet, Rfl: 2  Past Medical History: Past Medical History:  Diagnosis Date   Aortic insufficiency    Atrial flutter ([place]) 02/07/2022   GERD (gastroesophageal reflux disease)    Hypertension    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 08/2021 Echo: EF 70-75%, no rwma, mod dil LV, GrII DD, nl RV fxn, massively dil LA, mild-mod dil RA, severe MR  due to lack of central coaptation of MV leaflets. Mild MS. Mod TR. Mild-mod AI.   Tricuspid regurgitation     Tobacco Use: Social History   Tobacco Use  Smoking Status Never  Smokeless Tobacco Never    Labs: Review Flowsheet  More data exists      Latest Ref Rng & Units 02/16/2022 02/17/2022 02/18/2022 02/19/2022 02/20/2022  Labs for ITP Cardiac and Pulmonary Rehab  Trlycerides <150 mg/dL - 109  - - 64   O2 Saturation % 71.1  66  87.2  70.2  -    Capillary Blood Glucose: Lab Results  Component Value Date   GLUCAP 99 02/22/2022   GLUCAP 150 (H) 02/20/2022   GLUCAP 99 02/20/2022   GLUCAP 90 02/20/2022   GLUCAP 140 (H) 02/20/2022     Exercise Target Goals: Exercise Program Goal: Individual exercise prescription set using results from initial 6 min walk test and THRR while considering  patient's activity barriers and safety.   Exercise Prescription Goal: Initial exercise prescription builds to 30-45 minutes a day of aerobic activity, 2-3 days per week.  Home exercise guidelines will be given to patient during program as part of exercise prescription that the participant will acknowledge.  Activity Barriers & Risk Stratification:  Activity Barriers & Cardiac Risk Stratification - 11/01/22 1304       Activity Barriers & Cardiac Risk Stratification   Activity Barriers Back Problems;Decreased Ventricular Function;Deconditioning;Assistive Device   pt has scoliosis, uses walker with long  distances   Cardiac Risk Stratification High   <5 METS on            6 Minute Walk:  6 Minute Walk     Row Name 11/01/22 1303         6 Minute Walk   Phase Initial     Distance 1504 feet     Walk Time 6 minutes     # of Rest Breaks 0     MPH 2.84     METS 4.1     RPE 11     Perceived Dyspnea  0     VO2 Peak 14.34     Symptoms No     Resting HR 68 bpm     Resting BP 142/88     Resting Oxygen Saturation  98 %     Exercise Oxygen Saturation  during 6 min walk 100 %      Max Ex. HR 102 bpm     Max Ex. BP 152/86     2 Minute Post BP 140/84              Oxygen Initial Assessment:   Oxygen Re-Evaluation:   Oxygen Discharge (Final Oxygen Re-Evaluation):   Initial Exercise Prescription:  Initial Exercise Prescription - 11/01/22 1300       Date of Initial Exercise RX and Referring Provider   Date 11/01/22    Referring Provider Arvilla Meres, MD    Expected Discharge Date 01/12/23      NuStep   Level 2    SPM 70    Minutes 15    METs 3      Track   Laps 17    Minutes 15    METs 3      Prescription Details   Frequency (times per week) 3    Duration Progress to 30 minutes of continuous aerobic without signs/symptoms of physical distress      Intensity   THRR 40-80% of Max Heartrate 65-131    Ratings of Perceived Exertion 11-13    Perceived Dyspnea 0-4      Progression   Progression Continue to progress workloads to maintain intensity without signs/symptoms of physical distress.      Resistance Training   Training Prescription Yes    Weight 2    Reps 10-15             Perform Capillary Blood Glucose checks as needed.  Exercise Prescription Changes:   Exercise Comments:   Exercise Goals and Review:   Exercise Goals     Row Name 11/01/22 1307             Exercise Goals   Increase Physical Activity Yes       Intervention Provide advice, education, support and counseling about physical activity/exercise needs.;Develop an individualized exercise prescription for aerobic and resistive training based on initial evaluation findings, risk stratification, comorbidities and participant's personal goals.       Expected Outcomes Short Term: Attend rehab on a regular basis to increase amount of physical activity.;Long Term: Exercising regularly at least 3-5 days a week.;Long Term: Add in home exercise to make exercise part of routine and to increase amount of physical activity.       Increase Strength and Stamina Yes        Intervention Provide advice, education, support and counseling about physical activity/exercise needs.;Develop an individualized exercise prescription for aerobic and resistive training based on initial evaluation findings, risk stratification, comorbidities and  participant's personal goals.       Expected Outcomes Short Term: Increase workloads from initial exercise prescription for resistance, speed, and METs.;Long Term: Improve cardiorespiratory fitness, muscular endurance and strength as measured by increased METs and functional capacity ( );Short Term: Perform resistance training exercises routinely during rehab and add in resistance training at home       Able to understand and use rate of perceived exertion (RPE) scale Yes       Intervention Provide education and explanation on how to use RPE scale       Expected Outcomes Short Term: Able to use RPE daily in rehab to express subjective intensity level;Long Term:  Able to use RPE to guide intensity level when exercising independently       Knowledge and understanding of Target Heart Rate Range (THRR) Yes       Intervention Provide education and explanation of THRR including how the numbers were predicted and where they are located for reference       Expected Outcomes Short Term: Able to state/look up THRR;Short Term: Able to use daily as guideline for intensity in rehab;Long Term: Able to use THRR to govern intensity when exercising independently       Understanding of Exercise Prescription Yes       Intervention Provide education, explanation, and written materials on patient's individual exercise prescription       Expected Outcomes Short Term: Able to explain program exercise prescription;Long Term: Able to explain home exercise prescription to exercise independently                Exercise Goals Re-Evaluation :   Discharge Exercise Prescription (Final Exercise Prescription Changes):   Nutrition:  Target Goals: Understanding  of nutrition guidelines, daily intake of sodium 1500mg , cholesterol 200mg , calories 30% from fat and 7% or less from saturated fats, daily to have 5 or more servings of fruits and vegetables.  Biometrics:  Pre Biometrics - 11/01/22 1055       Pre Biometrics   Waist Circumference 38 inches    Hip Circumference 41 inches    Waist to Hip Ratio 0.93 %    Triceps Skinfold 38 mm    % Body Fat 40.8 %    Grip Strength 30 kg    Flexibility 20 in    Single Leg Stand 22.06 seconds              Nutrition Therapy Plan and Nutrition Goals:   Nutrition Assessments:  MEDIFICTS Score Key: ?70 Need to make dietary changes  40-70 Heart Healthy Diet ? 40 Therapeutic Level Cholesterol Diet    Picture Your Plate Scores: <07 Unhealthy dietary pattern with much room for improvement. 41-50 Dietary pattern unlikely to meet recommendations for good health and room for improvement. 51-60 More healthful dietary pattern, with some room for improvement.  >60 Healthy dietary pattern, although there may be some specific behaviors that could be improved.    Nutrition Goals Re-Evaluation:   Nutrition Goals Re-Evaluation:   Nutrition Goals Discharge (Final Nutrition Goals Re-Evaluation):   Psychosocial: Target Goals: Acknowledge presence or absence of significant depression and/or stress, maximize coping skills, provide positive support system. Participant is able to verbalize types and ability to use techniques and skills needed for reducing stress and depression.  Initial Review & Psychosocial Screening:  Initial Psych Review & Screening - 11/01/22 1308       Initial Review   Current issues with Current Stress Concerns    Source of Stress Concerns  Chronic Illness;Financial;Occupation;Unable to participate in former interests or hobbies    Comments [place] Anaracy admits to having some stress in her life currently due to her recent heart diagnosis and financial reasons. French Anaracy shared that she has not  worked since 2022 and has been living on disability. This has caused stress with paying for bills and living expenses. French Anaracy also shared that her recent diagnosis has kept her from doing things she enjoyed prior to surgery. Reassurance and support was offered to [place], she denied any further need for resources.      Family Dynamics   Good Support System? Yes   French Anaracy has her daughter for support     Barriers   Psychosocial barriers to participate in program The patient should benefit from training in stress management and relaxation.      Screening Interventions   Interventions Encouraged to exercise;To provide support and resources with identified psychosocial needs;Provide feedback about the scores to participant    Expected Outcomes Long Term Goal: Stressors or current issues are controlled or eliminated.;Short Term goal: Identification and review with participant of any Quality of Life or Depression concerns found by scoring the questionnaire.;Long Term goal: The participant improves quality of Life and PHQ9 Scores as seen by post scores and/or verbalization of changes             Quality of Life Scores:  Quality of Life - 11/01/22 1313       Quality of Life   Select Quality of Life      Quality of Life Scores   Health/Function Pre 3.69 %    Socioeconomic Pre 18 %    Psych/Spiritual Pre 6.86 %    Family Pre 16 %    GLOBAL Pre 8.69 %            Scores of 19 and below usually indicate a poorer quality of life in these areas.  A difference of  2-3 points is a clinically meaningful difference.  A difference of 2-3 points in the total score of the Quality of Life Index has been associated with significant improvement in overall quality of life, self-image, physical symptoms, and general health in studies assessing change in quality of life.  PHQ-9: Review Flowsheet       11/01/2022 08/09/2022 06/07/2022 04/11/2022  Depression screen PHQ 2/9  Decreased Interest 0 0 0 0  Down,  Depressed, Hopeless 1 0 0 0  PHQ - 2 Score 1 0 0 0  Altered sleeping 0 - 0 -  Tired, decreased energy 1 - 0 -  Change in appetite 0 - 0 -  Feeling bad or failure about yourself  1 - 0 -  Trouble concentrating 0 - 0 -  Moving slowly or fidgety/restless 0 - 0 -  Suicidal thoughts 0 - 0 -  PHQ-9 Score 3 - 0 -  Difficult doing work/chores Somewhat difficult - Not difficult at all -   Interpretation of Total Score  Total Score Depression Severity:  1-4 = Minimal depression, 5-9 = Mild depression, 10-14 = Moderate depression, 15-19 = Moderately severe depression, 20-27 = Severe depression   Psychosocial Evaluation and Intervention:   Psychosocial Re-Evaluation:   Psychosocial Discharge (Final Psychosocial Re-Evaluation):   Vocational Rehabilitation: Provide vocational rehab assistance to qualifying candidates.   Vocational Rehab Evaluation & Intervention:  Vocational Rehab - 11/01/22 1314       Initial Vocational Rehab Evaluation & Intervention   Assessment shows need for Vocational Rehabilitation No   French Anaracy has not  worked since 2022, however she denies any need for vocational rehab at this time            Education: Education Goals: Education classes will be provided on a weekly basis, covering required topics. Participant will state understanding/return demonstration of topics presented.     Core Videos: Exercise    Move It!  Clinical staff conducted group or individual video education with verbal and written material and guidebook.  Patient learns the recommended Pritikin exercise program. Exercise with the goal of living a long, healthy life. Some of the health benefits of exercise include controlled diabetes, healthier blood pressure levels, improved cholesterol levels, improved heart and lung capacity, improved sleep, and better body composition. Everyone should speak with their doctor before starting or changing an exercise routine.  Biomechanical  Limitations Clinical staff conducted group or individual video education with verbal and written material and guidebook.  Patient learns how biomechanical limitations can impact exercise and how we can mitigate and possibly overcome limitations to have an impactful and balanced exercise routine.  Body Composition Clinical staff conducted group or individual video education with verbal and written material and guidebook.  Patient learns that body composition (ratio of muscle mass to fat mass) is a key component to assessing overall fitness, rather than body weight alone. Increased fat mass, especially visceral belly fat, can put [place] at increased risk for metabolic syndrome, type 2 diabetes, heart disease, and even death. It is recommended to combine diet and exercise (cardiovascular and resistance training) to improve your body composition. Seek guidance from your physician and exercise physiologist before implementing an exercise routine.  Exercise Action Plan Clinical staff conducted group or individual video education with verbal and written material and guidebook.  Patient learns the recommended strategies to achieve and enjoy long-term exercise adherence, including variety, self-motivation, self-efficacy, and positive decision making. Benefits of exercise include fitness, good health, weight management, more energy, better sleep, less stress, and overall well-being.  Medical   Heart Disease Risk Reduction Clinical staff conducted group or individual video education with verbal and written material and guidebook.  Patient learns our heart is our most vital organ as it circulates oxygen, nutrients, white blood cells, and hormones throughout the entire body, and carries waste away. Data supports a plant-based eating plan like the Pritikin Program for its effectiveness in slowing progression of and reversing heart disease. The video provides a number of recommendations to address heart  disease.   Metabolic Syndrome and Belly Fat  Clinical staff conducted group or individual video education with verbal and written material and guidebook.  Patient learns what metabolic syndrome is, how it leads to heart disease, and how one can reverse it and keep it from coming back. You have metabolic syndrome if you have 3 of the following 5 criteria: abdominal obesity, high blood pressure, high triglycerides, low HDL cholesterol, and high blood sugar.  Hypertension and Heart Disease Clinical staff conducted group or individual video education with verbal and written material and guidebook.  Patient learns that high blood pressure, or hypertension, is very common in the [place]. Hypertension is largely due to excessive salt intake, but other important risk factors include being overweight, physical inactivity, drinking too much alcohol, smoking, and not eating enough potassium from fruits and vegetables. High blood pressure is a leading risk factor for heart attack, stroke, congestive heart failure, dementia, kidney failure, and premature death. Long-term effects of excessive salt intake include stiffening of the arteries and thickening of heart muscle  and organ damage. Recommendations include ways to reduce hypertension and the risk of heart disease.  Diseases of Our Time - Focusing on Diabetes Clinical staff conducted group or individual video education with verbal and written material and guidebook.  Patient learns why the best way to stop diseases of our time is prevention, through food and other lifestyle changes. Medicine (such as prescription pills and surgeries) is often only a Band-Aid on the problem, not a long-term solution. Most common diseases of our time include obesity, type 2 diabetes, hypertension, heart disease, and cancer. The Pritikin Program is recommended and has been proven to help reduce, reverse, and/or prevent the damaging effects of metabolic syndrome.  Nutrition    Overview of the Pritikin Eating Plan  Clinical staff conducted group or individual video education with verbal and written material and guidebook.  Patient learns about the Pritikin Eating Plan for disease risk reduction. The Pritikin Eating Plan emphasizes a wide variety of unrefined, minimally-processed carbohydrates, like fruits, vegetables, whole grains, and legumes. Go, Caution, and Stop food choices are explained. Plant-based and lean animal proteins are emphasized. Rationale provided for low sodium intake for blood pressure control, low added sugars for blood sugar stabilization, and low added fats and oils for coronary artery disease risk reduction and weight management.  Calorie Density  Clinical staff conducted group or individual video education with verbal and written material and guidebook.  Patient learns about calorie density and how it impacts the Pritikin Eating Plan. Knowing the characteristics of the food you choose will help you decide whether those foods will lead to weight gain or weight loss, and whether you want to consume more or less of them. Weight loss is usually a side effect of the Pritikin Eating Plan because of its focus on low calorie-dense foods.  Label Reading  Clinical staff conducted group or individual video education with verbal and written material and guidebook.  Patient learns about the Pritikin recommended label reading guidelines and corresponding recommendations regarding calorie density, added sugars, sodium content, and whole grains.  Dining Out - Part 1  Clinical staff conducted group or individual video education with verbal and written material and guidebook.  Patient learns that restaurant meals can be sabotaging because they can be so high in calories, fat, sodium, and/or sugar. Patient learns recommended strategies on how to positively address this and avoid unhealthy pitfalls.  Facts on Fats  Clinical staff conducted group or individual video  education with verbal and written material and guidebook.  Patient learns that lifestyle modifications can be just as effective, if not more so, as many medications for lowering your risk of heart disease. A Pritikin lifestyle can help to reduce your risk of inflammation and atherosclerosis (cholesterol build-up, or plaque, in the artery walls). Lifestyle interventions such as dietary choices and physical activity address the cause of atherosclerosis. A review of the types of fats and their impact on blood cholesterol levels, along with dietary recommendations to reduce fat intake is also included.  Nutrition Action Plan  Clinical staff conducted group or individual video education with verbal and written material and guidebook.  Patient learns how to incorporate Pritikin recommendations into their lifestyle. Recommendations include planning and keeping personal health goals in mind as an important part of their success.  Healthy Mind-Set    Healthy Minds, Bodies, Hearts  Clinical staff conducted group or individual video education with verbal and written material and guidebook.  Patient learns how to identify when they are stressed. Video will discuss  the impact of that stress, as well as the many benefits of stress management. Patient will also be introduced to stress management techniques. The way we think, act, and feel has an impact on our hearts.  How Our Thoughts Can Heal Our Hearts  Clinical staff conducted group or individual video education with verbal and written material and guidebook.  Patient learns that negative thoughts can cause depression and anxiety. This can result in negative lifestyle behavior and serious health problems. Cognitive behavioral therapy is an effective method to help control our thoughts in order to change and improve our emotional outlook.  Additional Videos:  Exercise    Improving Performance  Clinical staff conducted group or individual video education with  verbal and written material and guidebook.  Patient learns to use a non-linear approach by alternating intensity levels and lengths of time spent exercising to help burn more calories and lose more body fat. Cardiovascular exercise helps improve heart health, metabolism, hormonal balance, blood sugar control, and recovery from fatigue. Resistance training improves strength, endurance, balance, coordination, reaction time, metabolism, and muscle mass. Flexibility exercise improves circulation, posture, and balance. Seek guidance from your physician and exercise physiologist before implementing an exercise routine and learn your capabilities and proper form for all exercise.  Introduction to Yoga  Clinical staff conducted group or individual video education with verbal and written material and guidebook.  Patient learns about yoga, a discipline of the coming together of mind, breath, and body. The benefits of yoga include improved flexibility, improved range of motion, better posture and core strength, increased lung function, weight loss, and positive self-image. Yoga's heart health benefits include lowered blood pressure, healthier heart rate, decreased cholesterol and triglyceride levels, improved immune function, and reduced stress. Seek guidance from your physician and exercise physiologist before implementing an exercise routine and learn your capabilities and proper form for all exercise.  Medical   Aging: Enhancing Your Quality of Life  Clinical staff conducted group or individual video education with verbal and written material and guidebook.  Patient learns key strategies and recommendations to stay in good physical health and enhance quality of life, such as prevention strategies, having an advocate, securing a Health Care Proxy and Power of Attorney, and keeping a list of medications and system for tracking them. It also discusses how to avoid risk for bone loss.  Biology of Weight Control   Clinical staff conducted group or individual video education with verbal and written material and guidebook.  Patient learns that weight gain occurs because we consume more calories than we burn (eating more, moving less). Even if your body weight is normal, you may have higher ratios of fat compared to muscle mass. Too much body fat puts you at increased risk for cardiovascular disease, heart attack, stroke, type 2 diabetes, and obesity-related cancers. In addition to exercise, following the Pritikin Eating Plan can help reduce your risk.  Decoding Lab Results  Clinical staff conducted group or individual video education with verbal and written material and guidebook.  Patient learns that lab test reflects one measurement whose values change over time and are influenced by many factors, including medication, stress, sleep, exercise, food, hydration, pre-existing medical conditions, and more. It is recommended to use the knowledge from this video to become more involved with your lab results and evaluate your numbers to speak with your doctor.   Diseases of Our Time - Overview  Clinical staff conducted group or individual video education with verbal and written material and guidebook.  Patient learns that according to the CDC, 50% to 70% of chronic diseases (such as obesity, type 2 diabetes, elevated lipids, hypertension, and heart disease) are avoidable through lifestyle improvements including healthier food choices, listening to satiety cues, and increased physical activity.  Sleep Disorders Clinical staff conducted group or individual video education with verbal and written material and guidebook.  Patient learns how good quality and duration of sleep are important to overall health and well-being. Patient also learns about sleep disorders and how they impact health along with recommendations to address them, including discussing with a physician.  Nutrition  Dining Out - Part 2 Clinical staff  conducted group or individual video education with verbal and written material and guidebook.  Patient learns how to plan ahead and communicate in order to maximize their dining experience in a healthy and nutritious manner. Included are recommended food choices based on the type of restaurant the patient is visiting.   Fueling a BankerHealthy Body  Clinical staff conducted group or individual video education with verbal and written material and guidebook.  There is a strong connection between our food choices and our health. Diseases like obesity and type 2 diabetes are very prevalent and are in large-part due to lifestyle choices. The Pritikin Eating Plan provides plenty of food and hunger-curbing satisfaction. It is easy to follow, affordable, and helps reduce health risks.  Menu Workshop  Clinical staff conducted group or individual video education with verbal and written material and guidebook.  Patient learns that restaurant meals can sabotage health goals because they are often packed with calories, fat, sodium, and sugar. Recommendations include strategies to plan ahead and to communicate with the manager, chef, or server to help order a healthier meal.  Planning Your Eating Strategy  Clinical staff conducted group or individual video education with verbal and written material and guidebook.  Patient learns about the Pritikin Eating Plan and its benefit of reducing the risk of disease. The Pritikin Eating Plan does not focus on calories. Instead, it emphasizes high-quality, nutrient-rich foods. By knowing the characteristics of the foods, we choose, we can determine their calorie density and make informed decisions.  Targeting Your Nutrition Priorities  Clinical staff conducted group or individual video education with verbal and written material and guidebook.  Patient learns that lifestyle habits have a tremendous impact on disease risk and progression. This video provides eating and physical  activity recommendations based on your personal health goals, such as reducing LDL cholesterol, losing weight, preventing or controlling type 2 diabetes, and reducing high blood pressure.  Vitamins and Minerals  Clinical staff conducted group or individual video education with verbal and written material and guidebook.  Patient learns different ways to obtain key vitamins and minerals, including through a recommended healthy diet. It is important to discuss all supplements you take with your doctor.   Healthy Mind-Set    Smoking Cessation  Clinical staff conducted group or individual video education with verbal and written material and guidebook.  Patient learns that cigarette smoking and tobacco addiction pose a serious health risk which affects millions of people. Stopping smoking will significantly reduce the risk of heart disease, lung disease, and many forms of cancer. Recommended strategies for quitting are covered, including working with your doctor to develop a successful plan.  Culinary   Becoming a Set designerritikin Chef  Clinical staff conducted group or individual video education with verbal and written material and guidebook.  Patient learns that cooking at home can be healthy, cost-effective, quick, and  puts them in control. Keys to cooking healthy recipes will include looking at your recipe, assessing your equipment needs, planning ahead, making it simple, choosing cost-effective seasonal ingredients, and limiting the use of added fats, salts, and sugars.  Cooking - Breakfast and Snacks  Clinical staff conducted group or individual video education with verbal and written material and guidebook.  Patient learns how important breakfast is to satiety and nutrition through the entire day. Recommendations include key foods to eat during breakfast to help stabilize blood sugar levels and to prevent overeating at meals later in the day. Planning ahead is also a key component.  Cooking - Psychologist, educational conducted group or individual video education with verbal and written material and guidebook.  Patient learns eating strategies to improve overall health, including an approach to cook more at home. Recommendations include thinking of animal protein as a side on your plate rather than center stage and focusing instead on lower calorie dense options like vegetables, fruits, whole grains, and plant-based proteins, such as beans. Making sauces in large quantities to freeze for later and leaving the skin on your vegetables are also recommended to maximize your experience.  Cooking - Healthy Salads and Dressing Clinical staff conducted group or individual video education with verbal and written material and guidebook.  Patient learns that vegetables, fruits, whole grains, and legumes are the foundations of the Pritikin Eating Plan. Recommendations include how to incorporate each of these in flavorful and healthy salads, and how to create homemade salad dressings. Proper handling of ingredients is also covered. Cooking - Soups and [employer] - Soups and Desserts Clinical staff conducted group or individual video education with verbal and written material and guidebook.  Patient learns that Pritikin soups and desserts make for easy, nutritious, and delicious snacks and meal components that are low in sodium, fat, sugar, and calorie density, while high in vitamins, minerals, and filling fiber. Recommendations include simple and healthy ideas for soups and desserts.   Overview     The Pritikin Solution Program Overview Clinical staff conducted group or individual video education with verbal and written material and guidebook.  Patient learns that the results of the Pritikin Program have been documented in more than 100 articles published in peer-reviewed journals, and the benefits include reducing risk factors for (and, in some cases, even reversing) high cholesterol, high  blood pressure, type 2 diabetes, obesity, and more! An overview of the three key pillars of the Pritikin Program will be covered: eating well, doing regular exercise, and having a healthy mind-set.  WORKSHOPS  Exercise: Exercise Basics: Building Your Action Plan Clinical staff led group instruction and group discussion with PowerPoint presentation and patient guidebook. To enhance the learning environment the use of posters, models and videos may be added. At the conclusion of this workshop, patients will comprehend the difference between physical activity and exercise, as well as the benefits of incorporating both, into their routine. Patients will understand the FITT (Frequency, Intensity, Time, and Type) principle and how to use it to build an exercise action plan. In addition, safety concerns and other considerations for exercise and cardiac rehab will be addressed by the presenter. The purpose of this lesson is to promote a comprehensive and effective weekly exercise routine in order to improve patients' overall level of fitness.   Managing Heart Disease: Your Path to a Healthier Heart Clinical staff led group instruction and group discussion with PowerPoint presentation and patient guidebook. To enhance the  learning environment the use of posters, models and videos may be added.At the conclusion of this workshop, patients will understand the anatomy and physiology of the heart. Additionally, they will understand how Pritikin's three pillars impact the risk factors, the progression, and the management of heart disease.  The purpose of this lesson is to provide a high-level overview of the heart, heart disease, and how the Pritikin lifestyle positively impacts risk factors.  Exercise Biomechanics Clinical staff led group instruction and group discussion with PowerPoint presentation and patient guidebook. To enhance the learning environment the use of posters, models and videos may be added.  Patients will learn how the structural parts of their bodies function and how these functions impact their daily activities, movement, and exercise. Patients will learn how to promote a neutral spine, learn how to manage pain, and identify ways to improve their physical movement in order to promote healthy living. The purpose of this lesson is to expose patients to common physical limitations that impact physical activity. Participants will learn practical ways to adapt and manage aches and pains, and to minimize their effect on regular exercise. Patients will learn how to maintain good posture while sitting, walking, and lifting.  Balance Training and Fall Prevention  Clinical staff led group instruction and group discussion with PowerPoint presentation and patient guidebook. To enhance the learning environment the use of posters, models and videos may be added. At the conclusion of this workshop, patients will understand the importance of their sensorimotor skills (vision, proprioception, and the vestibular system) in maintaining their ability to balance as they age. Patients will apply a variety of balancing exercises that are appropriate for their current level of function. Patients will understand the common causes for poor balance, possible solutions to these problems, and ways to modify their physical environment in order to minimize their fall risk. The purpose of this lesson is to teach patients about the importance of maintaining balance as they age and ways to minimize their risk of falling.  WORKSHOPS   Nutrition:  Fueling a Scientist, research (physical sciences) led group instruction and group discussion with PowerPoint presentation and patient guidebook. To enhance the learning environment the use of posters, models and videos may be added. Patients will review the foundational principles of the [place] and understand what constitutes a serving size in each of the food groups.  Patients will also learn Pritikin-friendly foods that are better choices when away from home and review make-ahead meal and snack options. Calorie density will be reviewed and applied to three nutrition priorities: weight maintenance, weight loss, and weight gain. The purpose of this lesson is to reinforce (in a group setting) the key concepts around what patients are recommended to eat and how to apply these guidelines when away from home by planning and selecting Pritikin-friendly options. Patients will understand how calorie density may be adjusted for different weight management goals.  Mindful Eating  Clinical staff led group instruction and group discussion with PowerPoint presentation and patient guidebook. To enhance the learning environment the use of posters, models and videos may be added. Patients will briefly review the concepts of the [place] and the importance of low-calorie dense foods. The concept of mindful eating will be introduced as well as the importance of paying attention to internal hunger signals. Triggers for non-hunger eating and techniques for dealing with triggers will be explored. The purpose of this lesson is to provide patients with the opportunity to review the basic principles of  the Pritikin Eating Plan, discuss the value of eating mindfully and how to measure internal cues of hunger and fullness using the Hunger Scale. Patients will also discuss reasons for non-hunger eating and learn strategies to use for controlling emotional eating.  Targeting Your Nutrition Priorities Clinical staff led group instruction and group discussion with PowerPoint presentation and patient guidebook. To enhance the learning environment the use of posters, models and videos may be added. Patients will learn how to determine their genetic susceptibility to disease by reviewing their family history. Patients will gain insight into the importance of diet as part of an overall healthy  lifestyle in mitigating the impact of genetics and other environmental insults. The purpose of this lesson is to provide patients with the opportunity to assess their personal nutrition priorities by looking at their family history, their own health history and current risk factors. Patients will also be able to discuss ways of prioritizing and modifying the Pritikin Eating Plan for their highest risk areas  Menu  Clinical staff led group instruction and group discussion with PowerPoint presentation and patient guidebook. To enhance the learning environment the use of posters, models and videos may be added. Using menus brought in from [employer], or printed from [employer], patients will apply the Pritikin dining out guidelines that were presented in the [employer] video. Patients will also be able to practice these guidelines in a variety of provided scenarios. The purpose of this lesson is to provide patients with the opportunity to practice hands-on learning of the Pritikin Dining Out guidelines with actual menus and practice scenarios.  Label Reading Clinical staff led group instruction and group discussion with PowerPoint presentation and patient guidebook. To enhance the learning environment the use of posters, models and videos may be added. Patients will review and discuss the Pritikin label reading guidelines presented in Pritikin's Label Reading Educational series video. Using fool labels brought in from local grocery stores and markets, patients will apply the label reading guidelines and determine if the packaged food meet the Pritikin guidelines. The purpose of this lesson is to provide patients with the opportunity to review, discuss, and practice hands-on learning of the Pritikin Label Reading guidelines with actual packaged food labels. Cooking School  Pritikin's [employer] are designed to teach patients ways to prepare quick, simple, and  affordable recipes at home. The importance of nutrition's role in chronic disease risk reduction is reflected in its emphasis in the overall Pritikin program. By learning how to prepare essential core Pritikin Eating Plan recipes, patients will increase control over what they eat; be able to customize the flavor of foods without the use of added salt, sugar, or fat; and improve the quality of the food they consume. By learning a set of core recipes which are easily assembled, quickly prepared, and affordable, patients are more likely to prepare more healthy foods at home. These workshops focus on convenient breakfasts, simple entres, side dishes, and desserts which can be prepared with minimal effort and are consistent with nutrition recommendations for cardiovascular risk reduction. Cooking [employer] are taught by a Armed forces logistics/support/administrative officer (RD) who has been trained by the [employer]. The chef or RD has a clear understanding of the importance of minimizing - if not completely eliminating - added fat, sugar, and sodium in recipes. Throughout the series of Cooking School Workshop sessions, patients will learn about healthy ingredients and efficient methods of cooking to build confidence in their capability  to prepare    Cooking School weekly topics:  Adding Flavor- Sodium-Free  Fast and Healthy Breakfasts  Powerhouse Plant-Based Proteins  Satisfying Salads and Dressings  Simple Sides and Sauces  International Cuisine-Spotlight on the Blue Zones  Delicious Desserts  Savory Soups  Efficiency Cooking - Meals in a Snap  Tasty Appetizers and Snacks  Comforting Weekend Breakfasts  One-Pot Wonders   Fast Evening Meals  Easy Entertaining  Personalizing Your Pritikin Plate  WORKSHOPS   Healthy Mindset (Psychosocial): New Thoughts, New Behaviors Clinical staff led group instruction and group discussion with PowerPoint presentation and patient guidebook. To enhance the  learning environment the use of posters, models and videos may be added. Patients will learn and practice techniques for developing effective health and lifestyle goals. Patients will be able to effectively apply the goal setting process learned to develop at least one new personal goal.  The purpose of this lesson is to expose patients to a new skill set of behavior modification techniques such as techniques setting SMART goals, overcoming barriers, and achieving new thoughts and new behaviors.  Managing Moods and Relationships Clinical staff led group instruction and group discussion with PowerPoint presentation and patient guidebook. To enhance the learning environment the use of posters, models and videos may be added. Patients will learn how emotional and chronic stress factors can impact their health and relationships. They will learn healthy ways to manage their moods and utilize positive coping mechanisms. In addition, ICR patients will learn ways to improve communication skills. The purpose of this lesson is to expose patients to ways of understanding how one's mood and health are intimately connected. Developing a healthy outlook can help build positive relationships and connections with others. Patients will understand the importance of utilizing effective communication skills that include actively listening and being heard. They will learn and understand the importance of the "4 Cs" and especially Connections in fostering of a Healthy Mind-Set.  Healthy Sleep for a Healthy Heart Clinical staff led group instruction and group discussion with PowerPoint presentation and patient guidebook. To enhance the learning environment the use of posters, models and videos may be added. At the conclusion of this workshop, patients will be able to demonstrate knowledge of the importance of sleep to overall health, well-being, and quality of life. They will understand the symptoms of, and treatments for, common  sleep disorders. Patients will also be able to identify daytime and nighttime behaviors which impact sleep, and they will be able to apply these tools to help manage sleep-related challenges. The purpose of this lesson is to provide patients with a general overview of sleep and outline the importance of quality sleep. Patients will learn about a few of the most common sleep disorders. Patients will also be introduced to the concept of "sleep hygiene," and discover ways to self-manage certain sleeping problems through simple daily behavior changes. Finally, the workshop will motivate patients by clarifying the links between quality sleep and their goals of heart-healthy living.   Recognizing and Reducing Stress Clinical staff led group instruction and group discussion with PowerPoint presentation and patient guidebook. To enhance the learning environment the use of posters, models and videos may be added. At the conclusion of this workshop, patients will be able to understand the types of stress reactions, differentiate between acute and chronic stress, and recognize the impact that chronic stress has on their health. They will also be able to apply different coping mechanisms, such as reframing negative self-talk. Patients will have the opportunity  to practice a variety of stress management techniques, such as deep abdominal breathing, progressive muscle relaxation, and/or guided imagery.  The purpose of this lesson is to educate patients on the role of stress in their lives and to provide healthy techniques for coping with it.  Learning Barriers/Preferences:  Learning Barriers/Preferences - 11/01/22 1314       Learning Barriers/Preferences   Learning Barriers None    Learning Preferences Computer/Internet;Audio;Group Instruction;Individual Instruction;Pictoral;Skilled Demonstration;Video;Written Material;Verbal Instruction             Education Topics:  Knowledge Questionnaire Score:   Knowledge Questionnaire Score - 11/01/22 1314       Knowledge Questionnaire Score   Pre Score 19/24             Core Components/Risk Factors/Patient Goals at Admission:  Personal Goals and Risk Factors at Admission - 11/01/22 1315       Core Components/Risk Factors/Patient Goals on Admission    Weight Management Yes;Weight Loss    Intervention Weight Management: Develop a combined nutrition and exercise program designed to reach desired caloric intake, while maintaining appropriate intake of nutrient and fiber, sodium and fats, and appropriate energy expenditure required for the weight goal.;Weight Management: Provide education and appropriate resources to help participant work on and attain dietary goals.    Admit Weight 173 lb 8 oz (78.7 kg)    Goal Weight: Short Term 155 lb (70.3 kg)    Expected Outcomes Short Term: Continue to assess and modify interventions until short term weight is achieved;Long Term: Adherence to nutrition and physical activity/exercise program aimed toward attainment of established weight goal;Weight Loss: Understanding of general recommendations for a balanced deficit meal plan, which promotes 1-2 lb weight loss per week and includes a negative energy balance of [phone] kcal/d;Understanding of distribution of calorie intake throughout the day with the consumption of 4-5 meals/snacks;Understanding recommendations for meals to include 15-35% energy as protein, 25-35% energy from fat, 35-60% energy from carbohydrates, less than 200mg  of dietary cholesterol, 20-35 gm of total fiber daily    Heart Failure Yes    Intervention Provide a combined exercise and nutrition program that is supplemented with education, support and counseling about heart failure. Directed toward relieving symptoms such as shortness of breath, decreased exercise tolerance, and extremity edema.    Expected Outcomes Improve functional capacity of life;Short term: Attendance in program 2-3 days a  week with increased exercise capacity. Reported lower sodium intake. Reported increased fruit and vegetable intake. Reports medication compliance.;Short term: Daily weights obtained and reported for increase. Utilizing diuretic protocols set by physician.;Long term: Adoption of self-care skills and reduction of barriers for early signs and symptoms recognition and intervention leading to self-care maintenance.    Hypertension Yes    Intervention Provide education on lifestyle modifcations including regular physical activity/exercise, weight management, moderate sodium restriction and increased consumption of fresh fruit, vegetables, and low fat dairy, alcohol moderation, and smoking cessation.;Monitor prescription use compliance.    Expected Outcomes Short Term: Continued assessment and intervention until BP is < 140/44mm HG in hypertensive participants. < 130/10mm HG in hypertensive participants with diabetes, heart failure or chronic kidney disease.;Long Term: Maintenance of blood pressure at goal levels.    Stress Yes    Intervention Offer individual and/or small group education and counseling on adjustment to heart disease, stress management and health-related lifestyle change. Teach and support self-help strategies.;Refer participants experiencing significant psychosocial distress to appropriate mental health specialists for further evaluation and treatment. When possible, include family members and  significant others in education/counseling sessions.    Expected Outcomes Short Term: Participant demonstrates changes in health-related behavior, relaxation and other stress management skills, ability to obtain effective social support, and compliance with psychotropic medications if prescribed.;Long Term: Emotional wellbeing is indicated by absence of clinically significant psychosocial distress or social isolation.             Core Components/Risk Factors/Patient Goals Review:    Core  Components/Risk Factors/Patient Goals at Discharge (Final Review):    ITP Comments:  ITP Comments     Row Name 11/01/22 1048           ITP Comments Dr. Armanda Magic medical director. Introduction to [facility] education/ intensive cardiac rehab. Initial orientation packet reviewed with patient.                Comments: Participant attended orientation for the cardiac rehabilitation program on  11/01/2022  to perform initial intake and exercise walk test. Patient introduced to the [facility] education and orientation packet was reviewed. Completed 6-minute walk test, measurements, initial ITP, and exercise prescription. Vital signs stable. Telemetry-normal sinus rhythm, asymptomatic.   Service time was from 1012 to 1200.

## 2022-11-01 NOTE — Progress Notes (Signed)
Cardiac Rehab Medication Review   Does the patient  feel that his/her medications are working for him/her?  YES   Has the patient been experiencing any side effects to the medications prescribed?  NO  Does the patient measure his/her own blood pressure or blood glucose at home?  YES   Does the patient have any problems obtaining medications due to transportation or finances?   NO  Understanding of regimen: good Understanding of indications: good Potential of compliance: good    Comments: Vannie has a good understanding of her medications and regime. She has a BP cuff at home that she uses to check her BP's once a week. She does not check her weights daily, was encouraged to do so.     Colbert Ewing, MS 11/01/2022 10:51 AM

## 2022-11-02 ENCOUNTER — Ambulatory Visit: Payer: Self-pay | Admitting: Critical Care Medicine

## 2022-11-06 ENCOUNTER — Encounter (HOSPITAL_COMMUNITY): Payer: Medicaid Other

## 2022-11-08 ENCOUNTER — Other Ambulatory Visit (HOSPITAL_COMMUNITY): Payer: Self-pay

## 2022-11-08 ENCOUNTER — Ambulatory Visit: Payer: Medicaid Other | Attending: Critical Care Medicine | Admitting: Physician Assistant

## 2022-11-08 ENCOUNTER — Encounter: Payer: Self-pay | Admitting: Physician Assistant

## 2022-11-08 ENCOUNTER — Encounter (HOSPITAL_COMMUNITY): Payer: Medicaid Other

## 2022-11-08 VITALS — BP 139/86 | HR 90 | Temp 98.4°F | Wt 172.0 lb

## 2022-11-08 DIAGNOSIS — H1132 Conjunctival hemorrhage, left eye: Secondary | ICD-10-CM | POA: Diagnosis not present

## 2022-11-08 DIAGNOSIS — I5032 Chronic diastolic (congestive) heart failure: Secondary | ICD-10-CM | POA: Diagnosis not present

## 2022-11-08 DIAGNOSIS — Z09 Encounter for follow-up examination after completed treatment for conditions other than malignant neoplasm: Secondary | ICD-10-CM

## 2022-11-08 MED ORDER — DAPAGLIFLOZIN PROPANEDIOL 10 MG PO TABS
10.0000 mg | ORAL_TABLET | Freq: Every day | ORAL | 3 refills | Status: DC
  Filled 2022-11-08: qty 90, 90d supply, fill #0

## 2022-11-08 MED ORDER — ENTRESTO 49-51 MG PO TABS
1.0000 | ORAL_TABLET | Freq: Two times a day (BID) | ORAL | 3 refills | Status: DC
  Filled 2022-11-08: qty 180, 90d supply, fill #0

## 2022-11-08 NOTE — Progress Notes (Signed)
Patient ID: Lisa Ayala, female   DOB: 06/06/1966, 57 y.o.   MRN: [id number]   Lisa Ayala, is a 57 y.o. female  [id number]  [id number]  DOB - [birth year]  Chief Complaint  Patient presents with   Medication Refill   Congestive Heart Failure       Subjective:   Lisa Ayala is a 57 y.o. female here today for med RF and for f/up after being seen at ED 10/27/2022 for L subconjunctival hemorrhage.  She is requesting 90 day supply on entresto and farxiga.  Eye is much better/resolved.  Starting cardiac rehab and exercise.  No leg swelling.  No CP/SOB/dizziness.  Labs 09/2022.   No problems updated.  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 08/2021 Echo: EF 70-75%, no rwma, mod dil LV, GrII DD, nl RV fxn, massively dil LA, mild-mod dil RA, severe MR due to lack of central coaptation of MV leaflets. Mild MS. Mod TR. Mild-mod AI.   Tricuspid regurgitation     MEDICATIONS AT HOME: Prior to Admission medications   Medication Sig Start Date End Date Taking? Authorizing Provider  aspirin 81 MG chewable tablet Chew 1 tablet (81 mg total) by mouth daily. 06/13/22   Elsie Stain, MD  dapagliflozin propanediol (FARXIGA) 10 MG TABS tablet Take 1 tablet (10 mg total) by mouth daily. 11/08/22   Argentina Donovan, PA-C  magnesium oxide (MAG-OX) 400 MG tablet Take 1 tablet (400 mg total) by mouth 2 (two) times daily. Patient not taking: Reported on 10/25/2022 03/27/22   Bensimhon, Shaune Pascal, MD  Multiple Vitamin (MULTIVITAMIN ADULT PO) Take 1 tablet by mouth daily.    [provider]  polyethylene glycol powder (GLYCOLAX/MIRALAX) 17 GM/SCOOP powder Take 17 g by mouth daily dissolved in  water. 07/10/22   Gertie Gowda, DO  sacubitril-valsartan (ENTRESTO) 49-51 MG Take 1 tablet by mouth 2 (two) times daily. 11/08/22   Argentina Donovan, PA-C  warfarin (COUMADIN) 4 MG tablet Take 1 tablet (4 mg total) by mouth daily. 08/29/22   Elsie Stain, MD    ROS: Neg HEENT Neg resp Neg cardiac Neg GI Neg GU Neg MS Neg psych Neg neuro  Objective:   Vitals:   11/08/22 1401  BP: 139/86  Pulse: 90  Temp: 98.4 F (36.9 C)  SpO2: 100%  Weight: 172 lb (78 kg)   Exam General appearance : Awake, alert, not in any distress. Speech Clear. Not toxic looking HEENT: Atraumatic and Normocephalic Neck: Supple, no JVD. No cervical lymphadenopathy.  Chest: Good air entry bilaterally, CTAB.  No rales/rhonchi/wheezing CVS: S1 S2 regular Extremities: B/L Lower Ext shows no edema, both legs are warm to touch Neurology: Awake alert, and oriented X 3, CN II-XII intact, Non focal Skin: No Rash  Data Review Lab Results  Component Value Date   HGBA1C 5.3 02/07/2022    Assessment & Plan   1. Chronic diastolic heart failure (HCC) stable - sacubitril-valsartan (ENTRESTO) 49-51 MG; Take 1 tablet by mouth 2 (two) times daily.  Dispense: 180 tablet; Refill: 3 - dapagliflozin propanediol (FARXIGA) 10 MG TABS tablet; Take 1 tablet (10 mg total) by mouth daily.  Dispense: 90 tablet; Refill: 3  2. Subconjunctival hemorrhage of left eye resolved  3. Encounter for examination following treatment at hospital Much improved/resolved    Return in about 1 month (around 12/07/2022) for PCP for chronic conditions(Dr Joya Gaskins).  The patient was given clear instructions to go to ER or return to medical center if symptoms don't improve, worsen or new problems develop. The patient verbalized understanding. The patient was told to call to get lab results if they haven't heard anything in the next week.      Freeman Caldron, PA-C [facility] and [place],  [place]   11/08/2022, 2:27 PM

## 2022-11-10 ENCOUNTER — Telehealth (HOSPITAL_COMMUNITY): Payer: Self-pay | Admitting: *Deleted

## 2022-11-10 ENCOUNTER — Encounter (HOSPITAL_COMMUNITY): Payer: Medicaid Other

## 2022-11-10 NOTE — Telephone Encounter (Signed)
Left message to call cardiac rehab.Barnet Pall, RN,BSN 11/10/2022 2:43 PM

## 2022-11-10 NOTE — Telephone Encounter (Signed)
Spoke with Lisa Ayala she plans to start exercise on Monday and was notified that she can take rest breaks whenever needed. Patient is agreeable to the plan.[name] RN BSN

## 2022-11-13 ENCOUNTER — Encounter (HOSPITAL_COMMUNITY)
Admission: RE | Admit: 2022-11-13 | Discharge: 2022-11-13 | Disposition: A | Discharge status: Home / Self Care | Payer: Medicaid Other | Source: Ambulatory Visit | Attending: Internal Medicine | Admitting: Internal Medicine

## 2022-11-13 ENCOUNTER — Telehealth (HOSPITAL_COMMUNITY): Payer: Self-pay

## 2022-11-13 ENCOUNTER — Other Ambulatory Visit (HOSPITAL_COMMUNITY): Payer: Self-pay

## 2022-11-13 DIAGNOSIS — Z952 Presence of prosthetic heart valve: Secondary | ICD-10-CM

## 2022-11-13 DIAGNOSIS — Z9889 Other specified postprocedural states: Secondary | ICD-10-CM

## 2022-11-13 NOTE — Telephone Encounter (Signed)
Lisa Ayala from Cardiac Rehab called with patient in rehab today. Patient states she cannot pay for her farxiga and is out of her entresto. She wants to know if we can do samples or some kind of assistance for her. She cannot afford even the $4 copay she states. Can you check on this one?

## 2022-11-13 NOTE — Progress Notes (Signed)
Daily Session Note  Patient Details  Name: Lisa Ayala MRN: [id number] Date of Birth: 1965/12/07 Referring Provider:   Flowsheet Row CARDIAC REHAB PHASE II ORIENTATION from 11/01/2022 in [facility] [place]  Referring Provider Glori Bickers, MD       Encounter Date: 11/13/2022  Check In:  Session Check In - 11/13/22 1326       Check-In   Supervising physician immediately available to respond to emergencies [facility] MD immediately available    Physician(s) Christen Bame, NP    Location [facility]    Staff Present Sandy Salaam, MS, Exercise Physiologist;David Lilyan Punt, MS, ACSM-CEP, CCRP, Exercise Physiologist;Olinty Celesta Aver, MS, ACSM-CEP, Exercise Physiologist;Jetta Gilford Rile BS, ACSM-CEP, Exercise Physiologist;Milady Fleener, RN, BSN    Virtual Visit No    Medication changes reported     No    Fall or balance concerns reported    No    Tobacco Cessation No Change    Warm-up and Cool-down Performed as group-led instruction    Resistance Training Performed Yes    VAD Patient? No    PAD/SET Patient? No      Pain Assessment   Currently in Pain? No/denies    Pain Score 0-No pain    Multiple Pain Sites No             Capillary Blood Glucose: No results found for this or any previous visit (from the past 24 hour(s)).   Exercise Prescription Changes - 11/13/22 1500       Response to Exercise   Blood Pressure (Admit) 152/90    Blood Pressure (Exercise) 158/90    Blood Pressure (Exit) 140/90    Heart Rate (Admit) 85 bpm    Heart Rate (Exercise) 109 bpm    Rating of Perceived Exertion (Exercise) 11    Symptoms None    Comments Pt's first day in the [facility] program    Duration Continue with 30 min of aerobic exercise without signs/symptoms of physical distress.    Intensity THRR unchanged      Progression   Progression Continue to progress workloads to maintain intensity without signs/symptoms of  physical distress.    Average METs 2.3      Resistance Training   Training Prescription Yes    Weight 2 lbs    Reps 10-15    Time 10 Minutes      Interval Training   Interval Training No      NuStep   Level 2    SPM 65    Minutes 15    METs 1.9      Track   Laps 14    Minutes 15    METs 2.79             Social History   Tobacco Use  Smoking Status Never  Smokeless Tobacco Never    Goals Met:  Exercise tolerated well No report of concerns or symptoms today Strength training completed today  Goals Unmet:  Not Applicable  Comments: Pt started cardiac rehab today.  Pt tolerated light exercise without difficulty. Diastolic  BP's were in the  90's today,  Patient said that she cannot afford her copay to get her entresto and farxiga left message at the heart failure clinic. telemetry-Sinus Rhythm, asymptomatic.  Medication list reconciled. Pt denies barriers to medicaiton compliance.  PSYCHOSOCIAL ASSESSMENT:  PHQ-3. Pt exhibits positive coping skills, hopeful outlook with supportive family. [name] admits to feeling depressed about her financial  situation and wishes that she could work to make more income. [name] says she has applied for disability the process takes a long time.    Pt enjoys reading word searches and spending time with her family.   Pt oriented to exercise equipment and routine.    Understanding verbalized. Will continue to monitor BP.[name] RN BSN    Dr. Fransico Him is Medical Director for Cardiac Rehab at [facility].

## 2022-11-14 ENCOUNTER — Other Ambulatory Visit (HOSPITAL_COMMUNITY): Payer: Self-pay

## 2022-11-14 NOTE — Progress Notes (Signed)
QUALITY OF LIFE SCORE REVIEW  Pt completed Quality of Life survey as a participant in Cardiac Rehab.  Scores 21.0 or below are considered low.  Pt score very low in all areas Overall 8.69, Health and Function 3.69, socioeconomic 18.00, physiological and spiritual 6.86, family 16.00. Patient quality of life slightly altered by physical constraints which limits ability to perform as prior to recent cardiac illness.Lisa Ayala admits to being dissatisfied with her health due to recent valve replacement surgery, [place] has many financial stressors able to get food with food stamps but is having difficulty paying bills and paying for her medications. Admits to being depressed but says she is not interested in receiving counseling at this time. Offered emotional support and reassurance.  Will continue to monitor and intervene as necessary. Will forward to Dr Joya Gaskins her primary care provider.[name] RN BSN

## 2022-11-15 ENCOUNTER — Other Ambulatory Visit (HOSPITAL_COMMUNITY): Payer: Self-pay

## 2022-11-15 ENCOUNTER — Encounter (HOSPITAL_COMMUNITY)
Admission: RE | Admit: 2022-11-15 | Discharge: 2022-11-15 | Disposition: A | Discharge status: Home / Self Care | Payer: Medicaid Other | Source: Ambulatory Visit | Attending: Internal Medicine | Admitting: Internal Medicine

## 2022-11-15 DIAGNOSIS — Z9889 Other specified postprocedural states: Secondary | ICD-10-CM

## 2022-11-15 DIAGNOSIS — Z952 Presence of prosthetic heart valve: Secondary | ICD-10-CM

## 2022-11-15 NOTE — Telephone Encounter (Signed)
I have spoken with the pharmacy and with patients daughter; she should be able to receive this medication with a waived copay. Nothing further needed at this time.

## 2022-11-17 ENCOUNTER — Encounter (HOSPITAL_COMMUNITY)
Admission: RE | Admit: 2022-11-17 | Discharge: 2022-11-17 | Disposition: A | Discharge status: Home / Self Care | Payer: Medicaid Other | Source: Ambulatory Visit | Attending: Internal Medicine | Admitting: Internal Medicine

## 2022-11-17 DIAGNOSIS — Z9889 Other specified postprocedural states: Secondary | ICD-10-CM

## 2022-11-17 DIAGNOSIS — Z952 Presence of prosthetic heart valve: Secondary | ICD-10-CM

## 2022-11-20 ENCOUNTER — Encounter (HOSPITAL_COMMUNITY)
Admission: RE | Admit: 2022-11-20 | Discharge: 2022-11-20 | Disposition: A | Discharge status: Home / Self Care | Payer: Medicaid Other | Source: Ambulatory Visit | Attending: Internal Medicine | Admitting: Internal Medicine

## 2022-11-20 DIAGNOSIS — Z952 Presence of prosthetic heart valve: Secondary | ICD-10-CM | POA: Diagnosis not present

## 2022-11-20 DIAGNOSIS — Z9889 Other specified postprocedural states: Secondary | ICD-10-CM

## 2022-11-21 NOTE — Progress Notes (Signed)
Cardiac Individual Treatment Plan  Patient Details  Name: Lisa Ayala MRN: [id number] Date of Birth: March 18, 1966 Referring Provider:   Flowsheet Row CARDIAC REHAB PHASE II ORIENTATION from 11/01/2022 in [facility], & [place]  Referring Provider Glori Bickers, MD       Initial Encounter Date:  [place] PHASE II ORIENTATION from 11/01/2022 in [facility]  Date 11/01/22       Visit Diagnosis: S/P mitral valve replacement  S/P tricuspid valve repair  Patient's Home Medications on Admission:  Current Outpatient Medications:    aspirin 81 MG chewable tablet, Chew 1 tablet (81 mg total) by mouth daily. (Patient not taking: Reported on 11/13/2022), Disp: 100 tablet, Rfl: 2   dapagliflozin propanediol (FARXIGA) 10 MG TABS tablet, Take 1 tablet (10 mg total) by mouth daily., Disp: 90 tablet, Rfl: 3   magnesium oxide (MAG-OX) 400 MG tablet, Take 1 tablet (400 mg total) by mouth 2 (two) times daily. 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 08/2021 Echo: EF 70-75%, no rwma, mod dil LV, GrII DD, nl RV fxn, massively dil LA, mild-mod dil RA, severe MR due to lack of central coaptation of MV leaflets. Mild MS. Mod TR. Mild-mod AI.    Tricuspid regurgitation     Tobacco Use: Social History   Tobacco Use  Smoking Status Never  Smokeless Tobacco Never    Labs: Review Flowsheet  More data exists      Latest Ref Rng & Units 02/16/2022 02/17/2022 02/18/2022 02/19/2022 02/20/2022  Labs for ITP Cardiac and Pulmonary Rehab  Trlycerides <150 mg/dL - 109  - - 64   O2 Saturation % 71.1  66  87.2  70.2  -    Capillary Blood Glucose: Lab Results  Component Value Date   GLUCAP 99 02/22/2022   GLUCAP 150 (H) 02/20/2022   GLUCAP 99 02/20/2022   GLUCAP 90 02/20/2022   GLUCAP 140 (H) 02/20/2022     Exercise Target Goals: Exercise Program Goal: Individual exercise prescription set using results from initial 6 min walk test and THRR while considering  patient's activity barriers and safety.   Exercise Prescription Goal: Initial exercise prescription builds to 30-45 minutes a day of aerobic activity, 2-3 days per week.  Home exercise guidelines will be given to patient during program as part of exercise prescription that the participant will acknowledge.  Activity Barriers & Risk Stratification:  Activity Barriers & Cardiac Risk Stratification - 11/01/22 1304       Activity Barriers & Cardiac Risk Stratification   Activity Barriers Back Problems;Decreased Ventricular Function;Deconditioning;Assistive Device   pt has scoliosis, uses walker with long distances   Cardiac Risk Stratification High   <5 METS on 6MWT  6 Minute Walk:  6 Minute Walk     Row Name 11/01/22 1303         6 Minute Walk   Phase Initial     Distance 1504 feet     Walk Time 6 minutes     # of Rest Breaks 0     MPH 2.84     METS 4.1     RPE 11     Perceived Dyspnea  0     VO2 Peak 14.34     Symptoms No     Resting HR 68 bpm     Resting BP 142/88     Resting Oxygen Saturation  98 %     Exercise Oxygen Saturation  during 6 min walk 100 %     Max Ex. HR 102 bpm     Max Ex. BP 152/86     2 Minute Post BP 140/84               Oxygen Initial Assessment:   Oxygen Re-Evaluation:   Oxygen Discharge (Final Oxygen Re-Evaluation):   Initial Exercise Prescription:  Initial Exercise Prescription - 11/01/22 1300       Date of Initial Exercise RX and Referring Provider   Date 11/01/22    Referring Provider Glori Bickers, MD    Expected Discharge Date 01/12/23      NuStep   Level 2    SPM 70    Minutes 15    METs 3      Track   Laps 17    Minutes 15    METs 3      Prescription Details   Frequency (times per week) 3    Duration Progress to 30 minutes of continuous aerobic without signs/symptoms of physical distress      Intensity   THRR 40-80% of Max Heartrate 65-131    Ratings of Perceived Exertion 11-13    Perceived Dyspnea 0-4      Progression   Progression Continue to progress workloads to maintain intensity without signs/symptoms of physical distress.      Resistance Training   Training Prescription Yes    Weight 2    Reps 10-15             Perform Capillary Blood Glucose checks as needed.  Exercise Prescription Changes:   Exercise Prescription Changes     Row Name 11/13/22 1500             Response to Exercise   Blood Pressure (Admit) 152/90       Blood Pressure (Exercise) 158/90       Blood Pressure (Exit) 140/90       Heart Rate (Admit) 85 bpm       Heart Rate (Exercise) 109 bpm       Rating of Perceived Exertion (Exercise) 11       Symptoms None       Comments Pt's first day in the [facility] program       Duration Continue with 30 min of aerobic exercise without signs/symptoms of physical distress.       Intensity THRR unchanged         Progression   Progression Continue to progress workloads to maintain intensity without signs/symptoms of physical distress.       Average METs 2.3         Resistance Training   Training Prescription Yes       Weight 2 lbs  Reps 10-15       Time 10 Minutes         Interval Training   Interval Training No          NuStep   Level 2       SPM 65       Minutes 15       METs 1.9         Track   Laps 14       Minutes 15       METs 2.79                Exercise Comments:   Exercise Comments     Row Name 11/13/22 1538           Exercise Comments Pt's first day in the [place] program. Pt had no complaints other than feeling tired.                Exercise Goals and Review:   Exercise Goals     Row Name 11/01/22 1307             Exercise Goals   Increase Physical Activity Yes       Intervention Provide advice, education, support and counseling about physical activity/exercise needs.;Develop an individualized exercise prescription for aerobic and resistive training based on initial evaluation findings, risk stratification, comorbidities and participant's personal goals.       Expected Outcomes Short Term: Attend rehab on a regular basis to increase amount of physical activity.;Long Term: Exercising regularly at least 3-5 days a week.;Long Term: Add in home exercise to make exercise part of routine and to increase amount of physical activity.       Increase Strength and Stamina Yes       Intervention Provide advice, education, support and counseling about physical activity/exercise needs.;Develop an individualized exercise prescription for aerobic and resistive training based on initial evaluation findings, risk stratification, comorbidities and participant's personal goals.       Expected Outcomes Short Term: Increase workloads from initial exercise prescription for resistance, speed, and METs.;Long Term: Improve cardiorespiratory fitness, muscular endurance and strength as measured by increased METs and functional capacity (6MWT);Short Term: Perform resistance training exercises routinely during rehab and add in resistance training at home       Able to understand and use rate of perceived exertion (RPE) scale Yes       Intervention Provide education and explanation on how to use RPE  scale       Expected Outcomes Short Term: Able to use RPE daily in rehab to express subjective intensity level;Long Term:  Able to use RPE to guide intensity level when exercising independently       Knowledge and understanding of Target Heart Rate Range (THRR) Yes       Intervention Provide education and explanation of THRR including how the numbers were predicted and where they are located for reference       Expected Outcomes Short Term: Able to state/look up THRR;Short Term: Able to use daily as guideline for intensity in rehab;Long Term: Able to use THRR to govern intensity when exercising independently       Understanding of Exercise Prescription Yes       Intervention Provide education, explanation, and written materials on patient's individual exercise prescription       Expected Outcomes Short Term: Able to explain program exercise prescription;Long Term: Able to explain home exercise prescription to exercise independently  Exercise Goals Re-Evaluation :  Exercise Goals Re-Evaluation     [place] Name 11/13/22 1536             Exercise Goal Re-Evaluation   Exercise Goals Review Increase Physical Activity;Increase Strength and Stamina;Able to understand and use rate of perceived exertion (RPE) scale;Knowledge and understanding of Target Heart Rate Range (THRR);Understanding of Exercise Prescription       Comments Pt's first day in the [facility] program. Pt undersntads the exercise Rx, RPE sclae and THRR.       Expected Outcomes Will continue to monitor patient and progress exercise workloads as tolerated.                Discharge Exercise Prescription (Final Exercise Prescription Changes):  Exercise Prescription Changes - 11/13/22 1500       Response to Exercise   Blood Pressure (Admit) 152/90    Blood Pressure (Exercise) 158/90    Blood Pressure (Exit) 140/90    Heart Rate (Admit) 85 bpm    Heart Rate (Exercise) 109 bpm    Rating of Perceived Exertion  (Exercise) 11    Symptoms None    Comments Pt's first day in the [facility] program    Duration Continue with 30 min of aerobic exercise without signs/symptoms of physical distress.    Intensity THRR unchanged      Progression   Progression Continue to progress workloads to maintain intensity without signs/symptoms of physical distress.    Average METs 2.3      Resistance Training   Training Prescription Yes    Weight 2 lbs    Reps 10-15    Time 10 Minutes      Interval Training   Interval Training No      NuStep   Level 2    SPM 65    Minutes 15    METs 1.9      Track   Laps 14    Minutes 15    METs 2.79             Nutrition:  Target Goals: Understanding of nutrition guidelines, daily intake of sodium '1500mg'$ , cholesterol '200mg'$ , calories 30% from fat and 7% or less from saturated fats, daily to have 5 or more servings of fruits and vegetables.  Biometrics:  Pre Biometrics - 11/01/22 1055       Pre Biometrics   Waist Circumference 38 inches    Hip Circumference 41 inches    Waist to Hip Ratio 0.93 %    Triceps Skinfold 38 mm    % Body Fat 40.8 %    Grip Strength 30 kg    Flexibility 20 in    Single Leg Stand 22.06 seconds              Nutrition Therapy Plan and Nutrition Goals:  Nutrition Therapy & Goals - 11/13/22 1445       Nutrition Therapy   Diet Heart Healthy Diet    Drug/Food Interactions Coumadin/Vit K      Personal Nutrition Goals   Nutrition Goal Patient to identify strategies for reducing cardiovascular risk by attending the weekly Pritikin education and nutrition series    Personal Goal #2 Patient to improve diet quality by using the plate method as a daily guide for meal planning to include lean protein/plant protein, fruits, vegetables, whole grains, nonfat dairy as part of well balanced diet    Personal Goal #3 Patient to identify strategies for weight loss of 0.5-2.0# per week.  Personal Goal #4 Patient to reduce sodium to '1500mg'$   per day.    Comments Lechelle is motivated to lose weight to ~150#. She will benefit from participation in intensive cardiac rehab for nutrition, exercise, and lifestyle modification.      Intervention Plan   Intervention Prescribe, educate and counsel regarding individualized specific dietary modifications aiming towards targeted core components such as weight, hypertension, lipid management, diabetes, heart failure and other comorbidities.;Nutrition handout(s) given to patient.    Expected Outcomes Short Term Goal: Understand basic principles of dietary content, such as calories, fat, sodium, cholesterol and nutrients.;Long Term Goal: Adherence to prescribed nutrition plan.             Nutrition Assessments:  MEDIFICTS Score Key: ?70 Need to make dietary changes  40-70 Heart Healthy Diet ? 40 Therapeutic Level Cholesterol Diet    Picture Your Plate Scores: [id number] Unhealthy dietary pattern with much room for improvement. 41-50 Dietary pattern unlikely to meet recommendations for good health and room for improvement. 51-60 More healthful dietary pattern, with some room for improvement.  >60 Healthy dietary pattern, although there may be some specific behaviors that could be improved.    Nutrition Goals Re-Evaluation:  Nutrition Goals Re-Evaluation     [place] Name 11/13/22 1445             Goals   Current Weight 169 lb 8.5 oz (76.9 kg)       Comment Cr 1.34, GFR 46, A1c WNL, Triglycerides WNL       Expected Outcome Jovonda is motivated to lose weight to ~150#. She will benefit from participation in intensive cardiac rehab for nutrition, exercise, and lifestyle modification.                Nutrition Goals Re-Evaluation:  Nutrition Goals Re-Evaluation     [place] Name 11/13/22 1445             Goals   Current Weight 169 lb 8.5 oz (76.9 kg)       Comment Cr 1.34, GFR 46, A1c WNL, Triglycerides WNL       Expected Outcome Jaydence is motivated to lose weight to ~150#. She will  benefit from participation in intensive cardiac rehab for nutrition, exercise, and lifestyle modification.                Nutrition Goals Discharge (Final Nutrition Goals Re-Evaluation):  Nutrition Goals Re-Evaluation - 11/13/22 1445       Goals   Current Weight 169 lb 8.5 oz (76.9 kg)    Comment Cr 1.34, GFR 46, A1c WNL, Triglycerides WNL    Expected Outcome Siiri is motivated to lose weight to ~150#. She will benefit from participation in intensive cardiac rehab for nutrition, exercise, and lifestyle modification.             Psychosocial: Target Goals: Acknowledge presence or absence of significant depression and/or stress, maximize coping skills, provide positive support system. Participant is able to verbalize types and ability to use techniques and skills needed for reducing stress and depression.  Initial Review & Psychosocial Screening:  Initial Psych Review & Screening - 11/01/22 1308       Initial Review   Current issues with Current Stress Concerns    Source of Stress Concerns Chronic Illness;Financial;Occupation;Unable to participate in former interests or hobbies    Comments Aveleen admits to having some stress in her life currently due to her recent heart diagnosis and financial reasons. Gwynn shared that she has not worked since  2022 and has been living on disability. This has caused stress with paying for bills and living expenses. Wendy also shared that her recent diagnosis has kept her from doing things she enjoyed prior to surgery. Reassurance and support was offered to [place], she denied any further need for resources.      Family Dynamics   Good Support System? Yes   Raeana has her daughter for support     Barriers   Psychosocial barriers to participate in program The patient should benefit from training in stress management and relaxation.      Screening Interventions   Interventions Encouraged to exercise;To provide support and resources with identified  psychosocial needs;Provide feedback about the scores to participant    Expected Outcomes Long Term Goal: Stressors or current issues are controlled or eliminated.;Short Term goal: Identification and review with participant of any Quality of Life or Depression concerns found by scoring the questionnaire.;Long Term goal: The participant improves quality of Life and PHQ9 Scores as seen by post scores and/or verbalization of changes             Quality of Life Scores:  Quality of Life - 11/01/22 1313       Quality of Life   Select Quality of Life      Quality of Life Scores   Health/Function Pre 3.69 %    Socioeconomic Pre 18 %    Psych/Spiritual Pre 6.86 %    Family Pre 16 %    GLOBAL Pre 8.69 %            Scores of 19 and below usually indicate a poorer quality of life in these areas.  A difference of  2-3 points is a clinically meaningful difference.  A difference of 2-3 points in the total score of the Quality of Life Index has been associated with significant improvement in overall quality of life, self-image, physical symptoms, and general health in studies assessing change in quality of life.  PHQ-9: Review Flowsheet  More data may exist      11/08/2022 11/01/2022 08/09/2022 06/07/2022 04/11/2022  Depression screen PHQ 2/9  Decreased Interest 0 0 0 0 0  Down, Depressed, Hopeless 0 1 0 0 0  PHQ - 2 Score 0 1 0 0 0  Altered sleeping 0 0 - 0 -  Tired, decreased energy 1 1 - 0 -  Change in appetite 1 0 - 0 -  Feeling bad or failure about yourself  0 1 - 0 -  Trouble concentrating 0 0 - 0 -  Moving slowly or fidgety/restless 0 0 - 0 -  Suicidal thoughts 0 0 - 0 -  PHQ-9 Score 2 3 - 0 -  Difficult doing work/chores - Somewhat difficult - Not difficult at all -   Interpretation of Total Score  Total Score Depression Severity:  1-4 = Minimal depression, 5-9 = Mild depression, 10-14 = Moderate depression, 15-19 = Moderately severe depression, 20-27 = Severe depression    Psychosocial Evaluation and Intervention:   Psychosocial Re-Evaluation:  Psychosocial Re-Evaluation     Row Name 11/14/22 [phone] 11/21/22 [id number]           Psychosocial Re-Evaluation   Current issues with Current Stress Concerns;Current Depression Current Stress Concerns;Current Depression      Comments Reviewed quality of life questionnaire with the patient. Forwarded to primary care provider. Admits to being depressed due to health and financial restraints. Admits to being depressed due to health and financial restraints. Adene was able  to get assistance with getting medications. Winna says she feels better knowing that she Ayala get her medications and not worry about cost.      Expected Outcomes Anjanae will have controlled stress, decreased depression upon completion of intensive cardiac rehab. Luther will have controlled stress, decreased depression upon completion of intensive cardiac rehab.      Interventions Stress management education;Relaxation education;Encouraged to attend Cardiac Rehabilitation for the exercise Stress management education;Relaxation education;Encouraged to attend Cardiac Rehabilitation for the exercise      Continue Psychosocial Services  Follow up required by staff Follow up required by staff        Initial Review   Source of Stress Concerns Chronic Illness;Financial;Unable to participate in former interests or hobbies;Unable to perform yard/household activities Chronic Illness;Financial;Unable to participate in former interests or hobbies;Unable to perform yard/household activities      Comments will continue to monitor and offer support as needed will continue to monitor and offer support as needed               Psychosocial Discharge (Final Psychosocial Re-Evaluation):  Psychosocial Re-Evaluation - 11/21/22 [id number]       Psychosocial Re-Evaluation   Current issues with Current Stress Concerns;Current Depression    Comments Admits to being depressed due to  health and financial restraints. Jorgia was able to get assistance with getting medications. Kemely says she feels better knowing that she Ayala get her medications and not worry about cost.    Expected Outcomes Mercadez will have controlled stress, decreased depression upon completion of intensive cardiac rehab.    Interventions Stress management education;Relaxation education;Encouraged to attend Cardiac Rehabilitation for the exercise    Continue Psychosocial Services  Follow up required by staff      Initial Review   Source of Stress Concerns Chronic Illness;Financial;Unable to participate in former interests or hobbies;Unable to perform yard/household activities    Comments will continue to monitor and offer support as needed             Vocational Rehabilitation: Provide vocational rehab assistance to qualifying candidates.   Vocational Rehab Evaluation & Intervention:  Vocational Rehab - 11/01/22 1314       Initial Vocational Rehab Evaluation & Intervention   Assessment shows need for Vocational Rehabilitation No   Tanajia has not worked since 2022, however she denies any need for vocational rehab at this time            Education: Education Goals: Education classes will be provided on a weekly basis, covering required topics. Participant will state understanding/return demonstration of topics presented.    Education     Row Name 11/13/22 1500     Education   Cardiac Education Topics Pritikin   Academic librarian Exercise Education   Exercise Education Biomechanial Limitations   Instruction Review Code 1- Verbalizes Understanding   Class Start Time 1406   Class Stop Time 1442   Class Time Calculation (min) 36 min    [place] Name 11/15/22 1600     Education   Cardiac Education Topics [place]   Educator Dietitian   Weekly Topic Fast Evening Meals   Instruction  Review Code 1- Verbalizes Understanding   Class Start Time 1400   Class Stop Time 1445   Class Time Calculation (min) 45 min    [place] Name 11/20/22 1500  Education   Cardiac Education Topics Pritikin   Financial trader   Weekly Topic International Cuisine- Spotlight on the [facility] Zones   Instruction Review Code 1- Verbalizes Understanding   Class Start Time 1405   Class Stop Time 1453   Class Time Calculation (min) 48 min            Core Videos: Exercise    Move It!  Clinical staff conducted group or individual video education with verbal and written material and guidebook.  Patient learns the recommended Pritikin exercise program. Exercise with the goal of living a long, healthy life. Some of the health benefits of exercise include controlled diabetes, healthier blood pressure levels, improved cholesterol levels, improved heart and lung capacity, improved sleep, and better body composition. Everyone should speak with their doctor before starting or changing an exercise routine.  Biomechanical Limitations Clinical staff conducted group or individual video education with verbal and written material and guidebook.  Patient learns how biomechanical limitations Ayala impact exercise and how we Ayala mitigate and possibly overcome limitations to have an impactful and balanced exercise routine.  Body Composition Clinical staff conducted group or individual video education with verbal and written material and guidebook.  Patient learns that body composition (ratio of muscle mass to fat mass) is a key component to assessing overall fitness, rather than body weight alone. Increased fat mass, especially visceral belly fat, Ayala put [place] at increased risk for metabolic syndrome, type 2 diabetes, heart disease, and even death. It is recommended to combine diet and exercise (cardiovascular and resistance training) to improve your body composition. Seek  guidance from your physician and exercise physiologist before implementing an exercise routine.  Exercise Action Plan Clinical staff conducted group or individual video education with verbal and written material and guidebook.  Patient learns the recommended strategies to achieve and enjoy long-term exercise adherence, including variety, self-motivation, self-efficacy, and positive decision making. Benefits of exercise include fitness, good health, weight management, more energy, better sleep, less stress, and overall well-being.  Medical   Heart Disease Risk Reduction Clinical staff conducted group or individual video education with verbal and written material and guidebook.  Patient learns our heart is our most vital organ as it circulates oxygen, nutrients, white blood cells, and hormones throughout the entire body, and carries waste away. Data supports a plant-based eating plan like the Pritikin Program for its effectiveness in slowing progression of and reversing heart disease. The video provides a number of recommendations to address heart disease.   Metabolic Syndrome and Belly Fat  Clinical staff conducted group or individual video education with verbal and written material and guidebook.  Patient learns what metabolic syndrome is, how it leads to heart disease, and how one Ayala reverse it and keep it from coming back. You have metabolic syndrome if you have 3 of the following 5 criteria: abdominal obesity, high blood pressure, high triglycerides, low HDL cholesterol, and high blood sugar.  Hypertension and Heart Disease Clinical staff conducted group or individual video education with verbal and written material and guidebook.  Patient learns that high blood pressure, or hypertension, is very common in the [place]. Hypertension is largely due to excessive salt intake, but other important risk factors include being overweight, physical inactivity, drinking too much alcohol, smoking, and  not eating enough potassium from fruits and vegetables. High blood pressure is a leading risk factor for heart attack, stroke, congestive heart failure, dementia, kidney  failure, and premature death. Long-term effects of excessive salt intake include stiffening of the arteries and thickening of heart muscle and organ damage. Recommendations include ways to reduce hypertension and the risk of heart disease.  Diseases of Our Time - Focusing on Diabetes Clinical staff conducted group or individual video education with verbal and written material and guidebook.  Patient learns why the best way to stop diseases of our time is prevention, through food and other lifestyle changes. Medicine (such as prescription pills and surgeries) is often only a Band-Aid on the problem, not a long-term solution. Most common diseases of our time include obesity, type 2 diabetes, hypertension, heart disease, and cancer. The Pritikin Program is recommended and has been proven to help reduce, reverse, and/or prevent the damaging effects of metabolic syndrome.  Nutrition   Overview of the Pritikin Eating Plan  Clinical staff conducted group or individual video education with verbal and written material and guidebook.  Patient learns about the [place] for disease risk reduction. The [place] emphasizes a wide variety of unrefined, minimally-processed carbohydrates, like fruits, vegetables, whole grains, and legumes. Go, Caution, and Stop food choices are explained. Plant-based and lean animal proteins are emphasized. Rationale provided for low sodium intake for blood pressure control, low added sugars for blood sugar stabilization, and low added fats and oils for coronary artery disease risk reduction and weight management.  Calorie Density  Clinical staff conducted group or individual video education with verbal and written material and guidebook.  Patient learns about calorie density and how it impacts  the Pritikin Eating Plan. Knowing the characteristics of the food you choose will help you decide whether those foods will lead to weight gain or weight loss, and whether you want to consume more or less of them. Weight loss is usually a side effect of the Pritikin Eating Plan because of its focus on low calorie-dense foods.  Label Reading  Clinical staff conducted group or individual video education with verbal and written material and guidebook.  Patient learns about the Pritikin recommended label reading guidelines and corresponding recommendations regarding calorie density, added sugars, sodium content, and whole grains.  Dining Out - Part 1  Clinical staff conducted group or individual video education with verbal and written material and guidebook.  Patient learns that restaurant meals Ayala be sabotaging because they Ayala be so high in calories, fat, sodium, and/or sugar. Patient learns recommended strategies on how to positively address this and avoid unhealthy pitfalls.  Facts on Fats  Clinical staff conducted group or individual video education with verbal and written material and guidebook.  Patient learns that lifestyle modifications Ayala be just as effective, if not more so, as many medications for lowering your risk of heart disease. A Pritikin lifestyle Ayala help to reduce your risk of inflammation and atherosclerosis (cholesterol build-up, or plaque, in the artery walls). Lifestyle interventions such as dietary choices and physical activity address the cause of atherosclerosis. A review of the types of fats and their impact on blood cholesterol levels, along with dietary recommendations to reduce fat intake is also included.  Nutrition Action Plan  Clinical staff conducted group or individual video education with verbal and written material and guidebook.  Patient learns how to incorporate Pritikin recommendations into their lifestyle. Recommendations include planning and keeping personal  health goals in mind as an important part of their success.  Healthy Mind-Set    Healthy Minds, Bodies, Hearts  Clinical staff conducted group or individual video education  with verbal and written material and guidebook.  Patient learns how to identify when they are stressed. Video will discuss the impact of that stress, as well as the many benefits of stress management. Patient will also be introduced to stress management techniques. The way we think, act, and feel has an impact on our hearts.  How Our Thoughts Ayala Heal Our Hearts  Clinical staff conducted group or individual video education with verbal and written material and guidebook.  Patient learns that negative thoughts Ayala cause depression and anxiety. This Ayala result in negative lifestyle behavior and serious health problems. Cognitive behavioral therapy is an effective method to help control our thoughts in order to change and improve our emotional outlook.  Additional Videos:  Exercise    Improving Performance  Clinical staff conducted group or individual video education with verbal and written material and guidebook.  Patient learns to use a non-linear approach by alternating intensity levels and lengths of time spent exercising to help burn more calories and lose more body fat. Cardiovascular exercise helps improve heart health, metabolism, hormonal balance, blood sugar control, and recovery from fatigue. Resistance training improves strength, endurance, balance, coordination, reaction time, metabolism, and muscle mass. Flexibility exercise improves circulation, posture, and balance. Seek guidance from your physician and exercise physiologist before implementing an exercise routine and learn your capabilities and proper form for all exercise.  Introduction to Yoga  Clinical staff conducted group or individual video education with verbal and written material and guidebook.  Patient learns about yoga, a discipline of the coming  together of mind, breath, and body. The benefits of yoga include improved flexibility, improved range of motion, better posture and core strength, increased lung function, weight loss, and positive self-image. Yoga's heart health benefits include lowered blood pressure, healthier heart rate, decreased cholesterol and triglyceride levels, improved immune function, and reduced stress. Seek guidance from your physician and exercise physiologist before implementing an exercise routine and learn your capabilities and proper form for all exercise.  Medical   Aging: Enhancing Your Quality of Life  Clinical staff conducted group or individual video education with verbal and written material and guidebook.  Patient learns key strategies and recommendations to stay in good physical health and enhance quality of life, such as prevention strategies, having an advocate, securing a [place], and keeping a list of medications and system for tracking them. It also discusses how to avoid risk for bone loss.  Biology of Weight Control  Clinical staff conducted group or individual video education with verbal and written material and guidebook.  Patient learns that weight gain occurs because we consume more calories than we burn (eating more, moving less). Even if your body weight is normal, you may have higher ratios of fat compared to muscle mass. Too much body fat puts you at increased risk for cardiovascular disease, heart attack, stroke, type 2 diabetes, and obesity-related cancers. In addition to exercise, following the [place] Ayala help reduce your risk.  Decoding Lab Results  Clinical staff conducted group or individual video education with verbal and written material and guidebook.  Patient learns that lab test reflects one measurement whose values change over time and are influenced by many factors, including medication, stress, sleep, exercise, food, hydration,  pre-existing medical conditions, and more. It is recommended to use the knowledge from this video to become more involved with your lab results and evaluate your numbers to speak with your doctor.   Diseases of  Our Time - Overview  Clinical staff conducted group or individual video education with verbal and written material and guidebook.  Patient learns that according to the CDC, 50% to 70% of chronic diseases (such as obesity, type 2 diabetes, elevated lipids, hypertension, and heart disease) are avoidable through lifestyle improvements including healthier food choices, listening to satiety cues, and increased physical activity.  Sleep Disorders Clinical staff conducted group or individual video education with verbal and written material and guidebook.  Patient learns how good quality and duration of sleep are important to overall health and well-being. Patient also learns about sleep disorders and how they impact health along with recommendations to address them, including discussing with a physician.  Nutrition  Dining Out - Part 2 Clinical staff conducted group or individual video education with verbal and written material and guidebook.  Patient learns how to plan ahead and communicate in order to maximize their dining experience in a healthy and nutritious manner. Included are recommended food choices based on the type of restaurant the patient is visiting.   Fueling a Best boy conducted group or individual video education with verbal and written material and guidebook.  There is a strong connection between our food choices and our health. Diseases like obesity and type 2 diabetes are very prevalent and are in large-part due to lifestyle choices. The Pritikin Eating Plan provides plenty of food and hunger-curbing satisfaction. It is easy to follow, affordable, and helps reduce health risks.  Menu Workshop  Clinical staff conducted group or individual video education  with verbal and written material and guidebook.  Patient learns that restaurant meals Ayala sabotage health goals because they are often packed with calories, fat, sodium, and sugar. Recommendations include strategies to plan ahead and to communicate with the manager, chef, or server to help order a healthier meal.  Planning Your Eating Strategy  Clinical staff conducted group or individual video education with verbal and written material and guidebook.  Patient learns about the [place] and its benefit of reducing the risk of disease. The [place] does not focus on calories. Instead, it emphasizes high-quality, nutrient-rich foods. By knowing the characteristics of the foods, we choose, we Ayala determine their calorie density and make informed decisions.  Targeting Your Nutrition Priorities  Clinical staff conducted group or individual video education with verbal and written material and guidebook.  Patient learns that lifestyle habits have a tremendous impact on disease risk and progression. This video provides eating and physical activity recommendations based on your personal health goals, such as reducing LDL cholesterol, losing weight, preventing or controlling type 2 diabetes, and reducing high blood pressure.  Vitamins and Minerals  Clinical staff conducted group or individual video education with verbal and written material and guidebook.  Patient learns different ways to obtain key vitamins and minerals, including through a recommended healthy diet. It is important to discuss all supplements you take with your doctor.   Healthy Mind-Set    Smoking Cessation  Clinical staff conducted group or individual video education with verbal and written material and guidebook.  Patient learns that cigarette smoking and tobacco addiction pose a serious health risk which affects millions of people. Stopping smoking will significantly reduce the risk of heart disease, lung disease,  and many forms of cancer. Recommended strategies for quitting are covered, including working with your doctor to develop a successful plan.  Culinary   Becoming a Financial trader conducted group or individual video  education with verbal and written material and guidebook.  Patient learns that cooking at home Ayala be healthy, cost-effective, quick, and puts them in control. Keys to cooking healthy recipes will include looking at your recipe, assessing your equipment needs, planning ahead, making it simple, choosing cost-effective seasonal ingredients, and limiting the use of added fats, salts, and sugars.  Cooking - Breakfast and Snacks  Clinical staff conducted group or individual video education with verbal and written material and guidebook.  Patient learns how important breakfast is to satiety and nutrition through the entire day. Recommendations include key foods to eat during breakfast to help stabilize blood sugar levels and to prevent overeating at meals later in the day. Planning ahead is also a key component.  Cooking - Human resources officer conducted group or individual video education with verbal and written material and guidebook.  Patient learns eating strategies to improve overall health, including an approach to cook more at home. Recommendations include thinking of animal protein as a side on your plate rather than center stage and focusing instead on lower calorie dense options like vegetables, fruits, whole grains, and plant-based proteins, such as beans. Making sauces in large quantities to freeze for later and leaving the skin on your vegetables are also recommended to maximize your experience.  Cooking - Healthy Salads and Dressing Clinical staff conducted group or individual video education with verbal and written material and guidebook.  Patient learns that vegetables, fruits, whole grains, and legumes are the foundations of the [place].  Recommendations include how to incorporate each of these in flavorful and healthy salads, and how to create homemade salad dressings. Proper handling of ingredients is also covered. Cooking - Soups and [employer] - Soups and Desserts Clinical staff conducted group or individual video education with verbal and written material and guidebook.  Patient learns that Pritikin soups and desserts make for easy, nutritious, and delicious snacks and meal components that are low in sodium, fat, sugar, and calorie density, while high in vitamins, minerals, and filling fiber. Recommendations include simple and healthy ideas for soups and desserts.   Overview     The Pritikin Solution Program Overview Clinical staff conducted group or individual video education with verbal and written material and guidebook.  Patient learns that the results of the [place] Program have been documented in more than 100 articles published in peer-reviewed journals, and the benefits include reducing risk factors for (and, in some cases, even reversing) high cholesterol, high blood pressure, type 2 diabetes, obesity, and more! An overview of the three key pillars of the Pritikin Program will be covered: eating well, doing regular exercise, and having a healthy mind-set.  WORKSHOPS  Exercise: Exercise Basics: Building Your Action Plan Clinical staff led group instruction and group discussion with PowerPoint presentation and patient guidebook. To enhance the learning environment the use of posters, models and videos may be added. At the conclusion of this workshop, patients will comprehend the difference between physical activity and exercise, as well as the benefits of incorporating both, into their routine. Patients will understand the FITT (Frequency, Intensity, Time, and Type) principle and how to use it to build an exercise action plan. In addition, safety concerns and other considerations for exercise and cardiac rehab  will be addressed by the presenter. The purpose of this lesson is to promote a comprehensive and effective weekly exercise routine in order to improve patients' overall level of fitness.   Managing Heart Disease: Your Path  to a Healthier Heart Clinical staff led group instruction and group discussion with PowerPoint presentation and patient guidebook. To enhance the learning environment the use of posters, models and videos may be added.At the conclusion of this workshop, patients will understand the anatomy and physiology of the heart. Additionally, they will understand how Pritikin's three pillars impact the risk factors, the progression, and the management of heart disease.  The purpose of this lesson is to provide a high-level overview of the heart, heart disease, and how the Pritikin lifestyle positively impacts risk factors.  Exercise Biomechanics Clinical staff led group instruction and group discussion with PowerPoint presentation and patient guidebook. To enhance the learning environment the use of posters, models and videos may be added. Patients will learn how the structural parts of their bodies function and how these functions impact their daily activities, movement, and exercise. Patients will learn how to promote a neutral spine, learn how to manage pain, and identify ways to improve their physical movement in order to promote healthy living. The purpose of this lesson is to expose patients to common physical limitations that impact physical activity. Participants will learn practical ways to adapt and manage aches and pains, and to minimize their effect on regular exercise. Patients will learn how to maintain good posture while sitting, walking, and lifting.  Balance Training and Fall Prevention  Clinical staff led group instruction and group discussion with PowerPoint presentation and patient guidebook. To enhance the learning environment the use of posters, models and videos  may be added. At the conclusion of this workshop, patients will understand the importance of their sensorimotor skills (vision, proprioception, and the vestibular system) in maintaining their ability to balance as they age. Patients will apply a variety of balancing exercises that are appropriate for their current level of function. Patients will understand the common causes for poor balance, possible solutions to these problems, and ways to modify their physical environment in order to minimize their fall risk. The purpose of this lesson is to teach patients about the importance of maintaining balance as they age and ways to minimize their risk of falling.  WORKSHOPS   Nutrition:  Fueling a Scientist, research (physical sciences) led group instruction and group discussion with PowerPoint presentation and patient guidebook. To enhance the learning environment the use of posters, models and videos may be added. Patients will review the foundational principles of the [place] and understand what constitutes a serving size in each of the food groups. Patients will also learn Pritikin-friendly foods that are better choices when away from home and review make-ahead meal and snack options. Calorie density will be reviewed and applied to three nutrition priorities: weight maintenance, weight loss, and weight gain. The purpose of this lesson is to reinforce (in a group setting) the key concepts around what patients are recommended to eat and how to apply these guidelines when away from home by planning and selecting Pritikin-friendly options. Patients will understand how calorie density may be adjusted for different weight management goals.  Mindful Eating  Clinical staff led group instruction and group discussion with PowerPoint presentation and patient guidebook. To enhance the learning environment the use of posters, models and videos may be added. Patients will briefly review the concepts of the [place] and the importance of low-calorie dense foods. The concept of mindful eating will be introduced as well as the importance of paying attention to internal hunger signals. Triggers for non-hunger eating and techniques for dealing with triggers  will be explored. The purpose of this lesson is to provide patients with the opportunity to review the basic principles of the [place], discuss the value of eating mindfully and how to measure internal cues of hunger and fullness using the Hunger Scale. Patients will also discuss reasons for non-hunger eating and learn strategies to use for controlling emotional eating.  Targeting Your Nutrition Priorities Clinical staff led group instruction and group discussion with PowerPoint presentation and patient guidebook. To enhance the learning environment the use of posters, models and videos may be added. Patients will learn how to determine their genetic susceptibility to disease by reviewing their family history. Patients will gain insight into the importance of diet as part of an overall healthy lifestyle in mitigating the impact of genetics and other environmental insults. The purpose of this lesson is to provide patients with the opportunity to assess their personal nutrition priorities by looking at their family history, their own health history and current risk factors. Patients will also be able to discuss ways of prioritizing and modifying the [place] for their highest risk areas  Menu  Clinical staff led group instruction and group discussion with PowerPoint presentation and patient guidebook. To enhance the learning environment the use of posters, models and videos may be added. Using menus brought in from [employer], or printed from [employer], patients will apply the [place] dining out guidelines that were presented in the [employer] video. Patients will also be able to practice these guidelines  in a variety of provided scenarios. The purpose of this lesson is to provide patients with the opportunity to practice hands-on learning of the [place] with actual menus and practice scenarios.  Label Reading Clinical staff led group instruction and group discussion with PowerPoint presentation and patient guidebook. To enhance the learning environment the use of posters, models and videos may be added. Patients will review and discuss the Pritikin label reading guidelines presented in Pritikin's Label Reading Educational series video. Using fool labels brought in from local grocery stores and markets, patients will apply the label reading guidelines and determine if the packaged food meet the Pritikin guidelines. The purpose of this lesson is to provide patients with the opportunity to review, discuss, and practice hands-on learning of the Pritikin Label Reading guidelines with actual packaged food labels. [place] Workshops are designed to teach patients ways to prepare quick, simple, and affordable recipes at home. The importance of nutrition's role in chronic disease risk reduction is reflected in its emphasis in the overall Pritikin program. By learning how to prepare essential core Pritikin Eating Plan recipes, patients will increase control over what they eat; be able to customize the flavor of foods without the use of added salt, sugar, or fat; and improve the quality of the food they consume. By learning a set of core recipes which are easily assembled, quickly prepared, and affordable, patients are more likely to prepare more healthy foods at home. These workshops focus on convenient breakfasts, simple entres, side dishes, and desserts which Ayala be prepared with minimal effort and are consistent with nutrition recommendations for cardiovascular risk reduction. Cooking [employer] are taught by a Engineer, materials (RD) who has  been trained by the [employer]. The chef or RD has a clear understanding of the importance of minimizing - if not completely eliminating - added fat, sugar, and sodium in recipes. Throughout the series of  Cooking School Workshop sessions, patients will learn about healthy ingredients and efficient methods of cooking to build confidence in their capability to prepare    [employer] weekly topics:  Adding Flavor- Sodium-Free  Fast and Healthy Breakfasts  Powerhouse Plant-Based Proteins  Satisfying Salads and Dressings  Simple Sides and Sauces  International Cuisine-Spotlight on the [place] Zones  Delicious Desserts  Savory Soups  Efficiency Cooking - Meals in a Snap  Tasty Appetizers and Snacks  Comforting Weekend Breakfasts  One-Pot Wonders   Fast Evening Meals  Easy Entertaining  Personalizing Your Pritikin Plate  WORKSHOPS   Healthy Mindset (Psychosocial): New Thoughts, New Behaviors Clinical staff led group instruction and group discussion with PowerPoint presentation and patient guidebook. To enhance the learning environment the use of posters, models and videos may be added. Patients will learn and practice techniques for developing effective health and lifestyle goals. Patients will be able to effectively apply the goal setting process learned to develop at least one new personal goal.  The purpose of this lesson is to expose patients to a new skill set of behavior modification techniques such as techniques setting SMART goals, overcoming barriers, and achieving new thoughts and new behaviors.  Managing Moods and Relationships Clinical staff led group instruction and group discussion with PowerPoint presentation and patient guidebook. To enhance the learning environment the use of posters, models and videos may be added. Patients will learn how emotional and chronic stress factors Ayala impact their health and relationships. They will learn healthy ways to manage their  moods and utilize positive coping mechanisms. In addition, ICR patients will learn ways to improve communication skills. The purpose of this lesson is to expose patients to ways of understanding how one's mood and health are intimately connected. Developing a healthy outlook Ayala help build positive relationships and connections with others. Patients will understand the importance of utilizing effective communication skills that include actively listening and being heard. They will learn and understand the importance of the "4 Cs" and especially Connections in fostering of a Healthy Mind-Set.  Healthy Sleep for a Healthy Heart Clinical staff led group instruction and group discussion with PowerPoint presentation and patient guidebook. To enhance the learning environment the use of posters, models and videos may be added. At the conclusion of this workshop, patients will be able to demonstrate knowledge of the importance of sleep to overall health, well-being, and quality of life. They will understand the symptoms of, and treatments for, common sleep disorders. Patients will also be able to identify daytime and nighttime behaviors which impact sleep, and they will be able to apply these tools to help manage sleep-related challenges. The purpose of this lesson is to provide patients with a general overview of sleep and outline the importance of quality sleep. Patients will learn about a few of the most common sleep disorders. Patients will also be introduced to the concept of "sleep hygiene," and discover ways to self-manage certain sleeping problems through simple daily behavior changes. Finally, the workshop will motivate patients by clarifying the links between quality sleep and their goals of heart-healthy living.   Recognizing and Reducing Stress Clinical staff led group instruction and group discussion with PowerPoint presentation and patient guidebook. To enhance the learning environment the use of  posters, models and videos may be added. At the conclusion of this workshop, patients will be able to understand the types of stress reactions, differentiate between acute and chronic stress, and recognize the impact that chronic stress has on their  health. They will also be able to apply different coping mechanisms, such as reframing negative self-talk. Patients will have the opportunity to practice a variety of stress management techniques, such as deep abdominal breathing, progressive muscle relaxation, and/or guided imagery.  The purpose of this lesson is to educate patients on the role of stress in their lives and to provide healthy techniques for coping with it.  Learning Barriers/Preferences:  Learning Barriers/Preferences - 11/01/22 1314       Learning Barriers/Preferences   Learning Barriers None    Learning Preferences Computer/Internet;Audio;Group Instruction;Individual Instruction;Pictoral;Skilled Demonstration;Video;Written Material;Verbal Instruction             Education Topics:  Knowledge Questionnaire Score:  Knowledge Questionnaire Score - 11/01/22 1314       Knowledge Questionnaire Score   Pre Score 19/24             Core Components/Risk Factors/Patient Goals at Admission:  Personal Goals and Risk Factors at Admission - 11/01/22 1315       Core Components/Risk Factors/Patient Goals on Admission    Weight Management Yes;Weight Loss    Intervention Weight Management: Develop a combined nutrition and exercise program designed to reach desired caloric intake, while maintaining appropriate intake of nutrient and fiber, sodium and fats, and appropriate energy expenditure required for the weight goal.;Weight Management: Provide education and appropriate resources to help participant work on and attain dietary goals.    Admit Weight 173 lb 8 oz (78.7 kg)    Goal Weight: Short Term 155 lb (70.3 kg)    Expected Outcomes Short Term: Continue to assess and modify  interventions until short term weight is achieved;Long Term: Adherence to nutrition and physical activity/exercise program aimed toward attainment of established weight goal;Weight Loss: Understanding of general recommendations for a balanced deficit meal plan, which promotes 1-2 lb weight loss per week and includes a negative energy balance of [id number] kcal/d;Understanding of distribution of calorie intake throughout the day with the consumption of 4-5 meals/snacks;Understanding recommendations for meals to include 15-35% energy as protein, 25-35% energy from fat, 35-60% energy from carbohydrates, less than '200mg'$  of dietary cholesterol, 20-35 gm of total fiber daily    Heart Failure Yes    Intervention Provide a combined exercise and nutrition program that is supplemented with education, support and counseling about heart failure. Directed toward relieving symptoms such as shortness of breath, decreased exercise tolerance, and extremity edema.    Expected Outcomes Improve functional capacity of life;Short term: Attendance in program 2-3 days a week with increased exercise capacity. Reported lower sodium intake. Reported increased fruit and vegetable intake. Reports medication compliance.;Short term: Daily weights obtained and reported for increase. Utilizing diuretic protocols set by physician.;Long term: Adoption of self-care skills and reduction of barriers for early signs and symptoms recognition and intervention leading to self-care maintenance.    Hypertension Yes    Intervention Provide education on lifestyle modifcations including regular physical activity/exercise, weight management, moderate sodium restriction and increased consumption of fresh fruit, vegetables, and low fat dairy, alcohol moderation, and smoking cessation.;Monitor prescription use compliance.    Expected Outcomes Short Term: Continued assessment and intervention until BP is < 140/48m HG in hypertensive participants. < 130/825mHG  in hypertensive participants with diabetes, heart failure or chronic kidney disease.;Long Term: Maintenance of blood pressure at goal levels.    Stress Yes    Intervention Offer individual and/or small group education and counseling on adjustment to heart disease, stress management and health-related lifestyle change. Teach and support self-help strategies.;Refer  participants experiencing significant psychosocial distress to appropriate mental health specialists for further evaluation and treatment. When possible, include family members and significant others in education/counseling sessions.    Expected Outcomes Short Term: Participant demonstrates changes in health-related behavior, relaxation and other stress management skills, ability to obtain effective social support, and compliance with psychotropic medications if prescribed.;Long Term: Emotional wellbeing is indicated by absence of clinically significant psychosocial distress or social isolation.             Core Components/Risk Factors/Patient Goals Review:   Goals and Risk Factor Review     Row Name 11/14/22 0814 11/21/22 0912           Core Components/Risk Factors/Patient Goals Review   Personal Goals Review Weight Management/Obesity;Hypertension;Lipids;Stress Weight Management/Obesity;Hypertension;Lipids;Stress      Review Monte started intensive cardiac rehab on 11/13/22. Sae did well with exercise for her fitness level as Yatziry is somewhat deconditoned. Diastolic blood pressure in the 90's . Forwarded to heart failure clinic. Having difficulty with affording medications Caral started intensive cardiac rehab on 11/13/22. Justus is off to a good start to exercise. Blood pressures have improved. Ayva is enjoying participating in group exercise.      Expected Outcomes Bellina will continue to participate in intensive cardiac rehab for exercise, nutrition and lifestyle modifications Jodilynn will continue to participate in intensive  cardiac rehab for exercise, nutrition and lifestyle modifications               Core Components/Risk Factors/Patient Goals at Discharge (Final Review):   Goals and Risk Factor Review - 11/21/22 0912       Core Components/Risk Factors/Patient Goals Review   Personal Goals Review Weight Management/Obesity;Hypertension;Lipids;Stress    Review Zaaliyah started intensive cardiac rehab on 11/13/22. Jeronda is off to a good start to exercise. Blood pressures have improved. Shmya is enjoying participating in group exercise.    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 Introduction to pritikin education/ intensive cardiac rehab. Initial orientation packet reviewed with patient. 30 Day ITP Review. Krishawna started intensive cardiac rehab on 11/13/22. Zhariah did well with exercise for her fitness level 30 Day ITP Review. Siane started intensive cardiac rehab on 11/13/22. Olivia Mackie              Comments: See ITP comment.

## 2022-11-22 ENCOUNTER — Encounter (HOSPITAL_COMMUNITY): Payer: Medicaid Other

## 2022-11-22 ENCOUNTER — Telehealth (HOSPITAL_COMMUNITY): Payer: Self-pay | Admitting: *Deleted

## 2022-11-22 NOTE — Telephone Encounter (Signed)
Patient left message on department voicemail, she will be out today, no energy. Plans to return on Monday.

## 2022-11-24 ENCOUNTER — Encounter (HOSPITAL_COMMUNITY): Payer: Medicaid Other

## 2022-11-24 ENCOUNTER — Encounter: Payer: Self-pay | Admitting: Emergency Medicine

## 2022-11-24 ENCOUNTER — Ambulatory Visit: Payer: Medicaid Other | Attending: Critical Care Medicine | Admitting: Pharmacist

## 2022-11-24 DIAGNOSIS — Z952 Presence of prosthetic heart valve: Secondary | ICD-10-CM

## 2022-11-24 DIAGNOSIS — Z9889 Other specified postprocedural states: Secondary | ICD-10-CM

## 2022-11-24 LAB — POCT INR: POC INR: 2

## 2022-11-27 ENCOUNTER — Encounter (HOSPITAL_COMMUNITY): Payer: Medicaid Other

## 2022-11-27 ENCOUNTER — Telehealth (HOSPITAL_COMMUNITY): Payer: Self-pay | Admitting: *Deleted

## 2022-11-29 ENCOUNTER — Encounter (HOSPITAL_COMMUNITY): Payer: Medicaid Other

## 2022-11-30 ENCOUNTER — Other Ambulatory Visit: Payer: Self-pay | Admitting: Critical Care Medicine

## 2022-11-30 ENCOUNTER — Other Ambulatory Visit (HOSPITAL_COMMUNITY): Payer: Self-pay

## 2022-11-30 ENCOUNTER — Other Ambulatory Visit: Payer: Self-pay

## 2022-11-30 ENCOUNTER — Other Ambulatory Visit: Payer: Self-pay | Admitting: Pharmacist

## 2022-11-30 ENCOUNTER — Telehealth: Payer: Self-pay | Admitting: Critical Care Medicine

## 2022-11-30 DIAGNOSIS — Z9889 Other specified postprocedural states: Secondary | ICD-10-CM

## 2022-11-30 MED ORDER — WARFARIN SODIUM 4 MG PO TABS
4.0000 mg | ORAL_TABLET | Freq: Every day | ORAL | 2 refills | Status: DC
  Filled 2022-11-30: qty 30, 30d supply, fill #0
  Filled 2022-12-04: qty 90, 90d supply, fill #0

## 2022-11-30 NOTE — Telephone Encounter (Signed)
Medication Refill - Medication: warfarin (COUMADIN) 4 MG tablet [id number]   Has the patient contacted their pharmacy? Yes.     (Agent: If yes, when and what did the pharmacy advise?) Pharmacy said to contact the office  Preferred Pharmacy (with phone number or street name): [facility]   Has the patient been seen for an appointment in the last year OR does the patient have an upcoming appointment? Yes.    Agent: Please be advised that RX refills may take up to 3 business days. We ask that you follow-up with your pharmacy.

## 2022-11-30 NOTE — Telephone Encounter (Signed)
Requested medication (s) are due for refill today: yes  Requested medication (s) are on the active medication list: yes  Last refill:  08/29/22  Future visit scheduled: yes  Notes to clinic:   Manual Review: If patient's warfarin is managed by Anti-Coag team, route request to them. If not, route request to the provider.      Requested Prescriptions  Pending Prescriptions Disp Refills   warfarin (COUMADIN) 4 MG tablet 30 tablet 2    Sig: Take 1 tablet (4 mg total) by mouth daily.     Hematology:  Anticoagulants - warfarin Failed - 11/30/2022  9:01 AM      Failed - Manual Review: If patient's warfarin is managed by Anti-Coag team, route request to them. If not, route request to the provider.      Passed - INR in normal range and within 30 days    POC INR  Date Value Ref Range Status  11/24/2022 2.0  Final         Passed - HCT in normal range and within 360 days    HCT  Date Value Ref Range Status  07/10/2022 38.2 36.0 - 46.0 % Final   Hematocrit  Date Value Ref Range Status  05/23/2022 32.5 (L) 34.0 - 46.6 % Final         Passed - Patient is not pregnant      Passed - Valid encounter within last 3 months    Recent Outpatient Visits           3 weeks ago Chronic diastolic heart failure [facility])   [place], [place], [place]   6 months ago Chronic diastolic heart failure [facility])   [place] Elsie Stain, [place]   7 months ago Severe mitral regurgitation   [place] Elsie Stain, MD

## 2022-11-30 NOTE — Telephone Encounter (Signed)
Already requested by pharmacy today, this is a duplicate request.

## 2022-12-01 ENCOUNTER — Telehealth (HOSPITAL_COMMUNITY): Payer: Self-pay | Admitting: *Deleted

## 2022-12-01 ENCOUNTER — Encounter (HOSPITAL_COMMUNITY): Payer: Medicaid Other

## 2022-12-04 ENCOUNTER — Other Ambulatory Visit: Payer: Self-pay

## 2022-12-04 ENCOUNTER — Other Ambulatory Visit (HOSPITAL_COMMUNITY): Payer: Self-pay

## 2022-12-04 ENCOUNTER — Encounter (HOSPITAL_COMMUNITY)
Admission: RE | Admit: 2022-12-04 | Discharge: 2022-12-04 | Disposition: A | Discharge status: Home / Self Care | Payer: Medicaid Other | Source: Ambulatory Visit | Attending: Internal Medicine | Admitting: Internal Medicine

## 2022-12-04 DIAGNOSIS — Z9889 Other specified postprocedural states: Secondary | ICD-10-CM | POA: Diagnosis present

## 2022-12-04 DIAGNOSIS — Z952 Presence of prosthetic heart valve: Secondary | ICD-10-CM | POA: Insufficient documentation

## 2022-12-04 DIAGNOSIS — Z48812 Encounter for surgical aftercare following surgery on the circulatory system: Secondary | ICD-10-CM | POA: Diagnosis not present

## 2022-12-06 ENCOUNTER — Encounter (HOSPITAL_COMMUNITY)
Admission: RE | Admit: 2022-12-06 | Discharge: 2022-12-06 | Disposition: A | Discharge status: Home / Self Care | Payer: Medicaid Other | Source: Ambulatory Visit | Attending: Internal Medicine | Admitting: Internal Medicine

## 2022-12-06 DIAGNOSIS — Z952 Presence of prosthetic heart valve: Secondary | ICD-10-CM

## 2022-12-06 DIAGNOSIS — Z9889 Other specified postprocedural states: Secondary | ICD-10-CM

## 2022-12-08 ENCOUNTER — Encounter (HOSPITAL_COMMUNITY): Payer: Medicaid Other

## 2022-12-11 ENCOUNTER — Encounter (HOSPITAL_COMMUNITY): Payer: Medicaid Other

## 2022-12-13 ENCOUNTER — Encounter (HOSPITAL_COMMUNITY): Payer: Medicaid Other

## 2022-12-13 NOTE — Progress Notes (Addendum)
Cardiac Individual Treatment Plan  Patient Details  Name: Lisa Ayala MRN: [id number] Date of Birth: March 18, 1966 Referring Provider:   Flowsheet Row CARDIAC REHAB PHASE II ORIENTATION from 11/01/2022 in [facility], & [place]  Referring Provider Glori Bickers, MD       Initial Encounter Date:  [place] PHASE II ORIENTATION from 11/01/2022 in [facility]  Date 11/01/22       Visit Diagnosis: S/P mitral valve replacement  S/P tricuspid valve repair  Patient's Home Medications on Admission:  Current Outpatient Medications:    aspirin 81 MG chewable tablet, Chew 1 tablet (81 mg total) by mouth daily. (Patient not taking: Reported on 11/13/2022), Disp: 100 tablet, Rfl: 2   dapagliflozin propanediol (FARXIGA) 10 MG TABS tablet, Take 1 tablet (10 mg total) by mouth daily., Disp: 90 tablet, Rfl: 3   magnesium oxide (MAG-OX) 400 MG tablet, Take 1 tablet (400 mg total) by mouth 2 (two) times daily. 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 08/2021 Echo: EF 70-75%, no rwma, mod dil LV, GrII DD, nl RV fxn, massively dil LA, mild-mod dil RA, severe MR due to lack of central coaptation of MV leaflets. Mild MS. Mod TR. Mild-mod AI.    Tricuspid regurgitation     Tobacco Use: Social History   Tobacco Use  Smoking Status Never  Smokeless Tobacco Never    Labs: Review Flowsheet  More data exists      Latest Ref Rng & Units 02/16/2022 02/17/2022 02/18/2022 02/19/2022 02/20/2022  Labs for ITP Cardiac and Pulmonary Rehab  Trlycerides <150 mg/dL - 109  - - 64   O2 Saturation % 71.1  66  87.2  70.2  -    Capillary Blood Glucose: Lab Results  Component Value Date   GLUCAP 99 02/22/2022   GLUCAP 150 (H) 02/20/2022   GLUCAP 99 02/20/2022   GLUCAP 90 02/20/2022   GLUCAP 140 (H) 02/20/2022     Exercise Target Goals: Exercise Program Goal: Individual exercise prescription set using results from initial 6 min walk test and THRR while considering  patient's activity barriers and safety.   Exercise Prescription Goal: Initial exercise prescription builds to 30-45 minutes a day of aerobic activity, 2-3 days per week.  Home exercise guidelines will be given to patient during program as part of exercise prescription that the participant will acknowledge.  Activity Barriers & Risk Stratification:  Activity Barriers & Cardiac Risk Stratification - 11/01/22 1304       Activity Barriers & Cardiac Risk Stratification   Activity Barriers Back Problems;Decreased Ventricular Function;Deconditioning;Assistive Device   pt has scoliosis, uses walker with long distances   Cardiac Risk Stratification High   <5 METS on 6MWT  6 Minute Walk:  6 Minute Walk     Row Name 11/01/22 1303         6 Minute Walk   Phase Initial     Distance 1504 feet     Walk Time 6 minutes     # of Rest Breaks 0     MPH 2.84     METS 4.1     RPE 11     Perceived Dyspnea  0     VO2 Peak 14.34     Symptoms No     Resting HR 68 bpm     Resting BP 142/88     Resting Oxygen Saturation  98 %     Exercise Oxygen Saturation  during 6 min walk 100 %     Max Ex. HR 102 bpm     Max Ex. BP 152/86     2 Minute Post BP 140/84               Oxygen Initial Assessment:   Oxygen Re-Evaluation:   Oxygen Discharge (Final Oxygen Re-Evaluation):   Initial Exercise Prescription:  Initial Exercise Prescription - 11/01/22 1300       Date of Initial Exercise RX and Referring Provider   Date 11/01/22    Referring Provider Glori Bickers, MD    Expected Discharge Date 01/12/23      NuStep   Level 2    SPM 70    Minutes 15    METs 3      Track   Laps 17    Minutes 15    METs 3      Prescription Details   Frequency (times per week) 3    Duration Progress to 30 minutes of continuous aerobic without signs/symptoms of physical distress      Intensity   THRR 40-80% of Max Heartrate 65-131    Ratings of Perceived Exertion 11-13    Perceived Dyspnea 0-4      Progression   Progression Continue to progress workloads to maintain intensity without signs/symptoms of physical distress.      Resistance Training   Training Prescription Yes    Weight 2    Reps 10-15             Perform Capillary Blood Glucose checks as needed.  Exercise Prescription Changes:   Exercise Prescription Changes     Row Name 11/13/22 1500             Response to Exercise   Blood Pressure (Admit) 152/90       Blood Pressure (Exercise) 158/90       Blood Pressure (Exit) 140/90       Heart Rate (Admit) 85 bpm       Heart Rate (Exercise) 109 bpm       Rating of Perceived Exertion (Exercise) 11       Symptoms None       Comments Pt's first day in the [facility] program       Duration Continue with 30 min of aerobic exercise without signs/symptoms of physical distress.       Intensity THRR unchanged         Progression   Progression Continue to progress workloads to maintain intensity without signs/symptoms of physical distress.       Average METs 2.3         Resistance Training   Training Prescription Yes       Weight 2 lbs  Reps 10-15       Time 10 Minutes         Interval Training   Interval Training No          NuStep   Level 2       SPM 65       Minutes 15       METs 1.9         Track   Laps 14       Minutes 15       METs 2.79                Exercise Comments:   Exercise Comments     Row Name 11/13/22 1538 12/11/22 1500         Exercise Comments Pt's first day in the [place] program. Pt had no complaints other than feeling tired. Pt due for MET and goal review. Pt's attendance has been sporadiac. Will review upon her return to the [place] program.               Exercise Goals and Review:   Exercise Goals     Row Name 11/01/22 1307             Exercise Goals   Increase Physical Activity Yes       Intervention Provide advice, education, support and counseling about physical activity/exercise needs.;Develop an individualized exercise prescription for aerobic and resistive training based on initial evaluation findings, risk stratification, comorbidities and participant's personal goals.       Expected Outcomes Short Term: Attend rehab on a regular basis to increase amount of physical activity.;Long Term: Exercising regularly at least 3-5 days a week.;Long Term: Add in home exercise to make exercise part of routine and to increase amount of physical activity.       Increase Strength and Stamina Yes       Intervention Provide advice, education, support and counseling about physical activity/exercise needs.;Develop an individualized exercise prescription for aerobic and resistive training based on initial evaluation findings, risk stratification, comorbidities and participant's personal goals.       Expected Outcomes Short Term: Increase workloads from initial exercise prescription for resistance, speed, and METs.;Long Term: Improve cardiorespiratory fitness, muscular endurance and strength as measured by increased METs and functional capacity (6MWT);Short Term: Perform resistance training exercises routinely during rehab and add in resistance training at home       Able to  understand and use rate of perceived exertion (RPE) scale Yes       Intervention Provide education and explanation on how to use RPE scale       Expected Outcomes Short Term: Able to use RPE daily in rehab to express subjective intensity level;Long Term:  Able to use RPE to guide intensity level when exercising independently       Knowledge and understanding of Target Heart Rate Range (THRR) Yes       Intervention Provide education and explanation of THRR including how the numbers were predicted and where they are located for reference       Expected Outcomes Short Term: Able to state/look up THRR;Short Term: Able to use daily as guideline for intensity in rehab;Long Term: Able to use THRR to govern intensity when exercising independently       Understanding of Exercise Prescription Yes       Intervention Provide education, explanation, and written materials on patient's individual exercise prescription       Expected Outcomes Short Term: Able to  explain program exercise prescription;Long Term: Able to explain home exercise prescription to exercise independently                Exercise Goals Re-Evaluation :  Exercise Goals Re-Evaluation     Row Name 11/13/22 1536             Exercise Goal Re-Evaluation   Exercise Goals Review Increase Physical Activity;Increase Strength and Stamina;Able to understand and use rate of perceived exertion (RPE) scale;Knowledge and understanding of Target Heart Rate Range (THRR);Understanding of Exercise Prescription       Comments Pt's first day in the [facility] program. Pt undersntads the exercise Rx, RPE sclae and THRR.       Expected Outcomes Will continue to monitor patient and progress exercise workloads as tolerated.                Discharge Exercise Prescription (Final Exercise Prescription Changes):  Exercise Prescription Changes - 11/13/22 1500       Response to Exercise   Blood Pressure (Admit) 152/90    Blood Pressure (Exercise) 158/90     Blood Pressure (Exit) 140/90    Heart Rate (Admit) 85 bpm    Heart Rate (Exercise) 109 bpm    Rating of Perceived Exertion (Exercise) 11    Symptoms None    Comments Pt's first day in the [facility] program    Duration Continue with 30 min of aerobic exercise without signs/symptoms of physical distress.    Intensity THRR unchanged      Progression   Progression Continue to progress workloads to maintain intensity without signs/symptoms of physical distress.    Average METs 2.3      Resistance Training   Training Prescription Yes    Weight 2 lbs    Reps 10-15    Time 10 Minutes      Interval Training   Interval Training No      NuStep   Level 2    SPM 65    Minutes 15    METs 1.9      Track   Laps 14    Minutes 15    METs 2.79             Nutrition:  Target Goals: Understanding of nutrition guidelines, daily intake of sodium 1500mg , cholesterol 200mg , calories 30% from fat and 7% or less from saturated fats, daily to have 5 or more servings of fruits and vegetables.  Biometrics:  Pre Biometrics - 11/01/22 1055       Pre Biometrics   Waist Circumference 38 inches    Hip Circumference 41 inches    Waist to Hip Ratio 0.93 %    Triceps Skinfold 38 mm    % Body Fat 40.8 %    Grip Strength 30 kg    Flexibility 20 in    Single Leg Stand 22.06 seconds              Nutrition Therapy Plan and Nutrition Goals:  Nutrition Therapy & Goals - 12/11/22 1441       Nutrition Therapy   Diet Heart Healthy Diet    Drug/Food Interactions Coumadin/Vit K      Personal Nutrition Goals   Nutrition Goal Patient to identify strategies for reducing cardiovascular risk by attending the weekly Pritikin education and nutrition series    Personal Goal #2 Patient to improve diet quality by using the plate method as a daily guide for meal planning to include lean protein/plant protein, fruits, vegetables, whole  grains, nonfat dairy as part of well balanced diet    Personal Goal #3  Patient to identify strategies for weight loss of 0.5-2.0# per week.    Personal Goal #4 Patient to reduce sodium to 1500mg  per day.    Comments Lisa Ayala is in the contemplation stage of change. Her attendance to intensive cardiac rehab remains variable, and she attended <4 Pritikin education sessions. She will benefit from participation in intensive cardiac rehab for nutrition, exercise, and lifestyle modification and adherance to the Pritikin eating plan.      Intervention Plan   Intervention Prescribe, educate and counsel regarding individualized specific dietary modifications aiming towards targeted core components such as weight, hypertension, lipid management, diabetes, heart failure and other comorbidities.;Nutrition handout(s) given to patient.    Expected Outcomes Short Term Goal: Understand basic principles of dietary content, such as calories, fat, sodium, cholesterol and nutrients.;Long Term Goal: Adherence to prescribed nutrition plan.             Nutrition Assessments:  MEDIFICTS Score Key: ?70 Need to make dietary changes  40-70 Heart Healthy Diet ? 40 Therapeutic Level Cholesterol Diet    Picture Your Plate Scores: D34-534 Unhealthy dietary pattern with much room for improvement. 41-50 Dietary pattern unlikely to meet recommendations for good health and room for improvement. 51-60 More healthful dietary pattern, with some room for improvement.  >60 Healthy dietary pattern, although there may be some specific behaviors that could be improved.    Nutrition Goals Re-Evaluation:  Nutrition Goals Re-Evaluation     Lisa Ayala Name 11/13/22 1445 12/11/22 1441           Goals   Current Weight 169 lb 8.5 oz (76.9 kg) 174 lb 2.6 oz (79 kg)  weight from 3/13, last attended CR session      Comment Cr 1.34, GFR 46, A1c WNL, Triglycerides WNL Cr 1.34, GFR 46, A1c WNL, Triglycerides WNL; she continues follow-up anti coag clinic      Expected Outcome Lisa Ayala is motivated to lose weight to  ~150#. She will benefit from participation in intensive cardiac rehab for nutrition, exercise, and lifestyle modification. Lisa Ayala is in the contemplation stage of change. Her attendance to intensive cardiac rehab remains variable, and she attended <4 Pritikin education sessions. She will benefit from participation in intensive cardiac rehab for nutrition, exercise, and lifestyle modification and adherance to the Pritikin eating plan.               Nutrition Goals Re-Evaluation:  Nutrition Goals Re-Evaluation     [facility] Name 11/13/22 1445 12/11/22 1441           Goals   Current Weight 169 lb 8.5 oz (76.9 kg) 174 lb 2.6 oz (79 kg)  weight from 3/13, last attended CR session      Comment Cr 1.34, GFR 46, A1c WNL, Triglycerides WNL Cr 1.34, GFR 46, A1c WNL, Triglycerides WNL; she continues follow-up anti coag clinic      Expected Outcome Lisa Ayala is motivated to lose weight to ~150#. She will benefit from participation in intensive cardiac rehab for nutrition, exercise, and lifestyle modification. Lisa Ayala is in the contemplation stage of change. Her attendance to intensive cardiac rehab remains variable, and she attended <4 Pritikin education sessions. She will benefit from participation in intensive cardiac rehab for nutrition, exercise, and lifestyle modification and adherance to the Pritikin eating plan.               Nutrition Goals Discharge (Final Nutrition Goals Re-Evaluation):  Nutrition Goals Re-Evaluation - 12/11/22 1441       Goals   Current Weight 174 lb 2.6 oz (79 kg)   weight from 3/13, last attended CR session   Comment Cr 1.34, GFR 46, A1c WNL, Triglycerides WNL; she continues follow-up anti coag clinic    Expected Outcome Lisa Ayala is in the contemplation stage of change. Her attendance to intensive cardiac rehab remains variable, and she attended <4 Pritikin education sessions. She will benefit from participation in intensive cardiac rehab for nutrition, exercise, and lifestyle  modification and adherance to the Pritikin eating plan.             Psychosocial: Target Goals: Acknowledge presence or absence of significant depression and/or stress, maximize coping skills, provide positive support system. Participant is able to verbalize types and ability to use techniques and skills needed for reducing stress and depression.  Initial Review & Psychosocial Screening:  Initial Psych Review & Screening - 11/01/22 1308       Initial Review   Current issues with Current Stress Concerns    Source of Stress Concerns Chronic Illness;Financial;Occupation;Unable to participate in former interests or hobbies    Comments Lisa Ayala admits to having some stress in her life currently due to her recent heart diagnosis and financial reasons. Lisa Ayala shared that she has not worked since 2022 and has been living on disability. This has caused stress with paying for bills and living expenses. Lisa Ayala also shared that her recent diagnosis has kept her from doing things she enjoyed prior to surgery. Reassurance and support was offered to Lisa Ayala, she denied any further need for resources.      Family Dynamics   Good Support System? Yes   Lisa Ayala has her daughter for support     Barriers   Psychosocial barriers to participate in program The patient should benefit from training in stress management and relaxation.      Screening Interventions   Interventions Encouraged to exercise;To provide support and resources with identified psychosocial needs;Provide feedback about the scores to participant    Expected Outcomes Long Term Goal: Stressors or current issues are controlled or eliminated.;Short Term goal: Identification and review with participant of any Quality of Life or Depression concerns found by scoring the questionnaire.;Long Term goal: The participant improves quality of Life and PHQ9 Scores as seen by post scores and/or verbalization of changes             Quality of Life Scores:   Quality of Life - 11/01/22 1313       Quality of Life   Select Quality of Life      Quality of Life Scores   Health/Function Pre 3.69 %    Socioeconomic Pre 18 %    Psych/Spiritual Pre 6.86 %    Family Pre 16 %    GLOBAL Pre 8.69 %            Scores of 19 and below usually indicate a poorer quality of life in these areas.  A difference of  2-3 points is a clinically meaningful difference.  A difference of 2-3 points in the total score of the Quality of Life Index has been associated with significant improvement in overall quality of life, self-image, physical symptoms, and general health in studies assessing change in quality of life.  PHQ-9: Review Flowsheet  More data may exist      11/08/2022 11/01/2022 08/09/2022 06/07/2022 04/11/2022  Depression screen PHQ 2/9  Decreased Interest 0 0 0 0 0  Down, Depressed, Hopeless 0 1 0 0 0  PHQ - 2 Score 0 1 0 0 0  Altered sleeping 0 0 - 0 -  Tired, decreased energy 1 1 - 0 -  Change in appetite 1 0 - 0 -  Feeling bad or failure about yourself  0 1 - 0 -  Trouble concentrating 0 0 - 0 -  Moving slowly or fidgety/restless 0 0 - 0 -  Suicidal thoughts 0 0 - 0 -  PHQ-9 Score 2 3 - 0 -  Difficult doing work/chores - Somewhat difficult - Not difficult at all -   Interpretation of Total Score  Total Score Depression Severity:  1-4 = Minimal depression, 5-9 = Mild depression, 10-14 = Moderate depression, 15-19 = Moderately severe depression, 20-27 = Severe depression   Psychosocial Evaluation and Intervention:   Psychosocial Re-Evaluation:  Psychosocial Re-Evaluation     Row Name 11/14/22 [phone] 11/21/22 0858 12/13/22 1726         Psychosocial Re-Evaluation   Current issues with Current Stress Concerns;Current Depression Current Stress Concerns;Current Depression Current Stress Concerns;Current Depression     Comments Reviewed quality of life questionnaire with the patient. Forwarded to primary care provider. Admits to being depressed  due to health and financial restraints. Admits to being depressed due to health and financial restraints. Lisa Ayala was able to get assistance with getting medications. Lisa Ayala says she feels better knowing that she Ayala get her medications and not worry about cost. Lisa Ayala has not voiced any increased concerns or stressors at intensive cardiac rehab. Lisa Ayala is out currently as her daughter just had a baby     Expected Outcomes Lisa Ayala will have controlled stress, decreased depression upon completion of intensive cardiac rehab. Lisa Ayala will have controlled stress, decreased depression upon completion of intensive cardiac rehab. Lisa Ayala will have controlled stress, decreased depression upon completion of intensive cardiac rehab.     Interventions Stress management education;Relaxation education;Encouraged to attend Cardiac Rehabilitation for the exercise Stress management education;Relaxation education;Encouraged to attend Cardiac Rehabilitation for the exercise Stress management education;Relaxation education;Encouraged to attend Cardiac Rehabilitation for the exercise     Continue Psychosocial Services  Follow up required by staff Follow up required by staff Follow up required by staff       Initial Review   Source of Stress Concerns Chronic Illness;Financial;Unable to participate in former interests or hobbies;Unable to perform yard/household activities Chronic Illness;Financial;Unable to participate in former interests or hobbies;Unable to perform yard/household activities Chronic Illness;Financial;Unable to participate in former interests or hobbies;Unable to perform yard/household activities     Comments will continue to monitor and offer support as needed will continue to monitor and offer support as needed will continue to monitor and offer support as needed              Psychosocial Discharge (Final Psychosocial Re-Evaluation):  Psychosocial Re-Evaluation - 12/13/22 1726       Psychosocial Re-Evaluation    Current issues with Current Stress Concerns;Current Depression    Comments Lisa Ayala has not voiced any increased concerns or stressors at intensive cardiac rehab. Lisa Ayala is out currently as her daughter just had a baby    Expected Outcomes Lisa Ayala will have controlled stress, decreased depression upon completion of intensive cardiac rehab.    Interventions Stress management education;Relaxation education;Encouraged to attend Cardiac Rehabilitation for the exercise    Continue Psychosocial Services  Follow up required by staff      Initial Review   Source of Stress Concerns Chronic Illness;Financial;Unable to  participate in former interests or hobbies;Unable to perform yard/household activities    Comments will continue to monitor and offer support as needed             Vocational Rehabilitation: Provide vocational rehab assistance to qualifying candidates.   Vocational Rehab Evaluation & Intervention:  Vocational Rehab - 11/01/22 1314       Initial Vocational Rehab Evaluation & Intervention   Assessment shows need for Vocational Rehabilitation No   Lisa Ayala has not worked since 2022, however she denies any need for vocational rehab at this time            Education: Education Goals: Education classes will be provided on a weekly basis, covering required topics. Participant will state understanding/return demonstration of topics presented.    Education     Row Name 11/13/22 1500     Education   Cardiac Education Topics Pritikin   Academic librarian Exercise Education   Exercise Education Biomechanial Limitations   Instruction Review Code 1- Verbalizes Understanding   Class Start Time 1406   Class Stop Time 1442   Class Time Calculation (min) 36 min    Lisa Ayala Name 11/15/22 1600     Education   Cardiac Education Topics Pritikin   Financial trader   Weekly Topic Fast  Evening Meals   Instruction Review Code 1- Verbalizes Understanding   Class Start Time 1400   Class Stop Time 1445   Class Time Calculation (min) 45 min    [place] Name 11/20/22 1500     Education   Cardiac Education Topics Pritikin   Financial trader   Weekly Topic International Cuisine- Spotlight on the [place]   Instruction Review Code 1- Verbalizes Understanding   Class Start Time 1405   Class Stop Time 1453   Class Time Calculation (min) 48 min    [place] Name 12/04/22 1500     Education   Cardiac Education Topics Pritikin   Select Workshops     Workshops   Educator Exercise Physiologist   Select Exercise   Exercise Workshop Hotel manager and Fall Prevention   Instruction Review Code 1- Verbalizes Understanding   Class Start Time 1350   Class Stop Time 1432   Class Time Calculation (min) 42 min            Core Videos: Exercise    Move It!  Clinical staff conducted group or individual video education with verbal and written material and guidebook.  Patient learns the recommended Pritikin exercise program. Exercise with the goal of living a long, healthy life. Some of the health benefits of exercise include controlled diabetes, healthier blood pressure levels, improved cholesterol levels, improved heart and lung capacity, improved sleep, and better body composition. Everyone should speak with their doctor before starting or changing an exercise routine.  Biomechanical Limitations Clinical staff conducted group or individual video education with verbal and written material and guidebook.  Patient learns how biomechanical limitations Ayala impact exercise and how we Ayala mitigate and possibly overcome limitations to have an impactful and balanced exercise routine.  Body Composition Clinical staff conducted group or individual video education with verbal and written material and guidebook.  Patient learns that body  composition (ratio of muscle mass to fat mass) is a key component to assessing overall  fitness, rather than body weight alone. Increased fat mass, especially visceral belly fat, Ayala put [place] at increased risk for metabolic syndrome, type 2 diabetes, heart disease, and even death. It is recommended to combine diet and exercise (cardiovascular and resistance training) to improve your body composition. Seek guidance from your physician and exercise physiologist before implementing an exercise routine.  Exercise Action Plan Clinical staff conducted group or individual video education with verbal and written material and guidebook.  Patient learns the recommended strategies to achieve and enjoy long-term exercise adherence, including variety, self-motivation, self-efficacy, and positive decision making. Benefits of exercise include fitness, good health, weight management, more energy, better sleep, less stress, and overall well-being.  Medical   Heart Disease Risk Reduction Clinical staff conducted group or individual video education with verbal and written material and guidebook.  Patient learns our heart is our most vital organ as it circulates oxygen, nutrients, white blood cells, and hormones throughout the entire body, and carries waste away. Data supports a plant-based eating plan like the Pritikin Program for its effectiveness in slowing progression of and reversing heart disease. The video provides a number of recommendations to address heart disease.   Metabolic Syndrome and Belly Fat  Clinical staff conducted group or individual video education with verbal and written material and guidebook.  Patient learns what metabolic syndrome is, how it leads to heart disease, and how one Ayala reverse it and keep it from coming back. You have metabolic syndrome if you have 3 of the following 5 criteria: abdominal obesity, high blood pressure, high triglycerides, low HDL cholesterol, and high blood  sugar.  Hypertension and Heart Disease Clinical staff conducted group or individual video education with verbal and written material and guidebook.  Patient learns that high blood pressure, or hypertension, is very common in the [place]. Hypertension is largely due to excessive salt intake, but other important risk factors include being overweight, physical inactivity, drinking too much alcohol, smoking, and not eating enough potassium from fruits and vegetables. High blood pressure is a leading risk factor for heart attack, stroke, congestive heart failure, dementia, kidney failure, and premature death. Long-term effects of excessive salt intake include stiffening of the arteries and thickening of heart muscle and organ damage. Recommendations include ways to reduce hypertension and the risk of heart disease.  Diseases of Our Time - Focusing on Diabetes Clinical staff conducted group or individual video education with verbal and written material and guidebook.  Patient learns why the best way to stop diseases of our time is prevention, through food and other lifestyle changes. Medicine (such as prescription pills and surgeries) is often only a Band-Aid on the problem, not a long-term solution. Most common diseases of our time include obesity, type 2 diabetes, hypertension, heart disease, and cancer. The Pritikin Program is recommended and has been proven to help reduce, reverse, and/or prevent the damaging effects of metabolic syndrome.  Nutrition   Overview of the Pritikin Eating Plan  Clinical staff conducted group or individual video education with verbal and written material and guidebook.  Patient learns about the [place] for disease risk reduction. The [place] emphasizes a wide variety of unrefined, minimally-processed carbohydrates, like fruits, vegetables, whole grains, and legumes. Go, Caution, and Stop food choices are explained. Plant-based and lean animal  proteins are emphasized. Rationale provided for low sodium intake for blood pressure control, low added sugars for blood sugar stabilization, and low added fats and oils for coronary artery disease risk reduction  and weight management.  Calorie Density  Clinical staff conducted group or individual video education with verbal and written material and guidebook.  Patient learns about calorie density and how it impacts the Pritikin Eating Plan. Knowing the characteristics of the food you choose will help you decide whether those foods will lead to weight gain or weight loss, and whether you want to consume more or less of them. Weight loss is usually a side effect of the Pritikin Eating Plan because of its focus on low calorie-dense foods.  Label Reading  Clinical staff conducted group or individual video education with verbal and written material and guidebook.  Patient learns about the Pritikin recommended label reading guidelines and corresponding recommendations regarding calorie density, added sugars, sodium content, and whole grains.  Dining Out - Part 1  Clinical staff conducted group or individual video education with verbal and written material and guidebook.  Patient learns that restaurant meals Ayala be sabotaging because they Ayala be so high in calories, fat, sodium, and/or sugar. Patient learns recommended strategies on how to positively address this and avoid unhealthy pitfalls.  Facts on Fats  Clinical staff conducted group or individual video education with verbal and written material and guidebook.  Patient learns that lifestyle modifications Ayala be just as effective, if not more so, as many medications for lowering your risk of heart disease. A Pritikin lifestyle Ayala help to reduce your risk of inflammation and atherosclerosis (cholesterol build-up, or plaque, in the artery walls). Lifestyle interventions such as dietary choices and physical activity address the cause of atherosclerosis.  A review of the types of fats and their impact on blood cholesterol levels, along with dietary recommendations to reduce fat intake is also included.  Nutrition Action Plan  Clinical staff conducted group or individual video education with verbal and written material and guidebook.  Patient learns how to incorporate Pritikin recommendations into their lifestyle. Recommendations include planning and keeping personal health goals in mind as an important part of their success.  Healthy Mind-Set    Healthy Minds, Bodies, Hearts  Clinical staff conducted group or individual video education with verbal and written material and guidebook.  Patient learns how to identify when they are stressed. Video will discuss the impact of that stress, as well as the many benefits of stress management. Patient will also be introduced to stress management techniques. The way we think, act, and feel has an impact on our hearts.  How Our Thoughts Ayala Heal Our Hearts  Clinical staff conducted group or individual video education with verbal and written material and guidebook.  Patient learns that negative thoughts Ayala cause depression and anxiety. This Ayala result in negative lifestyle behavior and serious health problems. Cognitive behavioral therapy is an effective method to help control our thoughts in order to change and improve our emotional outlook.  Additional Videos:  Exercise    Improving Performance  Clinical staff conducted group or individual video education with verbal and written material and guidebook.  Patient learns to use a non-linear approach by alternating intensity levels and lengths of time spent exercising to help burn more calories and lose more body fat. Cardiovascular exercise helps improve heart health, metabolism, hormonal balance, blood sugar control, and recovery from fatigue. Resistance training improves strength, endurance, balance, coordination, reaction time, metabolism, and muscle mass.  Flexibility exercise improves circulation, posture, and balance. Seek guidance from your physician and exercise physiologist before implementing an exercise routine and learn your capabilities and proper form for all exercise.  Introduction to Yoga  Clinical staff conducted group or individual video education with verbal and written material and guidebook.  Patient learns about yoga, a discipline of the coming together of mind, breath, and body. The benefits of yoga include improved flexibility, improved range of motion, better posture and core strength, increased lung function, weight loss, and positive self-image. Yoga's heart health benefits include lowered blood pressure, healthier heart rate, decreased cholesterol and triglyceride levels, improved immune function, and reduced stress. Seek guidance from your physician and exercise physiologist before implementing an exercise routine and learn your capabilities and proper form for all exercise.  Medical   Aging: Enhancing Your Quality of Life  Clinical staff conducted group or individual video education with verbal and written material and guidebook.  Patient learns key strategies and recommendations to stay in good physical health and enhance quality of life, such as prevention strategies, having an advocate, securing a [place], and keeping a list of medications and system for tracking them. It also discusses how to avoid risk for bone loss.  Biology of Weight Control  Clinical staff conducted group or individual video education with verbal and written material and guidebook.  Patient learns that weight gain occurs because we consume more calories than we burn (eating more, moving less). Even if your body weight is normal, you may have higher ratios of fat compared to muscle mass. Too much body fat puts you at increased risk for cardiovascular disease, heart attack, stroke, type 2 diabetes, and obesity-related  cancers. In addition to exercise, following the [place] Ayala help reduce your risk.  Decoding Lab Results  Clinical staff conducted group or individual video education with verbal and written material and guidebook.  Patient learns that lab test reflects one measurement whose values change over time and are influenced by many factors, including medication, stress, sleep, exercise, food, hydration, pre-existing medical conditions, and more. It is recommended to use the knowledge from this video to become more involved with your lab results and evaluate your numbers to speak with your doctor.   Diseases of Our Time - Overview  Clinical staff conducted group or individual video education with verbal and written material and guidebook.  Patient learns that according to the CDC, 50% to 70% of chronic diseases (such as obesity, type 2 diabetes, elevated lipids, hypertension, and heart disease) are avoidable through lifestyle improvements including healthier food choices, listening to satiety cues, and increased physical activity.  Sleep Disorders Clinical staff conducted group or individual video education with verbal and written material and guidebook.  Patient learns how good quality and duration of sleep are important to overall health and well-being. Patient also learns about sleep disorders and how they impact health along with recommendations to address them, including discussing with a physician.  Nutrition  Dining Out - Part 2 Clinical staff conducted group or individual video education with verbal and written material and guidebook.  Patient learns how to plan ahead and communicate in order to maximize their dining experience in a healthy and nutritious manner. Included are recommended food choices based on the type of restaurant the patient is visiting.   Fueling a Best boy conducted group or individual video education with verbal and written material and  guidebook.  There is a strong connection between our food choices and our health. Diseases like obesity and type 2 diabetes are very prevalent and are in large-part due to lifestyle choices. The [place]  provides plenty of food and hunger-curbing satisfaction. It is easy to follow, affordable, and helps reduce health risks.  Menu Workshop  Clinical staff conducted group or individual video education with verbal and written material and guidebook.  Patient learns that restaurant meals Ayala sabotage health goals because they are often packed with calories, fat, sodium, and sugar. Recommendations include strategies to plan ahead and to communicate with the manager, chef, or server to help order a healthier meal.  Planning Your Eating Strategy  Clinical staff conducted group or individual video education with verbal and written material and guidebook.  Patient learns about the [place] and its benefit of reducing the risk of disease. The [place] does not focus on calories. Instead, it emphasizes high-quality, nutrient-rich foods. By knowing the characteristics of the foods, we choose, we Ayala determine their calorie density and make informed decisions.  Targeting Your Nutrition Priorities  Clinical staff conducted group or individual video education with verbal and written material and guidebook.  Patient learns that lifestyle habits have a tremendous impact on disease risk and progression. This video provides eating and physical activity recommendations based on your personal health goals, such as reducing LDL cholesterol, losing weight, preventing or controlling type 2 diabetes, and reducing high blood pressure.  Vitamins and Minerals  Clinical staff conducted group or individual video education with verbal and written material and guidebook.  Patient learns different ways to obtain key vitamins and minerals, including through a recommended healthy diet. It is  important to discuss all supplements you take with your doctor.   Healthy Mind-Set    Smoking Cessation  Clinical staff conducted group or individual video education with verbal and written material and guidebook.  Patient learns that cigarette smoking and tobacco addiction pose a serious health risk which affects millions of people. Stopping smoking will significantly reduce the risk of heart disease, lung disease, and many forms of cancer. Recommended strategies for quitting are covered, including working with your doctor to develop a successful plan.  Culinary   Becoming a Financial trader conducted group or individual video education with verbal and written material and guidebook.  Patient learns that cooking at home Ayala be healthy, cost-effective, quick, and puts them in control. Keys to cooking healthy recipes will include looking at your recipe, assessing your equipment needs, planning ahead, making it simple, choosing cost-effective seasonal ingredients, and limiting the use of added fats, salts, and sugars.  Cooking - Breakfast and Snacks  Clinical staff conducted group or individual video education with verbal and written material and guidebook.  Patient learns how important breakfast is to satiety and nutrition through the entire day. Recommendations include key foods to eat during breakfast to help stabilize blood sugar levels and to prevent overeating at meals later in the day. Planning ahead is also a key component.  Cooking - Human resources officer conducted group or individual video education with verbal and written material and guidebook.  Patient learns eating strategies to improve overall health, including an approach to cook more at home. Recommendations include thinking of animal protein as a side on your plate rather than center stage and focusing instead on lower calorie dense options like vegetables, fruits, whole grains, and plant-based proteins,  such as beans. Making sauces in large quantities to freeze for later and leaving the skin on your vegetables are also recommended to maximize your experience.  Cooking - Healthy Salads and Dressing Clinical staff conducted group or individual  video education with verbal and written material and guidebook.  Patient learns that vegetables, fruits, whole grains, and legumes are the foundations of the [place]. Recommendations include how to incorporate each of these in flavorful and healthy salads, and how to create homemade salad dressings. Proper handling of ingredients is also covered. Cooking - Soups and [employer] - Soups and Desserts Clinical staff conducted group or individual video education with verbal and written material and guidebook.  Patient learns that Pritikin soups and desserts make for easy, nutritious, and delicious snacks and meal components that are low in sodium, fat, sugar, and calorie density, while high in vitamins, minerals, and filling fiber. Recommendations include simple and healthy ideas for soups and desserts.   Overview     The Pritikin Solution Program Overview Clinical staff conducted group or individual video education with verbal and written material and guidebook.  Patient learns that the results of the [place] Program have been documented in more than 100 articles published in peer-reviewed journals, and the benefits include reducing risk factors for (and, in some cases, even reversing) high cholesterol, high blood pressure, type 2 diabetes, obesity, and more! An overview of the three key pillars of the Pritikin Program will be covered: eating well, doing regular exercise, and having a healthy mind-set.  WORKSHOPS  Exercise: Exercise Basics: Building Your Action Plan Clinical staff led group instruction and group discussion with PowerPoint presentation and patient guidebook. To enhance the learning environment the use of posters, models and  videos may be added. At the conclusion of this workshop, patients will comprehend the difference between physical activity and exercise, as well as the benefits of incorporating both, into their routine. Patients will understand the FITT (Frequency, Intensity, Time, and Type) principle and how to use it to build an exercise action plan. In addition, safety concerns and other considerations for exercise and cardiac rehab will be addressed by the presenter. The purpose of this lesson is to promote a comprehensive and effective weekly exercise routine in order to improve patients' overall level of fitness.   Managing Heart Disease: Your Path to a Healthier Heart Clinical staff led group instruction and group discussion with PowerPoint presentation and patient guidebook. To enhance the learning environment the use of posters, models and videos may be added.At the conclusion of this workshop, patients will understand the anatomy and physiology of the heart. Additionally, they will understand how Pritikin's three pillars impact the risk factors, the progression, and the management of heart disease.  The purpose of this lesson is to provide a high-level overview of the heart, heart disease, and how the Pritikin lifestyle positively impacts risk factors.  Exercise Biomechanics Clinical staff led group instruction and group discussion with PowerPoint presentation and patient guidebook. To enhance the learning environment the use of posters, models and videos may be added. Patients will learn how the structural parts of their bodies function and how these functions impact their daily activities, movement, and exercise. Patients will learn how to promote a neutral spine, learn how to manage pain, and identify ways to improve their physical movement in order to promote healthy living. The purpose of this lesson is to expose patients to common physical limitations that impact physical activity. Participants  will learn practical ways to adapt and manage aches and pains, and to minimize their effect on regular exercise. Patients will learn how to maintain good posture while sitting, walking, and lifting.  Balance Training and Fall Prevention  Clinical staff led group  instruction and group discussion with PowerPoint presentation and patient guidebook. To enhance the learning environment the use of posters, models and videos may be added. At the conclusion of this workshop, patients will understand the importance of their sensorimotor skills (vision, proprioception, and the vestibular system) in maintaining their ability to balance as they age. Patients will apply a variety of balancing exercises that are appropriate for their current level of function. Patients will understand the common causes for poor balance, possible solutions to these problems, and ways to modify their physical environment in order to minimize their fall risk. The purpose of this lesson is to teach patients about the importance of maintaining balance as they age and ways to minimize their risk of falling.  WORKSHOPS   Nutrition:  Fueling a Scientist, research (physical sciences) led group instruction and group discussion with PowerPoint presentation and patient guidebook. To enhance the learning environment the use of posters, models and videos may be added. Patients will review the foundational principles of the [place] and understand what constitutes a serving size in each of the food groups. Patients will also learn Pritikin-friendly foods that are better choices when away from home and review make-ahead meal and snack options. Calorie density will be reviewed and applied to three nutrition priorities: weight maintenance, weight loss, and weight gain. The purpose of this lesson is to reinforce (in a group setting) the key concepts around what patients are recommended to eat and how to apply these guidelines when away from home by  planning and selecting Pritikin-friendly options. Patients will understand how calorie density may be adjusted for different weight management goals.  Mindful Eating  Clinical staff led group instruction and group discussion with PowerPoint presentation and patient guidebook. To enhance the learning environment the use of posters, models and videos may be added. Patients will briefly review the concepts of the [place] and the importance of low-calorie dense foods. The concept of mindful eating will be introduced as well as the importance of paying attention to internal hunger signals. Triggers for non-hunger eating and techniques for dealing with triggers will be explored. The purpose of this lesson is to provide patients with the opportunity to review the basic principles of the [place], discuss the value of eating mindfully and how to measure internal cues of hunger and fullness using the Hunger Scale. Patients will also discuss reasons for non-hunger eating and learn strategies to use for controlling emotional eating.  Targeting Your Nutrition Priorities Clinical staff led group instruction and group discussion with PowerPoint presentation and patient guidebook. To enhance the learning environment the use of posters, models and videos may be added. Patients will learn how to determine their genetic susceptibility to disease by reviewing their family history. Patients will gain insight into the importance of diet as part of an overall healthy lifestyle in mitigating the impact of genetics and other environmental insults. The purpose of this lesson is to provide patients with the opportunity to assess their personal nutrition priorities by looking at their family history, their own health history and current risk factors. Patients will also be able to discuss ways of prioritizing and modifying the [place] for their highest risk areas  Menu  Clinical staff led group  instruction and group discussion with PowerPoint presentation and patient guidebook. To enhance the learning environment the use of posters, models and videos may be added. Using menus brought in from [employer], or printed from [employer], patients will  apply the Pritikin dining out guidelines that were presented in the [employer] video. Patients will also be able to practice these guidelines in a variety of provided scenarios. The purpose of this lesson is to provide patients with the opportunity to practice hands-on learning of the [place] with actual menus and practice scenarios.  Label Reading Clinical staff led group instruction and group discussion with PowerPoint presentation and patient guidebook. To enhance the learning environment the use of posters, models and videos may be added. Patients will review and discuss the Pritikin label reading guidelines presented in Pritikin's Label Reading Educational series video. Using fool labels brought in from local grocery stores and markets, patients will apply the label reading guidelines and determine if the packaged food meet the Pritikin guidelines. The purpose of this lesson is to provide patients with the opportunity to review, discuss, and practice hands-on learning of the Pritikin Label Reading guidelines with actual packaged food labels. [place] Workshops are designed to teach patients ways to prepare quick, simple, and affordable recipes at home. The importance of nutrition's role in chronic disease risk reduction is reflected in its emphasis in the overall Pritikin program. By learning how to prepare essential core Pritikin Eating Plan recipes, patients will increase control over what they eat; be able to customize the flavor of foods without the use of added salt, sugar, or fat; and improve the quality of the food they consume. By learning a set of core recipes  which are easily assembled, quickly prepared, and affordable, patients are more likely to prepare more healthy foods at home. These workshops focus on convenient breakfasts, simple entres, side dishes, and desserts which Ayala be prepared with minimal effort and are consistent with nutrition recommendations for cardiovascular risk reduction. Cooking [employer] are taught by a Engineer, materials (RD) who has been trained by the [employer]. The chef or RD has a clear understanding of the importance of minimizing - if not completely eliminating - added fat, sugar, and sodium in recipes. Throughout the series of [place] Workshop sessions, patients will learn about healthy ingredients and efficient methods of cooking to build confidence in their capability to prepare    Cooking School weekly topics:  Adding Flavor- Sodium-Free  Fast and Healthy Breakfasts  Powerhouse Plant-Based Proteins  Satisfying Salads and Dressings  Simple Sides and Sauces  International Cuisine-Spotlight on the [place] Zones  Delicious Desserts  Savory Soups  [employer] - Meals in a Agricultural consultant Appetizers and Snacks  Comforting Weekend Breakfasts  One-Pot Wonders   Fast Evening Meals  Contractor Your Pritikin Plate  WORKSHOPS   Healthy Mindset (Psychosocial):  Focused Goals, Sustainable Changes Clinical staff led group instruction and group discussion with PowerPoint presentation and patient guidebook. To enhance the learning environment the use of posters, models and videos may be added. Patients will be able to apply effective goal setting strategies to establish at least one personal goal, and then take consistent, meaningful action toward that goal. They will learn to identify common barriers to achieving personal goals and develop strategies to overcome them. Patients will also gain an understanding of how our mind-set Ayala impact our ability to achieve  goals and the importance of cultivating a positive and growth-oriented mind-set. The purpose of this lesson is to provide patients with a deeper understanding of how to set and achieve personal goals, as well as the tools and  strategies needed to overcome common obstacles which may arise along the way.  From Head to Heart: The Power of a Healthy Outlook  Clinical staff led group instruction and group discussion with PowerPoint presentation and patient guidebook. To enhance the learning environment the use of posters, models and videos may be added. Patients will be able to recognize and describe the impact of emotions and mood on physical health. They will discover the importance of self-care and explore self-care practices which may work for them. Patients will also learn how to utilize the 4 C's to cultivate a healthier outlook and better manage stress and challenges. The purpose of this lesson is to demonstrate to patients how a healthy outlook is an essential part of maintaining good health, especially as they continue their cardiac rehab journey.  Healthy Sleep for a Healthy Heart Clinical staff led group instruction and group discussion with PowerPoint presentation and patient guidebook. To enhance the learning environment the use of posters, models and videos may be added. At the conclusion of this workshop, patients will be able to demonstrate knowledge of the importance of sleep to overall health, well-being, and quality of life. They will understand the symptoms of, and treatments for, common sleep disorders. Patients will also be able to identify daytime and nighttime behaviors which impact sleep, and they will be able to apply these tools to help manage sleep-related challenges. The purpose of this lesson is to provide patients with a general overview of sleep and outline the importance of quality sleep. Patients will learn about a few of the most common sleep disorders. Patients will also be  introduced to the concept of "sleep hygiene," and discover ways to self-manage certain sleeping problems through simple daily behavior changes. Finally, the workshop will motivate patients by clarifying the links between quality sleep and their goals of heart-healthy living.   Recognizing and Reducing Stress Clinical staff led group instruction and group discussion with PowerPoint presentation and patient guidebook. To enhance the learning environment the use of posters, models and videos may be added. At the conclusion of this workshop, patients will be able to understand the types of stress reactions, differentiate between acute and chronic stress, and recognize the impact that chronic stress has on their health. They will also be able to apply different coping mechanisms, such as reframing negative self-talk. Patients will have the opportunity to practice a variety of stress management techniques, such as deep abdominal breathing, progressive muscle relaxation, and/or guided imagery.  The purpose of this lesson is to educate patients on the role of stress in their lives and to provide healthy techniques for coping with it.  Learning Barriers/Preferences:  Learning Barriers/Preferences - 11/01/22 1314       Learning Barriers/Preferences   Learning Barriers None    Learning Preferences Computer/Internet;Audio;Group Instruction;Individual Instruction;Pictoral;Skilled Demonstration;Video;Written Material;Verbal Instruction             Education Topics:  Knowledge Questionnaire Score:  Knowledge Questionnaire Score - 11/01/22 1314       Knowledge Questionnaire Score   Pre Score 19/24             Core Components/Risk Factors/Patient Goals at Admission:  Personal Goals and Risk Factors at Admission - 11/01/22 1315       Core Components/Risk Factors/Patient Goals on Admission    Weight Management Yes;Weight Loss    Intervention Weight Management: Develop a combined nutrition and  exercise program designed to reach desired caloric intake, while maintaining appropriate intake of nutrient and fiber,  sodium and fats, and appropriate energy expenditure required for the weight goal.;Weight Management: Provide education and appropriate resources to help participant work on and attain dietary goals.    Admit Weight 173 lb 8 oz (78.7 kg)    Goal Weight: Short Term 155 lb (70.3 kg)    Expected Outcomes Short Term: Continue to assess and modify interventions until short term weight is achieved;Long Term: Adherence to nutrition and physical activity/exercise program aimed toward attainment of established weight goal;Weight Loss: Understanding of general recommendations for a balanced deficit meal plan, which promotes 1-2 lb weight loss per week and includes a negative energy balance of [phone] kcal/d;Understanding of distribution of calorie intake throughout the day with the consumption of 4-5 meals/snacks;Understanding recommendations for meals to include 15-35% energy as protein, 25-35% energy from fat, 35-60% energy from carbohydrates, less than 200mg  of dietary cholesterol, 20-35 gm of total fiber daily    Heart Failure Yes    Intervention Provide a combined exercise and nutrition program that is supplemented with education, support and counseling about heart failure. Directed toward relieving symptoms such as shortness of breath, decreased exercise tolerance, and extremity edema.    Expected Outcomes Improve functional capacity of life;Short term: Attendance in program 2-3 days a week with increased exercise capacity. Reported lower sodium intake. Reported increased fruit and vegetable intake. Reports medication compliance.;Short term: Daily weights obtained and reported for increase. Utilizing diuretic protocols set by physician.;Long term: Adoption of self-care skills and reduction of barriers for early signs and symptoms recognition and intervention leading to self-care maintenance.     Hypertension Yes    Intervention Provide education on lifestyle modifcations including regular physical activity/exercise, weight management, moderate sodium restriction and increased consumption of fresh fruit, vegetables, and low fat dairy, alcohol moderation, and smoking cessation.;Monitor prescription use compliance.    Expected Outcomes Short Term: Continued assessment and intervention until BP is < 140/25mm HG in hypertensive participants. < 130/44mm HG in hypertensive participants with diabetes, heart failure or chronic kidney disease.;Long Term: Maintenance of blood pressure at goal levels.    Stress Yes    Intervention Offer individual and/or small group education and counseling on adjustment to heart disease, stress management and health-related lifestyle change. Teach and support self-help strategies.;Refer participants experiencing significant psychosocial distress to appropriate mental health specialists for further evaluation and treatment. When possible, include family members and significant others in education/counseling sessions.    Expected Outcomes Short Term: Participant demonstrates changes in health-related behavior, relaxation and other stress management skills, ability to obtain effective social support, and compliance with psychotropic medications if prescribed.;Long Term: Emotional wellbeing is indicated by absence of clinically significant psychosocial distress or social isolation.             Core Components/Risk Factors/Patient Goals Review:   Goals and Risk Factor Review     Row Name 11/14/22 [id number] 11/21/22 0912 12/13/22 1729         Core Components/Risk Factors/Patient Goals Review   Personal Goals Review Weight Management/Obesity;Hypertension;Lipids;Stress Weight Management/Obesity;Hypertension;Lipids;Stress Weight Management/Obesity;Hypertension;Lipids;Stress     Review Jacaria started intensive cardiac rehab on 11/13/22. Khushi did well with exercise for her  fitness level as Lisa Ayala is somewhat deconditoned. Diastolic blood pressure in the 90's . Forwarded to heart failure clinic. Having difficulty with affording medications Allysen started intensive cardiac rehab on 11/13/22. Ayza is off to a good start to exercise. Blood pressures have improved. Tyshauna is enjoying participating in group exercise. Abcde is doing well with exercise at  intensive cardiac rehab  Breosha is enjoying participating in group exercise. Vital signs have been stable     Expected Outcomes Tennile will continue to participate in intensive cardiac rehab for exercise, nutrition and lifestyle modifications Lisa Ayala will continue to participate in intensive cardiac rehab for exercise, nutrition and lifestyle modifications Lisa Ayala will continue to participate in intensive cardiac rehab for exercise, nutrition and lifestyle modifications              Core Components/Risk Factors/Patient Goals at Discharge (Final Review):   Goals and Risk Factor Review - 12/13/22 1729       Core Components/Risk Factors/Patient Goals Review   Personal Goals Review Weight Management/Obesity;Hypertension;Lipids;Stress    Review Shaunise is doing well with exercise at  intensive cardiac rehab  Anji is enjoying participating in group exercise. Vital signs have been stable    Expected Outcomes Jalon will continue to participate in intensive cardiac rehab for exercise, nutrition and lifestyle modifications             ITP Comments:  ITP Comments     Row Name 11/01/22 1048 11/14/22 0743 11/21/22 0857 12/13/22 1725     ITP Comments Dr. Fransico Him medical director. Introduction to pritikin education/ intensive cardiac rehab. Initial orientation packet reviewed with patient. 30 Day ITP Review. Annessa started intensive cardiac rehab on 11/13/22. Mar did well with exercise for her fitness level 30 Day ITP Review. Akima started intensive cardiac rehab on 11/13/22. Kiernan 30 Day ITP Review. Ravensymone has good participation  when in attendance at  intensive cardiac rehab             Comments: See ITP comments.[name] RN BSN

## 2022-12-15 ENCOUNTER — Ambulatory Visit: Payer: Medicaid Other | Admitting: Pharmacist

## 2022-12-15 ENCOUNTER — Encounter (HOSPITAL_COMMUNITY): Payer: Medicaid Other

## 2022-12-18 ENCOUNTER — Encounter (HOSPITAL_COMMUNITY): Payer: Medicaid Other

## 2022-12-18 ENCOUNTER — Telehealth (HOSPITAL_COMMUNITY): Payer: Self-pay | Admitting: *Deleted

## 2022-12-18 NOTE — Telephone Encounter (Signed)
Patient left message on department voicemail that she will be absent from cardiac rehab today and not sure when she'll return, but she will call back.

## 2022-12-19 ENCOUNTER — Ambulatory Visit: Payer: Medicaid Other | Attending: Critical Care Medicine | Admitting: Pharmacist

## 2022-12-19 DIAGNOSIS — Z9889 Other specified postprocedural states: Secondary | ICD-10-CM

## 2022-12-19 LAB — POCT INR: POC INR: 2.9

## 2022-12-20 ENCOUNTER — Encounter (HOSPITAL_COMMUNITY): Payer: Medicaid Other

## 2022-12-22 ENCOUNTER — Encounter (HOSPITAL_COMMUNITY): Payer: Medicaid Other

## 2022-12-25 ENCOUNTER — Encounter (HOSPITAL_COMMUNITY)
Admission: RE | Admit: 2022-12-25 | Discharge: 2022-12-25 | Disposition: A | Discharge status: Home / Self Care | Payer: Medicaid Other | Source: Ambulatory Visit | Attending: Internal Medicine | Admitting: Internal Medicine

## 2022-12-25 DIAGNOSIS — Z952 Presence of prosthetic heart valve: Secondary | ICD-10-CM | POA: Insufficient documentation

## 2022-12-25 DIAGNOSIS — Z9889 Other specified postprocedural states: Secondary | ICD-10-CM | POA: Insufficient documentation

## 2022-12-27 ENCOUNTER — Encounter (HOSPITAL_COMMUNITY): Payer: Medicaid Other

## 2022-12-29 ENCOUNTER — Encounter (HOSPITAL_COMMUNITY): Payer: Medicaid Other

## 2023-01-01 ENCOUNTER — Encounter (HOSPITAL_COMMUNITY): Payer: Medicaid Other

## 2023-01-03 ENCOUNTER — Encounter (HOSPITAL_COMMUNITY): Payer: Medicaid Other

## 2023-01-03 ENCOUNTER — Telehealth (HOSPITAL_COMMUNITY): Payer: Self-pay | Admitting: *Deleted

## 2023-01-03 NOTE — Telephone Encounter (Signed)
Left message to call cardiac rehab.Pablo Stauffer Walden Atavia Poppe RN BSN  

## 2023-01-05 ENCOUNTER — Encounter (HOSPITAL_COMMUNITY): Payer: Medicaid Other

## 2023-01-05 ENCOUNTER — Encounter (HOSPITAL_COMMUNITY): Payer: Self-pay | Admitting: *Deleted

## 2023-01-05 DIAGNOSIS — Z9889 Other specified postprocedural states: Secondary | ICD-10-CM

## 2023-01-05 DIAGNOSIS — Z952 Presence of prosthetic heart valve: Secondary | ICD-10-CM

## 2023-01-05 NOTE — Progress Notes (Signed)
Discharge Progress Report  Patient Details  Name: Lisa Ayala MRN: [id number] Date of Birth: 23-Jul-1966 Referring Provider:   Flowsheet Row CARDIAC REHAB PHASE II ORIENTATION from 11/01/2022 in [facility]  Referring Provider Arvilla Meres, MD        Number of Visits: 12  Reason for Discharge:  Patient reached a stable level of exercise. Patient independent in their exercise. Patient has met program and personal goals.  Smoking History:  Social History   Tobacco Use  Smoking Status Never  Smokeless Tobacco Never    Diagnosis:  S/P mitral valve replacement  S/P tricuspid valve repair  ADL [facility]:   Initial Exercise Prescription:   Discharge Exercise Prescription (Final Exercise Prescription Changes):  Exercise Prescription Changes - 11/13/22 1500       Response to Exercise   Blood Pressure (Admit) 152/90    Blood Pressure (Exercise) 158/90    Blood Pressure (Exit) 140/90    Heart Rate (Admit) 85 bpm    Heart Rate (Exercise) 109 bpm    Rating of Perceived Exertion (Exercise) 11    Symptoms None    Comments Pt's first day in the CRP2 program    Duration Continue with 30 min of aerobic exercise without signs/symptoms of physical distress.    Intensity THRR unchanged      Progression   Progression Continue to progress workloads to maintain intensity without signs/symptoms of physical distress.    Average METs 2.3      Resistance Training   Training Prescription Yes    Weight 2 lbs    Reps 10-15    Time 10 Minutes      Interval Training   Interval Training No      NuStep   Level 2    SPM 65    Minutes 15    METs 1.9      Track   Laps 14    Minutes 15    METs 2.79             Functional Capacity:   Psychological, QOL, Others - Outcomes: PHQ 2/9:    11/08/2022    2:06 PM 11/01/2022    1:01 PM 08/09/2022   11:12 AM 06/07/2022    9:21 AM 04/11/2022   11:26 AM  Depression  screen PHQ 2/9  Decreased Interest 0 0 0 0 0  Down, Depressed, Hopeless 0 1 0 0 0  PHQ - 2 Score 0 1 0 0 0  Altered sleeping 0 0  0   Tired, decreased energy 1 1  0   Change in appetite 1 0  0   Feeling bad or failure about yourself  0 1  0   Trouble concentrating 0 0  0   Moving slowly or fidgety/restless 0 0  0   Suicidal thoughts 0 0  0   PHQ-9 Score 2 3  0   Difficult doing work/chores  Somewhat difficult  Not difficult at all     Quality of Life:   Personal Goals: Goals established at orientation with interventions provided to work toward goal.    Personal Goals Discharge:  Goals and Risk Factor Review     Row Name 11/14/22 0814 11/21/22 0912 12/13/22 1729         Core Components/Risk Factors/Patient Goals Review   Personal Goals Review Weight Management/Obesity;Hypertension;Lipids;Stress Weight Management/Obesity;Hypertension;Lipids;Stress Weight Management/Obesity;Hypertension;Lipids;Stress     Review Lisa Ayala started intensive cardiac rehab on 11/13/22. Lisa Ayala did  well with exercise for her fitness level as Lisa Ayala is somewhat deconditoned. Diastolic blood pressure in the 90's . Forwarded to heart failure clinic. Having difficulty with affording medications Lisa Ayala started intensive cardiac rehab on 11/13/22. Lisa Ayala is off to a good start to exercise. Blood pressures have improved. Lisa Ayala is enjoying participating in group exercise. Lisa Ayala is doing well with exercise at  intensive cardiac rehab  Lisa Ayala is enjoying participating in group exercise. Vital signs have been stable     Expected Outcomes Lisa Ayala will continue to participate in intensive cardiac rehab for exercise, nutrition and lifestyle modifications Lisa Ayala will continue to participate in intensive cardiac rehab for exercise, nutrition and lifestyle modifications Lisa Ayala will continue to participate in intensive cardiac rehab for exercise, nutrition and lifestyle modifications              Exercise Goals and  Review:   Exercise Goals Re-Evaluation:  Exercise Goals Re-Evaluation     Row Name 11/13/22 1536             Exercise Goal Re-Evaluation   Exercise Goals Review Increase Physical Activity;Increase Strength and Stamina;Able to understand and use rate of perceived exertion (RPE) scale;Knowledge and understanding of Target Heart Rate Range (THRR);Understanding of Exercise Prescription       Comments Pt's first day in the CRP2 program. Pt undersntads the exercise Rx, RPE sclae and THRR.       Expected Outcomes Will continue to monitor patient and progress exercise workloads as tolerated.                Nutrition & Weight - Outcomes:    Nutrition:  Nutrition Therapy & Goals - 12/11/22 1441       Nutrition Therapy   Diet Heart Healthy Diet    Drug/Food Interactions Coumadin/Vit K      Personal Nutrition Goals   Nutrition Goal Patient to identify strategies for reducing cardiovascular risk by attending the weekly Pritikin education and nutrition series    Personal Goal #2 Patient to improve diet quality by using the plate method as a daily guide for meal planning to include lean protein/plant protein, fruits, vegetables, whole grains, nonfat dairy as part of well balanced diet    Personal Goal #3 Patient to identify strategies for weight loss of 0.5-2.0# per week.    Personal Goal #4 Patient to reduce sodium to 1500mg  per day.    Comments Lisa Ayala is in the contemplation stage of change. Her attendance to intensive cardiac rehab remains variable, and she attended <4 Pritikin education sessions. She will benefit from participation in intensive cardiac rehab for nutrition, exercise, and lifestyle modification and adherance to the Pritikin eating plan.      Intervention Plan   Intervention Prescribe, educate and counsel regarding individualized specific dietary modifications aiming towards targeted core components such as weight, hypertension, lipid management, diabetes, heart failure  and other comorbidities.;Nutrition handout(s) given to patient.    Expected Outcomes Short Term Goal: Understand basic principles of dietary content, such as calories, fat, sodium, cholesterol and nutrients.;Long Term Goal: Adherence to prescribed nutrition plan.             Nutrition Discharge:   Education Questionnaire Score:   Lisa Ayala attended 12 exercise and education classes between 11/01/22  through 12/06/22. Lisa Ayala did well with exercise while in attendance. Lisa Ayala's did not return to exercise after her daughter gave birth.  Lisa Ayala's attendance was sporadic when in attendance. Lisa Ayala did enjoy participating in the program when in attendance.Thayer Headings RN  BSN

## 2023-01-08 ENCOUNTER — Encounter (HOSPITAL_COMMUNITY): Payer: Medicaid Other

## 2023-01-09 ENCOUNTER — Other Ambulatory Visit (HOSPITAL_COMMUNITY): Payer: Self-pay

## 2023-01-10 ENCOUNTER — Encounter (HOSPITAL_COMMUNITY): Payer: Medicaid Other

## 2023-01-12 ENCOUNTER — Encounter (HOSPITAL_COMMUNITY): Payer: Medicaid Other

## 2023-01-23 ENCOUNTER — Ambulatory Visit: Payer: Medicaid Other | Attending: Critical Care Medicine | Admitting: Pharmacist

## 2023-01-23 DIAGNOSIS — Z9889 Other specified postprocedural states: Secondary | ICD-10-CM | POA: Diagnosis not present

## 2023-01-23 LAB — POCT INR: POC INR: 2.6

## 2023-01-23 MED ORDER — WARFARIN SODIUM 4 MG PO TABS: 4.0000 mg | ORAL_TABLET | Freq: Every day | ORAL | 2 refills | Status: DC

## 2023-02-08 ENCOUNTER — Other Ambulatory Visit (HOSPITAL_COMMUNITY): Payer: Self-pay

## 2023-02-08 ENCOUNTER — Telehealth (HOSPITAL_COMMUNITY): Payer: Self-pay

## 2023-02-08 NOTE — Telephone Encounter (Signed)
Advanced Heart Failure Patient Advocate Encounter  Received renewal notifications from Eliquis ([employer]) and Entresto [employer]). Review of patient chart shows that she is currently covered by Medicaid. Test claims return $4 copays, and prior authorizations are approved and active.  Renewal forms not submitted at this time, as these medications are affordable to patient.  Burnell Blanks, CPhT Rx Patient Advocate Phone: [phone]

## 2023-02-22 ENCOUNTER — Ambulatory Visit: Payer: Self-pay | Admitting: Pharmacist

## 2023-03-01 ENCOUNTER — Ambulatory Visit: Payer: Medicaid Other | Attending: Critical Care Medicine | Admitting: Pharmacist

## 2023-03-01 DIAGNOSIS — Z9889 Other specified postprocedural states: Secondary | ICD-10-CM | POA: Diagnosis not present

## 2023-03-01 LAB — POCT INR: POC INR: 2.2

## 2023-03-20 ENCOUNTER — Ambulatory Visit: Payer: Medicaid Other | Attending: Family Medicine | Admitting: Pharmacist

## 2023-03-20 DIAGNOSIS — Z9889 Other specified postprocedural states: Secondary | ICD-10-CM | POA: Diagnosis not present

## 2023-03-20 LAB — POCT INR: POC INR: 3.6

## 2023-03-20 NOTE — Progress Notes (Signed)
    Pharmacy Anticoagulation Clinic  Subjective: Patient presents today for INR monitoring. Anticoagulation indication is S/P MVR.   Current dose of warfarin: 1/2 tab (2 mg) on Tuesdays; 1 tab (4 mg) all other days   Adherence to warfarin: Reports no missed doses Signs/symptoms of bleeding: No s/sx of bleeding. Recent changes in diet: Patient reports eating 2 cups of spinach per week (usually on Thursday and Friday) but has not had any cups this week. Recent changes in medications: No changes in meds Upcoming procedures that may impact anticoagulation: No upcoming procedures   Objective: Today's INR = 3.6  Lab Results  Component Value Date   INR 2.2 03/01/2023   INR 2.6 01/23/2023   INR 2.9 12/19/2022     Assessment and Plan: Anticoagulation: Patient is Supratherapeutic based on patient's INR of 3.6 and patient's INR goal of 2.5-3.5. Will  continue  current warfarin dose: 1/2 tab (2 mg) on Tuesdays; 1 tab (4 mg) all other days. Informed patient to eat a serving of spinach tonight.  Patient verbalized understanding and was provided with written instructions. Next INR check planned for 04/10/2023.

## 2023-03-27 ENCOUNTER — Ambulatory Visit: Payer: Medicaid Other | Attending: Critical Care Medicine | Admitting: Critical Care Medicine

## 2023-03-27 ENCOUNTER — Encounter: Payer: Self-pay | Admitting: Critical Care Medicine

## 2023-03-27 VITALS — BP 144/91 | HR 88 | Wt 200.0 lb

## 2023-03-27 DIAGNOSIS — Z1211 Encounter for screening for malignant neoplasm of colon: Secondary | ICD-10-CM | POA: Diagnosis not present

## 2023-03-27 DIAGNOSIS — M25511 Pain in right shoulder: Secondary | ICD-10-CM

## 2023-03-27 DIAGNOSIS — Z9889 Other specified postprocedural states: Secondary | ICD-10-CM | POA: Diagnosis not present

## 2023-03-27 DIAGNOSIS — Z1231 Encounter for screening mammogram for malignant neoplasm of breast: Secondary | ICD-10-CM

## 2023-03-27 DIAGNOSIS — G8929 Other chronic pain: Secondary | ICD-10-CM

## 2023-03-27 DIAGNOSIS — I5032 Chronic diastolic (congestive) heart failure: Secondary | ICD-10-CM

## 2023-03-27 DIAGNOSIS — Z7901 Long term (current) use of anticoagulants: Secondary | ICD-10-CM

## 2023-03-27 MED ORDER — DAPAGLIFLOZIN PROPANEDIOL 10 MG PO TABS: 10.0000 mg | ORAL_TABLET | Freq: Every day | ORAL | 3 refills | Status: DC

## 2023-03-27 MED ORDER — WARFARIN SODIUM 4 MG PO TABS: 4.0000 mg | ORAL_TABLET | Freq: Every day | ORAL | 2 refills | Status: DC

## 2023-03-27 MED ORDER — ENTRESTO 49-51 MG PO TABS: 1.0000 | ORAL_TABLET | Freq: Two times a day (BID) | ORAL | 3 refills | Status: DC

## 2023-03-27 NOTE — Assessment & Plan Note (Signed)
Referral to orthopedics made and will give topical diclofenac

## 2023-03-27 NOTE — Assessment & Plan Note (Signed)
Status post mitral valve repair continue warfarin

## 2023-03-27 NOTE — Assessment & Plan Note (Signed)
Compensated diastolic heart failure would like cardiology follow-up and evaluation of hypertension

## 2023-03-27 NOTE — Progress Notes (Signed)
New Patient Office Visit  Subjective    Patient ID: Lisa Ayala, female    DOB: Mar 13, 1966  Age: 57 y.o. MRN: [id number]  CC:  Chief Complaint  Patient presents with   Dizziness    HPI 03/2022 Lisa Ayala presents to establish care and for post hospital follow-up This patient was admitted for a prolonged period of time in mid May with documentations as below for the rehab portion of the stay and also recapitulation of the hospital stay prior to the rehab.  This patient has significant mitral tricuspid and aortic valve disease and will need replacements of all the valves along with a Maze procedure and atrial appendage clipping on the left atrium.  This is all to be done at [facility].  Patient is seen today for primary care to establish.  She did see one of our nurse practitioners in June briefly.  She has since seen cardiology as documented below. Seen by NP nichols 02/2022 Hospital Admission: 02/21/22   Hospital Course: Lisa Ayala was admitted to rehab 02/21/2022 for inpatient therapies to consist of PT, ST and OT at least three hours five days a week. Past admission physiatrist, therapy team and rehab RN have worked together to provide customized collaborative inpatient rehab.  Of CBC showed H&H to be relatively stable and leukocytosis had resolved.  She has been afebrile during her stay and respiratory status has been stable.  Mild AKI noted and stable.  Her blood pressures were monitored on TID basis and have been stable.  No cardiac symptoms reported with increase in activity.  No signs of overload noted.  Dapagliflozin was added at discharge per cardiology recommendations.  She continues on trazodone to help manage sleep-wake disruption.  She made great gains during her rehab stay and is modified independent.  No further therapy recommended at this time.  She is to follow-up with cardiology for input on cardiac rehab.  She was discharged to home in improved condition.       Rehab course: During patient's stay in rehab team conference was held to discuss patient's progress, set goals and barriers to discharge. At admission, patient required supervision with mobility and basic ADL tasks. Cognistat was [facility] with exception of one-point below normal for short-term recall with reports of mild memory change. She she is able to complete ADL tasks at modified independent level.  She is modified independent for transfers and is able to ambulate 1400 feet without rest breaks.  She is able to complete mildly complex for functional tasks at modified independent level.  Daughter will provide supervision and assistance as needed after discharge.     HPI   Lisa Ayala is a 57 y.o. female with history of PSVT, severe MR, moderate TR and mild to moderate AI, anxiety disorder, regular marijuana use, cocaine use.    Patient presents today for a hospital follow-up.  Please see hospital notes above.  Patient states that she has been doing well since hospital discharge.  Her daughter is here with her today and is helping care for her.  Patient is ambulating independently.  She states that she was able to get all of her medications at hospital discharge and has been taking them as directed.  Patient does have her follow-up appointments already scheduled.  She will be seeing cardiology next week and she also has an appointment to establish care [facility] end of the month.  Patient  also has an appointment scheduled with cardiothoracic surgery for further evaluation and possible work-up for mitral valve replacement. Denies f/c/s, n/v/d, hemoptysis, PND, leg swelling Denies chest pain or edema   Hosp DC 01/2022 Admit date: 02/21/2022 Discharge date: 02/24/2022   Discharge Diagnoses:  Principal Problem:   Anoxic brain injury [facility]) Active Problems:   Severe mitral regurgitation   Chronic diastolic heart failure [facility])     Discharged  Condition: stable     Brief HPI:   Lisa Ayala is a 57 y.o. female with history of PSVT, severe MR, moderate TR and mild to moderate AI, anxiety disorder, regular marijuana use who was admitted on 02/07/2022 via cardiology office with reports of tachycardia and rapid breathing with SOB.  She was found to have SVT with a flutter, was treated with Nicole Kindred sent and started on IV heparin with plans for TEE.  She did develop hypoxia with acute on chronic diastolic heart failure, demand ischemia and found to have transaminitis as well as UDS positive for THC and cocaine.  She was treated with IV diuresis and started on amiodarone due to ongoing issues with a flutter.   She did have worsening of respiratory status prior to TEE requiring intubation and pressors but deteriorated to PEA arrest requiring CPR and epi x1 minute with ROSC.  Hospital course significant for issues with agitation, SVT with heart rates up to 200 requiring emergent DCCV on 05/19 and 05/20 as well as fevers due to PNA requiring Zosyn.  Dr. Dorris Fetch was consulted for input and recommended MVR once medically optimized.  She tolerated extubation on 05/23 and was placed on regular diet.  PT/OT evaluation completed and patient noted to have weakness with balance deficits.  CIR was recommended due to functional decline.     Hospital Course: Lisa Ayala was admitted to rehab 02/21/2022 for inpatient therapies to consist of PT, ST and OT at least three hours five days a week. Past admission physiatrist, therapy team and rehab RN have worked together to provide customized collaborative inpatient rehab.  Of CBC showed H&H to be relatively stable and leukocytosis had resolved.  She has been afebrile during her stay and respiratory status has been stable.  Mild AKI noted and stable.  Her blood pressures were monitored on TID basis and have been stable.  No cardiac symptoms reported with increase in activity.  No signs of overload noted.   Dapagliflozin was added at discharge per cardiology recommendations.  She continues on trazodone to help manage sleep-wake disruption.  She made great gains during her rehab stay and is modified independent.  No further therapy recommended at this time.  She is to follow-up with cardiology for input on cardiac rehab.  She was discharged to home in improved condition.      Rehab course: During patient's stay in rehab team conference was held to discuss patient's progress, set goals and barriers to discharge. At admission, patient required supervision with mobility and basic ADL tasks. Cognistat was [facility] with exception of one-point below normal for short-term recall with reports of mild memory change. She she is able to complete ADL tasks at modified independent level.  She is modified independent for transfers and is able to ambulate 1400 feet without rest breaks.  She is able to complete mildly complex for functional tasks at modified independent level.  Daughter will provide supervision and assistance as needed after discharge.   Disposition: Home  Below is heart failure clinic documentations just this week Saw  Bensimohn 7/14: Lisa Ayala is a 57 y.o. with a history of SVT and severe MR. Echo 08/2021 LVEF 70-75% with severe MR, mild Lisa, at least Mod TR and planned for TEE   Followed by EP for SVT. She was seen 07/2021. TEE had been planned to evaluate MR. She no showed for TEE.     Admitted 6/23 from EP office with SVT vs AFL, HR 145. UDS + cocaine. Given adenosine 6, 12, and 12 in ED. TEE was scheduled but aborted due to flash pulmonary edema.  AHF team consulted. PEA arrested in Endo. CPR , epi, and bicarb given with ROSC. Urgent intubation. Felt to have component of septic shock 2/2 PNA, as well as CS. Given IV lasix, started on milrinone and NE. Transferred to ICU. Underwent L/RHC showing  nonobstructive CAD. RA 9, PA 50/10, mean PCWP 12, CI 6.8.  Went into rapid SVT, and underwent emergent DCCV.  Extubated, and drips weaned. Amio added for rate control. TCTS planning for MVR with MAZE once recovered from acute illness. Hospitalization c/b AKI, anxiety and hypertension. Required CIR for rehab. Discharged home after CIR, weight 141 lbs.   Her for f/u with her daughter. Feeling fine. Denies CP or SOB. Doing all ADLs without problem. Going to store. No palpitations, CP or SOB. No bleeding with Eliquis. Saw Dr. Zebedee Iba scheduled for triple valve surgery on 8/10.       1. HFpEF - Echo (5/23): EF 65-70%, Grade III DD, R mildly reduced, severe MR and rheumatic - Stable NYHA I-II Volume status looks good - Continue Farxiga 10 mg daily - Continue spiro 12.5 mg daily - Continue Entresto 24/26 mg bid. - Continue Toprol XL 12.5 mg daily.   2.  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  - Planning surgery on 05/04/22 with Dr. Zebedee Iba   3 . PAF/SVT - A flutter on admit. Spontaneously converted to SR.  - Multiple episodes of AF during hospitalization. - In NSR - Continue Eliquis 5 mg bid. No bleeding issues. - Continue amiodarone 200 mg bid. She does not do well out of rhythm. - Planning MAZE at time of MVR.    4. Tobacco/Polysubstance abuse  - UDS on admit + cocaine/THC - She is off smoking    Below is documentation from the [facility] clinic visit previously his surgery scheduled for August 10 Has MVR/AVR scheduled 8/10 at [facility]  1. Continue your medical care with your established primary care provider and/or cardiologist.  2. Contact Dr. Thea Silversmith office at Phone: [phone] and Fax: [phone] directly with any additional questions or concerns.  3. Dr. Zebedee Iba has recommended proceeding with CT surgical procedure to include aortic valve replacement/mitral valve replacement/possible tricuspid valve repair or replacement/MAZE and placement left  atrial appendage clip.  4. Future date of surgery scheduled Dr. Zebedee Iba: 05/04/2022.  5. Take last dose of Eliquis on 04/28/2022  6. Dr. Thea Silversmith office will arrange for you to be admitted to [facility] 05/03/2022 for initiation of IV heparin blood thinner preoperatively.  7. You have been provided preoperative chlorhexidine skin wipes and associated instructions for use to begin at the house on the night of 05/02/2022.    Note the patient has significantly poor dentition and this has yet to be assessed.  Patient has no other complaints at this time.  On arrival blood pressure  elevated 155/102.  She is very stressed.  She is worried over her surgery.  Note blood pressures were normal at cardiology earlier this week.  Patient is compliant with all her medications.  Patient is accompanied by her daughter who goes with her to all her medical visits.  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  The aortic valve was not replaced.  She has rheumatic heart disease.  Discharge summary is as documented below.  She is now on warfarin and off Eliquis.  She had history of atrial fibrillation.  Rate is controlled at this time.  Patient is on warfarin last week her INR was 2.0 by on arrival INR today is 1.6 on 2 mg daily of warfarin. Patient still quite weak and is ambulating with a rollator.  She was told by the surgeons at [facility] did not start physical therapy as of yet.  She has a postop appointment tomorrow.  Patient is yet to reestablish with cardiology at the heart failure clinic.  She has all her medications from her  hospitalization.  Her wounds are improved.  She still has sutures in her chest tube sites but likely be removed tomorrow at the surgery clinic visit.  Patient is accompanied by her daughter Lisa Ayala who has been with her for all her visits and stayed with her in the hospital.  She is a Set designer at [employer].  She has been very much an advocate for her mother.  Note patient had all her teeth removed prior to the mitral valve replacement.  She would like to get dentures for the teeth.  There are no other complaints at this visit.  Below is the discharge summary Admit Date: 05/03/2022  Discharge Date: 05/12/2022  Admitting Physician: Collene Schlichter, MD   Discharge Physician: Collene Schlichter, MD   Primary Care Physician: Storm Frisk, MD  Referring Physician: Charlynn Grimes*  Admission Diagnoses:  Aortic valve insufficiency, etiology of cardiac valve disease unspecified [I35.1] Mitral valve insufficiency, unspecified etiology [I34.0] Tricuspid valve insufficiency, unspecified etiology [I07.1] Atrial fibrillation, unspecified type (CMS-HCC) [I48.91]  Discharge Diagnoses:  Principal Problem: S/P MVR (mitral valve repair) Active Problems: Rheumatic tricuspid valve regurgitation Aortic valve insufficiency, etiology of cardiac valve disease unspecified S/P tricuspid valve repair S/P Maze operation for atrial fibrillation S/P left atrial appendage ligation Acute post-operative pain Leucocytosis Acute blood loss as cause of postoperative anemia Atrial flutter (CMS-HCC) Resolved Problems: Severe mitral regurgitation Postoperative complete heart block (CMS-HCC)   Surgeries Performed: Mechanical MVR (33 mm On-X), Triscupid ring, MAZE, and LA appendage clip 05/04/22  Brief History of Present Illness: Per admission H&P dated 05/03/2022: "Lisa Ayala is a 57 y.o. African American female former-smoker with PMH of SVT, HTN, and Afib/flutter. Patient has  been followed by EP for that last year for SVT. In May 2023 patient presented with rapid Aflutter and was admitted. At the time her urine drug screen was positive for cocaine and THC. Patient was scheduled for TEE/DCCV but developed respiratory distress/pulmonary edema requiring intubation. This was further c/b PEA arrest with ACLS for 1 minute until she achieved ROSC. TEE confirmed severe MR and cardiac cath  noted non obstructive CAD. Remainder of her hospital course was notable for recurrent SVT requiring DCCV and delirium. She was discharged to SNF after 2 week stay and ultimately returned home. Given her severe MR she was referred to Dr. Zebedee Iba who recommended proceeding with surgical intervention. Patient is being admitted to [facility] for medical optimization prior to surgery on 05/04/2022.   Of note, patient had all teeth removed at August 3. No complications. Patient cleared by dentist today.   Last dose Eliquis: 8/3."  Past Medical History:  Diagnosis Date  Atrial fibrillation (CMS-HCC)  histoery of Cardiogenic shock 04/03/2022  Hypertension  S/P left atrial appendage ligation 05/05/2022  S/P Maze operation for atrial fibrillation 05/05/2022  S/P MVR (mitral valve repair) 05/05/2022  S/P tricuspid valve repair 05/05/2022  Supraventricular tachycardia (CMS-HCC)   Past Surgical History:  Procedure Laterality Date  REPLACEMENT MITRAL VALVE N/A 05/04/2022  Procedure: REPLACEMENT, MITRAL VALVE, STERNOTOMY, WITH CARDIOPULMONARY BYPASS; Surgeon: Collene Schlichter, MD; Location: [facility] OPERATING ROOMS; Service: Cardiothoracic; Laterality: N/A;  REPAIR TRICUSPID VALVE Right 05/04/2022  Procedure: VALVULOPLASTY, TRICUSPID VALVE; WITH RING INSERTION; Surgeon: Collene Schlichter, MD; Location: [facility] OPERATING ROOMS; Service: Cardiothoracic; Laterality: Right;  OPERATIVE TISSUE ABLATION AND RECONSTRUCTION ATRIA WITH CARDIOPULMONARY BYPASS N/A 05/04/2022  Procedure: OPERATIVE TISSUE ABLATION AND RECONSTRUCTION  OF ATRIA, EXTENSIVE (EG, MAZE PROCEDURE); WITH CARDIOPULMONARY BYPASS; Surgeon: Collene Schlichter, MD; Location: [facility] OPERATING ROOMS; Service: Cardiothoracic; Laterality: N/A;  EXCLUSION LEFT ATRIAL APPENDAGE OPEN ANY METHOD N/A 05/04/2022  Procedure: Exclusion of left atrial appendage, open, any method (eg, excision, isolation via stapling, oversewing, ligation, plication, clip); Surgeon: Collene Schlichter, MD; Location: [facility] OPERATING ROOMS; Service: Cardiothoracic; Laterality: N/A;  Hx: Left thoracotomy for decortication evacuation of edema due to complication of pneumonia  at age 78   Family History  Problem Relation Age of Onset  No Known Problems Mother  No Known Problems Father   Social History   Socioeconomic History  Marital status: Divorced  Tobacco Use  Smoking status: Former  Types: Cigars  Quit date: 01/2022  Years since quitting: 0.2  Passive exposure: Past (Pt reports many family memebers smoked)  Smokeless tobacco: Never  Vaping Use  Vaping Use: Never used  Substance and Sexual Activity  Alcohol use: Not Currently  Drug use: Yes  Types: "Crack" cocaine, Marijuana  Comment: edibles taken 2 days ago; QHS  Sexual activity: Never   Social Determinants of Health   Transportation Needs: No Transportation Needs  Lack of Transportation (Medical): No  Lack of Transportation (Non-Medical): No   No Known Allergies Prior to Admission Medications  Prescriptions Last Dose Taking?  AMIOdarone (PACERONE) 200 MG tablet 05/03/2022 Yes  Sig: Take 200 mg by mouth 2 (two) times daily  QUEtiapine (SEROQUEL) 25 MG tablet 05/02/2022 Yes  Sig: Take 25 mg by mouth at bedtime  apixaban (ELIQUIS) 5 mg tablet 04/27/2022 No  Sig: Take 5 mg by mouth 2 (two) times daily  dapagliflozin propanediol (FARXIGA) 10 mg tablet 04/30/2022 No  Sig: Take 10 mg by mouth once daily  docusate (COLACE) 50 mg/5 mL liquid 05/03/2022 Yes  Sig: Take 50 mg by mouth 2 (two) times daily  metoprolol succinate  (TOPROL-XL) 25 MG XL tablet 05/02/2022 Yes  Sig: Take 12.5 mg by mouth once daily  multivitamin capsule Past Month Yes  Sig: Take 1 capsule by mouth once daily Women's One a Day multivitamin  pantoprazole (PROTONIX) 40 MG DR tablet 05/03/2022 Yes  Sig: Take 40 mg by mouth once daily  polyethylene glycol (  MIRALAX) powder 05/03/2022 Yes  Sig: Take by mouth as needed  sacubitriL-valsartan (ENTRESTO) 24-26 mg tablet 05/03/2022 Yes  Sig: Take 1 tablet by mouth 2 (two) times daily  spironolactone (ALDACTONE) 25 MG tablet 05/03/2022 Yes  Sig: Take 25 mg by mouth once daily Take 1/2 tablet daily (12.5 mg).  traZODone (DESYREL) 100 MG tablet 05/02/2022 Yes  Sig: Take 100 mg by mouth at bedtime   Facility-Administered Medications: None   Hospital Course: Lisa Ayala is a 57 y.o. female who admitted to [facility] to undergo surgical intervention for her cardiothoracic diagnosis of MR/TR, atrial fibrillation/flutter. The patient was taken to the operating room by Collene Schlichter, MD, who performed the above procedure(s). Overall, the patient tolerated the procedure well without significant difficulty or evident complication, and was transferred to the cardiothoracic surgical intensive care unit. In the immediate postoperative period, she continued to do progress well. Ultimately, she was transferred to the step-down unit for the remainder of her hospital course.   The hospital course was significant for the following:. 1: Arrhythmia: rhythm was CHB immediately post operatively, but converted to NSR. She also had a 14 second ventricular stand still after pacing wires were removed. Echo did not show any effusion. BB was held. She remained in NSR and hemodynamically stable at discharge.   Rhythm at the time of discharge was normal sinus rhythm.   After discussion with the case manager, patient and family; the decision was made to have patient continue rehabilitation at a skilled nursing  facility. Once follow-up arrangements were confirmed, the patient was felt suitable for discharge to home on 05/12/2022.  Discharge Disposition: Home  Discharge Exam (taken from progress note on 05/11/2022):  BP 115/79 (BP Location: Right upper arm, Patient Position: Sitting)  Pulse 86  Temp 36.6 C (97.9 F) (Oral)  Resp 16  Ht 170.2 cm (5' 7.01")  Wt 71.8 kg (158 lb 4.8 oz)  SpO2 93%  BMI 24.79 kg/m  Neurologic: alert and oriented x4, moves all extremities, and follows commands Cardiac: atrial fibrillation, irregular rate and rhythm, Respiratory: normal WOB on RA Abdomen: soft, nontender, and nondistended Extremities: pulses 2+ bilaterally, upper and lower extremities and no cyanosis, clubbing, BLE +2 edema  Incisions: midsternal incision clean, dry and intact, bilateral groins CDI  Integumentary: warm, dry, normal turgor   03/27/23 Patient seen and return follow-up last seen in 2023.  Patient was seen by PA Mcclung in February of this year.  Patient with history of severe mitral regurgitation status post mitral valve replacement with diastolic heart failure.  Patient's not been followed by cardiology in about a year.  She gets frequent INR checks last INR therapeutic.  She complains of right shoulder pain which has been chronic.  On arrival blood pressure is 144/91.  She needs follow-up blood work.  She needs a fecal occult.  Outpatient Encounter Medications as of 03/27/2023  Medication Sig   aspirin 81 MG chewable tablet Chew 1 tablet (81 mg total) by mouth daily.   [DISCONTINUED] dapagliflozin propanediol (FARXIGA) 10 MG TABS tablet Take 1 tablet (10 mg total) by mouth daily.   [DISCONTINUED] sacubitril-valsartan (ENTRESTO) 49-51 MG Take 1 tablet by mouth 2 (two) times daily.   [DISCONTINUED] warfarin (COUMADIN) 4 MG tablet Take 1 tablet (4 mg total) by mouth daily.   dapagliflozin propanediol (FARXIGA) 10 MG TABS tablet Take 1 tablet (10 mg total) by mouth daily.   Multiple Vitamin  (MULTIVITAMIN ADULT PO) Take 1 tablet by mouth daily.  sacubitril-valsartan (ENTRESTO) 49-51 MG Take 1 tablet by mouth 2 (two) times daily.   warfarin (COUMADIN) 4 MG tablet Take 1 tablet (4 mg total) by mouth daily.   [DISCONTINUED] magnesium oxide (MAG-OX) 400 MG tablet Take 1 tablet (400 mg total) by mouth 2 (two) times daily. (Patient not taking: Reported on 10/25/2022)   No facility-administered encounter medications on file as of 03/27/2023.    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 08/2021 Echo: EF 70-75%, no rwma, mod dil LV, GrII DD, nl RV fxn, massively dil LA, mild-mod dil RA, severe MR due to lack of central coaptation of MV leaflets. Mild Lisa. Mod TR. Mild-mod AI.   Tricuspid regurgitation     Past Surgical History:  Procedure Laterality Date   DILATION AND CURETTAGE OF UTERUS     RIGHT/LEFT HEART CATH AND CORONARY ANGIOGRAPHY N/A 02/10/2022   Procedure: RIGHT/LEFT HEART CATH AND CORONARY ANGIOGRAPHY;  Surgeon: Dolores Patty, MD;  Location: [facility] INVASIVE CV LAB;  Service: Cardiovascular;  Laterality: N/A;   TEE WITHOUT CARDIOVERSION N/A 02/08/2022   Procedure: TRANSESOPHAGEAL ECHOCARDIOGRAM (TEE);  Surgeon: Sande Rives, MD;  Location: [facility] ENDOSCOPY;  Service: Cardiovascular;  Laterality: N/A;   TOOTH EXTRACTION N/A 04/27/2022   Procedure: DENTAL RESTORATION/EXTRACTIONS;  Surgeon: Sharman Cheek, DMD;  Location: [facility] OR;  Service: Dentistry;  Laterality: N/A;    Family History  Family history unknown: Yes    Social History   Socioeconomic History   Marital status: Divorced    Spouse name: Not on file   Number of children: Not on file   Years of education: Not on file   Highest education level: Not on file   Occupational History   Not on file  Tobacco Use   Smoking status: Never   Smokeless tobacco: Never  Vaping Use   Vaping Use: Never used  Substance and Sexual Activity   Alcohol use: Not Currently    Comment: Occasional   Drug use: Not Currently    Types: Marijuana, Cocaine, Other-see comments    Comment: No cocaine since March. Edible Marijuana for sleep- nightly   Sexual activity: Not Currently  Other Topics Concern   Not on file  Social History Narrative   Lives locally.  Relatively active.   Social Determinants of Health   Financial Resource Strain: High Risk (03/20/2022)   Overall Financial Resource Strain (CARDIA)    Difficulty of Paying Living Expenses: Very hard  Food Insecurity: Food Insecurity Present (03/06/2022)   Hunger Vital Sign    Worried About Running Out of Food in the Last Year: Sometimes true    Ran Out of Food in the Last Year: Sometimes true  Transportation Needs: Unmet Transportation Needs (03/20/2022)   PRAPARE - Administrator, Civil Service (Medical): Yes    Lack of Transportation (Non-Medical): Yes  Physical Activity: Not on file  Stress: Not on file  Social Connections: Not on file  Intimate Partner Violence: Not on file    Review of Systems  Constitutional:  Negative for chills, diaphoresis, fever, malaise/fatigue and weight loss.  HENT:  Negative for congestion, hearing loss, nosebleeds, sore throat and tinnitus.   Eyes:  Negative for blurred vision, photophobia and redness.  Respiratory:  Negative  for cough, hemoptysis, sputum production, shortness of breath, wheezing and stridor.   Cardiovascular:  Negative for chest pain, palpitations, orthopnea, claudication, leg swelling and PND.  Gastrointestinal:  Negative for abdominal pain, blood in stool, constipation, diarrhea, heartburn, nausea and vomiting.  Genitourinary:  Negative for dysuria, flank pain, frequency, hematuria and urgency.  Musculoskeletal:  Positive for joint pain.  Negative for back pain, falls, myalgias and neck pain.  Skin:  Negative for itching and rash.  Neurological:  Negative for dizziness, tingling, tremors, sensory change, speech change, focal weakness, seizures, loss of consciousness, weakness and headaches.  Endo/Heme/Allergies:  Negative for environmental allergies and polydipsia. Does not bruise/bleed easily.  Psychiatric/Behavioral:  Negative for depression, memory loss, substance abuse and suicidal ideas. The patient is not nervous/anxious and does not have insomnia.         Objective    BP (!) 144/91 (BP Location: Left Arm, Patient Position: Sitting, Cuff Size: Normal)   Pulse 88   Wt 200 lb (90.7 kg)   SpO2 99%   BMI 32.28 kg/m   Physical Exam Vitals reviewed.  Constitutional:      Appearance: Normal appearance. She is well-developed. She is not diaphoretic.  HENT:     Head: Normocephalic and atraumatic.     Nose: Nose normal. No nasal deformity, septal deviation, mucosal edema or rhinorrhea.     Right Sinus: No maxillary sinus tenderness or frontal sinus tenderness.     Left Sinus: No maxillary sinus tenderness or frontal sinus tenderness.     Mouth/Throat:     Mouth: Mucous membranes are moist.     Pharynx: No oropharyngeal exudate.     Comments: Patient is completely edentulous and the dental extraction sites have healed well Eyes:     General: No scleral icterus.    Conjunctiva/sclera: Conjunctivae normal.     Pupils: Pupils are equal, round, and reactive to light.  Neck:     Thyroid: No thyromegaly.     Vascular: No carotid bruit or JVD.     Trachea: Trachea normal. No tracheal tenderness or tracheal deviation.  Cardiovascular:     Rate and Rhythm: Normal rate and regular rhythm.     Chest Wall: PMI is not displaced.     Pulses: Normal pulses. No decreased pulses.     Heart sounds: S1 normal and S2 normal. Heart sounds not distant. No murmur heard.    No systolic murmur is present.     No diastolic murmur is  present.     No friction rub. No gallop. No S3 or S4 sounds.     Comments: Murmur of artificial valve mitral position Wound is completely healed on the sternum Pulmonary:     Effort: Pulmonary effort is normal. No tachypnea, accessory muscle usage or respiratory distress.     Breath sounds: Normal breath sounds. No stridor. No decreased breath sounds, wheezing, rhonchi or rales.  Chest:     Chest wall: No tenderness.  Abdominal:     General: Bowel sounds are normal. There is no distension.     Palpations: Abdomen is soft. Abdomen is not rigid.     Tenderness: There is no abdominal tenderness. There is no guarding or rebound.  Musculoskeletal:        General: Tenderness present.     Cervical back: Normal range of motion and neck supple. No edema, erythema or rigidity. No muscular tenderness. Normal range of motion.     Comments: Tender and decreased range of motion right shoulder  Lymphadenopathy:     Head:     Right side of head: No submental or submandibular adenopathy.     Left side of head: No submental or submandibular adenopathy.     Cervical: No cervical adenopathy.  Skin:    General: Skin is warm and dry.     Coloration: Skin is not pale.     Findings: No rash.     Nails: There is no clubbing.  Neurological:     Mental Status: She is alert and oriented to person, place, and time.     Sensory: No sensory deficit.  Psychiatric:        Mood and Affect: Mood normal.        Speech: Speech normal.        Behavior: Behavior normal.        Thought Content: Thought content normal.        Judgment: Judgment normal.        Significant Diagnostic Studies: Cardiac Studies: - R/LHC (5/23): mild non-obs CAD, EF 60-65%, severe MR by echo, no significant v-waves in PCWP tracing, high cardiac output - may be overestimated by Fick     Ost RCA to Prox RCA lesion is 20% stenosed.   Prox RCA to Mid RCA lesion is 40% stenosed.   Mid LAD lesion is 40% stenosed.   The left ventricular  ejection fraction is 55-65% by visual estimate.   Ao = 139/74 LV = 136/12 RA = 9 RV = 46/10 PA = 50/10 (26) PCW = 12 (no significant v waves) Fick cardiac output/index = 12.2/6.8 PVR = 1.8 WU Ao sat = 99% PA sat = 79%, 80% SVC sat = 85%   Plan/Discussion:  CTS consulted for MVR.    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 EXAM: ABDOMEN - 1 VIEW COMPARISON:  Radiograph 01/12/2022 FINDINGS: Feeding 2 with tip in the proximal duodenum. Increasing gaseous distention of the colon. Transverse colon measures 6 cm which is within normal limits. There is gas in the rectum. IMPRESSION: 1. Feeding tube in first portion duodenum. 2. Gas distended colon without evidence obstruction. Electronically Signed   By: Genevive Bi M.D.   On: 02/17/2022 14:25    DG Abd 1 View   Result Date: 02/15/2022 CLINICAL DATA:  Ileus EXAM: ABDOMEN - 1 VIEW COMPARISON:  Portable exam 0820 hours compared to 02/08/2022 FINDINGS: Nonobstructive bowel gas pattern. No bowel dilatation or bowel wall thickening. Improved atelectasis versus consolidation LEFT lower lobe. No urinary tract calcification. Levoconvex thoracolumbar scoliosis. IMPRESSION: No acute abnormalities. Electronically Signed   By: Ulyses Southward M.D.   On: 02/15/2022 08:27    CT HEAD WO CONTRAST ( )   Result Date: 02/15/2022 CLINICAL DATA:  Altered mental status. EXAM: CT HEAD WITHOUT CONTRAST TECHNIQUE: Contiguous axial images were obtained from the base of the skull through the vertex without intravenous contrast. RADIATION DOSE REDUCTION: This exam was performed according to the departmental dose-optimization program which includes automated exposure control, adjustment of the mA and/or  kV according to patient size and/or use of iterative reconstruction technique. COMPARISON:  None Available. FINDINGS: Brain: No evidence of acute infarction, hemorrhage, hydrocephalus, extra-axial collection or mass lesion/mass effect. Focal area of  low density within the left cerebellar hemisphere is identified, image 7/3. This is suspicious for subacute to chronic infarct. Vascular: No hyperdense vessel or unexpected calcification. Skull: Normal. Negative for fracture or focal lesion. Sinuses/Orbits: No acute finding. Other: None. IMPRESSION: 1. No acute intracranial abnormalities. 2. Focal area of low density within the left cerebellar hemisphere is suspicious for subacute to chronic infarct. Electronically Signed   By: Signa Kell M.D.   On: 02/15/2022 13:20    CARDIAC CATHETERIZATION   Result Date: 02/10/2022   Ost RCA to Prox RCA lesion is 20% stenosed.   Prox RCA to Mid RCA lesion is 40% stenosed.   Mid LAD lesion is 40% stenosed.   The left ventricular ejection fraction is 55-65% by visual estimate. Findings: Ao = 139/74 LV = 136/12 RA = 9 RV = 46/10 PA = 50/10 (26) PCW = 12 (no significant v waves) Fick cardiac output/index = 12.2/6.8 PVR = 1.8 WU Ao sat = 99% PA sat = 79%, 80% SVC sat = 85% Assessment: 1. Mild non-obstructive CAD 2. EF 60-65% 3. Severe MR by echo. No significant v-waves in PCWP tracing 4. High cardiac output - may be overestimated by Fick Plan/Discussion: Will plan to wean vent. TCTS consulted for MVR. Arvilla Meres, MD 4:27 PM   DG CHEST PORT 1 VIEW   Result Date: 02/13/2022 CLINICAL DATA:  Endotracheal tube placement. EXAM: PORTABLE CHEST 1 VIEW COMPARISON:  Feb 12, 2022. FINDINGS: Stable cardiomegaly. Endotracheal and nasogastric tubes are in grossly good position. Right internal jugular catheter is in good position. Bibasilar atelectasis or edema is noted with associated pleural effusions. Bony thorax is unremarkable. IMPRESSION: Stable support apparatus. Stable bibasilar  atelectasis or edema is noted with associated pleural effusions. Electronically Signed   By: Lupita Raider M.D.   On: 02/13/2022 09:23    DG CHEST PORT 1 VIEW   Result Date: 02/12/2022 CLINICAL DATA:  ETT assessment EXAM: PORTABLE CHEST 1 VIEW COMPARISON:  Feb 11, 2022 FINDINGS: The ETT terminates 3 cm above the carina. The right central line terminates in the central SVC. The NG tube terminates below today's film. No pneumothorax. Bilateral effusions, right greater than left. Atelectasis underlying the effusions. Mild interstitial prominence without overt edema. No change in the cardiomediastinal silhouette which is partially obscured by transcutaneous pacer leads. IMPRESSION: 1. Support apparatus as above. 2. Right greater than left pleural effusions with underlying atelectasis. 3. Mild pulmonary venous congestion. Electronically Signed   By: Gerome Sam III M.D.   On: 02/12/2022 08:20    DG CHEST PORT 1 VIEW   Result Date: 02/11/2022 CLINICAL DATA:  57 year old female intubated. Cardiogenic shock. EXAM: PORTABLE CHEST 1 VIEW COMPARISON:  Portable chest 02/08/2022 and earlier. FINDINGS: Portable AP semi upright view at 0734 hours. Stable right IJ central line. ETT tip in good position between the clavicles and carina. Enteric tube courses to the abdomen, tip not included. Pacer or resuscitation pads now project over the chest. Ongoing cardiomegaly, cardiac contour raising the possibility of pericardial effusion. Other mediastinal contours are within normal limits. No pneumothorax. Regressed but not resolved diffuse pulmonary interstitial opacity, apical pulmonary vascularity now appears normal. Continued veiling and confluent opacity at both lung bases. IMPRESSION: 1. Satisfactory visible lines and tubes. 2. Cardiomegaly, consider pericardial effusion. 3. Regressed but not resolved pulmonary interstitial opacity, favor regressed pulmonary edema. 4. Continued bibasilar opacity which probably reflects  combination of pleural effusions and lower lobe collapse or consolidation. Electronically Signed   By: Rexene Edison  Margo Aye M.D.   On: 02/11/2022 08:00    DG Chest Port 1 View   Result Date: 02/08/2022 CLINICAL DATA:  Nasogastric tube placement and central line placement EXAM: PORTABLE CHEST 1 VIEW COMPARISON:  Earlier today at 2:41 p.m. FINDINGS: 3:57 p.m. Endotracheal tube terminates 2.2 cm above carina. Nasogastric tube extends beyond the inferior aspect of the film. Right internal jugular line tip is favored to terminate at the high right atrium or cavoatrial junction. Numerous leads and wires project over the chest. Patient is rotated left. Mild cardiomegaly. Probable small bilateral pleural effusions. No pneumothorax. Interstitial and airspace disease is symmetric, lower lung predominant, and progressive. IMPRESSION: Mild position degradation. Right internal jugular line tip is favored to terminate at the high right atrium or cavoatrial junction. No pneumothorax. Nasogastric tube extends beyond the inferior aspect of the film. Progressive interstitial and airspace disease, likely pulmonary edema. Electronically Signed   By: Jeronimo Greaves M.D.   On: 02/08/2022 16:17    DG CHEST PORT 1 VIEW   Result Date: 02/08/2022 CLINICAL DATA:  Shortness of breath EXAM: PORTABLE CHEST 1 VIEW COMPARISON:  02/07/2022 FINDINGS: Endotracheal tube is 3 cm above the carina. Right central line tip at the cavoatrial junction. No pneumothorax. Cardiomegaly. Diffuse bilateral interstitial and airspace opacities. This could reflect edema or infection. No visible effusions or pneumothorax. No acute bony abnormality. IMPRESSION: Cardiomegaly. Diffuse bilateral interstitial and alveolar opacities could reflect edema or infection. Electronically Signed   By: Charlett Nose M.D.   On: 02/08/2022 15:12    DG Chest Port 1 View   Result Date: 02/07/2022 CLINICAL DATA:  Tachycardia. EXAM: PORTABLE CHEST 1 VIEW COMPARISON:  Chest x-ray 07/02/2021.  FINDINGS: Heart is enlarged, unchanged. There are small bilateral pleural effusions. There is some strandy opacities in the lung bases. No pneumothorax or acute fracture. IMPRESSION: 1. Cardiomegaly with small pleural effusions and minimal bibasilar atelectasis/edema. Electronically Signed   By: Darliss Cheney M.D.   On: 02/07/2022 19:45    DG Abd Portable 1V   Result Date: 02/15/2022 CLINICAL DATA:  Feeding tube placement EXAM: PORTABLE ABDOMEN - 1 VIEW COMPARISON:  02/16/2020 FINDINGS: Feeding tube enters the stomach with the tip near the gastric antrum. Normal bowel gas pattern Cardiac enlargement. IMPRESSION: Feeding tube tip gastric antrum. Electronically Signed   By: Marlan Palau M.D.   On: 02/15/2022 13:17    DG Abd Portable 1V   Result Date: 02/08/2022 CLINICAL DATA:  NG tube placement EXAM: PORTABLE ABDOMEN - 1 VIEW COMPARISON:  06/18/2006 FINDINGS: Tip of enteric tube is seen in the antrum of the stomach. Bowel gas pattern is nonspecific. Pelvis is not included. Haziness in the lower lung fields suggest bilateral pleural effusions. Increased markings in the lower lung fields are noted, more so on the right side suggesting asymmetric pulmonary edema or multifocal pneumonia. IMPRESSION: Tip of enteric tube is seen in the antrum of the stomach. Electronically Signed   By: Ernie Avena M.D.   On: 02/08/2022 16:15    ECHOCARDIOGRAM COMPLETE   Result Date: 02/08/2022    ECHOCARDIOGRAM REPORT   Patient Name:   Lisa Ayala [facility] Date of Exam: 02/08/2022 Medical Rec #:  [id number]       Height:       67.0 in Accession #:    [id number]      Weight:       150.2 lb Date of Birth:  08/02/1966        BSA:  1.791 m Patient Age:    56 years        BP:           124/91 mmHg Patient Gender: F               HR:           69 bpm. Exam Location:  Inpatient Procedure: 2D Echo, Cardiac Doppler and Color Doppler                           STAT ECHO Reported to: Dr Jodelle Red on 02/08/2022 5:30:00  PM. Indications:    Mitral valve insufficiency  History:        Patient has prior history of Echocardiogram examinations, most                 recent 08/25/2021. CHF, Mitral Valve Disease; Arrythmias:Atrial                 Flutter.  Sonographer:    Ross Ludwig RDCS (AE) Referring Phys: [id number] Tanna Savoy [facility]  Sonographer Comments: Echo performed with patient supine and on artificial respirator. IMPRESSIONS  1. Left ventricular ejection fraction, by estimation, is 65 to 70%. The left ventricle has normal function. The left ventricle has no regional wall motion abnormalities. There is moderate concentric left ventricular hypertrophy. Left ventricular diastolic parameters are consistent with Grade III diastolic dysfunction (restrictive).  2. Right ventricular systolic function is mildly reduced. The right ventricular size is normal. There is mildly elevated pulmonary artery systolic pressure.  3. Left atrial size was severely dilated.  4. Right atrial size was mildly dilated.  5. A small pericardial effusion is present.  6. 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 Trivial mitral stenosis.  7. Tricuspid valve regurgitation is moderate.  8. The aortic valve is tricuspid. There is mild calcification of the aortic valve. Aortic valve regurgitation is mild.  9. The inferior vena cava is dilated in size with <50% respiratory variability, suggesting right atrial pressure of 15 mmHg. Comparison(s): Prior images reviewed side by side. Conclusion(s)/Recommendation(s): No significant changes from prior--discussed with Dr. Bjorn Pippin. Severe MR. Valves better seen on prior imaging. FINDINGS  Left Ventricle: Left ventricular ejection fraction, by estimation, is 65 to 70%. The left ventricle has normal function. The left ventricle has no regional wall motion abnormalities. The left ventricular internal cavity size was normal in size. There is  moderate concentric left ventricular hypertrophy. Left  ventricular diastolic parameters are consistent with Grade III diastolic dysfunction (restrictive). Right Ventricle: The right ventricular size is normal. Right vetricular wall thickness was not well visualized. Right ventricular systolic function is mildly reduced. There is mildly elevated pulmonary artery systolic pressure. The tricuspid regurgitant velocity is 2.69 m/s, and with an assumed right atrial pressure of 15 mmHg, the estimated right ventricular systolic pressure is 43.9 mmHg. Left Atrium: Left atrial size was severely dilated. Right Atrium: Right atrial size was mildly dilated. Pericardium: A small pericardial effusion is present. Mitral Valve: The mitral valve is rheumatic. There is severe thickening of the mitral valve leaflet(s). Moderately decreased mobility of the mitral valve leaflets. Severe mitral valve regurgitation, with eccentric posteriorly directed jet. Trivial mitral  valve stenosis. MV peak gradient, 4.5 mmHg. The mean mitral valve gradient is 1.0 mmHg. Tricuspid Valve: The tricuspid valve is not well visualized. Tricuspid valve regurgitation is moderate. Aortic Valve: The aortic valve is tricuspid. There is mild calcification of the aortic valve.  Aortic valve regurgitation is mild. Aortic valve mean gradient measures 4.0 mmHg. Aortic valve peak gradient measures 7.8 mmHg. Aortic valve area, by VTI measures 2.10 cm. Pulmonic Valve: The pulmonic valve was not well visualized. Pulmonic valve regurgitation is trivial. No evidence of pulmonic stenosis. Aorta: The aortic arch was not well visualized and the aortic root and ascending aorta are structurally normal, with no evidence of dilitation. Venous: The inferior vena cava is dilated in size with less than 50% respiratory variability, suggesting right atrial pressure of 15 mmHg. IAS/Shunts: The atrial septum is grossly normal.  LEFT VENTRICLE PLAX 2D LVIDd:         3.60 cm     Diastology LVIDs:         2.20 cm     LV e' medial:    5.25 cm/s  LV PW:         1.60 cm     LV E/e' medial:  20.0 LV IVS:        1.70 cm     LV e' lateral:   9.31 cm/s LVOT diam:     1.90 cm     LV E/e' lateral: 11.3 LV SV:         48 LV SV Index:   27 LVOT Area:     2.84 cm  LV Volumes (MOD) LV vol d, MOD A2C: 95.1 ml LV vol d, MOD A4C: 90.8 ml LV vol s, MOD A2C: 41.9 ml LV vol s, MOD A4C: 44.1 ml LV SV MOD A2C:     53.2 ml LV SV MOD A4C:     90.8 ml LV SV MOD BP:      49.0 ml RIGHT VENTRICLE RV Basal diam:  3.30 cm RV S prime:     6.87 cm/s TAPSE (M-mode): 1.2 cm LEFT ATRIUM              Index        RIGHT ATRIUM           Index LA diam:        2.80 cm  1.56 cm/m   RA Area:     19.00 cm LA Vol (A2C):   171.0 ml 95.50 ml/m  RA Volume:   49.00 ml  27.37 ml/m LA Vol (A4C):   119.0 ml 66.46 ml/m LA Biplane Vol: 153.0 ml 85.45 ml/m  AORTIC VALVE AV Area (Vmax):    2.15 cm AV Area (Vmean):   2.05 cm AV Area (VTI):     2.10 cm AV Vmax:           140.00 cm/s AV Vmean:          89.000 cm/s AV VTI:            0.228 m AV Peak Grad:      7.8 mmHg AV Mean Grad:      4.0 mmHg LVOT Vmax:         106.00 cm/s LVOT Vmean:        64.500 cm/s LVOT VTI:          0.169 m LVOT/AV VTI ratio: 0.74  AORTA Ao Root diam: 2.90 cm Ao Asc diam:  3.40 cm MITRAL VALVE                  TRICUSPID VALVE MV Area (PHT): 1.55 cm       TR Peak grad:   28.9 mmHg MV Area VTI:   1.36 cm  TR Vmax:        269.00 cm/s MV Peak grad:  4.5 mmHg MV Mean grad:  1.0 mmHg       SHUNTS MV Vmax:       1.06 m/s       Systemic VTI:  0.17 m MV Vmean:      50.6 cm/s      Systemic Diam: 1.90 cm MV Decel Time: 488 msec MR Peak grad:    100.8 mmHg MR Mean grad:    65.0 mmHg MR Vmax:         502.00 cm/s MR Vmean:        378.0 cm/s MR PISA:         6.28 cm MR PISA Eff ROA: 48 mm MR PISA Radius:  1.00 cm MV E velocity: 105.00 cm/s MV A velocity: 28.30 cm/s MV E/A ratio:  3.71 Jodelle Red MD Electronically signed by Jodelle Red MD Signature Date/Time: 02/08/2022/5:55:39 PM    Final     ECHO TEE    Result Date: 02/21/2022    TRANSESOPHOGEAL ECHO REPORT   Patient Name:   Lisa Ayala [facility] Date of Exam: 02/09/2022 Medical Rec #:  [id number]       Height:       67.0 in Accession #:    [id number]      Weight:       148.6 lb Date of Birth:  [birth year]        BSA:          1.782 m Patient Age:    56 years        BP:           140/73 mmHg Patient Gender: F               HR:           67 bpm. Exam Location:  Inpatient Procedure: 2D Echo, Color Doppler and Cardiac Doppler Indications:     Mitral Regurgitation  History:         Patient has prior history of Echocardiogram examinations, most                  recent 02/08/2022. Arrythmias:Atrial Flutter.  Sonographer:     Irving Burton Senior RDCS Referring Phys:  [phone] AMY D CLEGG Diagnosing Phys: Arvilla Meres MD PROCEDURE: After discussion of the risks and benefits of a TEE, an informed consent was obtained from the patient. The transesophogeal probe was passed without difficulty through the esophogus of the patient. Sedation performed by performing physician. The patient developed no complications during the procedure. IMPRESSIONS  1. Left ventricular ejection fraction, by estimation, is 60 to 65%. The left ventricle has normal function. The left ventricle has no regional wall motion abnormalities.  2. Right ventricular systolic function is mildly reduced. The right ventricular size is normal.  3. Left atrial size was severely dilated. No left atrial/left atrial appendage thrombus was detected.  4. A small pericardial effusion is present. The pericardial effusion is localized near the right atrium and anterior to the right ventricle.  5. 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  6. The aortic valve is tricuspid. Aortic valve regurgitation is mild.  7. There is mild (Grade II) plaque involving the descending aorta. FINDINGS  Left Ventricle: Left ventricular ejection fraction, by estimation, is 60 to 65%. The left ventricle has normal function. The left  ventricle has no regional wall motion abnormalities. The left ventricular internal cavity size was normal  in size. Right Ventricle: The right ventricular size is normal. No increase in right ventricular wall thickness. Right ventricular systolic function is mildly reduced. Left Atrium: Left atrial size was severely dilated. No left atrial/left atrial appendage thrombus was detected. Right Atrium: Right atrial size was normal in size. Pericardium: A small pericardial effusion is present. The pericardial effusion is localized near the right atrium and anterior to the right ventricle. Mitral Valve: The mitral valve is rheumatic. There is moderate thickening of the mitral valve leaflet(s). Moderately decreased mobility of the mitral valve leaflets. Severe mitral valve regurgitation, with centrally-directed jet. Tricuspid Valve: The tricuspid valve is normal in structure. Tricuspid valve regurgitation is mild. Aortic Valve: The aortic valve is tricuspid. Aortic valve regurgitation is mild. Pulmonic Valve: The pulmonic valve was grossly normal. Pulmonic valve regurgitation is not visualized. Aorta: The aortic root is normal in size and structure. There is mild (Grade II) plaque involving the descending aorta. IAS/Shunts: No atrial level shunt detected by color flow Doppler.  MR Peak grad:    219.0 mmHg MR Mean grad:    103.0 mmHg MR Vmax:         740.00 cm/s MR Vmean:        455.0 cm/s MR PISA:         3.08 cm MR PISA Eff ROA: 16 mm MR PISA Radius:  0.70 cm Arvilla Meres MD Electronically signed by Arvilla Meres MD Signature Date/Time: 02/21/2022/10:34:17 AM    Final     VAS US DOPPLER PRE CABG   Result Date: 02/15/2022 PREOPERATIVE VASCULAR EVALUATION Patient Name:  Lisa Ayala [facility]  Date of Exam:   02/15/2022 Medical Rec #: [id number]        Accession #:    [id number] Date of Birth: 1965-11-30         Patient Gender: F Patient Age:   51 years Exam Location:  [facility] Procedure:      VAS US DOPPLER PRE  CABG Referring Phys: Viviann Spare HENDRICKSON --------------------------------------------------------------------------------  Indications:      Pre-CABG. Other Factors:    MV disease. Limitations:      Right bandaging/line Comparison Study: No prior study Performing Technologist: Gertie Fey MHA, RDMS, RVT, RDCS  Examination Guidelines: A complete evaluation includes B-mode imaging, spectral Doppler, color Doppler, and power Doppler as needed of all accessible portions of each vessel. Bilateral testing is considered an integral part of a complete examination. Limited examinations for reoccurring indications may be performed as noted.  Right Carotid Findings: +----------+--------+--------+--------+--------+--------+           PSV cm/sEDV cm/sStenosisDescribeComments +----------+--------+--------+--------+--------+--------+ CCA Prox  57      13                               +----------+--------+--------+--------+--------+--------+ ICA Prox  62      11                               +----------+--------+--------+--------+--------+--------+ ICA Distal63      16                               +----------+--------+--------+--------+--------+--------+ +----------+--------+-------+----------------+------------+           PSV cm/sEDV cmsDescribe        Arm Pressure +----------+--------+-------+----------------+------------+ Subclavian120  Multiphasic, WNL             +----------+--------+-------+----------------+------------+ +---------+--------+---+--------+--+---------+ VertebralPSV cm/s103EDV cm/s17Antegrade +---------+--------+---+--------+--+---------+ Left Carotid Findings: +----------+--------+-------+--------+----------------------+------------------+           PSV cm/sEDV    StenosisDescribe              Comments                             cm/s                                                     +----------+--------+-------+--------+----------------------+------------------+ CCA Prox  117     16                                                      +----------+--------+-------+--------+----------------------+------------------+ CCA Distal81      22                                   intimal thickening +----------+--------+-------+--------+----------------------+------------------+ ICA Prox  47      12             heterogenous and                                                          smooth                                   +----------+--------+-------+--------+----------------------+------------------+ ICA Distal62      24                                                      +----------+--------+-------+--------+----------------------+------------------+ ECA       55      8                                                       +----------+--------+-------+--------+----------------------+------------------+ +----------+--------+--------+----------------+------------+ SubclavianPSV cm/sEDV cm/sDescribe        Arm Pressure +----------+--------+--------+----------------+------------+           163             Multiphasic, WNL             +----------+--------+--------+----------------+------------+ +---------+--------+--+--------+--+---------+ VertebralPSV cm/s53EDV cm/s19Antegrade +---------+--------+--+--------+--+---------+  ABI Findings: +--------+------------------+-----+---------+--------+ Right   Rt Pressure (mmHg)IndexWaveform Comment  +--------+------------------+-----+---------+--------+ Brachial                       triphasic         +--------+------------------+-----+---------+--------+ +--------+------------------+-----+---------+-------+ Left  Lt Pressure (mmHg)IndexWaveform Comment +--------+------------------+-----+---------+-------+ [id number]                    triphasic         +--------+------------------+-----+---------+-------+  Right Doppler Findings: +-----------+--------+-----+---------+-----------------------------------------+ Site       PressureIndexDoppler  Comments                                  +-----------+--------+-----+---------+-----------------------------------------+ Brachial                triphasic                                          +-----------+--------+-----+---------+-----------------------------------------+ Radial                  triphasic                                          +-----------+--------+-----+---------+-----------------------------------------+ Ulnar                   triphasic                                          +-----------+--------+-----+---------+-----------------------------------------+ Palmar Arch                      Signal obliterates with radial                                             compression, is unaffected with ulnar                                      comperssion                               +-----------+--------+-----+---------+-----------------------------------------+  Left Doppler Findings: +-----------+--------+-----+---------+--------------------+ Site       PressureIndexDoppler  Comments             +-----------+--------+-----+---------+--------------------+ Brachial   125          triphasic                     +-----------+--------+-----+---------+--------------------+ Radial                  triphasic                     +-----------+--------+-----+---------+--------------------+ Ulnar                   triphasic                     +-----------+--------+-----+---------+--------------------+ Palmar Arch                      Within normal limits +-----------+--------+-----+---------+--------------------+  Summary: Right Carotid: Velocities in the right ICA are consistent with a 1-39% stenosis.  Limited visualization  secondary to bandaging. Left Carotid: Velocities in the left ICA are consistent with a 1-39% stenosis. Vertebrals:  Bilateral vertebral arteries demonstrate antegrade flow. Subclavians: Normal flow hemodynamics were seen in bilateral subclavian              arteries.  Electronically signed by Coral Else MD on 02/15/2022 at 8:52:49 PM.    Final     VAS [place] UPPER EXTREMITY VENOUS DUPLEX   Result Date: 02/15/2022 UPPER VENOUS STUDY  Patient Name:  Lisa Ayala [facility]  Date of Exam:   02/15/2022 Medical Rec #: [id number]        Accession #:    [id number] Date of Birth: [birth year]         Patient Gender: F Patient Age:   59 years Exam Location:  [facility] Procedure:      VAS [place] UPPER EXTREMITY VENOUS DUPLEX Referring Phys: Levon Hedger --------------------------------------------------------------------------------  Indications: Edema Comparison Study: No prior study Performing Technologist: Gertie Fey MHA, RDMS, RVT, RDCS  Examination Guidelines: A complete evaluation includes B-mode imaging, spectral Doppler, color Doppler, and power Doppler as needed of all accessible portions of each vessel. Bilateral testing is considered an integral part of a complete examination. Limited examinations for reoccurring indications may be performed as noted.  Right Findings: +----------+------------+---------+-----------+----------+------------+ RIGHT     CompressiblePhasicitySpontaneousProperties  Summary    +----------+------------+---------+-----------+----------+------------+ IJV                                Yes              line present +----------+------------+---------+-----------+----------+------------+ Subclavian    Full       Yes       Yes                           +----------+------------+---------+-----------+----------+------------+ Axillary      Full       Yes       Yes                           +----------+------------+---------+-----------+----------+------------+ Brachial       Full       Yes       Yes                           +----------+------------+---------+-----------+----------+------------+ Radial        Full                                               +----------+------------+---------+-----------+----------+------------+ Ulnar         Full                                               +----------+------------+---------+-----------+----------+------------+ Cephalic      Full                                               +----------+------------+---------+-----------+----------+------------+ Basilic  None                                     Acute     +----------+------------+---------+-----------+----------+------------+  Left Findings: +----------+------------+---------+-----------+----------+-------+ LEFT      CompressiblePhasicitySpontaneousPropertiesSummary +----------+------------+---------+-----------+----------+-------+ Subclavian               Yes       Yes                      +----------+------------+---------+-----------+----------+-------+  Summary:  Right: No evidence of deep vein thrombosis in the upper extremity. Findings consistent with acute superficial vein thrombosis involving the right basilic vein.  Left: No evidence of thrombosis in the subclavian.  *See table(s) above for measurements and observations.  Diagnosing physician: Coral Else MD Electronically signed by Coral Else MD on 02/15/2022 at 8:52:39 PM.    Final     US Abdomen Limited RUQ (LIVER/GB)   Result Date: 02/17/2022 CLINICAL DATA:  Cholecystitis. EXAM: ULTRASOUND ABDOMEN LIMITED RIGHT UPPER QUADRANT COMPARISON:  None Available. FINDINGS: Gallbladder: No gallstones or wall thickening visualized. No sonographic Murphy sign noted by sonographer. Common bile duct: Diameter: 6 mm. Upper limits of normal. No intrahepatic bile duct dilatation. Liver: No focal lesion identified. Within normal limits in parenchymal echogenicity. Portal  vein is patent on color Doppler imaging with normal direction of blood flow towards the liver. Other: None. IMPRESSION: 1. No acute findings. No evidence for cholecystitis. 2. Common bile duct is upper limits of normal measuring 6 mm. Electronically Signed   By: Signa Kell M.D.   On: 02/17/2022 15:13     Assessment & Plan:   Problem List Items Addressed This Visit       Cardiovascular and Mediastinum   Chronic diastolic heart failure (HCC)    Compensated diastolic heart failure would like cardiology follow-up and evaluation of hypertension      Relevant Medications   dapagliflozin propanediol (FARXIGA) 10 MG TABS tablet   sacubitril-valsartan (ENTRESTO) 49-51 MG   warfarin (COUMADIN) 4 MG tablet   Other Relevant Orders   BMP8+eGFR   AMB referral to CHF clinic     Other   Long term current use of anticoagulant therapy - Primary   Relevant Orders   CBC with Differential/Platelet   S/P MVR (mitral valve repair)    Status post mitral valve repair continue warfarin      Relevant Medications   warfarin (COUMADIN) 4 MG tablet   Other Relevant Orders   BMP8+eGFR   AMB referral to CHF clinic   Other Visit Diagnoses     Colon cancer screening       Relevant Orders   Fecal occult blood, imunochemical   Encounter for screening mammogram for malignant neoplasm of breast       Relevant Orders   MM DIGITAL SCREENING BILATERAL   Chronic right shoulder pain       Relevant Orders   Ambulatory referral to Orthopedic Surgery     See Franky Macho our clinical pharmacist 1 week for repeat INR Return in about 5 months (around 08/27/2023) for primary care follow up, chronic conditions.   Shan Levans, MD

## 2023-03-27 NOTE — Patient Instructions (Signed)
Labs today Referral back to heart failure clinic Ref to orthopedics Mammogram ordered Return Dr Delford Field 5 months

## 2023-03-28 ENCOUNTER — Telehealth: Payer: Self-pay

## 2023-03-28 LAB — CBC WITH DIFFERENTIAL/PLATELET
Basophils Absolute: 0.1 10*3/uL (ref 0.0–0.2)
Basos: 1 %
EOS (ABSOLUTE): 0.2 10*3/uL (ref 0.0–0.4)
Eos: 3 %
Hematocrit: 47.6 % — ABNORMAL HIGH (ref 34.0–46.6)
Hemoglobin: 15.3 g/dL (ref 11.1–15.9)
Immature Grans (Abs): 0 10*3/uL (ref 0.0–0.1)
Immature Granulocytes: 0 %
Lymphocytes Absolute: 1.6 10*3/uL (ref 0.7–3.1)
Lymphs: 27 %
MCH: 30.7 pg (ref 26.6–33.0)
MCHC: 32.1 g/dL (ref 31.5–35.7)
MCV: 96 fL (ref 79–97)
Monocytes Absolute: 0.6 10*3/uL (ref 0.1–0.9)
Monocytes: 9 %
Neutrophils Absolute: 3.6 10*3/uL (ref 1.4–7.0)
Neutrophils: 60 %
Platelets: 312 10*3/uL (ref 150–450)
RBC: 4.98 x10E6/uL (ref 3.77–5.28)
RDW: 12.3 % (ref 11.7–15.4)
WBC: 6 10*3/uL (ref 3.4–10.8)

## 2023-03-28 LAB — BMP8+EGFR
BUN/Creatinine Ratio: 14 (ref 9–23)
BUN: 16 mg/dL (ref 6–24)
CO2: 23 mmol/L (ref 20–29)
Calcium: 9.8 mg/dL (ref 8.7–10.2)
Chloride: 105 mmol/L (ref 96–106)
Creatinine, Ser: 1.17 mg/dL — ABNORMAL HIGH (ref 0.57–1.00)
Glucose: 90 mg/dL (ref 70–99)
Potassium: 4.3 mmol/L (ref 3.5–5.2)
Sodium: 144 mmol/L (ref 134–144)
eGFR: 54 mL/min/{1.73_m2} — ABNORMAL LOW (ref >59)

## 2023-03-28 NOTE — Progress Notes (Signed)
Let pt know blood counts and kidney stable no medication change

## 2023-03-28 NOTE — Telephone Encounter (Signed)
-----   Message from Storm Frisk, MD sent at 03/28/2023  5:43 AM EDT ----- Let pt know blood counts and kidney stable no medication change

## 2023-03-28 NOTE — Telephone Encounter (Signed)
Pt was called and is aware of results, DOB was confirmed.  ?

## 2023-04-10 ENCOUNTER — Ambulatory Visit: Payer: Self-pay | Admitting: Pharmacist

## 2023-04-20 ENCOUNTER — Ambulatory Visit: Payer: Medicaid Other | Attending: Critical Care Medicine | Admitting: Pharmacist

## 2023-04-20 DIAGNOSIS — Z9889 Other specified postprocedural states: Secondary | ICD-10-CM | POA: Diagnosis not present

## 2023-04-20 LAB — POCT INR: POC INR: 1.2

## 2023-04-27 ENCOUNTER — Ambulatory Visit: Payer: Medicaid Other | Attending: Critical Care Medicine | Admitting: Pharmacist

## 2023-04-27 ENCOUNTER — Other Ambulatory Visit: Payer: Self-pay

## 2023-04-27 DIAGNOSIS — Z9889 Other specified postprocedural states: Secondary | ICD-10-CM | POA: Diagnosis not present

## 2023-04-27 LAB — POCT INR: POC INR: 1.6

## 2023-05-01 ENCOUNTER — Ambulatory Visit: Payer: Medicaid Other | Admitting: Orthopaedic Surgery

## 2023-05-02 ENCOUNTER — Telehealth: Payer: Self-pay | Admitting: Critical Care Medicine

## 2023-05-02 ENCOUNTER — Other Ambulatory Visit: Payer: Self-pay

## 2023-05-02 ENCOUNTER — Other Ambulatory Visit: Payer: Self-pay | Admitting: Critical Care Medicine

## 2023-05-02 ENCOUNTER — Other Ambulatory Visit: Payer: Self-pay | Admitting: Physician Assistant

## 2023-05-02 DIAGNOSIS — Z9889 Other specified postprocedural states: Secondary | ICD-10-CM

## 2023-05-02 DIAGNOSIS — I5032 Chronic diastolic (congestive) heart failure: Secondary | ICD-10-CM

## 2023-05-02 MED ORDER — WARFARIN SODIUM 4 MG PO TABS
4.0000 mg | ORAL_TABLET | Freq: Every day | ORAL | 1 refills | Status: DC
  Filled 2023-05-02: qty 90, 90d supply, fill #0
  Filled 2023-07-28 – 2023-07-30 (×2): qty 90, 90d supply, fill #1

## 2023-05-02 MED ORDER — ENTRESTO 49-51 MG PO TABS
1.0000 | ORAL_TABLET | Freq: Two times a day (BID) | ORAL | 3 refills | Status: DC
  Filled 2023-05-02 – 2023-07-28 (×2): qty 180, 90d supply, fill #0
  Filled 2023-10-24 – 2024-02-01 (×4): qty 180, 90d supply, fill #1
  Filled 2024-04-29: qty 180, 90d supply, fill #2

## 2023-05-02 MED ORDER — DAPAGLIFLOZIN PROPANEDIOL 10 MG PO TABS
10.0000 mg | ORAL_TABLET | Freq: Every day | ORAL | 3 refills | Status: DC
  Filled 2023-05-02: qty 90, 90d supply, fill #0
  Filled 2023-07-28: qty 90, 90d supply, fill #1
  Filled 2023-10-24: qty 90, 90d supply, fill #2

## 2023-05-02 NOTE — Telephone Encounter (Signed)
Pt's daughter is calling in because she says pt's medications warfarin (COUMADIN) 4 MG tablet [[id number]] dapagliflozin propanediol (FARXIGA) 10 MG TABS tablet [[id number]] were denied for refill and they don't know why. Lisa Ayala says pt recently saw Dr. Delford Field and they weren't told anything about the medication not being able to be filled. Please follow up with pt or Mattania regarding this matter.

## 2023-05-02 NOTE — Telephone Encounter (Signed)
I'm not sure, friend. Rxn resent  to our pharmacy.   [place].   [place],   Is this an issue of insurance not covering? Can we try to run them through here at our pharmacy.

## 2023-05-03 ENCOUNTER — Other Ambulatory Visit: Payer: Self-pay

## 2023-05-04 ENCOUNTER — Other Ambulatory Visit: Payer: Self-pay

## 2023-05-11 ENCOUNTER — Ambulatory Visit: Payer: Medicaid Other | Admitting: Pharmacist

## 2023-05-17 ENCOUNTER — Ambulatory Visit: Payer: Medicaid Other | Attending: Critical Care Medicine | Admitting: Pharmacist

## 2023-05-17 DIAGNOSIS — Z9889 Other specified postprocedural states: Secondary | ICD-10-CM

## 2023-05-17 LAB — POCT INR: POC INR: 3

## 2023-05-18 ENCOUNTER — Encounter (HOSPITAL_COMMUNITY): Payer: Medicaid Other

## 2023-06-25 ENCOUNTER — Ambulatory Visit: Payer: Medicaid Other | Attending: Critical Care Medicine | Admitting: Pharmacist

## 2023-06-25 DIAGNOSIS — Z9889 Other specified postprocedural states: Secondary | ICD-10-CM | POA: Diagnosis not present

## 2023-06-25 LAB — POCT INR: INR: 3.3 — AB (ref 2.0–3.0)

## 2023-07-26 ENCOUNTER — Ambulatory Visit: Payer: Medicaid Other | Admitting: Pharmacist

## 2023-07-30 ENCOUNTER — Other Ambulatory Visit: Payer: Self-pay

## 2023-07-31 ENCOUNTER — Other Ambulatory Visit: Payer: Self-pay

## 2023-08-06 ENCOUNTER — Other Ambulatory Visit: Payer: Self-pay

## 2023-08-17 ENCOUNTER — Telehealth: Payer: Self-pay | Admitting: Critical Care Medicine

## 2023-08-17 ENCOUNTER — Ambulatory Visit: Payer: Medicaid Other | Admitting: Pharmacist

## 2023-08-17 NOTE — Telephone Encounter (Signed)
Pt is calling to schedule an appt with [facility]. Please advise CB- [phone]

## 2023-09-04 ENCOUNTER — Ambulatory Visit: Payer: Medicaid Other | Admitting: Pharmacist

## 2023-09-21 ENCOUNTER — Ambulatory Visit: Payer: Medicaid Other | Attending: Internal Medicine | Admitting: Pharmacist

## 2023-09-21 DIAGNOSIS — Z9889 Other specified postprocedural states: Secondary | ICD-10-CM | POA: Diagnosis not present

## 2023-09-21 LAB — POCT INR: POC INR: 1.5

## 2023-09-25 ENCOUNTER — Telehealth (HOSPITAL_COMMUNITY): Payer: Self-pay | Admitting: Cardiology

## 2023-09-25 NOTE — Telephone Encounter (Signed)
Daughter called to request provider update for my chart access -done  Daughter also called to request letter of recommendation for medical school -will route to provider

## 2023-10-11 ENCOUNTER — Ambulatory Visit: Payer: Medicaid Other | Attending: Critical Care Medicine | Admitting: Pharmacist

## 2023-10-11 DIAGNOSIS — Z9889 Other specified postprocedural states: Secondary | ICD-10-CM

## 2023-10-11 LAB — POCT INR: POC INR: 1.5

## 2023-10-15 ENCOUNTER — Ambulatory Visit: Payer: Medicaid Other | Admitting: Internal Medicine

## 2023-10-18 ENCOUNTER — Ambulatory Visit: Payer: Medicaid Other | Attending: Critical Care Medicine | Admitting: Pharmacist

## 2023-10-18 DIAGNOSIS — Z9889 Other specified postprocedural states: Secondary | ICD-10-CM

## 2023-10-18 LAB — POCT INR: POC INR: 2.8

## 2023-10-22 ENCOUNTER — Other Ambulatory Visit: Payer: Self-pay | Admitting: Critical Care Medicine

## 2023-10-22 DIAGNOSIS — Z9889 Other specified postprocedural states: Secondary | ICD-10-CM

## 2023-10-22 NOTE — Telephone Encounter (Signed)
Copied from CRM [id number]. Topic: Clinical - Medication Refill >> Oct 22, 2023 12:10 PM Prudencio Pair wrote: Most Recent Primary Care Visit:  Provider: Drucilla Chalet  Department[facility]  Visit Type: COUMADIN CLINIC  Date: 10/18/2023  Medication: warfarin (COUMADIN) 4 MG tablet  Has the patient contacted their pharmacy? No, no remaining refills (Agent: If no, request that the patient contact the pharmacy for the refill. If patient does not wish to contact the pharmacy document the reason why and proceed with request.) (Agent: If yes, when and what did the pharmacy advise?)  Is this the correct pharmacy for this prescription? Yes If no, delete pharmacy and type the correct one.  This is the patient's preferred pharmacy:   [facility] [address], [address] [place] [address] Phone: [phone] Fax: [phone]   Has the prescription been filled recently? Yes  Is the patient out of the medication? Yes  Has the patient been seen for an appointment in the last year OR does the patient have an upcoming appointment? Yes  Can we respond through MyChart? Yes  Agent: Please be advised that Rx refills may take up to 3 business days. We ask that you follow-up with your pharmacy.

## 2023-10-23 ENCOUNTER — Other Ambulatory Visit: Payer: Self-pay

## 2023-10-23 MED ORDER — WARFARIN SODIUM 4 MG PO TABS
4.0000 mg | ORAL_TABLET | Freq: Every day | ORAL | 0 refills | Status: DC
  Filled 2023-10-23 – 2023-10-24 (×3): qty 90, 90d supply, fill #0

## 2023-10-24 ENCOUNTER — Other Ambulatory Visit: Payer: Self-pay

## 2023-10-24 ENCOUNTER — Telehealth: Payer: Self-pay

## 2023-10-24 ENCOUNTER — Other Ambulatory Visit (HOSPITAL_COMMUNITY): Payer: Self-pay

## 2023-10-24 ENCOUNTER — Other Ambulatory Visit: Payer: Self-pay | Admitting: Critical Care Medicine

## 2023-10-24 DIAGNOSIS — Z9889 Other specified postprocedural states: Secondary | ICD-10-CM

## 2023-10-24 DIAGNOSIS — I5032 Chronic diastolic (congestive) heart failure: Secondary | ICD-10-CM

## 2023-10-24 MED ORDER — DAPAGLIFLOZIN PROPANEDIOL 10 MG PO TABS
10.0000 mg | ORAL_TABLET | Freq: Every day | ORAL | 0 refills | Status: DC
  Filled 2023-10-24 – 2024-01-26 (×2): qty 30, 30d supply, fill #0

## 2023-10-24 MED ORDER — WARFARIN SODIUM 4 MG PO TABS
4.0000 mg | ORAL_TABLET | Freq: Every day | ORAL | 0 refills | Status: DC
  Filled 2023-10-24 – 2023-10-25 (×2): qty 90, 90d supply, fill #0

## 2023-10-24 NOTE — Telephone Encounter (Signed)
Requested Prescriptions  Pending Prescriptions Disp Refills   warfarin (COUMADIN) 4 MG tablet 90 tablet 0    Sig: Take 1 tablet (4 mg total) by mouth daily.     Hematology:  Anticoagulants - warfarin Failed - 10/24/2023  4:23 PM      Failed - Manual Review: If patient's warfarin is managed by Anti-Coag team, route request to them. If not, route request to the provider.      Failed - INR in normal range and within 30 days    POC INR  Date Value Ref Range Status  10/18/2023 2.8  Final         Failed - HCT in normal range and within 360 days    Hematocrit  Date Value Ref Range Status  03/27/2023 47.6 (H) 34.0 - 46.6 % Final         Failed - Valid encounter within last 3 months    Recent Outpatient Visits           7 months ago S/P MVR (mitral valve repair)   [place] [facility] A Dept Of [place]. [facility] Storm Frisk, MD   11 months ago Chronic diastolic heart failure [facility])   [place] [facility] - A Dept Of [place]. [facility] [place], [place], [place]   1 year ago Chronic diastolic heart failure [facility])   [place] [facility] - A Dept Of [place]. [facility] Storm Frisk, MD   1 year ago Severe mitral regurgitation    [facility] - A Dept Of . [facility] Storm Frisk, MD       Future Appointments             In 1 month Laural Benes Binnie Rail, MD [facility] [place] - A Dept Of Eligha Bridegroom. [facility]            Passed - Patient is not pregnant       dapagliflozin propanediol (FARXIGA) 10 MG TABS tablet 30 tablet     Sig: Take 1 tablet (10 mg total) by mouth daily.     Endocrinology:  Diabetes - SGLT2 Inhibitors Failed - 10/24/2023  4:23 PM      Failed - Cr in normal range and within 360 days    Creatinine, Ser  Date Value Ref Range Status  03/27/2023 1.17 (H) 0.57 - 1.00 mg/dL Final         Failed - HBA1C is between 0 and  7.9 and within 180 days    Hgb A1c MFr Bld  Date Value Ref Range Status  02/07/2022 5.3 4.8 - 5.6 % Final    Comment:    (NOTE) Pre diabetes:          5.7%-6.4%  Diabetes:              >6.4%  Glycemic control for   <7.0% adults with diabetes          Failed - eGFR in normal range and within 360 days    GFR, Estimated  Date Value Ref Range Status  10/25/2022 46 (L) >60 mL/min Final    Comment:    (NOTE) Calculated using the CKD-EPI Creatinine Equation (2021)    eGFR  Date Value Ref Range Status  03/27/2023 54 (L) >59 mL/min/1.73 Final         Failed - Valid encounter within last 6 months  Recent Outpatient Visits           7 months ago S/P MVR (mitral valve repair)   [place] [facility] - A Dept Of [place]. [facility] Storm Frisk, MD   11 months ago Chronic diastolic heart failure [facility])   [place] [facility] - A Dept Of [place]. [facility] [place], [place], [place]   1 year ago Chronic diastolic heart failure [facility])   [place] [facility] - A Dept Of [place]. [facility] Storm Frisk, MD   1 year ago Severe mitral regurgitation   [place] [facility] - A Dept Of [place]. [facility] Storm Frisk, MD       Future Appointments             In 1 month Laural Benes Binnie Rail, MD [facility] [place] - A Dept Of Eligha Bridegroom. [facility]

## 2023-10-24 NOTE — Telephone Encounter (Signed)
Call placed to patient and VM was left inquiring which medication she needs refilled.      Copied from CRM [id number]. Topic: Clinical - Prescription Issue >> Oct 23, 2023  1:01 PM Lisa Ayala wrote: Reason for CRM: Patient is waiting for her medication to be refilled and she states she has not heard anything, would like a call back

## 2023-10-24 NOTE — Telephone Encounter (Signed)
Change in pharmacy requested. Requested Prescriptions  Pending Prescriptions Disp Refills   warfarin (COUMADIN) 4 MG tablet 90 tablet 0    Sig: Take 1 tablet (4 mg total) by mouth daily.     Hematology:  Anticoagulants - warfarin Failed - 10/24/2023  4:32 PM      Failed - Manual Review: If patient's warfarin is managed by Anti-Coag team, route request to them. If not, route request to the provider.      Failed - INR in normal range and within 30 days    POC INR  Date Value Ref Range Status  10/18/2023 2.8  Final         Failed - HCT in normal range and within 360 days    Hematocrit  Date Value Ref Range Status  03/27/2023 47.6 (H) 34.0 - 46.6 % Final         Failed - Valid encounter within last 3 months    Recent Outpatient Visits           7 months ago S/P MVR (mitral valve repair)   [place] [facility] - A Dept Of [place]. [facility] Storm Frisk, MD   11 months ago Chronic diastolic heart failure [facility])   [place] [facility] - A Dept Of [place]. [facility] [place], [place], [place]   1 year ago Chronic diastolic heart failure [facility])   [place] [facility] - A Dept Of [place]. [facility] Storm Frisk, MD   1 year ago Severe mitral regurgitation    [facility] - A Dept Of [place]. [facility] Storm Frisk, MD       Future Appointments             In 1 month Laural Benes Binnie Rail, MD [facility] - A Dept Of Eligha Bridegroom. [facility]            Passed - Patient is not pregnant      Signed Prescriptions Disp Refills   dapagliflozin propanediol (FARXIGA) 10 MG TABS tablet 30 tablet 0    Sig: Take 1 tablet (10 mg total) by mouth daily.     Endocrinology:  Diabetes - SGLT2 Inhibitors Failed - 10/24/2023  4:32 PM      Failed - Cr in normal range and within 360 days    Creatinine, Ser  Date Value Ref Range Status  03/27/2023 1.17 (H)  0.57 - 1.00 mg/dL Final         Failed - HBA1C is between 0 and 7.9 and within 180 days    Hgb A1c MFr Bld  Date Value Ref Range Status  02/07/2022 5.3 4.8 - 5.6 % Final    Comment:    (NOTE) Pre diabetes:          5.7%-6.4%  Diabetes:              >6.4%  Glycemic control for   <7.0% adults with diabetes          Failed - eGFR in normal range and within 360 days    GFR, Estimated  Date Value Ref Range Status  10/25/2022 46 (L) >60 mL/min Final    Comment:    (NOTE) Calculated using the CKD-EPI Creatinine Equation (2021)    eGFR  Date Value Ref Range Status  03/27/2023 54 (L) >59 mL/min/1.73 Final  Failed - Valid encounter within last 6 months    Recent Outpatient Visits           7 months ago S/P MVR (mitral valve repair)   [place] [facility] - A Dept Of [place]. [facility] Storm Frisk, MD   11 months ago Chronic diastolic heart failure [facility])   [place] [facility] - A Dept Of [place]. [facility] [place], [place], [place]   1 year ago Chronic diastolic heart failure [facility])   [place] [facility] - A Dept Of [place]. [facility] Storm Frisk, MD   1 year ago Severe mitral regurgitation    [facility] - A Dept Of . [facility] Storm Frisk, MD       Future Appointments             In 1 month Laural Benes Binnie Rail, MD [facility] [place] - A Dept Of Eligha Bridegroom. [facility]

## 2023-10-24 NOTE — Telephone Encounter (Signed)
Copied from CRM [id number]. Topic: Clinical - Medication Refill >> Oct 24, 2023  3:47 PM Phill Myron wrote: Most Recent Primary Care Visit:  Provider: Drucilla Chalet  Department: [facility]  Visit Type: COUMADIN CLINIC  Date: 10/18/2023  Medication: dapagliflozin propanediol (FARXIGA) 10 MG TABS tablet warfarin (COUMADIN) 4 MG tablet   Has the patient contacted their pharmacy? Yes (Agent: If no, request that the patient contact the pharmacy for the refill. If patient does not wish to contact the pharmacy document the reason why and proceed with request.) (Agent: If yes, when and what did the pharmacy advise?)  Is this the correct pharmacy for this prescription? Yes If no, delete pharmacy and type the correct one.  This is the patient's preferred pharmacy:   [facility] [address] [place] [address] Phone: [phone] Fax: [phone]    Is the patient out of the medication? No  Has the patient been seen for an appointment in the last year OR does the patient have an upcoming appointment? Yes  Can we respond through MyChart? Yes  Agent: Please be advised that Rx refills may take up to 3 business days. We ask that you follow-up with your pharmacy.

## 2023-10-25 ENCOUNTER — Other Ambulatory Visit: Payer: Self-pay

## 2023-10-26 ENCOUNTER — Other Ambulatory Visit (HOSPITAL_COMMUNITY): Payer: Self-pay

## 2023-11-13 ENCOUNTER — Ambulatory Visit: Payer: Medicaid Other | Attending: Family Medicine | Admitting: Pharmacist

## 2023-11-13 DIAGNOSIS — Z9889 Other specified postprocedural states: Secondary | ICD-10-CM

## 2023-11-13 LAB — POCT INR: POC INR: 4.1

## 2023-11-29 ENCOUNTER — Other Ambulatory Visit (HOSPITAL_COMMUNITY): Payer: Self-pay

## 2023-11-29 ENCOUNTER — Ambulatory Visit: Payer: Medicaid Other | Attending: Internal Medicine | Admitting: Internal Medicine

## 2023-11-29 VITALS — BP 143/90 | HR 96 | Temp 98.0°F | Wt 211.0 lb

## 2023-11-29 DIAGNOSIS — I10 Essential (primary) hypertension: Secondary | ICD-10-CM

## 2023-11-29 DIAGNOSIS — Z6834 Body mass index (BMI) 34.0-34.9, adult: Secondary | ICD-10-CM

## 2023-11-29 DIAGNOSIS — E6609 Other obesity due to excess calories: Secondary | ICD-10-CM

## 2023-11-29 DIAGNOSIS — Z9889 Other specified postprocedural states: Secondary | ICD-10-CM | POA: Diagnosis not present

## 2023-11-29 DIAGNOSIS — E66811 Obesity, class 1: Secondary | ICD-10-CM

## 2023-11-29 DIAGNOSIS — Z1231 Encounter for screening mammogram for malignant neoplasm of breast: Secondary | ICD-10-CM

## 2023-11-29 DIAGNOSIS — I110 Hypertensive heart disease with heart failure: Secondary | ICD-10-CM | POA: Diagnosis not present

## 2023-11-29 DIAGNOSIS — Z1331 Encounter for screening for depression: Secondary | ICD-10-CM

## 2023-11-29 DIAGNOSIS — I480 Paroxysmal atrial fibrillation: Secondary | ICD-10-CM

## 2023-11-29 DIAGNOSIS — Z1211 Encounter for screening for malignant neoplasm of colon: Secondary | ICD-10-CM

## 2023-11-29 DIAGNOSIS — I5032 Chronic diastolic (congestive) heart failure: Secondary | ICD-10-CM | POA: Diagnosis not present

## 2023-11-29 MED ORDER — AMLODIPINE BESYLATE 5 MG PO TABS
5.0000 mg | ORAL_TABLET | Freq: Every day | ORAL | 1 refills | Status: DC
  Filled 2023-11-29: qty 90, 90d supply, fill #0

## 2023-11-29 MED ORDER — WARFARIN SODIUM 4 MG PO TABS
4.0000 mg | ORAL_TABLET | Freq: Every day | ORAL | 0 refills | Status: DC
Start: 2023-11-29 — End: 2023-11-30
  Filled 2023-11-29: qty 90, 90d supply, fill #0

## 2023-11-29 NOTE — Progress Notes (Signed)
 Patient ID: Lisa Ayala, female    DOB: 08/02/1966  MRN: [id number]  CC: Follow-up (Follow-up. /Discuss Warfarin - possible refill needed /Yes to pneumonia vax )   Subjective: Lisa Ayala is a 58 y.o. female who presents for chronic ds management.  Her daughter and toddler grandson are with her.  Previous PCP is Dr. Delford Field who has retired. Her concerns today include:  Patient with his 12-year-old with s/p MVR, chronic diastolic CHF, PSVT, PAF/SVT,  polysubstance use  Discussed the use of AI scribe software for clinical note transcription with the patient, who gave verbal consent to proceed.  History of Present Illness   PAF/CHF/HTN: The patient, with a history of congestive heart failure and atrial fibrillation, presents for a follow-up visit. She reports no recent episodes of heart racing or palpitations, except when feeling anxious. She denies any chest pain, shortness of breath, or leg swelling, and she wears compression socks regularly. She is currently taking Entresto twice daily and [place] 10mg  daily. She takes warfarin, but not aspirin. -The patient also mentions a change in her warfarin dosage, which is now adjusted to 8mg  three times a week and 4mg  for the remaining days. She is concerned about running out of her medication before her next refill.  She is followed by the clinical pharmacist for INR and Coumadin adjustment.  Obesity: She expresses concern about her increasing weight and is interested in strategies for weight loss.  PHQ-9 was positive today.  Did not elaborate on what may be contributing.  No thoughts of self-harm.    HM: She defers on pneumonia vaccine.  Declines flu and Shingrix.  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 30 tablet 0   Multiple Vitamin (MULTIVITAMIN ADULT PO) Take 1 tablet by mouth daily.     sacubitril-valsartan (ENTRESTO) 49-51 MG Take 1 tablet by mouth 2 (two) times daily. 180 tablet 3   No current facility-administered medications on file prior to visit.    No Known Allergies  Social History   Socioeconomic History   Marital status: Divorced    Spouse name: Not on file   Number of children: Not on file   Years of education: Not on file   Highest education level: Not on file  Occupational History   Not on file  Tobacco Use   Smoking status: Never   Smokeless tobacco: Never  Vaping Use   Vaping status: Never Used  Substance and Sexual Activity   Alcohol use: Not Currently    Comment: Occasional   Drug use: Not Currently    Types: Marijuana, Cocaine, Other-see comments    Comment: No cocaine since March. Edible Marijuana for sleep- nightly   Sexual activity: Not Currently  Other Topics Concern   Not on file  Social History Narrative   Lives locally.  Relatively active.   Social Drivers of Dispensing optician  Resource Strain: High Risk (11/29/2023)   Overall Financial Resource Strain (CARDIA)    Difficulty of Paying Living Expenses: Very hard  Food Insecurity: Food Insecurity Present (11/29/2023)   Hunger Vital Sign    Worried About Running Out of Food in the Last Year: Sometimes true    Ran Out of Food in the Last Year: Sometimes true  Transportation Needs: Unmet Transportation Needs (05/16/2022)   Received from [facility]   PRAPARE - Transportation  Physical Activity: Insufficiently Active  (11/29/2023)   Exercise Vital Sign    Days of Exercise per Week: 3 days    Minutes of Exercise per Session: 20 min  Stress: Stress Concern Present (11/29/2023)   [employer] of Occupational Health - Occupational Stress Questionnaire    Feeling of Stress : Rather much  Social Connections: Socially Isolated (11/29/2023)   Social Connection and Isolation Panel [NHANES]    Frequency of Communication with Friends and Family: Twice a week    Frequency of Social Gatherings with Friends and Family: Twice a week    Attends Religious Services: Never    Database administrator or Organizations: No    Attends Banker Meetings: Never    Marital Status: Divorced  Catering manager Violence: Not At Risk (11/29/2023)   Humiliation, Afraid, Rape, and Kick questionnaire    Fear of Current or Ex-Partner: No    Emotionally Abused: No    Physically Abused: No    Sexually Abused: No    Family History  Family history unknown: Yes    Past Surgical History:  Procedure Laterality Date   DILATION AND CURETTAGE OF UTERUS     RIGHT/LEFT HEART CATH AND CORONARY ANGIOGRAPHY N/A 02/10/2022   Procedure: RIGHT/LEFT HEART CATH AND CORONARY ANGIOGRAPHY;  Surgeon: Dolores Patty, MD;  Location: [facility] INVASIVE CV LAB;  Service: Cardiovascular;  Laterality: N/A;   TEE WITHOUT CARDIOVERSION N/A 02/08/2022   Procedure: TRANSESOPHAGEAL ECHOCARDIOGRAM (TEE);  Surgeon: Sande Rives, MD;  Location: [facility] ENDOSCOPY;  Service: Cardiovascular;  Laterality: N/A;   TOOTH EXTRACTION N/A 04/27/2022   Procedure: DENTAL RESTORATION/EXTRACTIONS;  Surgeon: Sharman Cheek, DMD;  Location: [facility] OR;  Service: Dentistry;  Laterality: N/A;    ROS: Review of Systems Negative except as stated above  PHYSICAL EXAM: BP (!) 143/90   Pulse 96   Temp 98 F (36.7 C) (Oral)   Wt 211 lb (95.7 kg)   SpO2 100%   BMI 34.06 kg/m   Physical Exam  General appearance - alert, well appearing, and in no distress Mental status  - normal mood, behavior, speech, dress, motor activity, and thought processes Chest - clear to auscultation, no wheezes, rales or rhonchi, symmetric air entry Heart -clicking of mechanical valve can be heard. Extremities - peripheral pulses normal, no pedal edema, no clubbing or cyanosis     11/29/2023    3:53 PM 03/27/2023    2:27 PM 11/08/2022    2:06 PM  Depression screen PHQ 2/9  Decreased Interest 2 0 0  Down, Depressed, Hopeless 3 1 0  PHQ - 2 Score 5 1 0  Altered sleeping 1 0 0  Tired, decreased energy 2 3 1   Change in appetite 3 2 1   Feeling bad or failure about yourself  3 1 0  Trouble concentrating 0 2 0  Moving slowly or fidgety/restless 0 0 0  Suicidal thoughts 0 0 0  PHQ-9 Score 14 9 2   Difficult doing work/chores Very difficult  Latest Ref Rng & Units 11/29/2023    5:01 PM 03/27/2023    3:31 PM 10/25/2022    9:52 AM  CMP  Glucose 70 - 99 mg/dL 161  90  096   BUN 6 - 24 mg/dL 14  16  12    Creatinine 0.57 - 1.00 mg/dL 0.45  4.09  8.11   Sodium 134 - 144 mmol/L 143  144  140   Potassium 3.5 - 5.2 mmol/L 4.4  4.3  4.2   Chloride 96 - 106 mmol/L 105  105  105   CO2 20 - 29 mmol/L 21  23  24    Calcium 8.7 - 10.2 mg/dL 9.9  9.8  9.2   Total Protein 6.0 - 8.5 g/dL 7.8     Total Bilirubin 0.0 - 1.2 mg/dL 0.7     Alkaline Phos 44 - 121 IU/L 194     AST 0 - 40 IU/L 26     ALT 0 - 32 IU/L 29      Lipid Panel     Component Value Date/Time   CHOL 276 (H) 11/29/2023 1701   TRIG 159 (H) 11/29/2023 1701   HDL 58 11/29/2023 1701   CHOLHDL 4.8 (H) 11/29/2023 1701   LDLCALC 189 (H) 11/29/2023 1701    CBC    Component Value Date/Time   WBC 7.6 11/29/2023 1701   WBC 7.0 07/10/2022 1007   RBC 5.06 11/29/2023 1701   RBC 4.67 07/10/2022 1007   HGB 15.1 11/29/2023 1701   HCT 44.9 11/29/2023 1701   PLT 370 11/29/2023 1701   MCV 89 11/29/2023 1701   MCH 29.8 11/29/2023 1701   MCH 23.8 (L) 07/10/2022 1007   MCHC 33.6 11/29/2023 1701   MCHC 29.1 (L) 07/10/2022 1007    RDW 12.8 11/29/2023 1701   LYMPHSABS 1.6 03/27/2023 1531   MONOABS 0.7 02/22/2022 0507   EOSABS 0.2 03/27/2023 1531   BASOSABS 0.1 03/27/2023 1531    ASSESSMENT AND PLAN: 1. Essential hypertension (Primary) Not at goal.  Patient to continue Entresto 29/51 mg twice a day.  Add amlodipine 5 mg daily.  2. Chronic diastolic heart failure (HCC) Stable and compensated.  Continue Entresto and [place]. - Comprehensive metabolic panel - Lipid panel  3. S/P MVR (mitral valve repair)   4. Class 1 obesity due to excess calories with serious comorbidity and body mass index (BMI) of 34.0 to 34.9 in adult Patient advised to eliminate sugary drinks from the diet, cut back on portion sizes especially of white carbohydrates, eat more white lean meat like chicken [place] and seafood instead of beef or pork and incorporate fresh fruits and vegetables into the diet daily.   5. PAF (paroxysmal atrial fibrillation) (HCC) Patient on warfarin and followed by clinical pharmacist.  Refill sent on warfarin.  She reports no bruising or bleeding at this time - CBC  6. Encounter for screening mammogram for malignant neoplasm of breast Order submitted for mammogram.  7. Screening for colon cancer Patient prefers Cologuard rather than colonoscopy.  Cologuard ordered.  8. Positive depression screening Patient does not feel she needs medicine but is agreeable to counseling.        Patient was given the opportunity to ask questions.  Patient verbalized understanding of the plan and was able to repeat key elements of the plan.   This documentation was completed using Paediatric nurse.  Any transcriptional errors are unintentional.  Orders Placed This Encounter  Procedures   MM 3D SCREENING  MAMMOGRAM BILATERAL BREAST   CBC   Comprehensive metabolic panel   Lipid panel   Cologuard   Ambulatory referral to Psychiatry     Requested Prescriptions    No prescriptions requested or  ordered in this encounter    Return in about 6 weeks (around 01/10/2024) for PAP.  Jonah Blue, MD, FACP

## 2023-11-30 ENCOUNTER — Encounter: Payer: Self-pay | Admitting: Internal Medicine

## 2023-11-30 ENCOUNTER — Other Ambulatory Visit: Payer: Self-pay | Admitting: Internal Medicine

## 2023-11-30 ENCOUNTER — Other Ambulatory Visit: Payer: Self-pay

## 2023-11-30 ENCOUNTER — Other Ambulatory Visit (HOSPITAL_COMMUNITY): Payer: Self-pay

## 2023-11-30 DIAGNOSIS — Z9889 Other specified postprocedural states: Secondary | ICD-10-CM

## 2023-11-30 DIAGNOSIS — I10 Essential (primary) hypertension: Secondary | ICD-10-CM

## 2023-11-30 LAB — COMPREHENSIVE METABOLIC PANEL
ALT: 29 IU/L (ref 0–32)
AST: 26 IU/L (ref 0–40)
Albumin: 4.5 g/dL (ref 3.8–4.9)
Alkaline Phosphatase: 194 IU/L — ABNORMAL HIGH (ref 44–121)
BUN/Creatinine Ratio: 11 (ref 9–23)
BUN: 14 mg/dL (ref 6–24)
Bilirubin Total: 0.7 mg/dL (ref 0.0–1.2)
CO2: 21 mmol/L (ref 20–29)
Calcium: 9.9 mg/dL (ref 8.7–10.2)
Chloride: 105 mmol/L (ref 96–106)
Creatinine, Ser: 1.27 mg/dL — ABNORMAL HIGH (ref 0.57–1.00)
Globulin, Total: 3.3 g/dL (ref 1.5–4.5)
Glucose: 105 mg/dL — ABNORMAL HIGH (ref 70–99)
Potassium: 4.4 mmol/L (ref 3.5–5.2)
Sodium: 143 mmol/L (ref 134–144)
Total Protein: 7.8 g/dL (ref 6.0–8.5)
eGFR: 49 mL/min/{1.73_m2} — ABNORMAL LOW (ref >59)

## 2023-11-30 LAB — CBC
Hematocrit: 44.9 % (ref 34.0–46.6)
Hemoglobin: 15.1 g/dL (ref 11.1–15.9)
MCH: 29.8 pg (ref 26.6–33.0)
MCHC: 33.6 g/dL (ref 31.5–35.7)
MCV: 89 fL (ref 79–97)
Platelets: 370 10*3/uL (ref 150–450)
RBC: 5.06 x10E6/uL (ref 3.77–5.28)
RDW: 12.8 % (ref 11.7–15.4)
WBC: 7.6 10*3/uL (ref 3.4–10.8)

## 2023-11-30 LAB — LIPID PANEL
Chol/HDL Ratio: 4.8 ratio — ABNORMAL HIGH (ref 0.0–4.4)
Cholesterol, Total: 276 mg/dL — ABNORMAL HIGH (ref 100–199)
HDL: 58 mg/dL (ref >39)
LDL Chol Calc (NIH): 189 mg/dL — ABNORMAL HIGH (ref 0–99)
Triglycerides: 159 mg/dL — ABNORMAL HIGH (ref 0–149)
VLDL Cholesterol Cal: 29 mg/dL (ref 5–40)

## 2023-11-30 MED ORDER — ATORVASTATIN CALCIUM 10 MG PO TABS
10.0000 mg | ORAL_TABLET | Freq: Every day | ORAL | 3 refills | Status: DC
  Filled 2023-11-30: qty 90, 90d supply, fill #0

## 2023-11-30 MED ORDER — WARFARIN SODIUM 4 MG PO TABS
ORAL_TABLET | ORAL | 0 refills | Status: DC
  Filled 2023-11-30: qty 90, fill #0
  Filled 2023-12-23: qty 90, 63d supply, fill #0

## 2023-11-30 MED ORDER — AMLODIPINE BESYLATE 5 MG PO TABS
5.0000 mg | ORAL_TABLET | Freq: Every day | ORAL | 1 refills | Status: DC
  Filled 2023-11-30: qty 90, 90d supply, fill #0

## 2023-12-03 ENCOUNTER — Telehealth: Payer: Self-pay | Admitting: Internal Medicine

## 2023-12-03 NOTE — Telephone Encounter (Signed)
 Contacted pt to resch appt due to Franky Macho out of the office on the 20th she was wondering if someone else could do the INR sometime this week maybe Friday ( next ava will be April 10th)

## 2023-12-03 NOTE — Telephone Encounter (Signed)
 Hey friend,   Let me know if you're okay to get this reading. Since I'm already booked up, it'd be difficult for me to see her but I can be available on teams if you want to teams me the result. I could make any recommendations over Teams.

## 2023-12-11 ENCOUNTER — Other Ambulatory Visit: Payer: Self-pay

## 2023-12-13 ENCOUNTER — Telehealth: Payer: Self-pay | Admitting: Internal Medicine

## 2023-12-13 ENCOUNTER — Telehealth: Payer: Self-pay

## 2023-12-13 ENCOUNTER — Ambulatory Visit: Attending: Internal Medicine

## 2023-12-13 ENCOUNTER — Other Ambulatory Visit: Payer: Self-pay | Admitting: Internal Medicine

## 2023-12-13 ENCOUNTER — Ambulatory Visit: Payer: Medicaid Other | Admitting: Pharmacist

## 2023-12-13 DIAGNOSIS — Z7901 Long term (current) use of anticoagulants: Secondary | ICD-10-CM | POA: Diagnosis not present

## 2023-12-13 LAB — POCT INR: INR: 3.4 — AB (ref 2.0–3.0)

## 2023-12-13 NOTE — Telephone Encounter (Signed)
 Patient in today for INR. INR 3.4. Dr.Newlin aware.

## 2023-12-13 NOTE — Telephone Encounter (Signed)
 PC placed to pt. INR today was 3.4.  Usually follows with our clinical pharmacist but he is out of office this week. I reviewed his last note; INR goal for her is 2.5-3.5 since she has a mechanical valve. Advise pt to continue current Coumadin dose of 8 mg Q Tue/Thur and Sat and 4 mg all the other days. Pt expressed understanding.  I will send message to clinical pharmacist when he returns so he can let her know when he wants her to return to next INR chack.

## 2023-12-13 NOTE — Telephone Encounter (Signed)
 Spoke with Provider . Patient instructed per provider of the following "Please have her hold tonight's dose, resume Warfarin tomorrow with the following schedule change- 8mg  on 2 days (Sat and Tuesday) and 4mg  on the other 5 days, return in 2 weeks to have a recheck of her INR. " Patient voiced understanding. Voiced that she really did not want to change her dose. Advised that therapeutic range for Warfarin is 2-3. Patient unable to schedule 2 week appointment for follow-up. will call her on Monday to schedule.

## 2023-12-13 NOTE — Progress Notes (Incomplete Revision)
 Patient in today for INR. INR 3.4.

## 2023-12-13 NOTE — Progress Notes (Addendum)
 Patient in today for INR. INR 3.4.

## 2023-12-14 NOTE — Telephone Encounter (Signed)
 error

## 2023-12-14 NOTE — Telephone Encounter (Signed)
 PCP was out of the office on PAL as well as clinical pharmacist who manages her INR.  I initially instructed to decrease warfarin dose but on review of her chart her INR goal is actually 2.5-3.5 due to MVR.  I reached out to PCP who has contacted patient to correct this and advised against holding any Coumadin doses as she is therapeutic at 3.4 with an MVR.  She will continue this dose until seen by clinical pharmacist.

## 2023-12-17 NOTE — Telephone Encounter (Signed)
 Yes ma'am, noted. Call returned and scheduled patient to return in 3 weeks for r/p INR. Thank you!

## 2023-12-24 ENCOUNTER — Other Ambulatory Visit: Payer: Self-pay

## 2023-12-27 ENCOUNTER — Other Ambulatory Visit: Payer: Self-pay

## 2024-01-10 ENCOUNTER — Ambulatory Visit: Attending: Internal Medicine | Admitting: Pharmacist

## 2024-01-10 DIAGNOSIS — Z9889 Other specified postprocedural states: Secondary | ICD-10-CM

## 2024-01-10 LAB — POCT INR: POC INR: 5.4

## 2024-01-25 ENCOUNTER — Ambulatory Visit: Admitting: Pharmacist

## 2024-01-25 ENCOUNTER — Telehealth: Payer: Self-pay | Admitting: Internal Medicine

## 2024-01-25 NOTE — Telephone Encounter (Signed)
 Called & spoke to the patient. Verified name & DOB. Patient Appointment has been rescheduled to Feb 22, 2024 at 2:30 pm. Patient acknowledged and had no further questions.   Copied from CRM [id number]. Topic: Appointments - Appointment Scheduling >> Jan 25, 2024  2:29 PM Turkey B wrote: Pt called in to reschedule coumadin  appt she has with the pharmacist, Mara Seminole

## 2024-01-26 ENCOUNTER — Other Ambulatory Visit (HOSPITAL_BASED_OUTPATIENT_CLINIC_OR_DEPARTMENT_OTHER): Payer: Self-pay

## 2024-01-26 ENCOUNTER — Other Ambulatory Visit: Payer: Self-pay | Admitting: Internal Medicine

## 2024-01-26 DIAGNOSIS — Z9889 Other specified postprocedural states: Secondary | ICD-10-CM

## 2024-01-28 ENCOUNTER — Other Ambulatory Visit: Payer: Self-pay

## 2024-01-28 MED ORDER — WARFARIN SODIUM 4 MG PO TABS
ORAL_TABLET | ORAL | 2 refills | Status: DC
  Filled 2024-01-28: qty 90, fill #0
  Filled 2024-03-04: qty 90, 63d supply, fill #0
  Filled 2024-05-04: qty 90, 63d supply, fill #1
  Filled 2024-07-06: qty 90, 63d supply, fill #2

## 2024-02-01 ENCOUNTER — Other Ambulatory Visit: Payer: Self-pay

## 2024-02-04 ENCOUNTER — Other Ambulatory Visit: Payer: Self-pay

## 2024-02-05 ENCOUNTER — Other Ambulatory Visit: Payer: Self-pay

## 2024-02-22 ENCOUNTER — Ambulatory Visit: Attending: Internal Medicine | Admitting: Pharmacist

## 2024-02-22 DIAGNOSIS — Z9889 Other specified postprocedural states: Secondary | ICD-10-CM | POA: Diagnosis not present

## 2024-02-22 LAB — POCT INR: POC INR: 3.1

## 2024-03-01 ENCOUNTER — Encounter: Payer: Self-pay | Admitting: Cardiology

## 2024-03-04 ENCOUNTER — Other Ambulatory Visit: Payer: Self-pay

## 2024-03-04 ENCOUNTER — Other Ambulatory Visit: Payer: Self-pay | Admitting: Critical Care Medicine

## 2024-03-04 DIAGNOSIS — I5032 Chronic diastolic (congestive) heart failure: Secondary | ICD-10-CM

## 2024-03-04 MED ORDER — DAPAGLIFLOZIN PROPANEDIOL 10 MG PO TABS
10.0000 mg | ORAL_TABLET | Freq: Every day | ORAL | 0 refills | Status: DC
  Filled 2024-03-04: qty 30, 30d supply, fill #0

## 2024-03-04 NOTE — Telephone Encounter (Signed)
 Please review and advise.

## 2024-03-06 ENCOUNTER — Other Ambulatory Visit (HOSPITAL_COMMUNITY): Payer: Self-pay

## 2024-03-07 ENCOUNTER — Other Ambulatory Visit: Payer: Self-pay

## 2024-03-10 ENCOUNTER — Other Ambulatory Visit: Payer: Self-pay | Admitting: Pharmacist

## 2024-03-10 ENCOUNTER — Other Ambulatory Visit: Payer: Self-pay

## 2024-03-10 DIAGNOSIS — I5032 Chronic diastolic (congestive) heart failure: Secondary | ICD-10-CM

## 2024-03-10 MED ORDER — FARXIGA 10 MG PO TABS
10.0000 mg | ORAL_TABLET | Freq: Every day | ORAL | 2 refills | Status: DC
  Filled 2024-03-10: qty 30, 30d supply, fill #0
  Filled 2024-04-09: qty 30, 30d supply, fill #1
  Filled 2024-05-05: qty 30, 30d supply, fill #2

## 2024-03-11 ENCOUNTER — Other Ambulatory Visit: Payer: Self-pay

## 2024-03-25 ENCOUNTER — Ambulatory Visit: Admitting: Pharmacist

## 2024-04-10 ENCOUNTER — Other Ambulatory Visit: Payer: Self-pay

## 2024-04-21 ENCOUNTER — Telehealth: Payer: Self-pay | Admitting: Internal Medicine

## 2024-04-21 NOTE — Telephone Encounter (Signed)
Confirmed appt for 7/29

## 2024-04-22 ENCOUNTER — Ambulatory Visit: Attending: Internal Medicine | Admitting: Pharmacist

## 2024-04-22 DIAGNOSIS — Z9889 Other specified postprocedural states: Secondary | ICD-10-CM | POA: Diagnosis not present

## 2024-04-22 LAB — POCT INR: POC INR: 5.4

## 2024-04-28 ENCOUNTER — Telehealth: Payer: Self-pay | Admitting: Internal Medicine

## 2024-04-28 NOTE — Telephone Encounter (Signed)
 Called patient to schedule appointment for paperwork, call unanswered left voicemail.

## 2024-04-29 NOTE — Telephone Encounter (Signed)
 Noted. Form left in provider mailbox.

## 2024-04-29 NOTE — Telephone Encounter (Signed)
 Patient called back, appointment is scheduled for 8/21 9:10 AM

## 2024-04-30 ENCOUNTER — Other Ambulatory Visit: Payer: Self-pay

## 2024-05-01 ENCOUNTER — Telehealth: Payer: Self-pay

## 2024-05-01 DIAGNOSIS — I5032 Chronic diastolic (congestive) heart failure: Secondary | ICD-10-CM

## 2024-05-01 DIAGNOSIS — I480 Paroxysmal atrial fibrillation: Secondary | ICD-10-CM

## 2024-05-01 DIAGNOSIS — Z9889 Other specified postprocedural states: Secondary | ICD-10-CM

## 2024-05-01 NOTE — Telephone Encounter (Signed)
 Pt needing referral to cardiology.

## 2024-05-01 NOTE — Telephone Encounter (Signed)
 Copied from CRM [id number]. Topic: Referral - Question >> May 01, 2024 10:51 AM Nathanel BROCKS wrote: Reason for CRM: pt wants the referral for the heart clinic in [facility] not [place]. Please advise with an appt.

## 2024-05-05 NOTE — Addendum Note (Signed)
 Addended by: VICCI SOBER B on: 05/05/2024 06:28 PM   Modules accepted: Orders

## 2024-05-05 NOTE — Telephone Encounter (Signed)
 Referral submitted

## 2024-05-06 ENCOUNTER — Other Ambulatory Visit: Payer: Self-pay

## 2024-05-06 NOTE — Telephone Encounter (Signed)
 Called & spoke to the patient. Verified name & DOB. Informed of cardiology referral submission. Patient expressed verbal understanding.

## 2024-05-08 ENCOUNTER — Telehealth: Payer: Self-pay | Admitting: Internal Medicine

## 2024-05-08 ENCOUNTER — Encounter: Admitting: Internal Medicine

## 2024-05-08 NOTE — Telephone Encounter (Signed)
 Confirmed appt for 8/15

## 2024-05-09 ENCOUNTER — Ambulatory Visit: Attending: Internal Medicine | Admitting: Pharmacist

## 2024-05-09 DIAGNOSIS — Z9889 Other specified postprocedural states: Secondary | ICD-10-CM | POA: Diagnosis not present

## 2024-05-09 LAB — POCT INR: POC INR: 4.4

## 2024-05-14 ENCOUNTER — Telehealth: Payer: Self-pay | Admitting: Internal Medicine

## 2024-05-14 NOTE — Telephone Encounter (Signed)
 Confirmed appt fo 8/21

## 2024-05-15 ENCOUNTER — Ambulatory Visit: Admitting: Internal Medicine

## 2024-05-28 ENCOUNTER — Telehealth: Payer: Self-pay | Admitting: Internal Medicine

## 2024-05-28 NOTE — Telephone Encounter (Signed)
 Voicemail left by volunteer to confirm patient's appointment for 05/29/2024

## 2024-05-29 ENCOUNTER — Ambulatory Visit: Attending: Family Medicine | Admitting: Pharmacist

## 2024-05-29 DIAGNOSIS — Z9889 Other specified postprocedural states: Secondary | ICD-10-CM

## 2024-05-29 LAB — POCT INR: POC INR: 3.1

## 2024-05-30 ENCOUNTER — Telehealth: Payer: Self-pay | Admitting: Internal Medicine

## 2024-05-30 NOTE — Telephone Encounter (Signed)
 Called patient, patient did not answer. Left a detailed voicemail.

## 2024-05-30 NOTE — Telephone Encounter (Signed)
-----   Message from Barnie Louder sent at 05/29/2024 11:29 PM EDT ----- Regarding: Appt Needs f/u appt with me for chronic ds management and completion of FMLA.

## 2024-06-06 ENCOUNTER — Other Ambulatory Visit: Payer: Self-pay | Admitting: Critical Care Medicine

## 2024-06-06 ENCOUNTER — Other Ambulatory Visit: Payer: Self-pay | Admitting: Internal Medicine

## 2024-06-06 DIAGNOSIS — I5032 Chronic diastolic (congestive) heart failure: Secondary | ICD-10-CM

## 2024-06-08 ENCOUNTER — Telehealth: Payer: Self-pay | Admitting: Internal Medicine

## 2024-06-08 MED ORDER — SACUBITRIL-VALSARTAN 49-51 MG PO TABS
1.0000 | ORAL_TABLET | Freq: Two times a day (BID) | ORAL | 1 refills | Status: AC
  Filled 2024-06-08 – 2024-07-22 (×2): qty 180, 90d supply, fill #0
  Filled 2024-10-26: qty 180, 90d supply, fill #1
  Filled 2024-10-30: qty 60, 30d supply, fill #1

## 2024-06-08 MED ORDER — FARXIGA 10 MG PO TABS
10.0000 mg | ORAL_TABLET | Freq: Every day | ORAL | 2 refills | Status: DC
  Filled 2024-06-08: qty 30, 30d supply, fill #0
  Filled 2024-07-03: qty 30, 30d supply, fill #1
  Filled 2024-07-31: qty 30, 30d supply, fill #2

## 2024-06-08 NOTE — Telephone Encounter (Signed)
 Pt over due for f/u appt with me. Please schedule. I am cc Herlene on this as well so that if you are not able to reach her, he can inform her the next time he sees her for anticoagulation monitoring.

## 2024-06-09 ENCOUNTER — Other Ambulatory Visit: Payer: Self-pay

## 2024-06-10 ENCOUNTER — Other Ambulatory Visit: Payer: Self-pay

## 2024-06-30 ENCOUNTER — Encounter (HOSPITAL_COMMUNITY): Admitting: Internal Medicine

## 2024-07-02 ENCOUNTER — Encounter (HOSPITAL_COMMUNITY): Admitting: Internal Medicine

## 2024-07-07 ENCOUNTER — Other Ambulatory Visit: Payer: Self-pay

## 2024-07-08 ENCOUNTER — Ambulatory Visit: Admitting: Pharmacist

## 2024-07-22 ENCOUNTER — Other Ambulatory Visit: Payer: Self-pay

## 2024-07-25 ENCOUNTER — Other Ambulatory Visit: Payer: Self-pay

## 2024-07-31 ENCOUNTER — Ambulatory Visit: Attending: Internal Medicine | Admitting: Pharmacist

## 2024-07-31 ENCOUNTER — Other Ambulatory Visit: Payer: Self-pay

## 2024-07-31 DIAGNOSIS — Z9889 Other specified postprocedural states: Secondary | ICD-10-CM | POA: Diagnosis not present

## 2024-07-31 LAB — POCT INR: POC INR: 3.2

## 2024-08-29 ENCOUNTER — Other Ambulatory Visit: Payer: Self-pay | Admitting: Internal Medicine

## 2024-08-29 ENCOUNTER — Other Ambulatory Visit: Payer: Self-pay

## 2024-08-29 MED ORDER — FARXIGA 10 MG PO TABS
10.0000 mg | ORAL_TABLET | Freq: Every day | ORAL | 0 refills | Status: DC
  Filled 2024-08-29: qty 30, 30d supply, fill #0

## 2024-09-04 ENCOUNTER — Encounter (HOSPITAL_COMMUNITY): Payer: Self-pay | Admitting: Internal Medicine

## 2024-09-04 ENCOUNTER — Ambulatory Visit (HOSPITAL_COMMUNITY)
Admission: RE | Admit: 2024-09-04 | Discharge: 2024-09-04 | Disposition: A | Discharge status: Home / Self Care | Source: Ambulatory Visit | Attending: Internal Medicine | Admitting: Internal Medicine

## 2024-09-04 VITALS — BP 124/80 | HR 91 | Wt 207.0 lb

## 2024-09-04 DIAGNOSIS — I480 Paroxysmal atrial fibrillation: Secondary | ICD-10-CM | POA: Diagnosis not present

## 2024-09-04 DIAGNOSIS — I340 Nonrheumatic mitral (valve) insufficiency: Secondary | ICD-10-CM | POA: Diagnosis not present

## 2024-09-04 DIAGNOSIS — I5032 Chronic diastolic (congestive) heart failure: Secondary | ICD-10-CM | POA: Diagnosis not present

## 2024-09-04 DIAGNOSIS — Z952 Presence of prosthetic heart valve: Secondary | ICD-10-CM | POA: Diagnosis not present

## 2024-09-04 LAB — CBC
HCT: 44.2 % (ref 36.0–46.0)
Hemoglobin: 14.4 g/dL (ref 12.0–15.0)
MCH: 30.5 pg (ref 26.0–34.0)
MCHC: 32.6 g/dL (ref 30.0–36.0)
MCV: 93.6 fL (ref 80.0–100.0)
Platelets: 309 K/uL (ref 150–400)
RBC: 4.72 MIL/uL (ref 3.87–5.11)
RDW: 12.7 % (ref 11.5–15.5)
WBC: 6.4 K/uL (ref 4.0–10.5)
nRBC: 0 % (ref 0.0–0.2)

## 2024-09-04 LAB — BASIC METABOLIC PANEL WITH GFR
Anion gap: 9 (ref 5–15)
BUN: 13 mg/dL (ref 6–20)
CO2: 24 mmol/L (ref 22–32)
Calcium: 9.2 mg/dL (ref 8.9–10.3)
Chloride: 109 mmol/L (ref 98–111)
Creatinine, Ser: 1.25 mg/dL — ABNORMAL HIGH (ref 0.44–1.00)
GFR, Estimated: 50 mL/min — ABNORMAL LOW (ref >60)
Glucose, Bld: 111 mg/dL — ABNORMAL HIGH (ref 70–99)
Potassium: 4.4 mmol/L (ref 3.5–5.1)
Sodium: 142 mmol/L (ref 135–145)

## 2024-09-04 LAB — PROTIME-INR
INR: 2.4 — ABNORMAL HIGH (ref 0.8–1.2)
Prothrombin Time: 27.4 s — ABNORMAL HIGH (ref 11.4–15.2)

## 2024-09-04 LAB — BRAIN NATRIURETIC PEPTIDE: B Natriuretic Peptide: 104.5 pg/mL — ABNORMAL HIGH (ref 0.0–100.0)

## 2024-09-04 NOTE — Addendum Note (Signed)
 Encounter addended by: Tita Andriette NOVAK, RN on: 09/04/2024 10:33 AM  Actions taken: Pend clinical note, Clinical Note Signed, Order list changed, Diagnosis association updated, Charge Capture section accepted

## 2024-09-04 NOTE — Progress Notes (Signed)
 Advanced Heart Failure Clinic Note  PCP: Vicci Barnie NOVAK, MD HF Cardiologist: Dr. Cherrie  HPI: Lisa Ayala is a 58 y.o. with a history of SVT and severe MR. Echo 08/2021 LVEF 70-75% with severe MR, mild Lisa, at least Mod TR and planned for TEE  Admitted 6/23 from EP office with SVT vs AFL, HR 145. UDS + cocaine. Given adenosine  6, 12, and 12 in ED. TEE was scheduled but aborted due to flash pulmonary edema.  AHF team consulted. PEA arrested in Endo. CPR , epi, and bicarb given with ROSC. Urgent intubation. Felt to have component of septic shock 2/2 PNA, as well as CS. Given IV lasix , started on milrinone  and NE. Transferred to ICU. Underwent L/RHC showing  nonobstructive CAD. RA 9, PA 50/10, mean PCWP 12, CI 6.8.  Went into rapid SVT, and underwent emergent DCCV. Extubated, and drips weaned. Amio added for rate control. TCTS planning for MVR with MAZE once recovered from acute illness. Hospitalization c/b AKI, anxiety and hypertension. Required CIR for rehab. Discharged home after CIR, weight 141 lbs.  Underwent mechanical MVR/TV repair + Maze and LAA ligation on at 05/04/22 at [facility]   Follow up 10/23, doing well after MVR/TR repair, stable NYHA I-II. Entresto  increased to 49/51 with elevated BP.  Echo 10/25/22 EF 60-65% RV normal valves ok   She returns for f/u. We have not seen her since 1/24. She is here with her daughter. Feels good. Exercises 2x/day on stationary bike for 1 hour at time/ No CP or  SOB. Frustrated about not losing weight.   Cardiac Studies: - R/LHC (5/23): mild non-obs CAD, EF 60-65%, severe MR by echo, no significant v-waves in PCWP tracing, high cardiac output - may be overestimated by Fick    Ost RCA to Prox RCA lesion is 20% stenosed.   Prox RCA to Mid RCA lesion is 40% stenosed.   Mid LAD lesion is 40% stenosed.   The left ventricular ejection fraction is 55-65% by visual estimate.   Ao = 139/74 LV = 136/12 RA = 9 RV = 46/10 PA = 50/10 (26) PCW = 12 (no  significant v waves) Fick cardiac output/index = 12.2/6.8 PVR = 1.8 WU Ao sat = 99% PA sat = 79%, 80% SVC sat = 85%  Plan/Discussion:  CTS consulted for MVR.   - TEE (5/23, bedside): EF 60-65%, RV mildly reduced, small pericardial effusion, severe rheumatic MR, mild AI  - Echo (5/23): EF 65-70%, moderate LVH, grade III DD, moderate TR, severe MR  - Echo (12/22): EF 70-75%, severe MR  Past Medical History:  Diagnosis Date   Aortic insufficiency    Atrial flutter (HCC) 02/07/2022   GERD (gastroesophageal reflux disease)    Hypertension    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 08/2021 Echo: EF 70-75%, no rwma, mod dil LV, GrII DD, nl RV fxn, massively dil LA, mild-mod dil RA, severe MR due to lack of central coaptation of MV leaflets. Mild Lisa. Mod TR. Mild-mod AI.   Tricuspid regurgitation    Current Outpatient Medications  Medication Sig Dispense Refill   FARXIGA  10 MG TABS tablet Take 1 tablet (10 mg total) by mouth daily before breakfast.Must have office visit for refills 30 tablet 0   Multiple Vitamin (MULTIVITAMIN ADULT PO) Take 1 tablet by mouth daily.     sacubitril -valsartan  (ENTRESTO ) 49-51 MG Take 1 tablet by mouth 2 (two) times  daily. 180 tablet 1   warfarin (COUMADIN ) 4 MG tablet Take 2 tablets (8mg ) by mouth three times per week, and 1 tablet (4mg ) all other days. 90 tablet 2   No current facility-administered medications for this encounter.   No Known Allergies  Social History   Socioeconomic History   Marital status: Divorced    Spouse name: Not on file   Number of children: Not on file   Years of education: Not on file   Highest education level: Not on file  Occupational History   Not on file  Tobacco Use   Smoking status: Never   Smokeless tobacco: Never  Vaping Use   Vaping status: Never Used  Substance and Sexual Activity   Alcohol use: Not Currently     Comment: Occasional   Drug use: Not Currently    Types: Marijuana, Cocaine, Other-see comments    Comment: No cocaine since March. Edible Marijuana for sleep- nightly   Sexual activity: Not Currently  Other Topics Concern   Not on file  Social History Narrative   Lives locally.  Relatively active.   Social Drivers of Health   Tobacco Use: Low Risk (09/04/2024)   Patient History    Smoking Tobacco Use: Never    Smokeless Tobacco Use: Never    Passive Exposure: Not on file  Financial Resource Strain: High Risk (11/29/2023)   Overall Financial Resource Strain (CARDIA)    Difficulty of Paying Living Expenses: Very hard  Food Insecurity: Food Insecurity Present (11/29/2023)   Hunger Vital Sign    Worried About Running Out of Food in the Last Year: Sometimes true    Ran Out of Food in the Last Year: Sometimes true  Transportation Needs: Unmet Transportation Needs (05/16/2022)   Received from [facility]   PRAPARE - Transportation  Physical Activity: Insufficiently Active (11/29/2023)   Exercise Vital Sign    Days of Exercise per Week: 3 days    Minutes of Exercise per Session: 20 min  Stress: Stress Concern Present (11/29/2023)   Lisa Ayala of Occupational Health - Occupational Stress Questionnaire    Feeling of Stress : Rather much  Social Connections: Socially Isolated (11/29/2023)   Social Connection and Isolation Panel    Frequency of Communication with Friends and Family: Twice a week    Frequency of Social Gatherings with Friends and Family: Twice a week    Attends Religious Services: Never    Database Administrator or Organizations: No    Attends Banker Meetings: Never    Marital Status: Divorced  Catering Manager Violence: Not At Risk (11/29/2023)   Humiliation, Afraid, Rape, and Kick questionnaire    Fear of Current or Ex-Partner: No    Emotionally Abused: No    Physically Abused: No    Sexually Abused: No  Depression (PHQ2-9): High Risk  (11/29/2023)   Depression (PHQ2-9)    PHQ-2 Score: 14  Alcohol Screen: Low Risk (11/29/2023)   Alcohol Screen    Last Alcohol Screening Score (AUDIT): 2  Housing: Low Risk (11/29/2023)   Housing Stability Vital Sign    Unable to Pay for Housing in the Last Year: No    Number of Times Moved in the Last Year: 0    Homeless in the Last Year: No  Utilities: Not At Risk (11/29/2023)   AHC Utilities    Threatened with loss of utilities: No  Health Literacy: Adequate Health Literacy (11/29/2023)   B1300 Health Literacy    Frequency  of need for help with medical instructions: Never   Family History  Family history unknown: Yes   BP 124/80   Pulse 91   Wt 93.9 kg (207 lb)   SpO2 99%   BMI 33.41 kg/m   Wt Readings from Last 3 Encounters:  09/04/24 93.9 kg (207 lb)  11/29/23 95.7 kg (211 lb)  03/27/23 90.7 kg (200 lb)   Body mass index is 33.41 kg/m.  PHYSICAL EXAM: General:  Sitting up. No resp difficulty HEENT: normal Neck: supple. no JVD.  Cor: Regular rate & rhythm. Mech s1 2/6 SEM RUSB  Lungs: clear Abdomen: soft, nontender, nondistended.Good bowel sounds. Extremities: no cyanosis, clubbing, rash, edema Neuro: alert & orientedx3, cranial nerves grossly intact. moves all 4 extremities w/o difficulty. Affect pleasant  ECG (personally reviewed): NSR 86 IVC with repol Personally reviewed   ASSESSMENT & PLAN: 1. 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 NYHA I  - Continue Farxiga  10 mg daily. - Continue Entresto  49/51 mg bid. - Off spiro due to hyperkalemia.  - Labs today  2.  Severe MR due to rheumatic heart disease - Echo (5/23): EF 65-70% Grade III DD, severe MR and rheumatic - TEE (5/23): EF 60-65% mild AI severe MR. - Cath (5/23): showed mild nonobstructive CAD.  - Underwent mechanical MVR/TV repair + Maze and LAA ligation on at 05/04/22  at [facility] . - Echo 10/25/22 EF 60-65% RV normal valves ok  - INR followed by PCP, goal 2.5-3.5. - Aware of SBE prophylaxis (edentulous) - Due for repeat echo - Can f/u [facility]  3 . PAF/SVT - Underwent Maze with surgery 05/04/22. - In NSR today on ECG.  - Continue warfarin.   4. Tobacco/Polysubstance abuse  - UDS on admit + cocaine/THC. - Quit tobacco   5. Obesity - Body mass index is 33.41 kg/m. - Will refer to [facility].     Toribio Fuel, MD  10:09 AM

## 2024-09-04 NOTE — Patient Instructions (Signed)
 Medication Changes:  No Changes In Medications at this time.   Lab Work:  Labs done today, your results will be available in MyChart, we will contact you for abnormal readings.  Testing/Procedures:  ECHOCARDIOGRAM AS SCHEDULED   Referrals:  YOU HAVE BEEN REFERRED TO [facility] THEY WILL REACH OUT TO YOU OR CALL TO ARRANGE THIS. PLEASE CALL US  WITH ANY CONCERNS   YOU HAVE BEEN REFERRED TO GENERAL CARDIOLOGY (DR. KATE)  THEY WILL REACH OUT TO YOU OR CALL TO ARRANGE THIS. PLEASE CALL US  WITH ANY CONCERNS   Follow-Up in: AS NEEDED WITH US    At [facility], you and your health needs are our priority. We have a designated team specialized in the treatment of Heart Failure. This Care Team includes your primary Heart Failure Specialized Cardiologist (physician), Advanced Practice Providers (APPs- Physician Assistants and Nurse Practitioners), and Pharmacist who all work together to provide you with the care you need, when you need it.   You may see any of the following providers on your designated Care Team at your next follow up:  Dr. Toribio Fuel Dr. Ezra Shuck Dr. Odis Brownie Greig Mosses, NP Caffie Shed, [place] [facility] [place], [place] Beckey Coe, NP Jordan Lee, NP Tinnie Redman, PharmD   Please be sure to bring in all your medications bottles to every appointment.   Need to Contact Us :  If you have any questions or concerns before your next appointment please send us  a message through [facility] or call our office at [phone].    TO LEAVE A MESSAGE FOR THE NURSE SELECT OPTION 2, PLEASE LEAVE A MESSAGE INCLUDING: YOUR NAME DATE OF BIRTH CALL BACK NUMBER REASON FOR CALL**this is important as we prioritize the call backs  YOU WILL RECEIVE A CALL BACK THE SAME DAY AS LONG AS YOU CALL BEFORE 4:00 PM

## 2024-09-06 ENCOUNTER — Other Ambulatory Visit: Payer: Self-pay | Admitting: Internal Medicine

## 2024-09-06 DIAGNOSIS — Z9889 Other specified postprocedural states: Secondary | ICD-10-CM

## 2024-09-07 MED ORDER — WARFARIN SODIUM 4 MG PO TABS
4.0000 mg | ORAL_TABLET | Freq: Every day | ORAL | 2 refills | Status: AC
  Filled 2024-09-07 – 2024-10-26 (×2): qty 90, 90d supply, fill #0

## 2024-09-08 ENCOUNTER — Other Ambulatory Visit: Payer: Self-pay

## 2024-09-11 ENCOUNTER — Ambulatory Visit: Admitting: Pharmacist

## 2024-09-16 ENCOUNTER — Ambulatory Visit (HOSPITAL_COMMUNITY)
Admission: RE | Admit: 2024-09-16 | Discharge: 2024-09-16 | Disposition: A | Discharge status: Home / Self Care | Source: Ambulatory Visit | Attending: Internal Medicine | Admitting: Internal Medicine

## 2024-09-16 ENCOUNTER — Other Ambulatory Visit: Payer: Self-pay

## 2024-09-16 DIAGNOSIS — Z952 Presence of prosthetic heart valve: Secondary | ICD-10-CM | POA: Diagnosis not present

## 2024-09-16 DIAGNOSIS — I5032 Chronic diastolic (congestive) heart failure: Secondary | ICD-10-CM | POA: Insufficient documentation

## 2024-09-16 DIAGNOSIS — I110 Hypertensive heart disease with heart failure: Secondary | ICD-10-CM | POA: Insufficient documentation

## 2024-09-16 DIAGNOSIS — I340 Nonrheumatic mitral (valve) insufficiency: Secondary | ICD-10-CM | POA: Insufficient documentation

## 2024-09-16 LAB — ECHOCARDIOGRAM COMPLETE
AR max vel: 1.82 cm2
AV Area VTI: 1.68 cm2
AV Area mean vel: 1.89 cm2
AV Mean grad: 5 mmHg
AV Peak grad: 7.6 mmHg
Ao pk vel: 1.38 m/s
Area-P 1/2: 2.72 cm2
Calc EF: 60.4 %
MV VTI: 1.41 cm2
S' Lateral: 2.5 cm
Single Plane A2C EF: 54 %
Single Plane A4C EF: 65.1 %

## 2024-09-17 ENCOUNTER — Other Ambulatory Visit: Payer: Self-pay

## 2024-10-01 ENCOUNTER — Telehealth: Payer: Self-pay | Admitting: Internal Medicine

## 2024-10-01 ENCOUNTER — Other Ambulatory Visit: Payer: Self-pay

## 2024-10-01 ENCOUNTER — Other Ambulatory Visit: Payer: Self-pay | Admitting: Internal Medicine

## 2024-10-01 MED ORDER — FARXIGA 10 MG PO TABS
10.0000 mg | ORAL_TABLET | Freq: Every day | ORAL | 0 refills | Status: DC
  Filled 2024-10-01: qty 30, 30d supply, fill #0

## 2024-10-01 NOTE — Telephone Encounter (Signed)
 Copied from CRM [id number]. Topic: Clinical - Lab/Test Results >> Oct 01, 2024  8:50 AM Viola F wrote:  Reason for CRM: Patient would like call back to discuss the echocardiogram/ECG lead done 09/04/24. She's concerned about the report and feels like someone should of reached out because she sees a finding about blood flow to her heart . Please call her at  [phone]

## 2024-10-01 NOTE — Telephone Encounter (Signed)
 Pt saw her cardiologist Dr. Cherrie last mth and he ordered the echo. Please reach out to his office to discuss results.  She is also over due for f/u appt with me. Almost been a yr. Please schedule f/u appt.

## 2024-10-01 NOTE — Telephone Encounter (Signed)
 Called & spoke to the patient. Verified name & DOB. Informed that Dr.Bensimhon was the ordering physician for the echo and will need to reach out to that office. Informed patient that she if overdue for a follow up appointment with Dr.Johnson. Patient declined to schedule a visit at this time. FYI.

## 2024-10-02 ENCOUNTER — Other Ambulatory Visit: Payer: Self-pay

## 2024-10-02 ENCOUNTER — Ambulatory Visit: Attending: Family Medicine | Admitting: Pharmacist

## 2024-10-02 DIAGNOSIS — Z9889 Other specified postprocedural states: Secondary | ICD-10-CM

## 2024-10-02 LAB — POCT INR: POC INR: 2.9

## 2024-10-26 ENCOUNTER — Other Ambulatory Visit: Payer: Self-pay | Admitting: Internal Medicine

## 2024-10-26 ENCOUNTER — Other Ambulatory Visit: Payer: Self-pay

## 2024-10-27 ENCOUNTER — Other Ambulatory Visit: Payer: Self-pay

## 2024-10-27 MED ORDER — FARXIGA 10 MG PO TABS
10.0000 mg | ORAL_TABLET | Freq: Every day | ORAL | 0 refills | Status: AC
  Filled 2024-10-27: qty 30, 30d supply, fill #0

## 2024-10-28 ENCOUNTER — Ambulatory Visit: Admitting: Cardiology

## 2024-10-30 ENCOUNTER — Other Ambulatory Visit: Payer: Self-pay

## 2024-11-03 ENCOUNTER — Ambulatory Visit: Payer: Self-pay | Admitting: Internal Medicine

## 2024-11-13 ENCOUNTER — Ambulatory Visit: Payer: Self-pay | Admitting: Pharmacist

## 2024-12-04 ENCOUNTER — Ambulatory Visit: Payer: Self-pay | Admitting: Internal Medicine

## 2025-01-09 ENCOUNTER — Ambulatory Visit: Admitting: Cardiology
